# Patient Record
Sex: Male | Born: 1935 | Race: White | Hispanic: No | Marital: Married | Smoking: Former smoker
Health system: Southern US, Community
[De-identification: ages and names within clinical notes are randomized; demographics above are authoritative.]

## PROBLEM LIST (undated history)

## (undated) DIAGNOSIS — C801 Malignant (primary) neoplasm, unspecified: Secondary | ICD-10-CM

## (undated) DIAGNOSIS — R011 Cardiac murmur, unspecified: Secondary | ICD-10-CM

## (undated) DIAGNOSIS — G4730 Sleep apnea, unspecified: Secondary | ICD-10-CM

## (undated) DIAGNOSIS — Z95828 Presence of other vascular implants and grafts: Secondary | ICD-10-CM

## (undated) DIAGNOSIS — I10 Essential (primary) hypertension: Secondary | ICD-10-CM

## (undated) DIAGNOSIS — R1033 Periumbilical pain: Secondary | ICD-10-CM

## (undated) HISTORY — PX: APPENDECTOMY: SHX54

## (undated) HISTORY — DX: Periumbilical pain: R10.33

## (undated) HISTORY — DX: Malignant (primary) neoplasm, unspecified: C80.1

## (undated) HISTORY — DX: Cardiac murmur, unspecified: R01.1

## (undated) HISTORY — PX: SPLENECTOMY, TOTAL: SHX788

## (undated) HISTORY — PX: INGUINAL HERNIA REPAIR: SUR1180

---

## 2001-12-13 ENCOUNTER — Ambulatory Visit (HOSPITAL_COMMUNITY): Admission: RE | Admit: 2001-12-13 | Discharge: 2001-12-13 | Payer: Self-pay | Admitting: Cardiology

## 2002-05-09 ENCOUNTER — Ambulatory Visit (HOSPITAL_COMMUNITY): Admission: RE | Admit: 2002-05-09 | Discharge: 2002-05-09 | Payer: Self-pay | Admitting: Gastroenterology

## 2002-07-25 ENCOUNTER — Encounter: Admission: RE | Admit: 2002-07-25 | Discharge: 2002-07-25 | Payer: Self-pay

## 2004-07-15 ENCOUNTER — Ambulatory Visit (HOSPITAL_COMMUNITY): Admission: RE | Admit: 2004-07-15 | Discharge: 2004-07-15 | Payer: Self-pay | Admitting: Family Medicine

## 2009-01-06 ENCOUNTER — Emergency Department (HOSPITAL_COMMUNITY): Admission: EM | Admit: 2009-01-06 | Discharge: 2009-01-06 | Payer: Self-pay | Admitting: Family Medicine

## 2009-03-06 ENCOUNTER — Emergency Department (HOSPITAL_COMMUNITY): Admission: EM | Admit: 2009-03-06 | Discharge: 2009-03-06 | Payer: Self-pay | Admitting: Family Medicine

## 2010-05-27 ENCOUNTER — Encounter: Admission: RE | Admit: 2010-05-27 | Discharge: 2010-05-27 | Payer: Self-pay | Admitting: Family Medicine

## 2010-05-30 ENCOUNTER — Encounter: Admission: RE | Admit: 2010-05-30 | Discharge: 2010-05-30 | Payer: Self-pay | Admitting: Family Medicine

## 2010-06-25 HISTORY — PX: PANCREATECTOMY: SHX1019

## 2010-11-06 ENCOUNTER — Ambulatory Visit: Payer: Medicare Other | Attending: Radiation Oncology | Admitting: Radiation Oncology

## 2010-11-06 DIAGNOSIS — Z803 Family history of malignant neoplasm of breast: Secondary | ICD-10-CM | POA: Insufficient documentation

## 2010-11-06 DIAGNOSIS — C259 Malignant neoplasm of pancreas, unspecified: Secondary | ICD-10-CM | POA: Insufficient documentation

## 2010-11-06 DIAGNOSIS — I059 Rheumatic mitral valve disease, unspecified: Secondary | ICD-10-CM | POA: Insufficient documentation

## 2010-11-06 DIAGNOSIS — E119 Type 2 diabetes mellitus without complications: Secondary | ICD-10-CM | POA: Insufficient documentation

## 2010-11-06 DIAGNOSIS — Z79899 Other long term (current) drug therapy: Secondary | ICD-10-CM | POA: Insufficient documentation

## 2010-11-06 DIAGNOSIS — Z87891 Personal history of nicotine dependence: Secondary | ICD-10-CM | POA: Insufficient documentation

## 2010-11-06 DIAGNOSIS — Z510 Encounter for antineoplastic radiation therapy: Secondary | ICD-10-CM | POA: Insufficient documentation

## 2010-11-06 DIAGNOSIS — Z808 Family history of malignant neoplasm of other organs or systems: Secondary | ICD-10-CM | POA: Insufficient documentation

## 2010-11-06 DIAGNOSIS — Z85828 Personal history of other malignant neoplasm of skin: Secondary | ICD-10-CM | POA: Insufficient documentation

## 2010-11-06 DIAGNOSIS — R110 Nausea: Secondary | ICD-10-CM | POA: Insufficient documentation

## 2010-11-06 DIAGNOSIS — R630 Anorexia: Secondary | ICD-10-CM | POA: Insufficient documentation

## 2010-12-19 ENCOUNTER — Ambulatory Visit: Payer: Medicare Other | Attending: Radiation Oncology | Admitting: Radiation Oncology

## 2010-12-19 ENCOUNTER — Other Ambulatory Visit: Payer: Self-pay | Admitting: Radiation Oncology

## 2010-12-19 DIAGNOSIS — C252 Malignant neoplasm of tail of pancreas: Secondary | ICD-10-CM | POA: Insufficient documentation

## 2010-12-19 LAB — COMPREHENSIVE METABOLIC PANEL
ALT: 16 U/L (ref 0–53)
AST: 17 U/L (ref 0–37)
Albumin: 3.8 g/dL (ref 3.5–5.2)
Alkaline Phosphatase: 58 U/L (ref 39–117)
BUN: 18 mg/dL (ref 6–23)
CO2: 28 mEq/L (ref 19–32)
Calcium: 9.4 mg/dL (ref 8.4–10.5)
Chloride: 104 mEq/L (ref 96–112)
Creatinine, Ser: 0.94 mg/dL (ref 0.40–1.50)
Glucose, Bld: 200 mg/dL — ABNORMAL HIGH (ref 70–99)
Potassium: 4.3 mEq/L (ref 3.5–5.3)
Sodium: 139 mEq/L (ref 135–145)
Total Bilirubin: 0.7 mg/dL (ref 0.3–1.2)
Total Protein: 6.9 g/dL (ref 6.0–8.3)

## 2010-12-19 LAB — CBC WITH DIFFERENTIAL/PLATELET
BASO%: 0.2 % (ref 0.0–2.0)
Basophils Absolute: 0 10*3/uL (ref 0.0–0.1)
EOS%: 2.1 % (ref 0.0–7.0)
Eosinophils Absolute: 0.1 10*3/uL (ref 0.0–0.5)
HCT: 31.1 % — ABNORMAL LOW (ref 38.4–49.9)
HGB: 10.5 g/dL — ABNORMAL LOW (ref 13.0–17.1)
LYMPH%: 28.3 % (ref 14.0–49.0)
MCH: 31.6 pg (ref 27.2–33.4)
MCHC: 33.8 g/dL (ref 32.0–36.0)
MCV: 93.7 fL (ref 79.3–98.0)
MONO#: 0.8 10*3/uL (ref 0.1–0.9)
MONO%: 14 % (ref 0.0–14.0)
NEUT#: 3.1 10*3/uL (ref 1.5–6.5)
NEUT%: 55.4 % (ref 39.0–75.0)
Platelets: 689 10*3/uL — ABNORMAL HIGH (ref 140–400)
RBC: 3.32 10*6/uL — ABNORMAL LOW (ref 4.20–5.82)
RDW: 14.9 % — ABNORMAL HIGH (ref 11.0–14.6)
WBC: 5.7 10*3/uL (ref 4.0–10.3)
lymph#: 1.6 10*3/uL (ref 0.9–3.3)
nRBC: 0 % (ref 0–0)

## 2010-12-24 ENCOUNTER — Ambulatory Visit: Payer: Medicare Other | Attending: Radiation Oncology | Admitting: Radiation Oncology

## 2010-12-24 ENCOUNTER — Other Ambulatory Visit: Payer: Self-pay | Admitting: Radiation Oncology

## 2010-12-24 LAB — COMPREHENSIVE METABOLIC PANEL
Alkaline Phosphatase: 70 U/L (ref 39–117)
CO2: 28 mEq/L (ref 19–32)
Creatinine, Ser: 0.87 mg/dL (ref 0.40–1.50)
Glucose, Bld: 90 mg/dL (ref 70–99)
Sodium: 137 mEq/L (ref 135–145)
Total Bilirubin: 0.4 mg/dL (ref 0.3–1.2)
Total Protein: 6.8 g/dL (ref 6.0–8.3)

## 2010-12-24 LAB — CBC WITH DIFFERENTIAL/PLATELET
Basophils Absolute: 0 10*3/uL (ref 0.0–0.1)
EOS%: 3.9 % (ref 0.0–7.0)
LYMPH%: 24.6 % (ref 14.0–49.0)
MCH: 32 pg (ref 27.2–33.4)
MCV: 93.7 fL (ref 79.3–98.0)
MONO%: 18.1 % — ABNORMAL HIGH (ref 0.0–14.0)
Platelets: INCREASED 10*3/uL (ref 140–400)
RBC: 3.34 10*6/uL — ABNORMAL LOW (ref 4.20–5.82)
RDW: 15 % — ABNORMAL HIGH (ref 11.0–14.6)
nRBC: 0 % (ref 0–0)

## 2011-01-06 ENCOUNTER — Ambulatory Visit
Admission: RE | Admit: 2011-01-06 | Discharge: 2011-01-06 | Disposition: A | Discharge status: Home / Self Care | Payer: Medicare Other | Source: Ambulatory Visit | Attending: Radiation Oncology | Admitting: Radiation Oncology

## 2011-01-06 ENCOUNTER — Other Ambulatory Visit: Payer: Self-pay | Admitting: Radiation Oncology

## 2011-01-06 DIAGNOSIS — Z510 Encounter for antineoplastic radiation therapy: Secondary | ICD-10-CM | POA: Insufficient documentation

## 2011-01-06 DIAGNOSIS — C252 Malignant neoplasm of tail of pancreas: Secondary | ICD-10-CM | POA: Insufficient documentation

## 2011-01-06 LAB — COMPREHENSIVE METABOLIC PANEL
Alkaline Phosphatase: 68 U/L (ref 39–117)
BUN: 17 mg/dL (ref 6–23)
CO2: 27 mEq/L (ref 19–32)
Creatinine, Ser: 0.66 mg/dL (ref 0.40–1.50)
Glucose, Bld: 168 mg/dL — ABNORMAL HIGH (ref 70–99)
Sodium: 137 mEq/L (ref 135–145)
Total Bilirubin: 0.4 mg/dL (ref 0.3–1.2)
Total Protein: 6.4 g/dL (ref 6.0–8.3)

## 2011-01-06 LAB — CBC WITH DIFFERENTIAL/PLATELET
Basophils Absolute: 0 10*3/uL (ref 0.0–0.1)
Eosinophils Absolute: 0.3 10*3/uL (ref 0.0–0.5)
HCT: 31.8 % — ABNORMAL LOW (ref 38.4–49.9)
HGB: 10.7 g/dL — ABNORMAL LOW (ref 13.0–17.1)
LYMPH%: 11 % — ABNORMAL LOW (ref 14.0–49.0)
MCV: 100 fL — ABNORMAL HIGH (ref 79.3–98.0)
MONO#: 0.8 10*3/uL (ref 0.1–0.9)
MONO%: 13.5 % (ref 0.0–14.0)
NEUT#: 4.3 10*3/uL (ref 1.5–6.5)
NEUT%: 70.1 % (ref 39.0–75.0)
Platelets: 200 10*3/uL (ref 140–400)
RBC: 3.18 10*6/uL — ABNORMAL LOW (ref 4.20–5.82)
WBC: 6.1 10*3/uL (ref 4.0–10.3)

## 2011-01-10 NOTE — Op Note (Signed)
   NAME:  Philip Shaw, Philip Shaw                         ACCOUNT NO.:  [id number]   MEDICAL RECORD NO.:  [id number]                   PATIENT TYPE:  AMB   LOCATION:  ENDO                                 FACILITY:  [facility]   PHYSICIAN:  James L. Malon Kindle., M.D.          DATE OF BIRTH:  17-May-1935   DATE OF PROCEDURE:  05/09/2002  DATE OF DISCHARGE:                                 OPERATIVE REPORT   PROCEDURE:  Colonoscopy.   MEDICATIONS:  Fentanyl 75 mg, Versed 6 mg IV.   SCOPE:  Olympus pediatric video colonoscope.   INDICATION:  Colon cancer screening.   DESCRIPTION OF PROCEDURE:  The procedure had been explained to the patient  and consent obtained.  The patient in the left lateral decubitus position.  The Olympus pediatric video colonoscope was inserted and advanced under  direct visualization.  The prep was quite good.  The patient had mild  diverticulosis.  We were able to pass this area, advanced on to the right  colon.  The ileocecal valve and appendiceal orifice were seen.  The scope  was withdrawn.  The ascending colon, hepatic flexure, transverse colon,  splenic flexure, descending, and sigmoid colon were seen well and other than  mild diverticular disease, were normal.  No polyps or other lesions were  seen throughout the colon.  The scope was withdrawn to the rectum.  The  rectum was free of polyps.  The patient tolerated the procedure well, was  maintained on low-flow oxygen and pulse oximeter throughout the procedure.   ASSESSMENT:  1. Normal screening colonoscopy.  2. Mild diverticulosis.   PLAN:  Will give diverticulosis information sheet.  Will recommend yearly  Hemoccults and routine follow-up.  Will consider colonoscopy in 5-10 years  or for anemia or heme-positive stools.  See me back on an as needed basis.                                               James L. Malon Kindle., M.D.    Waldron Session  D:  05/09/2002  T:  05/09/2002  Job:  [id number]   cc:   Kizzie Furnish, M.D.

## 2011-01-10 NOTE — Cardiovascular Report (Signed)
[facility]  Patient:    Philip Shaw, Philip Shaw Visit Number: [id number] MRN: [id number]          Service Type: CAT Location: [facility] [id number] Attending Physician:  Loreli Dollar Dictated by:   Julieanne Manson, M.D. Proc. Date: 12/13/01 Admit Date:  12/13/2001 Discharge Date: 12/13/2001   CC:         Cardiopulmonary Laboratory  Kizzie Furnish, M.D., [place]   Cardiac Catheterization  INDICATIONS FOR PROCEDURE: The patient is a 76 year old male who has complained of left shoulder discomfort that sounds somewhat musculoskeletal in nature. He has multifocal PVCs on his resting ECG and a nuclear study was suggestive of inferior ischemia.  He is brought in for outpatient cardiac catheterization.  DESCRIPTION OF PROCEDURE: The patient was prepped and draped in the usual sterile fashion exposing the right groin.  Following local anesthetic with 1% Xylocaine, the Seldinger technique was employed and a 5 [place] introducer sheath was placed in the right femoral artery.  Selective right and left coronary arteriography and ventriculography in the RAO projection was performed.  COMPLICATIONS: None.  EQUIPMENT: The 5 French Judkins configuration catheters.  TOTAL CONTRAST: 85 cc.  RESULTS: 1. Hemodynamic monitoring: Central aortic pressure 175/91, left    ventricular pressure 177/15 with no significant aortic valve    gradient noted at the time of pullback. 2. Ventriculography: Ventriculography in the RAO projection using 25 cc    of contrast at 12 cc/sec. was associated with occasional PVCs. There    was normal left ventricular systolic function. Ejection fraction was    greater than 55%. Mitral valve prolapse without mitral regurgitation was    seen. The end-diastolic pressure was 18.  CORONARY ARTERIOGRAPHY: No calcification was noted on fluoroscopy. 1. Left main: Normal. 2. LAD: The LAD extended down across the apex of the heart. There was  minor    irregularities in the proximal LAD. There was a very large first diagonal    branch that bifurcated. This system was free of disease. 3. Circumflex: The circumflex had minimal irregularities proximal. There    was a very large OM vessel that bifurcated and was free of disease.    Ongoing circumflex was relatively small. 4. Right coronary artery: The right coronary artery was clearly a dominant    vessel. It was about 3.75 to 4.2 mm in diameter. It gave rise to a small    PDA and four posterolateral branches. This entire system was free of    disease.  CONCLUSIONS: 1. Normal left ventricular systolic function. 2. Mitral valve prolapse without mitral regurgitation. 3. Minimal irregularities in the proximal left anterior descending and    circumflex.  DISCUSSION: There is clearly nothing cardiac-wise to explain his shoulder pain. I suspect the nuclear portion of the stress test was false-positive and the PVCs were probably related to the mitral valve prolapse. The patient will be discharged to home with followup in my office tomorrow. Dictated by:   Julieanne Manson, M.D. Attending Physician:  Loreli Dollar DD:  12/13/01 TD:  12/13/01 Job: [id number]

## 2011-01-13 ENCOUNTER — Other Ambulatory Visit: Payer: Self-pay | Admitting: Radiation Oncology

## 2011-01-13 LAB — CBC WITH DIFFERENTIAL/PLATELET
Basophils Absolute: 0 10*3/uL (ref 0.0–0.1)
Eosinophils Absolute: 0.6 10*3/uL — ABNORMAL HIGH (ref 0.0–0.5)
HGB: 10.7 g/dL — ABNORMAL LOW (ref 13.0–17.1)
LYMPH%: 8.3 % — ABNORMAL LOW (ref 14.0–49.0)
MCV: 101.1 fL — ABNORMAL HIGH (ref 79.3–98.0)
MONO#: 1 10*3/uL — ABNORMAL HIGH (ref 0.1–0.9)
NEUT#: 4.3 10*3/uL (ref 1.5–6.5)
Platelets: 211 10*3/uL (ref 140–400)
RBC: 3.14 10*6/uL — ABNORMAL LOW (ref 4.20–5.82)
WBC: 6.4 10*3/uL (ref 4.0–10.3)

## 2011-01-13 LAB — COMPREHENSIVE METABOLIC PANEL
ALT: 23 U/L (ref 0–53)
CO2: 31 mEq/L (ref 19–32)
Calcium: 9.8 mg/dL (ref 8.4–10.5)
Chloride: 100 mEq/L (ref 96–112)
Creatinine, Ser: 0.72 mg/dL (ref 0.40–1.50)
Sodium: 138 mEq/L (ref 135–145)
Total Protein: 6.3 g/dL (ref 6.0–8.3)

## 2011-02-27 ENCOUNTER — Ambulatory Visit
Admission: RE | Admit: 2011-02-27 | Discharge: 2011-02-27 | Disposition: A | Discharge status: Home / Self Care | Payer: Medicare Other | Source: Ambulatory Visit | Attending: Radiation Oncology | Admitting: Radiation Oncology

## 2011-06-13 ENCOUNTER — Encounter (INDEPENDENT_AMBULATORY_CARE_PROVIDER_SITE_OTHER): Payer: Self-pay | Admitting: Surgery

## 2011-06-17 ENCOUNTER — Ambulatory Visit (INDEPENDENT_AMBULATORY_CARE_PROVIDER_SITE_OTHER): Payer: Medicare Other | Admitting: Surgery

## 2011-06-17 ENCOUNTER — Encounter (INDEPENDENT_AMBULATORY_CARE_PROVIDER_SITE_OTHER): Payer: Self-pay | Admitting: Surgery

## 2011-06-17 VITALS — BP 118/68 | HR 80 | Temp 96.8°F | Resp 18 | Ht 68.0 in | Wt 168.1 lb

## 2011-06-17 DIAGNOSIS — R1905 Periumbilic swelling, mass or lump: Secondary | ICD-10-CM | POA: Insufficient documentation

## 2011-06-17 NOTE — Patient Instructions (Signed)
Dr Molli Knock will call you with an appointment by tomorrow. If you don't hear from him by Friday call his office.

## 2011-06-17 NOTE — Progress Notes (Signed)
  CC: Umbilical hernia HPI: This patient comes down from his primary care to be evaluated for repair of an umbilical hernia. He says has been present for quite a while. Recently it has gotten a little bit more painful and perhaps a little bit bigger.  Of note, he had a distal pancreatectomy and splenectomy last year for a pancreatic cancer. Apparently an umbilical hernia was noted at that time. He was told to wait to have this repaired until he had finished all of his chemotherapy, which was completed this past August.   ROS: His ROS was positive for some abdominal pain at the umbilical area and some recently developed constipation.  MEDS: Current Outpatient Prescriptions  Medication Sig Dispense Refill  . aspirin 81 MG tablet Take 81 mg by mouth daily.        Marland Kitchen lisinopril (PRINIVIL,ZESTRIL) 20 MG tablet       . metFORMIN (GLUCOPHAGE) 850 MG tablet       . Multiple Vitamins-Minerals (CENTRUM PO) Take by mouth daily.        Marland Kitchen oxyCODONE (OXY IR/ROXICODONE) 5 MG immediate release tablet       . pravastatin (PRAVACHOL) 20 MG tablet       . ranitidine (ZANTAC) 150 MG tablet       . terazosin (HYTRIN) 2 MG capsule           ALLERGIES: No Known Allergies     PE General patient is alert, oriented, and healthy-appearing.  Abdomen: Is soft and basically nontender. However there is a hard tender mass at the umbilical area, basically just above the umbilicus. I am concerned that this is tumor. There is a well-healed left subcostal incision from his prior surgery. The abdomen is otherwise benign.  Data Reviewed I have reviewed the notes from his primary care physician's office.  Assessment I am concerned he has recurrent cancer. He is currently scheduled for an MRI in November. I talked his oncologist, Dr.Yacoub, at [facility], and they will arrange to see him and perhaps move up his MRI for further evaluation.  Plan At this point I will see him back if any elective surgery as needed.  However I think he is going to be best followed at [facility]

## 2011-07-23 ENCOUNTER — Telehealth: Payer: Self-pay | Admitting: Internal Medicine

## 2011-07-23 NOTE — Telephone Encounter (Signed)
Number for dtr Clent Ridges [phone].

## 2011-07-23 NOTE — Telephone Encounter (Signed)
Returned call from pt's dtr Clent Ridges and gv her new pt appt for 12/5 @ 9:30 am w/MM.

## 2011-07-30 ENCOUNTER — Other Ambulatory Visit: Payer: Self-pay | Admitting: Neurology

## 2011-07-30 ENCOUNTER — Ambulatory Visit (HOSPITAL_BASED_OUTPATIENT_CLINIC_OR_DEPARTMENT_OTHER): Payer: Non-veteran care | Admitting: Internal Medicine

## 2011-07-30 ENCOUNTER — Telehealth: Payer: Self-pay | Admitting: Internal Medicine

## 2011-07-30 ENCOUNTER — Encounter: Payer: Self-pay | Admitting: Internal Medicine

## 2011-07-30 ENCOUNTER — Ambulatory Visit: Payer: Non-veteran care

## 2011-07-30 ENCOUNTER — Other Ambulatory Visit (HOSPITAL_BASED_OUTPATIENT_CLINIC_OR_DEPARTMENT_OTHER): Payer: Non-veteran care | Admitting: Lab

## 2011-07-30 ENCOUNTER — Encounter (HOSPITAL_COMMUNITY): Payer: Self-pay | Admitting: Pharmacy Technician

## 2011-07-30 ENCOUNTER — Other Ambulatory Visit: Payer: Self-pay | Admitting: *Deleted

## 2011-07-30 VITALS — BP 105/59 | HR 57 | Temp 97.0°F | Ht 68.0 in | Wt 159.5 lb

## 2011-07-30 DIAGNOSIS — C259 Malignant neoplasm of pancreas, unspecified: Secondary | ICD-10-CM

## 2011-07-30 DIAGNOSIS — C252 Malignant neoplasm of tail of pancreas: Secondary | ICD-10-CM

## 2011-07-30 DIAGNOSIS — C779 Secondary and unspecified malignant neoplasm of lymph node, unspecified: Secondary | ICD-10-CM

## 2011-07-30 DIAGNOSIS — C787 Secondary malignant neoplasm of liver and intrahepatic bile duct: Secondary | ICD-10-CM

## 2011-07-30 DIAGNOSIS — R634 Abnormal weight loss: Secondary | ICD-10-CM

## 2011-07-30 DIAGNOSIS — C50919 Malignant neoplasm of unspecified site of unspecified female breast: Secondary | ICD-10-CM

## 2011-07-30 LAB — COMPREHENSIVE METABOLIC PANEL
ALT: 13 U/L (ref 0–53)
AST: 13 U/L (ref 0–37)
Albumin: 4 g/dL (ref 3.5–5.2)
BUN: 33 mg/dL — ABNORMAL HIGH (ref 6–23)
Calcium: 9.6 mg/dL (ref 8.4–10.5)
Chloride: 96 mEq/L (ref 96–112)
Potassium: 4.6 mEq/L (ref 3.5–5.3)

## 2011-07-30 LAB — CBC WITH DIFFERENTIAL/PLATELET
BASO%: 0.3 % (ref 0.0–2.0)
Basophils Absolute: 0 10*3/uL (ref 0.0–0.1)
EOS%: 2.2 % (ref 0.0–7.0)
HGB: 11.1 g/dL — ABNORMAL LOW (ref 13.0–17.1)
MCH: 32.3 pg (ref 27.2–33.4)
RDW: 13.3 % (ref 11.0–14.6)
WBC: 7.5 10*3/uL (ref 4.0–10.3)
lymph#: 0.7 10*3/uL — ABNORMAL LOW (ref 0.9–3.3)

## 2011-07-30 NOTE — Progress Notes (Signed)
REASON FOR CONSULTATION:  76 years old white male diagnosed with metastatic pancreatic carcinoma.  HPI Philip Shaw is a 76 y.o. male was past medical history significant for hypertension diabetes mellitus hypercholesterolemia as well as history of kidney stone. The patient was diagnosed with stage IIIB(T3, N1, M0) pancreatic tail adenocarcinoma in November of 2011. He is status post, #1 distal pancreatectomy and splenectomy on 06/25/2010 #2 adjuvant chemotherapy with gemcitabine started 08/20/2010 and completed 2 cycles on 12/02/2010. #3 status post concurrent chemoradiation with Xeloda from 12/15/2010 on 10 01/24/2011. #4 status post 2 more cycles of adjuvant gemcitabine with gemcitabine completed on 04/04/2011. The patient mentions that in early 2012 he has been complaining of poor appetite, lack of taste and abdominal discomfort as well as back pain. He was seen initially in May of 2011 by a chiropractor for his back pain but no significant improvement. He was seen then by his primary care physician and treated for GERD again with no significant improvement. On 05/27/2010, the patient had CT of the abdomenand pelvis performaned which showed 2.5 cm hypodense lesion within the pancreatic tail. This is concerning for pancreatic neoplasm. Given calcifications elsewhere the pancreas, I cannot exclude that this is a complex pseudocyst. This was followed by MRI of the abdomen with and without contrast on 05/31/2010 and it showed within the tail of the pancreas there is a mass which is slightly T2 hyperintense compared to adjacent pancreatic parenchyma. This mass measures 3.4 x 3.1 cm. There is mild peripheral enhancement of this structure which is best seen on the 90-minute delayed images. Stranding into the peripancreatic fat is identified adjacent to this mass. There is no significant vascular involvement.  There is no upper abdominal adenopathy identified. The patient was referred to Dr. Flonnie Hailstone at [facility]. Biopsy of the pancreatic mass was consistent with pancreatic adenocarcinoma. The patient then underwent surgical resection as mentioned above by Dr. Flonnie Hailstone which was complicated by prolonged infection and surgical leak. He was unable to start any adjuvant treatment until February of 2012. He did fine with his treatment but was noted recently to have an enlarging mass at the umbilical area. MRI of the abdomen on 06/24/2011 showed overall findings concerning for and prevent progression of disease as compared to 04/08/2011 was enlargement of the liver lesions and increased abnormal enhancement in the paraumbilical soft tissues and a right rectus abdominis muscle which is suspicious for metastatic disease.  Biopsy of the paraumbilical mass showed metastatic pancreatic adenocarcinoma. The patient was seen by his medical oncologist at [facility], Dr. Molli Knock, who discussed with the patient several treatment options, including treatment with FOLFOX versus Xelox. The patient was interested in the treatment but he prefers to take it close to home and [place]. He came today to establish care with me and also to start his chemotherapy at the [facility]. The patient is feeling fine today except for pain in the abdomen close to the umbilical mass. He is currently on oxycodone 5 mg by mouth 3-4 times a day but is not enough to control his pain. He also continues to complain of poor appetite and weight loss. No other significant complaints. He denied having any significant chest pain, shortness of breath, cough, hemoptysis, no nausea or vomiting.  @SFHPI @  Past Medical History  Diagnosis Date  . Cancer   . Heart murmur   . Abdominal pain, periumbilical   . Constipation     Past Surgical History  Procedure Date  .  Appendectomy 76-50 years old  . Inguinal hernia repair as infant, age 23    LIH- infant, RIH age 74  . Pancreatectomy 06/2010    with splenectomy    Family  History  Problem Relation Age of Onset  . Breast cancer Sister   . Throat cancer Father     Social History History  Substance Use Topics  . Smoking status: Former Smoker    Quit date: 06/13/1971  . Smokeless tobacco: Not on file  . Alcohol Use: No    No Known Allergies  Current Outpatient Prescriptions  Medication Sig Dispense Refill  . aspirin 81 MG tablet Take 81 mg by mouth daily.        Tery Sanfilippo Sodium (COLACE PO) Take 2 tablets by mouth 2 (two) times daily.        Marland Kitchen lisinopril (PRINIVIL,ZESTRIL) 20 MG tablet       . metFORMIN (GLUCOPHAGE) 850 MG tablet       . mirtazapine (REMERON) 30 MG tablet Take 30 mg by mouth at bedtime.        . Multiple Vitamins-Minerals (CENTRUM PO) Take by mouth daily.        . ondansetron (ZOFRAN) 8 MG tablet Take by mouth every 8 (eight) hours as needed.        Marland Kitchen oxyCODONE (OXY IR/ROXICODONE) 5 MG immediate release tablet       . pravastatin (PRAVACHOL) 20 MG tablet Take 20 mg by mouth daily.       . ranitidine (ZANTAC) 150 MG tablet       . terazosin (HYTRIN) 2 MG capsule         Review of Systems  A comprehensive review of systems was negative except for: Constitutional: positive for anorexia, fatigue and weight loss Respiratory: positive for dyspnea on exertion Gastrointestinal: positive for abdominal pain and constipation  Physical Exam  ZOX:WRUEA, healthy, no distress and malnourished SKIN: skin color, texture, turgor are normal HEAD: Normocephalic EYES: normal EARS: deferred OROPHARYNX:no exudate, no erythema and lips, buccal mucosa, and tongue normal  NECK: supple, no adenopathy LYMPH:  no palpable lymphadenopathy LUNGS: clear to auscultation , and palpation HEART: regular rate & rhythm, no murmurs and no gallops ABDOMEN:abdomen soft, non-tender and normal bowel sounds EXTREMITIES:no joint deformities, effusion, or inflammation, no edema, no skin discoloration, no clubbing, no cyanosis  NEURO: alert & oriented x 3 with  fluent speech, no focal motor/sensory deficits, gait normal   Assessment: This is a very pleasant 76 years old white male with metastatic pancreatic adenocarcinoma, now with metastatic disease to the liver and the abdominal wall. I have a lengthy discussion with the patient and his family today about his disease, stage, prognosis and treatment options. I gave the patient the option of palliative care and hospice versus systemic chemotherapy with use of Xelox or FOLFOX. I discussed with the patient adverse effect of both chemotherapy regimens, including but not limited to alopecia, myelosuppression, nausea and vomiting, peripheral neuropathy, liver or in dysfunction. The patient is interested in treatment. He prefers to proceed with the FOLFOX regimen.  Plan: #1 I referred the patient to interventional radiology for a Port-A-Cath placement. #2 I will arrange for the patient to have a chemotherapy education class before the first cycle of his treatment. #3 I referred the patient for dietitian evaluation because of his poor appetite and weight loss. #4 The patient expected to start the first cycle of his treatment with FOLFOX next week. #5 for pain management I started the  patient today on OxyContin 20 mg by mouth every 6 hours in addition to oxycodone 5 mg by mouth Q4 hours as needed for pain.  #6 the patient was given prescription for Emla Cream. #7 he would come back for followup visit in 2 weeks for evaluation and management any adverse effect of his chemotherapy.   All questions were answered. The patient knows to call the clinic with any problems, questions or concerns. We can certainly see the patient much sooner if necessary.  Thank you so much for allowing me to participate in the care of Philip Shaw. I will continue to follow up the patient with you and assist in his care.  I spent 35 minutes counseling the patient face to face. The total time spent in the appointment was 60  minutes.   Icis Budreau K. 07/30/2011, 11:35 AM

## 2011-07-30 NOTE — Telephone Encounter (Signed)
S/w the pt and he is aware of the appt on 12/6/20112 for the chemo educ class and to pick up an appt calendar for the rest of his appts.

## 2011-07-31 ENCOUNTER — Other Ambulatory Visit (HOSPITAL_COMMUNITY): Payer: Self-pay | Admitting: *Deleted

## 2011-07-31 ENCOUNTER — Other Ambulatory Visit: Payer: Non-veteran care

## 2011-07-31 ENCOUNTER — Encounter: Payer: Self-pay | Admitting: *Deleted

## 2011-08-01 ENCOUNTER — Ambulatory Visit (HOSPITAL_COMMUNITY)
Admission: RE | Admit: 2011-08-01 | Discharge: 2011-08-01 | Disposition: A | Discharge status: Home / Self Care | Payer: Non-veteran care | Source: Ambulatory Visit | Attending: Internal Medicine | Admitting: Internal Medicine

## 2011-08-01 ENCOUNTER — Inpatient Hospital Stay (HOSPITAL_COMMUNITY): Admission: RE | Admit: 2011-08-01 | Payer: Non-veteran care | Source: Ambulatory Visit

## 2011-08-01 ENCOUNTER — Ambulatory Visit (HOSPITAL_COMMUNITY): Payer: Non-veteran care

## 2011-08-01 ENCOUNTER — Other Ambulatory Visit: Payer: Self-pay | Admitting: Internal Medicine

## 2011-08-01 DIAGNOSIS — Z79899 Other long term (current) drug therapy: Secondary | ICD-10-CM | POA: Insufficient documentation

## 2011-08-01 DIAGNOSIS — C259 Malignant neoplasm of pancreas, unspecified: Secondary | ICD-10-CM

## 2011-08-01 DIAGNOSIS — Z7982 Long term (current) use of aspirin: Secondary | ICD-10-CM | POA: Insufficient documentation

## 2011-08-01 DIAGNOSIS — Z87891 Personal history of nicotine dependence: Secondary | ICD-10-CM | POA: Insufficient documentation

## 2011-08-01 MED ORDER — MIDAZOLAM HCL 5 MG/5ML IJ SOLN
INTRAMUSCULAR | Status: AC | PRN
  Administered 2011-08-01: 2 mg via INTRAVENOUS

## 2011-08-01 MED ORDER — SODIUM CHLORIDE 0.9 % IV SOLN: INTRAVENOUS | Status: DC

## 2011-08-01 MED ORDER — CEFAZOLIN SODIUM 1-5 GM-% IV SOLN
1.0000 g | INTRAVENOUS | Status: AC
  Administered 2011-08-01: 1 g via INTRAVENOUS
  Filled 2011-08-01: qty 50

## 2011-08-01 MED ORDER — FENTANYL CITRATE 0.05 MG/ML IJ SOLN
INTRAMUSCULAR | Status: AC | PRN
  Administered 2011-08-01: 100 ug via INTRAVENOUS

## 2011-08-01 MED ORDER — HEPARIN SOD (PORK) LOCK FLUSH 100 UNIT/ML IV SOLN
500.0000 [IU] | Freq: Once | INTRAVENOUS | Status: AC
  Administered 2011-08-01: 500 [IU] via INTRAVENOUS

## 2011-08-01 NOTE — ED Notes (Signed)
Patient denies pain and is resting comfortably.  

## 2011-08-01 NOTE — H&P (Signed)
Philip Shaw is an 76 y.o. male.   Chief Complaint: Port a cath placement  HPI: Pleasant 76 yo male with history of pancreatic CA. Here for portacath placement today for chemotherapy.   Past Medical History  Diagnosis Date  . Cancer   . Heart murmur   . Abdominal pain, periumbilical   . Constipation     Past Surgical History  Procedure Date  . Appendectomy 47-29 years old  . Inguinal hernia repair as infant, age 75    LIH- infant, RIH age 33  . Pancreatectomy 06/2010    with splenectomy    Family History  Problem Relation Age of Onset  . Breast cancer Sister   . Throat cancer Father    Social History:  reports that he quit smoking about 40 years ago. He does not have any smokeless tobacco history on file. He reports that he does not drink alcohol or use illicit drugs.  Allergies: No Known Allergies  Medications Prior to Admission  Medication Sig Dispense Refill  . aspirin 81 MG tablet Take 81 mg by mouth every morning.       Tery Sanfilippo Sodium (COLACE PO) Take 2 tablets by mouth 2 (two) times daily.        Marland Kitchen lisinopril (PRINIVIL,ZESTRIL) 20 MG tablet Take 20 mg by mouth every morning.       . metFORMIN (GLUCOPHAGE) 850 MG tablet Take 850 mg by mouth 2 (two) times daily with a meal.       . mirtazapine (REMERON) 30 MG tablet Take 30 mg by mouth at bedtime.       . Multiple Vitamins-Minerals (CENTRUM PO) Take 1 tablet by mouth daily.       . ondansetron (ZOFRAN) 8 MG tablet Take 8 mg by mouth every 8 (eight) hours as needed. NAUSEA       . oxyCODONE (OXY IR/ROXICODONE) 5 MG immediate release tablet Take 5-10 mg by mouth every 6 (six) hours as needed. PAIN       . pravastatin (PRAVACHOL) 20 MG tablet Take 20 mg by mouth at bedtime.       . ranitidine (ZANTAC) 150 MG tablet Take 150 mg by mouth 2 (two) times daily.       Marland Kitchen terazosin (HYTRIN) 2 MG capsule Take 2 mg by mouth at bedtime.        No current facility-administered medications on file as of 08/01/2011.    No  results found for this or any previous visit (from the past 48 hour(s)). No results found.  Review of Systems  Constitutional: Negative for fever and chills.  Cardiovascular: Positive for palpitations. Negative for chest pain and orthopnea.       Irregular heart rate for years per pt.   Gastrointestinal: Positive for nausea and abdominal pain. Negative for vomiting.       Occasional mild epigastric pain.  Endo/Heme/Allergies: Does not bruise/bleed easily.    There were no vitals taken for this visit. Physical Exam Vital signs pending. Heent - unremarkable - airway 1 Heart - irregular irregular Lungs - clear Abd - mil;d epigastric tenderness to palpation.  Assessment/Plan Portacath placement today by Dr Lowella Dandy for chemotherapy for pancreatic CA. Informed consent obtained.  Philip Shaw 08/01/2011, 11:52 AM

## 2011-08-01 NOTE — Procedures (Signed)
Placement of right portacath.  Tip in SVC.   Ready to use.  No immediate complication.

## 2011-08-01 NOTE — H&P (Signed)
Agree with PA note. 

## 2011-08-01 NOTE — ED Notes (Signed)
Patient is resting comfortably. 

## 2011-08-04 ENCOUNTER — Ambulatory Visit: Payer: Non-veteran care | Admitting: Nutrition

## 2011-08-04 ENCOUNTER — Ambulatory Visit (HOSPITAL_BASED_OUTPATIENT_CLINIC_OR_DEPARTMENT_OTHER): Payer: Non-veteran care

## 2011-08-04 ENCOUNTER — Other Ambulatory Visit: Payer: Self-pay | Admitting: Internal Medicine

## 2011-08-04 ENCOUNTER — Other Ambulatory Visit (HOSPITAL_BASED_OUTPATIENT_CLINIC_OR_DEPARTMENT_OTHER): Payer: Non-veteran care | Admitting: Lab

## 2011-08-04 VITALS — BP 115/54 | HR 52 | Temp 98.2°F

## 2011-08-04 DIAGNOSIS — C252 Malignant neoplasm of tail of pancreas: Secondary | ICD-10-CM

## 2011-08-04 DIAGNOSIS — C50919 Malignant neoplasm of unspecified site of unspecified female breast: Secondary | ICD-10-CM

## 2011-08-04 DIAGNOSIS — C787 Secondary malignant neoplasm of liver and intrahepatic bile duct: Secondary | ICD-10-CM

## 2011-08-04 DIAGNOSIS — C259 Malignant neoplasm of pancreas, unspecified: Secondary | ICD-10-CM

## 2011-08-04 DIAGNOSIS — Z5111 Encounter for antineoplastic chemotherapy: Secondary | ICD-10-CM

## 2011-08-04 DIAGNOSIS — C779 Secondary and unspecified malignant neoplasm of lymph node, unspecified: Secondary | ICD-10-CM

## 2011-08-04 LAB — CBC WITH DIFFERENTIAL/PLATELET
EOS%: 3.4 % (ref 0.0–7.0)
Eosinophils Absolute: 0.3 10*3/uL (ref 0.0–0.5)
LYMPH%: 10.4 % — ABNORMAL LOW (ref 14.0–49.0)
MCH: 31 pg (ref 27.2–33.4)
MCHC: 33.8 g/dL (ref 32.0–36.0)
MCV: 91.8 fL (ref 79.3–98.0)
MONO%: 12.3 % (ref 0.0–14.0)
Platelets: 327 10*3/uL (ref 140–400)
RBC: 3.29 10*6/uL — ABNORMAL LOW (ref 4.20–5.82)
RDW: 13.2 % (ref 11.0–14.6)
nRBC: 0 % (ref 0–0)

## 2011-08-04 LAB — COMPREHENSIVE METABOLIC PANEL
ALT: 12 U/L (ref 0–53)
Alkaline Phosphatase: 83 U/L (ref 39–117)
CO2: 24 mEq/L (ref 19–32)
Creatinine, Ser: 0.9 mg/dL (ref 0.50–1.35)
Sodium: 131 mEq/L — ABNORMAL LOW (ref 135–145)
Total Bilirubin: 0.3 mg/dL (ref 0.3–1.2)
Total Protein: 5.9 g/dL — ABNORMAL LOW (ref 6.0–8.3)

## 2011-08-04 MED ORDER — ONDANSETRON 8 MG/50ML IVPB (CHCC)
8.0000 mg | Freq: Once | INTRAVENOUS | Status: AC
  Administered 2011-08-04: 8 mg via INTRAVENOUS

## 2011-08-04 MED ORDER — LEUCOVORIN CALCIUM INJECTION 350 MG
400.0000 mg/m2 | Freq: Once | INTRAVENOUS | Status: AC
  Administered 2011-08-04: 744 mg via INTRAVENOUS
  Filled 2011-08-04: qty 37.2

## 2011-08-04 MED ORDER — FLUOROURACIL CHEMO INJECTION 2.5 GM/50ML
400.0000 mg/m2 | Freq: Once | INTRAVENOUS | Status: AC
  Administered 2011-08-04: 750 mg via INTRAVENOUS
  Filled 2011-08-04: qty 15

## 2011-08-04 MED ORDER — DEXTROSE 5 % IV SOLN
Freq: Once | INTRAVENOUS | Status: AC
  Administered 2011-08-04: 13:00:00 via INTRAVENOUS

## 2011-08-04 MED ORDER — SODIUM CHLORIDE 0.9 % IV SOLN
2400.0000 mg/m2 | INTRAVENOUS | Status: DC
  Administered 2011-08-04: 4450 mg via INTRAVENOUS
  Filled 2011-08-04: qty 89

## 2011-08-04 MED ORDER — OXALIPLATIN CHEMO INJECTION 100 MG/20ML
85.0000 mg/m2 | Freq: Once | INTRAVENOUS | Status: AC
  Administered 2011-08-04: 160 mg via INTRAVENOUS
  Filled 2011-08-04: qty 32

## 2011-08-04 MED ORDER — DEXAMETHASONE SODIUM PHOSPHATE 10 MG/ML IJ SOLN
10.0000 mg | Freq: Once | INTRAMUSCULAR | Status: AC
  Administered 2011-08-04: 10 mg via INTRAVENOUS

## 2011-08-04 NOTE — Assessment & Plan Note (Signed)
Philip Shaw is a 76 year old male patient of Dr. Arbutus Ped diagnosed with metastatic pancreatic cancer.  MEDICAL HISTORY INCLUDES:  Hypertension, diabetes, hypercholesterolemia, concurrent chemoradiation therapy, pancreatectomy and a kidney stone.  MEDICATIONS INCLUDE:  Colace, Glucophage, Remeron, multivitamin, Zofran, Zantac, and oxycodone.  LABS:  Sodium 132, glucose 121, BUN 33, creatinine of 1.37.  HEIGHT:  67 inches. WEIGHT:  159 pounds. USUAL BODY WEIGHT:  1 year ago, 208 pounds. BMI:  24.9.  Patient reports poor appetite and taste alterations.  He continues to have issues with pain, although his pain medication has been adjusted and he feels like his pain has improved.  He does complain of  constipation.  He drinks Glucerna 4-5 daily.  He also is trying to eat meals with foods he tolerates.  NUTRITION DIAGNOSIS:  Unintended weight loss related to diagnosis of metastatic pancreatic cancer and associated treatments as evidenced by 5% weight loss in 6 weeks and a 24% weight loss in the last year.  INTERVENTION:  I educated the patient and daughter on strategies for increasing oral intake in small amounts throughout the day.  I have encouraged the patient to switch from Glucerna to Ensure Plus or Boost Plus to provide an additional 150 calories per bottle.  He is to continue to monitor his blood sugars; however, his b;ood sugars have been controlled. He is encouraged to continue to consume calorically-dense foods throughout the day, trying to eat or drink every 2 hours as tolerated.  I have provided strategies for dealing with taste alterations as well as suggestions for increasing calories in the foods he currently consumes.  I provided fact sheets for the patient and daughter and answered their questions.  MONITORING/EVALUATION (GOALS):  The patient will tolerate increased calories and protein to minimize further weight loss and to improve quality of life.  NEXT VISIT:  Wednesday, December  12, for followup on supplement use.    ______________________________ Zenovia Jarred, RD, LDN Clinical Nutrition Specialist BN/MEDQ  D:  08/04/2011  T:  08/04/2011  Job:  [id number]

## 2011-08-05 ENCOUNTER — Telehealth: Payer: Self-pay | Admitting: *Deleted

## 2011-08-05 NOTE — Telephone Encounter (Signed)
INSTRUCTED PT. CONCERNING A BOWEL REGIMEN. NO NAUSEA OR VOMITING. PT. IS FORCING FLUIDS AND EATING. NO MOUTH ISSUES. INFORMED PT. TO CALL THIS OFFICE IF ANY PROBLEMS OR QUESTIONS ARISE. PT. VOICES UNDERSTANDING.

## 2011-08-06 ENCOUNTER — Ambulatory Visit: Payer: Non-veteran care | Admitting: Nutrition

## 2011-08-06 ENCOUNTER — Ambulatory Visit (HOSPITAL_BASED_OUTPATIENT_CLINIC_OR_DEPARTMENT_OTHER): Payer: Non-veteran care

## 2011-08-06 ENCOUNTER — Encounter: Payer: Non-veteran care | Admitting: Nutrition

## 2011-08-06 ENCOUNTER — Telehealth: Payer: Self-pay | Admitting: *Deleted

## 2011-08-06 VITALS — BP 139/78 | HR 97 | Temp 97.8°F

## 2011-08-06 DIAGNOSIS — Z469 Encounter for fitting and adjustment of unspecified device: Secondary | ICD-10-CM

## 2011-08-06 DIAGNOSIS — C259 Malignant neoplasm of pancreas, unspecified: Secondary | ICD-10-CM

## 2011-08-06 MED ORDER — SODIUM CHLORIDE 0.9 % IJ SOLN
10.0000 mL | INTRAMUSCULAR | Status: DC | PRN
  Administered 2011-08-06: 10 mL
  Filled 2011-08-06: qty 10

## 2011-08-06 MED ORDER — HEPARIN SOD (PORK) LOCK FLUSH 100 UNIT/ML IV SOLN
500.0000 [IU] | Freq: Once | INTRAVENOUS | Status: AC | PRN
  Administered 2011-08-06: 500 [IU]
  Filled 2011-08-06: qty 5

## 2011-08-06 NOTE — Telephone Encounter (Signed)
Philip Shaw says he feels weak and tired but better today than yesterday.  He is eating and drinking well.  Yesterday had abdominal cramping.  Took M.O.M. Last evening and has had results.  Encouraged to drink at least 64 oz or more water to help relieve constipation.  Denies questions at this time.

## 2011-08-06 NOTE — Telephone Encounter (Signed)
Message copied by Augusto Garbe on Wed Aug 06, 2011 12:06 PM ------      Message from: Stephania Fragmin      Created: Tue Aug 05, 2011  9:04 AM      Regarding: Chemo follow up call       Folfox/ Dr Arbutus Ped

## 2011-08-06 NOTE — Patient Instructions (Signed)
Call MD for problems 

## 2011-08-06 NOTE — Progress Notes (Signed)
I met with the patient briefly to allow him to sample a nutritional product called Magic Cup.  This product has 290 calories and 9 grams of protein in a 4-ounce serving, so it is a concentrated source of calories.  The patient's daughter had inquired about this product and its availability.  This product is very difficult for the patient to find online or in a store; however, if the patient likes this product, I am in the process of attempting to make arrangements for our food service department to order this product for the patient.  He will taste this product today and let me know what he thinks, and we will proceed from there.  NUTRITION DIAGNOSIS:  Unintended weight loss continues.  INTERVENTION:  The patient will try nutritional supplements, to include Magic Cup, to see if he can increase his overall calorie level.  MONITORING/EVALUATION/GOALS:  The patient will tolerate increased calories and protein to minimize further weight loss and to improve quality of life.  NEXT VISIT:  I have not scheduled a followup with the patient at this time.  I will await his decision on purchasing this oral nutrition supplements.    ______________________________ Zenovia Jarred, RD, LDN Clinical Nutrition Specialist BN/MEDQ  D:  08/06/2011  T:  08/06/2011  Job:  [id number]

## 2011-08-11 ENCOUNTER — Other Ambulatory Visit (HOSPITAL_BASED_OUTPATIENT_CLINIC_OR_DEPARTMENT_OTHER): Payer: Non-veteran care | Admitting: Lab

## 2011-08-11 ENCOUNTER — Other Ambulatory Visit: Payer: Self-pay | Admitting: Internal Medicine

## 2011-08-11 DIAGNOSIS — C259 Malignant neoplasm of pancreas, unspecified: Secondary | ICD-10-CM

## 2011-08-11 LAB — CBC WITH DIFFERENTIAL/PLATELET
Eosinophils Absolute: 0.2 10*3/uL (ref 0.0–0.5)
HCT: 31.1 % — ABNORMAL LOW (ref 38.4–49.9)
LYMPH%: 15.5 % (ref 14.0–49.0)
MCHC: 34.3 g/dL (ref 32.0–36.0)
MCV: 93.8 fL (ref 79.3–98.0)
MONO#: 0.6 10*3/uL (ref 0.1–0.9)
MONO%: 9.3 % (ref 0.0–14.0)
NEUT#: 4.5 10*3/uL (ref 1.5–6.5)
NEUT%: 71.3 % (ref 39.0–75.0)
Platelets: 353 10*3/uL (ref 140–400)
RBC: 3.32 10*6/uL — ABNORMAL LOW (ref 4.20–5.82)
WBC: 6.3 10*3/uL (ref 4.0–10.3)

## 2011-08-11 LAB — COMPREHENSIVE METABOLIC PANEL
Alkaline Phosphatase: 91 U/L (ref 39–117)
CO2: 26 mEq/L (ref 19–32)
Creatinine, Ser: 0.9 mg/dL (ref 0.50–1.35)
Glucose, Bld: 201 mg/dL — ABNORMAL HIGH (ref 70–99)
Total Bilirubin: 0.4 mg/dL (ref 0.3–1.2)

## 2011-08-15 ENCOUNTER — Encounter: Payer: Self-pay | Admitting: Internal Medicine

## 2011-08-18 ENCOUNTER — Other Ambulatory Visit: Payer: Self-pay | Admitting: Internal Medicine

## 2011-08-18 ENCOUNTER — Other Ambulatory Visit (HOSPITAL_BASED_OUTPATIENT_CLINIC_OR_DEPARTMENT_OTHER): Payer: Non-veteran care | Admitting: Lab

## 2011-08-18 ENCOUNTER — Ambulatory Visit (HOSPITAL_BASED_OUTPATIENT_CLINIC_OR_DEPARTMENT_OTHER): Payer: Non-veteran care | Admitting: Internal Medicine

## 2011-08-18 ENCOUNTER — Ambulatory Visit (HOSPITAL_BASED_OUTPATIENT_CLINIC_OR_DEPARTMENT_OTHER): Payer: Non-veteran care

## 2011-08-18 ENCOUNTER — Telehealth: Payer: Self-pay | Admitting: Internal Medicine

## 2011-08-18 VITALS — BP 142/53 | HR 57 | Temp 98.6°F

## 2011-08-18 DIAGNOSIS — C787 Secondary malignant neoplasm of liver and intrahepatic bile duct: Secondary | ICD-10-CM

## 2011-08-18 DIAGNOSIS — C50919 Malignant neoplasm of unspecified site of unspecified female breast: Secondary | ICD-10-CM

## 2011-08-18 DIAGNOSIS — K5900 Constipation, unspecified: Secondary | ICD-10-CM

## 2011-08-18 DIAGNOSIS — Z5111 Encounter for antineoplastic chemotherapy: Secondary | ICD-10-CM

## 2011-08-18 DIAGNOSIS — C252 Malignant neoplasm of tail of pancreas: Secondary | ICD-10-CM

## 2011-08-18 DIAGNOSIS — C259 Malignant neoplasm of pancreas, unspecified: Secondary | ICD-10-CM

## 2011-08-18 LAB — CBC WITH DIFFERENTIAL/PLATELET
Basophils Absolute: 0 10*3/uL (ref 0.0–0.1)
Eosinophils Absolute: 0.2 10*3/uL (ref 0.0–0.5)
HCT: 30.6 % — ABNORMAL LOW (ref 38.4–49.9)
HGB: 10 g/dL — ABNORMAL LOW (ref 13.0–17.1)
LYMPH%: 10.5 % — ABNORMAL LOW (ref 14.0–49.0)
MCV: 93 fL (ref 79.3–98.0)
MONO#: 1 10*3/uL — ABNORMAL HIGH (ref 0.1–0.9)
NEUT#: 5.4 10*3/uL (ref 1.5–6.5)
NEUT%: 72 % (ref 39.0–75.0)
Platelets: 274 10*3/uL (ref 140–400)
RBC: 3.29 10*6/uL — ABNORMAL LOW (ref 4.20–5.82)
WBC: 7.4 10*3/uL (ref 4.0–10.3)
nRBC: 0 % (ref 0–0)

## 2011-08-18 LAB — COMPREHENSIVE METABOLIC PANEL
BUN: 22 mg/dL (ref 6–23)
CO2: 25 mEq/L (ref 19–32)
Calcium: 8.5 mg/dL (ref 8.4–10.5)
Chloride: 98 mEq/L (ref 96–112)
Creatinine, Ser: 0.87 mg/dL (ref 0.50–1.35)
Glucose, Bld: 247 mg/dL — ABNORMAL HIGH (ref 70–99)

## 2011-08-18 MED ORDER — FLUOROURACIL CHEMO INJECTION 2.5 GM/50ML
400.0000 mg/m2 | Freq: Once | INTRAVENOUS | Status: AC
  Administered 2011-08-18: 750 mg via INTRAVENOUS
  Filled 2011-08-18: qty 15

## 2011-08-18 MED ORDER — LEUCOVORIN CALCIUM INJECTION 350 MG
400.0000 mg/m2 | Freq: Once | INTRAVENOUS | Status: AC
  Administered 2011-08-18: 744 mg via INTRAVENOUS
  Filled 2011-08-18: qty 37.2

## 2011-08-18 MED ORDER — DEXAMETHASONE SODIUM PHOSPHATE 10 MG/ML IJ SOLN
10.0000 mg | Freq: Once | INTRAMUSCULAR | Status: AC
  Administered 2011-08-18: 10 mg via INTRAVENOUS

## 2011-08-18 MED ORDER — DEXTROSE 5 % IV SOLN
Freq: Once | INTRAVENOUS | Status: AC
  Administered 2011-08-18: 09:00:00 via INTRAVENOUS

## 2011-08-18 MED ORDER — ONDANSETRON 8 MG/50ML IVPB (CHCC)
8.0000 mg | Freq: Once | INTRAVENOUS | Status: AC
  Administered 2011-08-18: 8 mg via INTRAVENOUS

## 2011-08-18 MED ORDER — OXALIPLATIN CHEMO INJECTION 100 MG/20ML
85.0000 mg/m2 | Freq: Once | INTRAVENOUS | Status: AC
  Administered 2011-08-18: 160 mg via INTRAVENOUS
  Filled 2011-08-18: qty 32

## 2011-08-18 MED ORDER — SODIUM CHLORIDE 0.9 % IV SOLN
2400.0000 mg/m2 | INTRAVENOUS | Status: DC
  Administered 2011-08-18: 4450 mg via INTRAVENOUS
  Filled 2011-08-18: qty 89

## 2011-08-18 NOTE — Telephone Encounter (Signed)
CALLED PT TO MAKE HER AWARE OF 11/2011 FLUSH APPT,PT WILL STOP BY ON 09/2011 APPT FOR PRINT OUT     [facility]

## 2011-08-18 NOTE — Progress Notes (Signed)
[facility] OFFICE PROGRESS NOTE  Philip Ravel, MD Dr. Sharman Crate Hamrick [address] [place] [address]  DIAGNOSIS: Metastatic pancreatic adenocarcinoma, now with metastatic disease to the liver and the abdominal wall, initially diagnosed with stage IIIB(T3, N1, M0) pancreatic tail adenocarcinoma in November of 2011.  PRIOR THERAPY: #1 distal pancreatectomy and splenectomy on 06/25/2010  #2 adjuvant chemotherapy with gemcitabine started 08/20/2010 and completed 2 cycles on 12/02/2010.  #3 status post concurrent chemoradiation with Xeloda from 12/15/2010 on 10 01/24/2011.  #4 status post 2 more cycles of adjuvant gemcitabine with gemcitabine completed on 04/04/2011.  CURRENT THERAPY: FOLFOX giving every 2 weeks status post 1 cycle.   INTERVAL HISTORY: Philip Shaw 76 y.o. male returns to the clinic today for followup visit accompanied by his wife. The patient is doing fine today. He tolerated the first cycle of FOLFOX fairly well except for mild fatigue 2-3 days after the treatment. He denied having any significant nausea or vomiting. He still has constipation. He took milk of magnesia with some improvement. No weight loss or night sweats. No chest pain or shortness of breath. The patient is here today to receive cycle #2 of his chemotherapy.  MEDICAL HISTORY: Past Medical History  Diagnosis Date  . Cancer   . Heart murmur   . Abdominal pain, periumbilical   . Constipation     ALLERGIES:   has no known allergies.  MEDICATIONS:  Current Outpatient Prescriptions  Medication Sig Dispense Refill  . aspirin 81 MG tablet Take 81 mg by mouth every morning.       Philip Shaw (COLACE PO) Take 2 tablets by mouth 2 (two) times daily.        Marland Kitchen lidocaine-prilocaine (EMLA) cream Apply 1 application topically as needed.        Marland Kitchen lisinopril (PRINIVIL,ZESTRIL) 20 MG tablet Take 20 mg by mouth every morning.       . metFORMIN (GLUCOPHAGE) 850 MG tablet Take 850  mg by mouth 2 (two) times daily with a meal.       . mirtazapine (REMERON) 30 MG tablet Take 30 mg by mouth at bedtime.       . Multiple Vitamins-Minerals (CENTRUM PO) Take 1 tablet by mouth daily.       . ondansetron (ZOFRAN) 8 MG tablet Take 8 mg by mouth every 8 (eight) hours as needed. NAUSEA       . oxyCODONE (OXY IR/ROXICODONE) 5 MG immediate release tablet Take 5 mg by mouth every 4 (four) hours as needed. PAIN       . oxyCODONE (OXYCONTIN) 20 MG 12 hr tablet Take 20 mg by mouth every 12 (twelve) hours. rx given to pt       . pravastatin (PRAVACHOL) 20 MG tablet Take 20 mg by mouth at bedtime.       . ranitidine (ZANTAC) 150 MG tablet Take 150 mg by mouth 2 (two) times daily.       Marland Kitchen terazosin (HYTRIN) 2 MG capsule Take 2 mg by mouth at bedtime.        No current facility-administered medications for this visit.   Facility-Administered Medications Ordered in Other Visits  Medication Dose Route Frequency Provider Last Rate Last Dose  . dexamethasone (DECADRON) injection 10 mg  10 mg Intravenous Once Philip Shaw K. Chiyoko Torrico, MD   10 mg at 08/18/11 0858  . dextrose 5 % solution   Intravenous Once Philip Shaw K. Arbutus Ped, MD      . fluorouracil (ADRUCIL)  4,450 mg in Shaw chloride 0.9 % 150 mL chemo infusion  2,400 mg/m2 (Treatment Plan Actual) Intravenous 1 day or 1 dose Philip Shaw K. Arbutus Ped, MD   4,450 mg at 08/18/11 1156  . fluorouracil (ADRUCIL) chemo injection 750 mg  400 mg/m2 (Treatment Plan Actual) Intravenous Once Philip Shaw K. Dariya Gainer, MD   750 mg at 08/18/11 1143  . leucovorin 744 mg in dextrose 5 % 250 mL infusion  400 mg/m2 (Treatment Plan Actual) Intravenous Once Philip Shaw K. Opie Fanton, MD   744 mg at 08/18/11 1610  . ondansetron (ZOFRAN) IVPB 8 mg  8 mg Intravenous Once Philip Shaw K. Marcquis Ridlon, MD   8 mg at 08/18/11 0859  . oxaliplatin (ELOXATIN) 160 mg in dextrose 5 % 500 mL chemo infusion  85 mg/m2 (Treatment Plan Actual) Intravenous Once Philip Shaw K. Arbutus Ped, MD   160 mg at 08/18/11 9604     SURGICAL HISTORY:  Past Surgical History  Procedure Date  . Appendectomy 33-51 years old  . Inguinal hernia repair as infant, age 43    LIH- infant, RIH age 34  . Pancreatectomy 06/2010    with splenectomy    REVIEW OF SYSTEMS:  Positive only for constipation.  PHYSICAL EXAMINATION: General appearance: alert, cooperative and no distress Head: Normocephalic, without obvious abnormality, atraumatic Neck: no adenopathy Lymph nodes: Cervical, supraclavicular, and axillary nodes normal. Resp: clear to auscultation bilaterally Cardio: regular rate and rhythm, S1, S2 normal, no murmur, click, rub or gallop GI: soft, non-tender; bowel sounds normal; no masses,  no organomegaly Extremities: extremities normal, atraumatic, no cyanosis or edema Neurologic: Alert and oriented X 3, normal strength and tone. Normal symmetric reflexes. Normal coordination and gait  ECOG PERFORMANCE STATUS: 1 - Symptomatic but completely ambulatory  There were no vitals taken for this visit.  LABORATORY DATA: Lab Results  Component Value Date   WBC 7.4 08/18/2011   HGB 10.0* 08/18/2011   HCT 30.6* 08/18/2011   MCV 93.0 08/18/2011   PLT 274 08/18/2011      Chemistry      Component Value Date/Time   NA 133* 08/11/2011 1320   K 5.0 08/11/2011 1320   CL 97 08/11/2011 1320   CO2 26 08/11/2011 1320   BUN 21 08/11/2011 1320   CREATININE 0.90 08/11/2011 1320      Component Value Date/Time   CALCIUM 9.2 08/11/2011 1320   ALKPHOS 91 08/11/2011 1320   AST 15 08/11/2011 1320   ALT 13 08/11/2011 1320   BILITOT 0.4 08/11/2011 1320       RADIOGRAPHIC STUDIES: Ir Fluoro Guide Cv Line Right  08/01/2011  *RADIOLOGY REPORT*  Clinical Data: 76 year old with pancreatic cancer and needs chemotherapy.  FLUOROSCOPIC AND ULTRASOUND GUIDED PLACEMENT OF A SUBCUTANEOUS PORT.  Physician: Rachelle Hora. Henn, MD  Medications:Versed 2 mg, Fentanyl 100 mcg. A radiology nurse monitored the patient for moderate sedation.  As  antibiotic prophylaxis, Ancef 1 gm was ordered pre-procedure and administered intravenously within one hour of incision.  Fluoroscopy:  0.4 minutes  Procedure:  The risks of the procedure were explained to the patient.  Informed consent was obtained.  Patient was placed supine on the interventional table.  Ultrasound confirmed a patent right internal jugular vein.  The right chest and neck were cleaned with a skin antiseptic and a sterile drape was placed.  Maximal barrier sterile technique was utilized including caps, mask, sterile gowns, sterile gloves, sterile drape, hand hygiene and skin antiseptic. The right neck was anesthetized with 1% lidocaine.  Small incision  was made in the right neck with a blade.  Micropuncture set was placed in the right IJ with ultrasound guidance.  The micropuncture wire was used for measurement purposes.  The right chest was anesthetized with 1% lidocaine with epinephrine.  #15 blade was used to make an incision and a subcutaneous port pocket was formed. 8 french Power Port was assembled.  Subcutaneous tunnel was formed with a stiff tunneling device.  The port catheter was brought through the subcutaneous tunnel.  The port was placed in the subcutaneous pocket.  The micropuncture set was exchanged for a peel-away sheath.  The catheter was placed through the peel-away sheath and the tip was positioned in the SVC.  Catheter placement was confirmed with fluoroscopy.  The port was accessed and flushed with heparinized saline.  The port pocket was closed using two layers of absorbable sutures and Dermabond.  The vein skin site was closed using a single layer of absorbable suture and Dermabond. Sterile dressings were applied.  Patient tolerated the procedure well without an immediate complication.  Ultrasound and fluoroscopic images were taken and saved for this procedure.  Complications: None  Impression:  Placement of a subcutaneous port device.  The catheter tip is in the SVC the and  ready to be used.  Original Report Authenticated By: Richarda Overlie, M.D.   ASSESSMENT: This is a very pleasant 76 years old white male with metastatic pancreatic adenocarcinoma currently on systemic chemotherapy with FOLFOX status post 1 cycle. The patient is tolerating his treatment fairly well.   PLAN: We will proceed with cycle #2 today as scheduled. He would come back for followup visit in 2 weeks with the start of cycle #3. The patient was advised to call me immediately she has any concerning symptoms in the interval.  All questions were answered. The patient knows to call the clinic with any problems, questions or concerns. We can certainly see the patient much sooner if necessary.

## 2011-08-20 ENCOUNTER — Ambulatory Visit (HOSPITAL_BASED_OUTPATIENT_CLINIC_OR_DEPARTMENT_OTHER): Payer: Non-veteran care

## 2011-08-20 ENCOUNTER — Other Ambulatory Visit: Payer: Self-pay | Admitting: Certified Registered Nurse Anesthetist

## 2011-08-20 VITALS — BP 140/90 | HR 102 | Temp 97.0°F

## 2011-08-20 DIAGNOSIS — C259 Malignant neoplasm of pancreas, unspecified: Secondary | ICD-10-CM

## 2011-08-20 MED ORDER — SODIUM CHLORIDE 0.9 % IJ SOLN
10.0000 mL | INTRAMUSCULAR | Status: DC | PRN
  Filled 2011-08-20: qty 10

## 2011-08-20 MED ORDER — HEPARIN SOD (PORK) LOCK FLUSH 100 UNIT/ML IV SOLN
500.0000 [IU] | Freq: Once | INTRAVENOUS | Status: DC | PRN
  Filled 2011-08-20: qty 5

## 2011-08-21 ENCOUNTER — Other Ambulatory Visit: Payer: Self-pay | Admitting: Internal Medicine

## 2011-08-21 ENCOUNTER — Telehealth: Payer: Self-pay | Admitting: Internal Medicine

## 2011-08-21 DIAGNOSIS — C259 Malignant neoplasm of pancreas, unspecified: Secondary | ICD-10-CM

## 2011-08-21 NOTE — Telephone Encounter (Signed)
I gave his daughter his jan appointments. She confirmed them

## 2011-08-22 ENCOUNTER — Other Ambulatory Visit: Payer: Self-pay | Admitting: Certified Registered Nurse Anesthetist

## 2011-08-27 ENCOUNTER — Telehealth: Payer: Self-pay | Admitting: Internal Medicine

## 2011-08-27 NOTE — Telephone Encounter (Signed)
Per 12/27 order per mkm add a lab to 1/21 infusion,lab added but per mkm note he gave daughter the wrong time,advised him to send pt to sch for a print out at 1/7 appt.    aom

## 2011-08-28 ENCOUNTER — Encounter: Payer: Self-pay | Admitting: *Deleted

## 2011-08-28 NOTE — Progress Notes (Signed)
Labs completed at [facility] given to Dr Donnald Garre to review

## 2011-09-01 ENCOUNTER — Ambulatory Visit: Payer: Medicare Other | Admitting: Nutrition

## 2011-09-01 ENCOUNTER — Other Ambulatory Visit (HOSPITAL_BASED_OUTPATIENT_CLINIC_OR_DEPARTMENT_OTHER): Payer: Medicare Other | Admitting: Lab

## 2011-09-01 ENCOUNTER — Other Ambulatory Visit: Payer: Self-pay | Admitting: Internal Medicine

## 2011-09-01 ENCOUNTER — Ambulatory Visit (HOSPITAL_BASED_OUTPATIENT_CLINIC_OR_DEPARTMENT_OTHER): Payer: Medicare Other

## 2011-09-01 ENCOUNTER — Ambulatory Visit (HOSPITAL_BASED_OUTPATIENT_CLINIC_OR_DEPARTMENT_OTHER): Payer: Medicare Other | Admitting: Internal Medicine

## 2011-09-01 VITALS — BP 108/54 | HR 71 | Temp 99.1°F | Ht 67.0 in | Wt 160.0 lb

## 2011-09-01 DIAGNOSIS — C252 Malignant neoplasm of tail of pancreas: Secondary | ICD-10-CM

## 2011-09-01 DIAGNOSIS — Z5111 Encounter for antineoplastic chemotherapy: Secondary | ICD-10-CM

## 2011-09-01 DIAGNOSIS — C787 Secondary malignant neoplasm of liver and intrahepatic bile duct: Secondary | ICD-10-CM

## 2011-09-01 DIAGNOSIS — C259 Malignant neoplasm of pancreas, unspecified: Secondary | ICD-10-CM

## 2011-09-01 DIAGNOSIS — C779 Secondary and unspecified malignant neoplasm of lymph node, unspecified: Secondary | ICD-10-CM

## 2011-09-01 DIAGNOSIS — R52 Pain, unspecified: Secondary | ICD-10-CM

## 2011-09-01 DIAGNOSIS — C50919 Malignant neoplasm of unspecified site of unspecified female breast: Secondary | ICD-10-CM

## 2011-09-01 LAB — COMPREHENSIVE METABOLIC PANEL
Alkaline Phosphatase: 87 U/L (ref 39–117)
BUN: 17 mg/dL (ref 6–23)
Creatinine, Ser: 0.86 mg/dL (ref 0.50–1.35)
Glucose, Bld: 217 mg/dL — ABNORMAL HIGH (ref 70–99)
Sodium: 134 mEq/L — ABNORMAL LOW (ref 135–145)
Total Bilirubin: 0.3 mg/dL (ref 0.3–1.2)
Total Protein: 6.1 g/dL (ref 6.0–8.3)

## 2011-09-01 LAB — CBC WITH DIFFERENTIAL/PLATELET
Basophils Absolute: 0.1 10*3/uL (ref 0.0–0.1)
EOS%: 1.8 % (ref 0.0–7.0)
HGB: 9.9 g/dL — ABNORMAL LOW (ref 13.0–17.1)
LYMPH%: 17.8 % (ref 14.0–49.0)
MCH: 30.8 pg (ref 27.2–33.4)
MCV: 91.9 fL (ref 79.3–98.0)
MONO%: 19.3 % — ABNORMAL HIGH (ref 0.0–14.0)
Platelets: 202 10*3/uL (ref 140–400)
RDW: 15.3 % — ABNORMAL HIGH (ref 11.0–14.6)

## 2011-09-01 MED ORDER — OXYCODONE HCL 20 MG PO TB12: 20.0000 mg | ORAL_TABLET | Freq: Two times a day (BID) | ORAL | Status: DC

## 2011-09-01 MED ORDER — OXYCODONE HCL 5 MG PO TABS: 5.0000 mg | ORAL_TABLET | ORAL | Status: DC | PRN

## 2011-09-01 MED ORDER — DEXTROSE 5 % IV SOLN
400.0000 mg/m2 | Freq: Once | INTRAVENOUS | Status: AC
  Administered 2011-09-01: 744 mg via INTRAVENOUS
  Filled 2011-09-01: qty 37.2

## 2011-09-01 MED ORDER — DEXAMETHASONE SODIUM PHOSPHATE 10 MG/ML IJ SOLN
10.0000 mg | Freq: Once | INTRAMUSCULAR | Status: AC
  Administered 2011-09-01: 10 mg via INTRAVENOUS

## 2011-09-01 MED ORDER — OXALIPLATIN CHEMO INJECTION 100 MG/20ML
85.0000 mg/m2 | Freq: Once | INTRAVENOUS | Status: AC
  Administered 2011-09-01: 160 mg via INTRAVENOUS
  Filled 2011-09-01: qty 32

## 2011-09-01 MED ORDER — DEXTROSE 5 % IV SOLN: Freq: Once | INTRAVENOUS | Status: DC

## 2011-09-01 MED ORDER — SODIUM CHLORIDE 0.9 % IV SOLN
2400.0000 mg/m2 | INTRAVENOUS | Status: DC
  Administered 2011-09-01: 4450 mg via INTRAVENOUS
  Filled 2011-09-01: qty 89

## 2011-09-01 MED ORDER — ONDANSETRON 8 MG/50ML IVPB (CHCC)
8.0000 mg | Freq: Once | INTRAVENOUS | Status: AC
  Administered 2011-09-01: 8 mg via INTRAVENOUS

## 2011-09-01 MED ORDER — FLUOROURACIL CHEMO INJECTION 2.5 GM/50ML
400.0000 mg/m2 | Freq: Once | INTRAVENOUS | Status: AC
  Administered 2011-09-01: 750 mg via INTRAVENOUS
  Filled 2011-09-01: qty 15

## 2011-09-01 NOTE — Telephone Encounter (Signed)
Faxed oxycodone and oxycontin rx refill  to [facility] at [phone] using the requested form template on line

## 2011-09-01 NOTE — Progress Notes (Signed)
I was asked by nursing to follow up with Philip Shaw.  He is requesting some Ensure Plus.  I have last seen Philip Shaw on December 14th.  He was having issues with weight loss.  We were attempting to find him a supplement that he could tolerate.  So I did follow up with Philip Shaw and his wife today in the chemotherapy area.  The patient did not care for Magic Cup, which was a supplement product that we had sampled.  However, he is interested in receiving some Ensure Plus through the Cancer Center.  He reports that he is drinking 5 to 6 of these a day even though he does not care much for the taste of them.  Weight today was documented as 160 pounds, which is basically stable from his weight documented back in December of 159 pounds.  NUTRITION DIAGNOSIS:  Unintended weight loss has improved.  INTERVENTION:  I provided Philip Shaw with 1 complimentary case of Ensure Plus.  I have encouraged him to continue to consume these as tolerated.  I have offered suggestions on some recipes that he could utilize to increase palatability of this product.  The patient was pleased with information.  MONITORING, EVALUATION, AND GOALS:  The patient has tolerated increased calories and protein to minimize weight loss.  He will continue to consume Ensure Plus 5 to 6 daily.  NEXT VISIT:  There is no followup scheduled with Philip Shaw.  However, he is to call me if he has further questions or concerns.    ______________________________ Zenovia Jarred, RD, LDN Clinical Nutrition Specialist BN/MEDQ  D:  09/01/2011  T:  09/01/2011  Job:  [id number]

## 2011-09-02 ENCOUNTER — Telehealth: Payer: Self-pay | Admitting: Internal Medicine

## 2011-09-02 DIAGNOSIS — R52 Pain, unspecified: Secondary | ICD-10-CM

## 2011-09-02 MED ORDER — OXYCODONE HCL 20 MG PO TB12: 20.0000 mg | ORAL_TABLET | Freq: Two times a day (BID) | ORAL | Status: DC

## 2011-09-02 MED ORDER — OXYCODONE HCL 5 MG PO TABS: 5.0000 mg | ORAL_TABLET | ORAL | Status: DC | PRN

## 2011-09-02 NOTE — Telephone Encounter (Addendum)
Confirmed mailing address for narcotic prescriptions and mailed pt narcotic refills to mail order pharmacy  [facility] [id number] [place], [place] [address] Daughter contacted about above. I called [facility] and was told it takes 2 weeks to get med to pt. I will have mid level write for 2 week supply of narcotics and call wife to pick up rx in am

## 2011-09-02 NOTE — Progress Notes (Signed)
[facility] OFFICE PROGRESS NOTE  [name], MD Dr. Sharman Crate Hamrick [address] [place] [address]  DIAGNOSIS: Metastatic pancreatic adenocarcinoma, now with metastatic disease to the liver and the abdominal wall, initially diagnosed with stage IIIB(T3, N1, M0) pancreatic tail adenocarcinoma in November of 2011.   PRIOR THERAPY:  #1 distal pancreatectomy and splenectomy on 06/25/2010  #2 adjuvant chemotherapy with gemcitabine started 08/20/2010 and completed 2 cycles on 12/02/2010.  #3 status post concurrent chemoradiation with Xeloda from 12/15/2010 on 10 01/24/2011.  #4 status post 2 more cycles of adjuvant gemcitabine with gemcitabine completed on 04/04/2011.   CURRENT THERAPY: FOLFOX giving every 2 weeks status post 2 cycle.    INTERVAL HISTORY: Philip Shaw 77 y.o. male returns to the clinic today for followup visit accompanied his wife and daughter. The patient is feeling fine today and he denied having any significant complaints. He tolerated the second cycle of his chemotherapy fairly well. He has no significant nausea or vomiting, no weight loss or night sweats. 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 He is here to start cycle #3.  MEDICAL HISTORY: Past Medical History  Diagnosis Date  . Cancer   . Heart murmur   . Abdominal pain, periumbilical   . Constipation     ALLERGIES:   has no known allergies.  MEDICATIONS:  Current Outpatient Prescriptions  Medication Sig Dispense Refill  . aspirin 81 MG tablet Take 81 mg by mouth every morning.       Tery Sanfilippo Sodium (COLACE PO) Take 2 tablets by mouth 2 (two) times daily.        Marland Kitchen lidocaine-prilocaine (EMLA) cream Apply 1 application topically as needed.        Marland Kitchen lisinopril (PRINIVIL,ZESTRIL) 20 MG tablet Take 20 mg by mouth every morning.       . magnesium hydroxide (MILK OF MAGNESIA) 400 MG/5ML suspension Take by mouth daily.        . metFORMIN  (GLUCOPHAGE) 850 MG tablet Take 850 mg by mouth 2 (two) times daily with a meal.       . mirtazapine (REMERON) 30 MG tablet Take 30 mg by mouth at bedtime.       . Multiple Vitamins-Minerals (CENTRUM PO) Take 1 tablet by mouth daily.       . ondansetron (ZOFRAN) 8 MG tablet Take 8 mg by mouth every 8 (eight) hours as needed. NAUSEA       . pravastatin (PRAVACHOL) 20 MG tablet Take 20 mg by mouth at bedtime.       . ranitidine (ZANTAC) 150 MG tablet Take 150 mg by mouth 2 (two) times daily.       Marland Kitchen terazosin (HYTRIN) 2 MG capsule Take 2 mg by mouth at bedtime.       Marland Kitchen oxyCODONE (OXY IR/ROXICODONE) 5 MG immediate release tablet Take 1 tablet (5 mg total) by mouth every 4 (four) hours as needed. PAIN   30 tablet  0  . oxyCODONE (OXYCONTIN) 20 MG 12 hr tablet Take 1 tablet (20 mg total) by mouth every 12 (twelve) hours. rx given to pt  60 tablet  0   No current facility-administered medications for this visit.   Facility-Administered Medications Ordered in Other Visits  Medication Dose Route Frequency Provider Last Rate Last Dose  . dexamethasone (DECADRON) injection 10 mg  10 mg Intravenous Once Annaleah Arata K. Laurel Smeltz, MD   10 mg at 09/01/11 1320  .  fluorouracil (ADRUCIL) 4,450 mg in sodium chloride 0.9 % 150 mL chemo infusion  2,400 mg/m2 (Treatment Plan Actual) Intravenous 1 day or 1 dose Amere Iott K. Arbutus Ped, MD   4,450 mg at 08/18/11 1156  . fluorouracil (ADRUCIL) chemo injection 750 mg  400 mg/m2 (Treatment Plan Actual) Intravenous Once Duana Benedict K. Warnie Belair, MD   750 mg at 09/01/11 1600  . leucovorin 744 mg in dextrose 5 % 250 mL infusion  400 mg/m2 (Treatment Plan Actual) Intravenous Once Annajulia Lewing K. Quiara Killian, MD   744 mg at 09/01/11 1356  . ondansetron (ZOFRAN) IVPB 8 mg  8 mg Intravenous Once Chritopher Coster K. Brayen Bunn, MD   8 mg at 09/01/11 1320  . oxaliplatin (ELOXATIN) 160 mg in dextrose 5 % 500 mL chemo infusion  85 mg/m2 (Treatment Plan Actual) Intravenous Once Candler Ginsberg K. Akeem Heppler, MD   160 mg at  09/01/11 1356  . DISCONTD: dextrose 5 % solution   Intravenous Once Maleaha Hughett K. Arbutus Ped, MD      . DISCONTD: fluorouracil (ADRUCIL) 4,450 mg in sodium chloride 0.9 % 150 mL chemo infusion  2,400 mg/m2 (Treatment Plan Actual) Intravenous 1 day or 1 dose Gavan Nordby K. Arbutus Ped, MD   4,450 mg at 09/01/11 1607    SURGICAL HISTORY:  Past Surgical History  Procedure Date  . Appendectomy 6-67 years old  . Inguinal hernia repair as infant, age 71    LIH- infant, RIH age 17  . Pancreatectomy 06/2010    with splenectomy    REVIEW OF SYSTEMS:  A comprehensive review of systems was negative except for: Constitutional: positive for fatigue   PHYSICAL EXAMINATION: General appearance: alert, cooperative and no distress Head: Normocephalic, without obvious abnormality, atraumatic Neck: no adenopathy Lymph nodes: Cervical, supraclavicular, and axillary nodes normal. Resp: clear to auscultation bilaterally Cardio: regular rate and rhythm, S1, S2 normal, no murmur, click, rub or gallop GI: soft, non-tender; bowel sounds normal; no masses,  no organomegaly Extremities: extremities normal, atraumatic, no cyanosis or edema Neurologic: Alert and oriented X 3, normal strength and tone. Normal symmetric reflexes. Normal coordination and gait  ECOG PERFORMANCE STATUS: 1 - Symptomatic but completely ambulatory  Blood pressure 108/54, pulse 71, temperature 99.1 F (37.3 C), temperature source Oral, height 5\' 7"  (1.702 m), weight 160 lb (72.576 kg).  LABORATORY DATA: Lab Results  Component Value Date   WBC 6.6 09/01/2011   HGB 9.9* 09/01/2011   HCT 29.5* 09/01/2011   MCV 91.9 09/01/2011   PLT 202 09/01/2011      Chemistry      Component Value Date/Time   NA 134* 09/01/2011 1144   K 4.8 09/01/2011 1144   CL 97 09/01/2011 1144   CO2 28 09/01/2011 1144   BUN 17 09/01/2011 1144   CREATININE 0.86 09/01/2011 1144      Component Value Date/Time   CALCIUM 9.0 09/01/2011 1144   ALKPHOS 87 09/01/2011 1144   AST 21 09/01/2011 1144    ALT 17 09/01/2011 1144   BILITOT 0.3 09/01/2011 1144       ASSESSMENT: This is a very pleasant 77 years old white male with metastatic pancreatic adenocarcinoma currently on systemic chemotherapy with FOLFOX status post 2 cycles. The patient is tolerating his treatment fairly well.   PLAN: We will proceed with cycle #3 today as scheduled. The patient would come back for followup visit in 2 weeks with the start of cycle #4. He was advised to call me immediately she has any concerning symptoms in the interval.  All questions were  answered. The patient knows to call the clinic with any problems, questions or concerns. We can certainly see the patient much sooner if necessary.

## 2011-09-03 ENCOUNTER — Ambulatory Visit (HOSPITAL_BASED_OUTPATIENT_CLINIC_OR_DEPARTMENT_OTHER): Payer: BLUE CROSS/BLUE SHIELD

## 2011-09-03 ENCOUNTER — Other Ambulatory Visit: Payer: Self-pay | Admitting: *Deleted

## 2011-09-03 VITALS — BP 100/54 | HR 52 | Temp 98.4°F

## 2011-09-03 DIAGNOSIS — Z5111 Encounter for antineoplastic chemotherapy: Secondary | ICD-10-CM

## 2011-09-03 DIAGNOSIS — C259 Malignant neoplasm of pancreas, unspecified: Secondary | ICD-10-CM

## 2011-09-03 DIAGNOSIS — C787 Secondary malignant neoplasm of liver and intrahepatic bile duct: Secondary | ICD-10-CM

## 2011-09-03 DIAGNOSIS — C252 Malignant neoplasm of tail of pancreas: Secondary | ICD-10-CM

## 2011-09-03 DIAGNOSIS — Z452 Encounter for adjustment and management of vascular access device: Secondary | ICD-10-CM

## 2011-09-03 MED ORDER — MIRTAZAPINE 30 MG PO TABS: 30.0000 mg | ORAL_TABLET | Freq: Every day | ORAL | Status: DC

## 2011-09-03 MED ORDER — ONDANSETRON HCL 8 MG PO TABS: 8.0000 mg | ORAL_TABLET | Freq: Three times a day (TID) | ORAL | Status: DC | PRN

## 2011-09-03 MED ORDER — HEPARIN SOD (PORK) LOCK FLUSH 100 UNIT/ML IV SOLN
500.0000 [IU] | Freq: Once | INTRAVENOUS | Status: AC | PRN
  Administered 2011-09-03: 500 [IU]
  Filled 2011-09-03: qty 5

## 2011-09-03 MED ORDER — SODIUM CHLORIDE 0.9 % IJ SOLN
10.0000 mL | INTRAMUSCULAR | Status: DC | PRN
  Administered 2011-09-03: 10 mL
  Filled 2011-09-03: qty 10

## 2011-09-03 MED ORDER — LIDOCAINE-PRILOCAINE 2.5-2.5 % EX CREA: 1.0000 "application " | TOPICAL_CREAM | CUTANEOUS | Status: DC | PRN

## 2011-09-03 NOTE — Patient Instructions (Signed)
Patient aware of next appointment; discharged with no complaints of pain

## 2011-09-09 ENCOUNTER — Telehealth: Payer: Self-pay | Admitting: Internal Medicine

## 2011-09-09 NOTE — Telephone Encounter (Signed)
Received fax requesting top please mail  hard copies of 2 narcotic rx that were dated and faxed on 09/01/11. Hard copies for oxycodone and oxycontin dated 09/01/11  mailed to [facility] pharmacy today

## 2011-09-10 ENCOUNTER — Telehealth: Payer: Self-pay | Admitting: Internal Medicine

## 2011-09-10 ENCOUNTER — Other Ambulatory Visit: Payer: Self-pay | Admitting: Internal Medicine

## 2011-09-10 NOTE — Telephone Encounter (Signed)
Received eligible fax from [facility] re med needing prior authorization-Called pts daughter to see if she has any  information on which medicaiton it is.Left message to call back

## 2011-09-11 ENCOUNTER — Encounter: Payer: Self-pay | Admitting: *Deleted

## 2011-09-11 NOTE — Progress Notes (Signed)
Prior authorization form for pt given to Thelma Barge in medical mgmt to review.  SLJ

## 2011-09-15 ENCOUNTER — Other Ambulatory Visit: Payer: Medicare Other | Admitting: Lab

## 2011-09-15 ENCOUNTER — Telehealth: Payer: Self-pay | Admitting: Internal Medicine

## 2011-09-15 ENCOUNTER — Ambulatory Visit (HOSPITAL_BASED_OUTPATIENT_CLINIC_OR_DEPARTMENT_OTHER): Payer: Medicare Other | Admitting: Physician Assistant

## 2011-09-15 ENCOUNTER — Ambulatory Visit (HOSPITAL_BASED_OUTPATIENT_CLINIC_OR_DEPARTMENT_OTHER): Payer: Medicare Other

## 2011-09-15 VITALS — BP 115/50 | HR 47 | Temp 98.5°F | Ht 67.0 in | Wt 160.4 lb

## 2011-09-15 DIAGNOSIS — C787 Secondary malignant neoplasm of liver and intrahepatic bile duct: Secondary | ICD-10-CM

## 2011-09-15 DIAGNOSIS — C259 Malignant neoplasm of pancreas, unspecified: Secondary | ICD-10-CM

## 2011-09-15 DIAGNOSIS — K5900 Constipation, unspecified: Secondary | ICD-10-CM

## 2011-09-15 DIAGNOSIS — C50919 Malignant neoplasm of unspecified site of unspecified female breast: Secondary | ICD-10-CM

## 2011-09-15 DIAGNOSIS — C252 Malignant neoplasm of tail of pancreas: Secondary | ICD-10-CM

## 2011-09-15 DIAGNOSIS — Z5111 Encounter for antineoplastic chemotherapy: Secondary | ICD-10-CM

## 2011-09-15 LAB — CBC WITH DIFFERENTIAL/PLATELET
BASO%: 0.4 % (ref 0.0–2.0)
HCT: 28.2 % — ABNORMAL LOW (ref 38.4–49.9)
LYMPH%: 17.5 % (ref 14.0–49.0)
MCHC: 33.7 g/dL (ref 32.0–36.0)
MCV: 92.5 fL (ref 79.3–98.0)
MONO#: 1.2 10*3/uL — ABNORMAL HIGH (ref 0.1–0.9)
MONO%: 23 % — ABNORMAL HIGH (ref 0.0–14.0)
NEUT%: 55.8 % (ref 39.0–75.0)
Platelets: 184 10*3/uL (ref 140–400)
WBC: 5.1 10*3/uL (ref 4.0–10.3)

## 2011-09-15 LAB — COMPREHENSIVE METABOLIC PANEL
AST: 28 U/L (ref 0–37)
BUN: 16 mg/dL (ref 6–23)
CO2: 26 mEq/L (ref 19–32)
Calcium: 8.9 mg/dL (ref 8.4–10.5)
Chloride: 98 mEq/L (ref 96–112)
Creatinine, Ser: 0.95 mg/dL (ref 0.50–1.35)
Total Bilirubin: 0.6 mg/dL (ref 0.3–1.2)

## 2011-09-15 MED ORDER — DEXAMETHASONE SODIUM PHOSPHATE 10 MG/ML IJ SOLN
10.0000 mg | Freq: Once | INTRAMUSCULAR | Status: AC
  Administered 2011-09-15: 10 mg via INTRAVENOUS

## 2011-09-15 MED ORDER — OXALIPLATIN CHEMO INJECTION 100 MG/20ML
85.0000 mg/m2 | Freq: Once | INTRAVENOUS | Status: AC
  Administered 2011-09-15: 160 mg via INTRAVENOUS
  Filled 2011-09-15: qty 32

## 2011-09-15 MED ORDER — LEUCOVORIN CALCIUM INJECTION 350 MG
400.0000 mg/m2 | Freq: Once | INTRAVENOUS | Status: AC
  Administered 2011-09-15: 744 mg via INTRAVENOUS
  Filled 2011-09-15: qty 37.2

## 2011-09-15 MED ORDER — SODIUM CHLORIDE 0.9 % IV SOLN
2400.0000 mg/m2 | INTRAVENOUS | Status: DC
  Administered 2011-09-15: 4450 mg via INTRAVENOUS
  Filled 2011-09-15: qty 89

## 2011-09-15 MED ORDER — ONDANSETRON 8 MG/50ML IVPB (CHCC)
8.0000 mg | Freq: Once | INTRAVENOUS | Status: AC
  Administered 2011-09-15: 8 mg via INTRAVENOUS

## 2011-09-15 MED ORDER — FLUOROURACIL CHEMO INJECTION 2.5 GM/50ML
400.0000 mg/m2 | Freq: Once | INTRAVENOUS | Status: AC
  Administered 2011-09-15: 750 mg via INTRAVENOUS
  Filled 2011-09-15: qty 15

## 2011-09-15 MED ORDER — DEXTROSE 5 % IV SOLN: Freq: Once | INTRAVENOUS | Status: DC

## 2011-09-15 NOTE — Telephone Encounter (Signed)
Gv pt appt for [id number] °

## 2011-09-16 NOTE — Progress Notes (Signed)
[facility] OFFICE PROGRESS NOTE  Philip Ravel, MD, MD Dr. Burnell Blanks [address] [place] [address]  DIAGNOSIS: Metastatic pancreatic adenocarcinoma, now with metastatic disease to the liver and the abdominal wall, initially diagnosed with stage IIIB(T3, N1, M0) pancreatic tail adenocarcinoma in November of 2011.   PRIOR THERAPY:  #1 distal pancreatectomy and splenectomy on 06/25/2010  #2 adjuvant chemotherapy with gemcitabine started 08/20/2010 and completed 2 cycles on 12/02/2010.  #3 status post concurrent chemoradiation with Xeloda from 12/15/2010 on 10 01/24/2011.  #4 status post 2 more cycles of adjuvant gemcitabine with gemcitabine completed on 04/04/2011.   CURRENT THERAPY: FOLFOX giving every 2 weeks status post 3 cycles.    INTERVAL HISTORY: Philip Shaw 77 y.o. male returns to the clinic today for followup visit accompanied his wife and daughter. He complains of decreased energy. He also reports constipation and is using 2. Stool softeners twice daily in addition to a dose of milk of magnesia in order to have a daily bowel movement. He did not obtain any relief from his constipation with MiraLAX. He also reports decreased appetite although he is following the nutritionist advise been drinking approximately 6 Boost shakes daily.  Marland Kitchen  MEDICAL HISTORY: Past Medical History  Diagnosis Date  . Cancer   . Heart murmur   . Abdominal pain, periumbilical   . Constipation     ALLERGIES:   has no known allergies.  MEDICATIONS:  Current Outpatient Prescriptions  Medication Sig Dispense Refill  . aspirin 81 MG tablet Take 81 mg by mouth every morning.       Tery Sanfilippo Sodium (COLACE PO) Take 2 tablets by mouth 2 (two) times daily.        Marland Kitchen lidocaine-prilocaine (EMLA) cream Apply 1 application topically as needed.  30 g  2  . lisinopril (PRINIVIL,ZESTRIL) 20 MG tablet Take 20 mg by mouth every morning.       . magnesium hydroxide (MILK OF  MAGNESIA) 400 MG/5ML suspension Take by mouth daily.        . metFORMIN (GLUCOPHAGE) 850 MG tablet Take 850 mg by mouth 2 (two) times daily with a meal.       . mirtazapine (REMERON) 30 MG tablet Take 1 tablet (30 mg total) by mouth at bedtime.  90 tablet  0  . Multiple Vitamins-Minerals (CENTRUM PO) Take 1 tablet by mouth daily.       . ondansetron (ZOFRAN) 8 MG tablet Take 1 tablet (8 mg total) by mouth every 8 (eight) hours as needed for nausea. NAUSEA  20 tablet  3  . oxyCODONE (OXY IR/ROXICODONE) 5 MG immediate release tablet Take 1 tablet (5 mg total) by mouth every 4 (four) hours as needed. PAIN   15 tablet  0  . oxyCODONE (OXYCONTIN) 20 MG 12 hr tablet Take 1 tablet (20 mg total) by mouth every 12 (twelve) hours. rx given to pt  30 tablet  0  . pravastatin (PRAVACHOL) 20 MG tablet Take 20 mg by mouth at bedtime.       . ranitidine (ZANTAC) 150 MG tablet Take 150 mg by mouth 2 (two) times daily.       Marland Kitchen terazosin (HYTRIN) 2 MG capsule Take 2 mg by mouth at bedtime.        No current facility-administered medications for this visit.   Facility-Administered Medications Ordered in Other Visits  Medication Dose Route Frequency Provider Last Rate Last Dose  . fluorouracil (ADRUCIL) 4,450 mg in sodium  chloride 0.9 % 150 mL chemo infusion  2,400 mg/m2 (Treatment Plan Actual) Intravenous 1 day or 1 dose Mohamed K. Arbutus Ped, MD   4,450 mg at 08/18/11 1156  . fluorouracil (ADRUCIL) chemo injection 750 mg  400 mg/m2 (Treatment Plan Actual) Intravenous Once Mohamed K. Mohamed, MD   750 mg at 09/15/11 1422  . leucovorin 744 mg in dextrose 5 % 250 mL infusion  400 mg/m2 (Treatment Plan Actual) Intravenous Once Mohamed K. Mohamed, MD   744 mg at 09/15/11 1213  . oxaliplatin (ELOXATIN) 160 mg in dextrose 5 % 500 mL chemo infusion  85 mg/m2 (Treatment Plan Actual) Intravenous Once Mohamed K. Mohamed, MD   160 mg at 09/15/11 1213  . DISCONTD: dextrose 5 % solution   Intravenous Once Mohamed K. Arbutus Ped, MD       . DISCONTD: fluorouracil (ADRUCIL) 4,450 mg in sodium chloride 0.9 % 150 mL chemo infusion  2,400 mg/m2 (Treatment Plan Actual) Intravenous 1 day or 1 dose Mohamed K. Arbutus Ped, MD   4,450 mg at 09/15/11 1428    SURGICAL HISTORY:  Past Surgical History  Procedure Date  . Appendectomy 57-49 years old  . Inguinal hernia repair as infant, age 11    LIH- infant, RIH age 48  . Pancreatectomy 06/2010    with splenectomy    REVIEW OF SYSTEMS:  A comprehensive review of systems was negative except for: Constitutional: positive for fatigue and Decreased appetite Gastrointestinal: positive for constipation   PHYSICAL EXAMINATION: General appearance: alert, cooperative and no distress Head: Normocephalic, without obvious abnormality, atraumatic Neck: no adenopathy Lymph nodes: Cervical, supraclavicular, and axillary nodes normal. Resp: clear to auscultation bilaterally Cardio: regular rate and rhythm, S1, S2 normal, no murmur, click, rub or gallop GI: soft, non-tender; bowel sounds normal; no masses,  no organomegaly Extremities: extremities normal, atraumatic, no cyanosis or edema Neurologic: Alert and oriented X 3, normal strength and tone. Normal symmetric reflexes. Normal coordination and gait  ECOG PERFORMANCE STATUS: 1 - Symptomatic but completely ambulatory  Blood pressure 115/50, pulse 47, temperature 98.5 F (36.9 C), temperature source Oral, height 5\' 7"  (1.702 m), weight 160 lb 6.4 oz (72.757 kg).  LABORATORY DATA: Lab Results  Component Value Date   WBC 5.1 09/15/2011   HGB 9.5* 09/15/2011   HCT 28.2* 09/15/2011   MCV 92.5 09/15/2011   PLT 184 09/15/2011      Chemistry      Component Value Date/Time   NA 132* 09/15/2011 1021   K 5.0 09/15/2011 1021   CL 98 09/15/2011 1021   CO2 26 09/15/2011 1021   BUN 16 09/15/2011 1021   CREATININE 0.95 09/15/2011 1021      Component Value Date/Time   CALCIUM 8.9 09/15/2011 1021   ALKPHOS 99 09/15/2011 1021   AST 28 09/15/2011 1021   ALT 21  09/15/2011 1021   BILITOT 0.6 09/15/2011 1021       ASSESSMENT: This is a very pleasant 77 years old white male with metastatic pancreatic adenocarcinoma currently on systemic chemotherapy with FOLFOX status post 3 cycles. The patient is tolerating his treatment fairly well. The patient was discussed with Dr. Arbutus Ped. He'll proceed with his next scheduled cycle of FOLFOX and follow up with Dr. Arbutus Ped in 2 weeks with repeat CBC differential and C. met. He was encouraged to increase his fluid intake perhaps try tablet juice or applesauce to help regulate his bowels in addition to his current efforts.   Conni Slipper, PA-C   All questions were answered.  The patient knows to call the clinic with any problems, questions or concerns. We can certainly see the patient much sooner if necessary.

## 2011-09-17 ENCOUNTER — Other Ambulatory Visit: Payer: Self-pay | Admitting: Certified Registered Nurse Anesthetist

## 2011-09-17 ENCOUNTER — Ambulatory Visit (HOSPITAL_BASED_OUTPATIENT_CLINIC_OR_DEPARTMENT_OTHER): Payer: Medicare Other

## 2011-09-17 VITALS — BP 116/65 | HR 47 | Temp 98.3°F

## 2011-09-17 DIAGNOSIS — Z469 Encounter for fitting and adjustment of unspecified device: Secondary | ICD-10-CM

## 2011-09-17 DIAGNOSIS — C259 Malignant neoplasm of pancreas, unspecified: Secondary | ICD-10-CM

## 2011-09-17 MED ORDER — SODIUM CHLORIDE 0.9 % IJ SOLN
10.0000 mL | INTRAMUSCULAR | Status: DC | PRN
  Administered 2011-09-17: 10 mL
  Filled 2011-09-17: qty 10

## 2011-09-17 MED ORDER — HEPARIN SOD (PORK) LOCK FLUSH 100 UNIT/ML IV SOLN
500.0000 [IU] | Freq: Once | INTRAVENOUS | Status: AC | PRN
  Administered 2011-09-17: 500 [IU]
  Filled 2011-09-17: qty 5

## 2011-09-18 ENCOUNTER — Other Ambulatory Visit: Payer: Self-pay | Admitting: Certified Registered Nurse Anesthetist

## 2011-09-19 ENCOUNTER — Telehealth: Payer: Self-pay | Admitting: Internal Medicine

## 2011-09-19 NOTE — Telephone Encounter (Incomplete)
09/10/11  °

## 2011-09-28 ENCOUNTER — Other Ambulatory Visit: Payer: Self-pay | Admitting: Internal Medicine

## 2011-09-29 ENCOUNTER — Other Ambulatory Visit (HOSPITAL_BASED_OUTPATIENT_CLINIC_OR_DEPARTMENT_OTHER): Payer: Medicare Other | Admitting: Lab

## 2011-09-29 ENCOUNTER — Telehealth: Payer: Self-pay | Admitting: Internal Medicine

## 2011-09-29 ENCOUNTER — Ambulatory Visit (HOSPITAL_BASED_OUTPATIENT_CLINIC_OR_DEPARTMENT_OTHER): Payer: Medicare Other

## 2011-09-29 ENCOUNTER — Ambulatory Visit: Payer: Medicare Other | Admitting: Internal Medicine

## 2011-09-29 VITALS — BP 126/72 | HR 108 | Temp 97.0°F | Wt 157.7 lb

## 2011-09-29 DIAGNOSIS — Z5111 Encounter for antineoplastic chemotherapy: Secondary | ICD-10-CM

## 2011-09-29 DIAGNOSIS — C259 Malignant neoplasm of pancreas, unspecified: Secondary | ICD-10-CM

## 2011-09-29 LAB — CBC WITH DIFFERENTIAL/PLATELET
BASO%: 1.1 % (ref 0.0–2.0)
Basophils Absolute: 0.1 10*3/uL (ref 0.0–0.1)
EOS%: 2.7 % (ref 0.0–7.0)
HGB: 10.2 g/dL — ABNORMAL LOW (ref 13.0–17.1)
MCH: 31.8 pg (ref 27.2–33.4)
MCHC: 33.4 g/dL (ref 32.0–36.0)
MCV: 95 fL (ref 79.3–98.0)
MONO%: 26.8 % — ABNORMAL HIGH (ref 0.0–14.0)
RBC: 3.21 10*6/uL — ABNORMAL LOW (ref 4.20–5.82)
RDW: 19.3 % — ABNORMAL HIGH (ref 11.0–14.6)
lymph#: 1.3 10*3/uL (ref 0.9–3.3)
nRBC: 1 % — ABNORMAL HIGH (ref 0–0)

## 2011-09-29 LAB — COMPREHENSIVE METABOLIC PANEL
AST: 27 U/L (ref 0–37)
Albumin: 3.8 g/dL (ref 3.5–5.2)
Alkaline Phosphatase: 116 U/L (ref 39–117)
Glucose, Bld: 201 mg/dL — ABNORMAL HIGH (ref 70–99)
Potassium: 5.1 mEq/L (ref 3.5–5.3)
Sodium: 131 mEq/L — ABNORMAL LOW (ref 135–145)
Total Bilirubin: 0.6 mg/dL (ref 0.3–1.2)
Total Protein: 6.4 g/dL (ref 6.0–8.3)

## 2011-09-29 MED ORDER — DEXTROSE 5 % IV SOLN: Freq: Once | INTRAVENOUS | Status: DC

## 2011-09-29 MED ORDER — OXALIPLATIN CHEMO INJECTION 100 MG/20ML
85.0000 mg/m2 | Freq: Once | INTRAVENOUS | Status: AC
  Administered 2011-09-29: 160 mg via INTRAVENOUS
  Filled 2011-09-29: qty 32

## 2011-09-29 MED ORDER — SODIUM CHLORIDE 0.9 % IV SOLN
2400.0000 mg/m2 | INTRAVENOUS | Status: DC
  Administered 2011-09-29: 4450 mg via INTRAVENOUS
  Filled 2011-09-29: qty 89

## 2011-09-29 MED ORDER — LEUCOVORIN CALCIUM INJECTION 350 MG
400.0000 mg/m2 | Freq: Once | INTRAVENOUS | Status: AC
  Administered 2011-09-29: 744 mg via INTRAVENOUS
  Filled 2011-09-29: qty 37.2

## 2011-09-29 MED ORDER — DEXAMETHASONE SODIUM PHOSPHATE 10 MG/ML IJ SOLN
10.0000 mg | Freq: Once | INTRAMUSCULAR | Status: AC
  Administered 2011-09-29: 10 mg via INTRAVENOUS

## 2011-09-29 MED ORDER — FLUOROURACIL CHEMO INJECTION 2.5 GM/50ML
400.0000 mg/m2 | Freq: Once | INTRAVENOUS | Status: AC
  Administered 2011-09-29: 750 mg via INTRAVENOUS
  Filled 2011-09-29: qty 15

## 2011-09-29 MED ORDER — ONDANSETRON 8 MG/50ML IVPB (CHCC)
8.0000 mg | Freq: Once | INTRAVENOUS | Status: AC
  Administered 2011-09-29: 8 mg via INTRAVENOUS

## 2011-09-29 NOTE — Progress Notes (Signed)
VAD access connected to [facility] pump, pump set and locked to deliver prescribed rate

## 2011-09-29 NOTE — Telephone Encounter (Signed)
Gv pt appt for [id number] °

## 2011-09-29 NOTE — Progress Notes (Signed)
[facility] OFFICE PROGRESS NOTE  [name], MD, MD Dr. Burnell Blanks [address] [place] [address]  DIAGNOSIS: Metastatic pancreatic adenocarcinoma, now with metastatic disease to the liver and the abdominal wall, initially diagnosed with stage IIIB(T3, N1, M0) pancreatic tail adenocarcinoma in November of 2011.   PRIOR THERAPY:  #1 distal pancreatectomy and splenectomy on 06/25/2010  #2 adjuvant chemotherapy with gemcitabine started 08/20/2010 and completed 2 cycles on 12/02/2010.  #3 status post concurrent chemoradiation with Xeloda from 12/15/2010 on 10 01/24/2011.  #4 status post 2 more cycles of adjuvant gemcitabine with gemcitabine completed on 04/04/2011.   CURRENT THERAPY: FOLFOX giving every 2 weeks status post 4 cycles.    INTERVAL HISTORY: Philip Shaw 77 y.o. male returns to the clinic today for followup visit accompanied his wife and daughter. The patient tolerated the last cycle of her chemotherapy fairly well except for one day of nausea and vomiting. He was out of his Zofran and this was refilled at his local pharmacy. He also continues to have constipation with bowel movements every 2-3 days. It responds well to the milk of magnesia. The patient denied having any other significant complaints with his chemotherapy. He is here today to start cycle #5.   MEDICAL HISTORY: Past Medical History  Diagnosis Date  . Cancer   . Heart murmur   . Abdominal pain, periumbilical   . Constipation     ALLERGIES:   has no known allergies.  MEDICATIONS:  Current Outpatient Prescriptions  Medication Sig Dispense Refill  . aspirin 81 MG tablet Take 81 mg by mouth every morning.       Tery Sanfilippo Sodium (COLACE PO) Take 2 tablets by mouth 2 (two) times daily.        Marland Kitchen lidocaine-prilocaine (EMLA) cream Apply 1 application topically as needed.  30 g  2  . lisinopril (PRINIVIL,ZESTRIL) 20 MG tablet Take 20 mg by mouth every morning.         . magnesium hydroxide (MILK OF MAGNESIA) 400 MG/5ML suspension Take by mouth daily.        . metFORMIN (GLUCOPHAGE) 850 MG tablet Take 850 mg by mouth 2 (two) times daily with a meal.       . mirtazapine (REMERON) 30 MG tablet Take 1 tablet (30 mg total) by mouth at bedtime.  90 tablet  0  . Multiple Vitamins-Minerals (CENTRUM PO) Take 1 tablet by mouth daily.       . ondansetron (ZOFRAN) 8 MG tablet Take 1 tablet (8 mg total) by mouth every 8 (eight) hours as needed for nausea. NAUSEA  20 tablet  3  . oxyCODONE (OXY IR/ROXICODONE) 5 MG immediate release tablet Take 1 tablet (5 mg total) by mouth every 4 (four) hours as needed. PAIN   15 tablet  0  . oxyCODONE (OXYCONTIN) 20 MG 12 hr tablet Take 1 tablet (20 mg total) by mouth every 12 (twelve) hours. rx given to pt  30 tablet  0  . pravastatin (PRAVACHOL) 20 MG tablet Take 20 mg by mouth at bedtime.       . ranitidine (ZANTAC) 150 MG tablet Take 150 mg by mouth 2 (two) times daily.       Marland Kitchen terazosin (HYTRIN) 2 MG capsule Take 2 mg by mouth at bedtime.        No current facility-administered medications for this visit.   Facility-Administered Medications Ordered in Other Visits  Medication Dose Route  Frequency Provider Last Rate Last Dose  . dexamethasone (DECADRON) injection 10 mg  10 mg Intravenous Once Amayrany Cafaro K. Jayvion Stefanski, MD   10 mg at 09/29/11 1323  . dextrose 5 % solution   Intravenous Once Daylyn Azbill K. Arbutus Ped, MD      . fluorouracil (ADRUCIL) 4,450 mg in sodium chloride 0.9 % 150 mL chemo infusion  2,400 mg/m2 (Treatment Plan Actual) Intravenous 1 day or 1 dose Shanterica Biehler K. Arbutus Ped, MD   4,450 mg at 08/18/11 1156  . fluorouracil (ADRUCIL) 4,450 mg in sodium chloride 0.9 % 150 mL chemo infusion  2,400 mg/m2 (Treatment Plan Actual) Intravenous 1 day or 1 dose Monaye Blackie K. Arbutus Ped, MD   4,450 mg at 09/29/11 1544  . fluorouracil (ADRUCIL) chemo injection 750 mg  400 mg/m2 (Treatment Plan Actual) Intravenous Once Jenipher Havel K. Joleene Burnham, MD   750 mg at  09/29/11 1543  . leucovorin 744 mg in dextrose 5 % 250 mL infusion  400 mg/m2 (Treatment Plan Actual) Intravenous Once Yasiel Goyne K. Shronda Boeh, MD   744 mg at 09/29/11 1340  . ondansetron (ZOFRAN) IVPB 8 mg  8 mg Intravenous Once Elisa Sorlie K. Shayli Altemose, MD   8 mg at 09/29/11 1321  . oxaliplatin (ELOXATIN) 160 mg in dextrose 5 % 500 mL chemo infusion  85 mg/m2 (Treatment Plan Actual) Intravenous Once Ilan Kahrs K. Arbutus Ped, MD   160 mg at 09/29/11 1340    SURGICAL HISTORY:  Past Surgical History  Procedure Date  . Appendectomy 62-89 years old  . Inguinal hernia repair as infant, age 62    LIH- infant, RIH age 76  . Pancreatectomy 06/2010    with splenectomy    REVIEW OF SYSTEMS:  A comprehensive review of systems was negative except for: Constitutional: positive for fatigue Gastrointestinal: positive for nausea   PHYSICAL EXAMINATION: General appearance: alert, cooperative and no distress Neck: no adenopathy Lymph nodes: Cervical, supraclavicular, and axillary nodes normal. Resp: clear to auscultation bilaterally Cardio: regular rate and rhythm, S1, S2 normal, no murmur, click, rub or gallop GI: soft, non-tender; bowel sounds normal; no masses,  no organomegaly Extremities: extremities normal, atraumatic, no cyanosis or edema  ECOG PERFORMANCE STATUS: 1 - Symptomatic but completely ambulatory  Blood pressure 126/72, pulse 108, temperature 97 F (36.1 C), temperature source Oral, weight 157 lb 11.2 oz (71.532 kg).  LABORATORY DATA: Lab Results  Component Value Date   WBC 5.2 09/29/2011   HGB 10.2* 09/29/2011   HCT 30.5* 09/29/2011   MCV 95.0 09/29/2011   PLT 182 09/29/2011      Chemistry      Component Value Date/Time   NA 132* 09/15/2011 1021   K 5.0 09/15/2011 1021   CL 98 09/15/2011 1021   CO2 26 09/15/2011 1021   BUN 16 09/15/2011 1021   CREATININE 0.95 09/15/2011 1021      Component Value Date/Time   CALCIUM 8.9 09/15/2011 1021   ALKPHOS 99 09/15/2011 1021   AST 28 09/15/2011 1021   ALT 21  09/15/2011 1021   BILITOT 0.6 09/15/2011 1021       RADIOGRAPHIC STUDIES: No results found.  ASSESSMENT: This is a very pleasant 77 years old white male with metastatic pancreatic adenocarcinoma currently on systemic chemotherapy with FOLFOX every 2 weeks is status post 4 cycles. The patient is rating his treatment fairly well.  PLAN: We'll proceed with cycle #5 today as scheduled. For constipation he was advised to continue on MiraLAX as well as the milk of magnesia. He was advised to call  immediately if he has any concerning symptoms after his chemotherapy. I would see him back for followup visit in 2 weeks with the start of cycle #6.   All questions were answered. The patient knows to call the clinic with any problems, questions or concerns. We can certainly see the patient much sooner if necessary.

## 2011-09-30 ENCOUNTER — Other Ambulatory Visit: Payer: Self-pay | Admitting: Certified Registered Nurse Anesthetist

## 2011-10-01 ENCOUNTER — Ambulatory Visit (HOSPITAL_BASED_OUTPATIENT_CLINIC_OR_DEPARTMENT_OTHER): Payer: Medicare Other

## 2011-10-01 VITALS — BP 139/78 | HR 82 | Temp 98.0°F

## 2011-10-01 DIAGNOSIS — C259 Malignant neoplasm of pancreas, unspecified: Secondary | ICD-10-CM

## 2011-10-01 MED ORDER — HEPARIN SOD (PORK) LOCK FLUSH 100 UNIT/ML IV SOLN
500.0000 [IU] | Freq: Once | INTRAVENOUS | Status: AC | PRN
  Administered 2011-10-01: 500 [IU]
  Filled 2011-10-01: qty 5

## 2011-10-01 MED ORDER — SODIUM CHLORIDE 0.9 % IJ SOLN
10.0000 mL | INTRAMUSCULAR | Status: DC | PRN
Start: 2011-10-01 — End: 2011-10-01
  Administered 2011-10-01: 10 mL
  Filled 2011-10-01: qty 10

## 2011-10-02 ENCOUNTER — Telehealth: Payer: Self-pay | Admitting: *Deleted

## 2011-10-02 NOTE — Telephone Encounter (Signed)
Please forward these information to Thelma Barge

## 2011-10-02 NOTE — Telephone Encounter (Signed)
Message retrieved from [facility] from [place] [place] to notify Dr. 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  All Treatment and care orders need to be faxed to them at [phone] five days ahead of the need of service for an authorization number.   **For example, he is to receive weekly chemotherapy and these request need to be sent a week in advance of the scheduled treatment.  "Just fax MD request, notes and the orders for whatever the doctor needs."  It takes a minimun of four days to get processed.   The [facility] can't care for him so this is a means for him to receive care.  They pay at the Medicare Rate and patient does not have a co-pay.  Would like the Business Office to know this.  If there is an emergency or we forget this procedure all we have to do is call ASAP [phone] ext [phone] and send a note .  The patient is also trying to work still per [employer].

## 2011-10-03 NOTE — Telephone Encounter (Signed)
Philip Shaw ,  Can you check into this

## 2011-10-07 ENCOUNTER — Other Ambulatory Visit: Payer: Self-pay | Admitting: *Deleted

## 2011-10-07 DIAGNOSIS — C3490 Malignant neoplasm of unspecified part of unspecified bronchus or lung: Secondary | ICD-10-CM

## 2011-10-07 MED ORDER — ONDANSETRON HCL 8 MG PO TABS: 8.0000 mg | ORAL_TABLET | Freq: Three times a day (TID) | ORAL | Status: DC | PRN

## 2011-10-07 NOTE — Telephone Encounter (Signed)
Pt's daughter called stating that they are having trouble with [employer] mail order and would prefer that they fill zofran and narcotics through his local pharmacy.  Called in new rx for zofran to the [facility] in [place].  SLJ

## 2011-10-13 ENCOUNTER — Ambulatory Visit (HOSPITAL_BASED_OUTPATIENT_CLINIC_OR_DEPARTMENT_OTHER): Payer: Medicare Other

## 2011-10-13 ENCOUNTER — Ambulatory Visit: Payer: Medicare Other

## 2011-10-13 ENCOUNTER — Other Ambulatory Visit: Payer: Medicare Other | Admitting: Lab

## 2011-10-13 ENCOUNTER — Ambulatory Visit: Payer: Medicare Other | Admitting: Internal Medicine

## 2011-10-13 VITALS — BP 105/70 | HR 113 | Temp 96.9°F | Ht 67.0 in | Wt 154.6 lb

## 2011-10-13 DIAGNOSIS — C259 Malignant neoplasm of pancreas, unspecified: Secondary | ICD-10-CM

## 2011-10-13 DIAGNOSIS — Z5111 Encounter for antineoplastic chemotherapy: Secondary | ICD-10-CM

## 2011-10-13 DIAGNOSIS — R52 Pain, unspecified: Secondary | ICD-10-CM

## 2011-10-13 LAB — CBC WITH DIFFERENTIAL/PLATELET
Basophils Absolute: 0 10*3/uL (ref 0.0–0.1)
Eosinophils Absolute: 0.1 10*3/uL (ref 0.0–0.5)
HCT: 30.7 % — ABNORMAL LOW (ref 38.4–49.9)
HGB: 10.6 g/dL — ABNORMAL LOW (ref 13.0–17.1)
LYMPH%: 15.6 % (ref 14.0–49.0)
MCV: 95.9 fL (ref 79.3–98.0)
MONO#: 1.8 10*3/uL — ABNORMAL HIGH (ref 0.1–0.9)
MONO%: 34.6 % — ABNORMAL HIGH (ref 0.0–14.0)
NEUT#: 2.5 10*3/uL (ref 1.5–6.5)
NEUT%: 47.8 % (ref 39.0–75.0)
Platelets: 197 10*3/uL (ref 140–400)
WBC: 5.1 10*3/uL (ref 4.0–10.3)

## 2011-10-13 LAB — COMPREHENSIVE METABOLIC PANEL
ALT: 24 U/L (ref 0–53)
CO2: 25 mEq/L (ref 19–32)
Calcium: 9.1 mg/dL (ref 8.4–10.5)
Chloride: 93 mEq/L — ABNORMAL LOW (ref 96–112)
Creatinine, Ser: 0.97 mg/dL (ref 0.50–1.35)
Glucose, Bld: 333 mg/dL — ABNORMAL HIGH (ref 70–99)
Total Bilirubin: 0.4 mg/dL (ref 0.3–1.2)
Total Protein: 6.5 g/dL (ref 6.0–8.3)

## 2011-10-13 LAB — MAGNESIUM: Magnesium: 1.7 mg/dL (ref 1.5–2.5)

## 2011-10-13 MED ORDER — HEPARIN SOD (PORK) LOCK FLUSH 100 UNIT/ML IV SOLN
500.0000 [IU] | Freq: Once | INTRAVENOUS | Status: DC | PRN
  Filled 2011-10-13: qty 5

## 2011-10-13 MED ORDER — LEUCOVORIN CALCIUM INJECTION 350 MG
400.0000 mg/m2 | Freq: Once | INTRAVENOUS | Status: AC
  Administered 2011-10-13: 744 mg via INTRAVENOUS
  Filled 2011-10-13: qty 37.2

## 2011-10-13 MED ORDER — FLUOROURACIL CHEMO INJECTION 2.5 GM/50ML
400.0000 mg/m2 | Freq: Once | INTRAVENOUS | Status: AC
  Administered 2011-10-13: 750 mg via INTRAVENOUS
  Filled 2011-10-13: qty 15

## 2011-10-13 MED ORDER — SODIUM CHLORIDE 0.9 % IV SOLN
2400.0000 mg/m2 | INTRAVENOUS | Status: DC
  Administered 2011-10-13: 4450 mg via INTRAVENOUS
  Filled 2011-10-13: qty 89

## 2011-10-13 MED ORDER — OXYCODONE HCL 20 MG PO TB12: 20.0000 mg | ORAL_TABLET | Freq: Two times a day (BID) | ORAL | Status: DC

## 2011-10-13 MED ORDER — SODIUM CHLORIDE 0.9 % IJ SOLN
10.0000 mL | INTRAMUSCULAR | Status: DC | PRN
  Filled 2011-10-13: qty 10

## 2011-10-13 MED ORDER — OXYCODONE HCL 5 MG PO TABS: 5.0000 mg | ORAL_TABLET | ORAL | Status: AC | PRN

## 2011-10-13 MED ORDER — DEXTROSE 5 % IV SOLN
Freq: Once | INTRAVENOUS | Status: AC
  Administered 2011-10-13: 13:00:00 via INTRAVENOUS

## 2011-10-13 MED ORDER — OXALIPLATIN CHEMO INJECTION 100 MG/20ML
85.0000 mg/m2 | Freq: Once | INTRAVENOUS | Status: AC
  Administered 2011-10-13: 160 mg via INTRAVENOUS
  Filled 2011-10-13: qty 32

## 2011-10-13 MED ORDER — ONDANSETRON 8 MG/50ML IVPB (CHCC)
8.0000 mg | Freq: Once | INTRAVENOUS | Status: AC
  Administered 2011-10-13: 8 mg via INTRAVENOUS

## 2011-10-13 MED ORDER — DEXAMETHASONE SODIUM PHOSPHATE 10 MG/ML IJ SOLN
10.0000 mg | Freq: Once | INTRAMUSCULAR | Status: AC
  Administered 2011-10-13: 10 mg via INTRAVENOUS

## 2011-10-13 NOTE — Progress Notes (Signed)
[facility] [phone]   [phone]  OFFICE PROGRESS NOTE  [name], MD, MD Dr. Burnell Blanks [address] [place] [address]  DIAGNOSIS: Metastatic pancreatic adenocarcinoma, now with metastatic disease to the liver and the abdominal wall, initially diagnosed with stage IIIB(T3, N1, M0) pancreatic tail adenocarcinoma in November of 2011.   PRIOR THERAPY:  #1 distal pancreatectomy and splenectomy on 06/25/2010  #2 adjuvant chemotherapy with gemcitabine started 08/20/2010 and completed 2 cycles on 12/02/2010.  #3 status post concurrent chemoradiation with Xeloda from 12/15/2010 on 10 01/24/2011.  #4 status post 2 more cycles of adjuvant gemcitabine with gemcitabine completed on 04/04/2011.   CURRENT THERAPY: FOLFOX giving every 2 weeks status post 5 cycles.    INTERVAL HISTORY: Philip Shaw 77 y.o. male returns to the clinic today for followup visit accompanied by his wife and son. The patient tolerated the last cycle of his chemotherapy fairly well with no significant complaints. He continues to use milk of magnesia for constipation. He has a bowel movement every day. He denied having any significant nausea or vomiting but continues to have occasional abdominal pain. His is here today to start cycle #6 of his chemotherapy.  MEDICAL HISTORY: Past Medical History  Diagnosis Date  . Cancer   . Heart murmur   . Abdominal pain, periumbilical   . Constipation     ALLERGIES:   has no known allergies.  MEDICATIONS:  Current Outpatient Prescriptions  Medication Sig Dispense Refill  . aspirin 81 MG tablet Take 81 mg by mouth every morning.       Tery Sanfilippo Sodium (COLACE PO) Take 2 tablets by mouth 2 (two) times daily.        Marland Kitchen lidocaine-prilocaine (EMLA) cream Apply 1 application topically as needed.  30 g  2  . lisinopril (PRINIVIL,ZESTRIL) 20 MG tablet Take 20 mg by mouth every morning.       . magnesium hydroxide  (MILK OF MAGNESIA) 400 MG/5ML suspension Take by mouth daily.        . metFORMIN (GLUCOPHAGE) 850 MG tablet Take 850 mg by mouth 2 (two) times daily with a meal.       . mirtazapine (REMERON) 30 MG tablet Take 1 tablet (30 mg total) by mouth at bedtime.  90 tablet  0  . Multiple Vitamins-Minerals (CENTRUM PO) Take 1 tablet by mouth daily.       . ondansetron (ZOFRAN) 8 MG tablet Take 1 tablet (8 mg total) by mouth every 8 (eight) hours as needed for nausea.  60 tablet  2  . oxyCODONE (OXY IR/ROXICODONE) 5 MG immediate release tablet Take 1 tablet (5 mg total) by mouth every 4 (four) hours as needed.  30 tablet  0  . oxyCODONE (OXYCONTIN) 20 MG 12 hr tablet Take 1 tablet (20 mg total) by mouth every 12 (twelve) hours.  60 tablet  0  . pravastatin (PRAVACHOL) 20 MG tablet Take 20 mg by mouth at bedtime.       . ranitidine (ZANTAC) 150 MG tablet Take 150 mg by mouth 2 (two) times daily.       Marland Kitchen terazosin (HYTRIN) 2 MG capsule Take 2 mg by mouth at bedtime.        No current facility-administered medications for this visit.   Facility-Administered Medications Ordered in Other Visits  Medication Dose Route Frequency Provider Last Rate Last Dose  . dexamethasone (DECADRON) injection 10 mg  10 mg Intravenous Once  Raceland Grosser K. Merlen Gurry, MD   10 mg at 10/13/11 1253  . dextrose 5 % solution   Intravenous Once Corion Sherrod K. Arbutus Ped, MD      . fluorouracil (ADRUCIL) 4,450 mg in sodium chloride 0.9 % 150 mL chemo infusion  2,400 mg/m2 (Treatment Plan Actual) Intravenous 1 day or 1 dose Rapheal Masso K. Arbutus Ped, MD   4,450 mg at 08/18/11 1156  . fluorouracil (ADRUCIL) chemo injection 750 mg  400 mg/m2 (Treatment Plan Actual) Intravenous Once Anala Whisenant K. Chay Mazzoni, MD   750 mg at 10/13/11 1551  . leucovorin 744 mg in dextrose 5 % 250 mL infusion  400 mg/m2 (Treatment Plan Actual) Intravenous Once Jkayla Spiewak K. Dorwin Fitzhenry, MD   744 mg at 10/13/11 1335  . ondansetron (ZOFRAN) IVPB 8 mg  8 mg Intravenous Once Whit Bruni K. Dejay Kronk, MD    8 mg at 10/13/11 1253  . oxaliplatin (ELOXATIN) 160 mg in dextrose 5 % 500 mL chemo infusion  85 mg/m2 (Treatment Plan Actual) Intravenous Once Bethlehem Langstaff K. Norah Devin, MD   160 mg at 10/13/11 1335  . DISCONTD: fluorouracil (ADRUCIL) 4,450 mg in sodium chloride 0.9 % 150 mL chemo infusion  2,400 mg/m2 (Treatment Plan Actual) Intravenous 1 day or 1 dose Lexia Vandevender K. Arbutus Ped, MD   4,450 mg at 10/13/11 1558  . DISCONTD: heparin lock flush 100 unit/mL  500 Units Intracatheter Once PRN Kenidee Cregan K. Stacy Deshler, MD      . DISCONTD: sodium chloride 0.9 % injection 10 mL  10 mL Intracatheter PRN Kiyon Fidalgo K. Arbutus Ped, MD        SURGICAL HISTORY:  Past Surgical History  Procedure Date  . Appendectomy 51-16 years old  . Inguinal hernia repair as infant, age 39    LIH- infant, RIH age 24  . Pancreatectomy 06/2010    with splenectomy    REVIEW OF SYSTEMS:  A comprehensive review of systems was negative.   PHYSICAL EXAMINATION: General appearance: alert, cooperative and no distress Neck: no adenopathy Lymph nodes: Cervical, supraclavicular, and axillary nodes normal. Resp: clear to auscultation bilaterally Cardio: regular rate and rhythm, S1, S2 normal, no murmur, click, rub or gallop GI: soft, non-tender; bowel sounds normal; no masses,  no organomegaly Extremities: extremities normal, atraumatic, no cyanosis or edema Neurologic: Alert and oriented X 3, normal strength and tone. Normal symmetric reflexes. Normal coordination and gait  ECOG PERFORMANCE STATUS: 1 - Symptomatic but completely ambulatory  Blood pressure 105/70, pulse 113, temperature 96.9 F (36.1 C), temperature source Oral, height 5\' 7"  (1.702 m), weight 154 lb 9.6 oz (70.126 kg).  LABORATORY DATA: Lab Results  Component Value Date   WBC 5.1 10/13/2011   HGB 10.6* 10/13/2011   HCT 30.7* 10/13/2011   MCV 95.9 10/13/2011   PLT 197 10/13/2011      Chemistry      Component Value Date/Time   NA 129* 10/13/2011 1106   K 5.1 10/13/2011 1106   CL  93* 10/13/2011 1106   CO2 25 10/13/2011 1106   BUN 18 10/13/2011 1106   CREATININE 0.97 10/13/2011 1106      Component Value Date/Time   CALCIUM 9.1 10/13/2011 1106   ALKPHOS 151* 10/13/2011 1106   AST 27 10/13/2011 1106   ALT 24 10/13/2011 1106   BILITOT 0.4 10/13/2011 1106       RADIOGRAPHIC STUDIES: No results found.  ASSESSMENT: This is a very pleasant 77 years old white male with metastatic pancreatic adenocarcinoma currently on systemic chemotherapy was FOLFOX status post 5 cycles. The patient is  doing fine and tolerating his chemotherapy fairly well.  PLAN: We will proceed with cycle #6 today as scheduled. He would come back for followup visit in 2 weeks with repeat CT scan of the chest, abdomen and pelvis for restaging of his disease. I gave the patient a refill of his OxyContin and oxycodone. The patient was advised to call me immediately if he has any concerning symptoms in the interval.  All questions were answered. The patient knows to call the clinic with any problems, questions or concerns. We can certainly see the patient much sooner if necessary.

## 2011-10-13 NOTE — Patient Instructions (Signed)
Call md for problems.  D Suzana Sohail rn 

## 2011-10-15 ENCOUNTER — Ambulatory Visit (HOSPITAL_BASED_OUTPATIENT_CLINIC_OR_DEPARTMENT_OTHER): Payer: Medicare Other

## 2011-10-15 VITALS — BP 105/68 | HR 112 | Temp 98.4°F

## 2011-10-15 DIAGNOSIS — Z452 Encounter for adjustment and management of vascular access device: Secondary | ICD-10-CM

## 2011-10-15 DIAGNOSIS — C259 Malignant neoplasm of pancreas, unspecified: Secondary | ICD-10-CM

## 2011-10-15 MED ORDER — HEPARIN SOD (PORK) LOCK FLUSH 100 UNIT/ML IV SOLN
500.0000 [IU] | Freq: Once | INTRAVENOUS | Status: AC | PRN
  Administered 2011-10-15: 500 [IU]
  Filled 2011-10-15: qty 5

## 2011-10-15 MED ORDER — SODIUM CHLORIDE 0.9 % IJ SOLN
10.0000 mL | INTRAMUSCULAR | Status: DC | PRN
  Administered 2011-10-15: 10 mL
  Filled 2011-10-15: qty 10

## 2011-10-24 ENCOUNTER — Ambulatory Visit (HOSPITAL_COMMUNITY)
Admission: RE | Admit: 2011-10-24 | Discharge: 2011-10-24 | Disposition: A | Discharge status: Home / Self Care | Payer: Medicare Other | Source: Ambulatory Visit | Attending: Internal Medicine | Admitting: Internal Medicine

## 2011-10-24 DIAGNOSIS — I2510 Atherosclerotic heart disease of native coronary artery without angina pectoris: Secondary | ICD-10-CM | POA: Insufficient documentation

## 2011-10-24 DIAGNOSIS — R918 Other nonspecific abnormal finding of lung field: Secondary | ICD-10-CM | POA: Insufficient documentation

## 2011-10-24 DIAGNOSIS — C259 Malignant neoplasm of pancreas, unspecified: Secondary | ICD-10-CM

## 2011-10-24 DIAGNOSIS — N200 Calculus of kidney: Secondary | ICD-10-CM | POA: Insufficient documentation

## 2011-10-24 DIAGNOSIS — C252 Malignant neoplasm of tail of pancreas: Secondary | ICD-10-CM | POA: Insufficient documentation

## 2011-10-24 DIAGNOSIS — K838 Other specified diseases of biliary tract: Secondary | ICD-10-CM | POA: Insufficient documentation

## 2011-10-24 DIAGNOSIS — C50919 Malignant neoplasm of unspecified site of unspecified female breast: Secondary | ICD-10-CM | POA: Insufficient documentation

## 2011-10-24 DIAGNOSIS — C779 Secondary and unspecified malignant neoplasm of lymph node, unspecified: Secondary | ICD-10-CM | POA: Insufficient documentation

## 2011-10-24 DIAGNOSIS — R52 Pain, unspecified: Secondary | ICD-10-CM

## 2011-10-24 DIAGNOSIS — C787 Secondary malignant neoplasm of liver and intrahepatic bile duct: Secondary | ICD-10-CM | POA: Insufficient documentation

## 2011-10-24 DIAGNOSIS — C786 Secondary malignant neoplasm of retroperitoneum and peritoneum: Secondary | ICD-10-CM | POA: Insufficient documentation

## 2011-10-26 ENCOUNTER — Other Ambulatory Visit: Payer: Self-pay | Admitting: Internal Medicine

## 2011-10-27 ENCOUNTER — Other Ambulatory Visit: Payer: BLUE CROSS/BLUE SHIELD | Admitting: Lab

## 2011-10-27 ENCOUNTER — Ambulatory Visit: Payer: BLUE CROSS/BLUE SHIELD | Admitting: Nutrition

## 2011-10-27 ENCOUNTER — Other Ambulatory Visit (HOSPITAL_COMMUNITY): Payer: Medicare Other

## 2011-10-27 ENCOUNTER — Ambulatory Visit (HOSPITAL_BASED_OUTPATIENT_CLINIC_OR_DEPARTMENT_OTHER): Payer: BLUE CROSS/BLUE SHIELD

## 2011-10-27 ENCOUNTER — Ambulatory Visit: Payer: Medicare Other | Admitting: Internal Medicine

## 2011-10-27 ENCOUNTER — Telehealth: Payer: Self-pay | Admitting: Internal Medicine

## 2011-10-27 VITALS — BP 109/71 | HR 101 | Temp 97.0°F | Ht 67.0 in | Wt 146.7 lb

## 2011-10-27 DIAGNOSIS — C259 Malignant neoplasm of pancreas, unspecified: Secondary | ICD-10-CM

## 2011-10-27 DIAGNOSIS — R5381 Other malaise: Secondary | ICD-10-CM

## 2011-10-27 DIAGNOSIS — R52 Pain, unspecified: Secondary | ICD-10-CM

## 2011-10-27 DIAGNOSIS — Z5111 Encounter for antineoplastic chemotherapy: Secondary | ICD-10-CM

## 2011-10-27 LAB — COMPREHENSIVE METABOLIC PANEL
ALT: 17 U/L (ref 0–53)
Alkaline Phosphatase: 161 U/L — ABNORMAL HIGH (ref 39–117)
BUN: 14 mg/dL (ref 6–23)
CO2: 22 mEq/L (ref 19–32)
Chloride: 90 mEq/L — ABNORMAL LOW (ref 96–112)
Creatinine, Ser: 0.85 mg/dL (ref 0.50–1.35)
Glucose, Bld: 517 mg/dL — ABNORMAL HIGH (ref 70–99)
Potassium: 5.2 mEq/L (ref 3.5–5.3)
Total Protein: 6.6 g/dL (ref 6.0–8.3)

## 2011-10-27 LAB — CBC WITH DIFFERENTIAL/PLATELET
BASO%: 0.6 % (ref 0.0–2.0)
Basophils Absolute: 0 10*3/uL (ref 0.0–0.1)
Eosinophils Absolute: 0 10*3/uL (ref 0.0–0.5)
HCT: 31.1 % — ABNORMAL LOW (ref 38.4–49.9)
HGB: 10.6 g/dL — ABNORMAL LOW (ref 13.0–17.1)
LYMPH%: 13.9 % — ABNORMAL LOW (ref 14.0–49.0)
MCHC: 34.1 g/dL (ref 32.0–36.0)
MONO#: 1.1 10*3/uL — ABNORMAL HIGH (ref 0.1–0.9)
NEUT#: 2.9 10*3/uL (ref 1.5–6.5)
NEUT%: 61.3 % (ref 39.0–75.0)
Platelets: 247 10*3/uL (ref 140–400)
WBC: 4.7 10*3/uL (ref 4.0–10.3)
lymph#: 0.7 10*3/uL — ABNORMAL LOW (ref 0.9–3.3)

## 2011-10-27 LAB — MAGNESIUM: Magnesium: 1.7 mg/dL (ref 1.5–2.5)

## 2011-10-27 MED ORDER — HEPARIN SOD (PORK) LOCK FLUSH 100 UNIT/ML IV SOLN
500.0000 [IU] | Freq: Once | INTRAVENOUS | Status: DC | PRN
  Filled 2011-10-27: qty 5

## 2011-10-27 MED ORDER — ONDANSETRON 8 MG/50ML IVPB (CHCC)
8.0000 mg | Freq: Once | INTRAVENOUS | Status: AC
  Administered 2011-10-27: 8 mg via INTRAVENOUS

## 2011-10-27 MED ORDER — SODIUM CHLORIDE 0.9 % IJ SOLN
10.0000 mL | INTRAMUSCULAR | Status: DC | PRN
  Filled 2011-10-27: qty 10

## 2011-10-27 MED ORDER — DEXAMETHASONE SODIUM PHOSPHATE 10 MG/ML IJ SOLN
10.0000 mg | Freq: Once | INTRAMUSCULAR | Status: AC
  Administered 2011-10-27: 10 mg via INTRAVENOUS

## 2011-10-27 MED ORDER — OXALIPLATIN CHEMO INJECTION 100 MG/20ML
85.0000 mg/m2 | Freq: Once | INTRAVENOUS | Status: AC
  Administered 2011-10-27: 160 mg via INTRAVENOUS
  Filled 2011-10-27: qty 32

## 2011-10-27 MED ORDER — LEUCOVORIN CALCIUM INJECTION 350 MG
400.0000 mg/m2 | Freq: Once | INTRAVENOUS | Status: AC
  Administered 2011-10-27: 744 mg via INTRAVENOUS
  Filled 2011-10-27: qty 37.2

## 2011-10-27 MED ORDER — SODIUM CHLORIDE 0.9 % IV SOLN
2400.0000 mg/m2 | INTRAVENOUS | Status: DC
  Administered 2011-10-27: 4450 mg via INTRAVENOUS
  Filled 2011-10-27: qty 89

## 2011-10-27 MED ORDER — FLUOROURACIL CHEMO INJECTION 2.5 GM/50ML
400.0000 mg/m2 | Freq: Once | INTRAVENOUS | Status: AC
  Administered 2011-10-27: 750 mg via INTRAVENOUS
  Filled 2011-10-27: qty 15

## 2011-10-27 MED ORDER — DEXTROSE 5 % IV SOLN
Freq: Once | INTRAVENOUS | Status: AC
  Administered 2011-10-27: 14:00:00 via INTRAVENOUS

## 2011-10-27 NOTE — Progress Notes (Signed)
Philip Shaw requested a nutrition followup today while he was in the chemotherapy area.  He is with his daughter today.  He reports that he has lost a significant amount of weight in the last month, and it appears it is about 16 pounds over the last 4 weeks.  He reports he has been very consistent in consuming 6 Ensure Plus or Boost Plus a day to provide approximately 2160 calories and 84 g of protein. That is about 29 calories per kg based on his 160-pound weight the 1st part January.  He is not all that interested in any other foods, although he does consume small amounts.  NUTRITION DIAGNOSIS:  Unintended weight loss continues.  INTERVENTION:  I have encouraged Mr. Chatmon to continue either Ensure Plus or Boost Plus but to increase to a total of 7 cans daily. In addition, I have recommended that he substitutes 2 cans of Ensure Complete for 2 of either Ensure Plus or Boost Plus that he is currently drinking, for 7 cans of supplement total to provide approximately 2450 calories and 91 g of protein. This is about 37 calories per kilogram.  It may slow down his weight loss as a result of his metastatic pancreatic cancer.  In addition, I provided 1 complimentary case of Ensure Plus for the patient. This is his 2nd case. He is eligible for 1 more, along with some samples of Ensure Complete for him to try.  I have educated him on where these products can be purchased. The patient and daughter are appreciative of information.  MONITORING/EVALUATION/GOALS:  The patient has been unable to increase oral intake to minimize further weight loss.  NEXT VISIT:  Monday, March 18th, during chemotherapy.    ______________________________ Zenovia Jarred, RD, LDN Clinical Nutrition Specialist BN/MEDQ  D:  10/27/2011  T:  10/27/2011  Job:  [id number]

## 2011-10-27 NOTE — Telephone Encounter (Signed)
Requested most recent scan, mri,

## 2011-10-27 NOTE — Patient Instructions (Signed)
Return Wed. At 3pm for pump dc

## 2011-10-28 ENCOUNTER — Telehealth: Payer: Self-pay | Admitting: Internal Medicine

## 2011-10-28 NOTE — Telephone Encounter (Signed)
Note for pt to p/u sch on 3/6   aom

## 2011-10-28 NOTE — Progress Notes (Signed)
[facility] [phone]   [phone]  OFFICE PROGRESS NOTE  [name], MD, MD Dr. Burnell Blanks [address] [place] [address]  DIAGNOSIS: Metastatic pancreatic adenocarcinoma, now with metastatic disease to the liver and the abdominal wall, initially diagnosed with stage IIIB(T3, N1, M0) pancreatic tail adenocarcinoma in November of 2011.   PRIOR THERAPY:  #1 distal pancreatectomy and splenectomy on 06/25/2010  #2 adjuvant chemotherapy with gemcitabine started 08/20/2010 and completed 2 cycles on 12/02/2010.  #3 status post concurrent chemoradiation with Xeloda from 12/15/2010 on 10 01/24/2011.  #4 status post 2 more cycles of adjuvant gemcitabine with gemcitabine completed on 04/04/2011.   CURRENT THERAPY: FOLFOX giving every 2 weeks status post 6 cycles.    INTERVAL HISTORY: Philip Shaw 77 y.o. male returns to the clinic today for followup visit accompanied his wife and daughter. The patient is feeling fine today with no specific complaints. He has a little bit more fatigue and weakness after the last cycle of his chemotherapy. He also lost few pounds since his last visit.  He had repeat CT scan of the chest, abdomen and pelvis performed recently and he is here today for evaluation and discussion of his scan results.  MEDICAL HISTORY: Past Medical History  Diagnosis Date  . Cancer   . Heart murmur   . Abdominal pain, periumbilical   . Constipation     ALLERGIES:   has no known allergies.  MEDICATIONS:  Current Outpatient Prescriptions  Medication Sig Dispense Refill  . aspirin 81 MG tablet Take 81 mg by mouth every morning.       Tery Sanfilippo Sodium (COLACE PO) Take 2 tablets by mouth 2 (two) times daily.        Marland Kitchen lidocaine-prilocaine (EMLA) cream Apply 1 application topically as needed.  30 g  2  . lisinopril (PRINIVIL,ZESTRIL) 20 MG tablet Take 20 mg by mouth every morning.       . magnesium hydroxide (MILK  OF MAGNESIA) 400 MG/5ML suspension Take by mouth daily.        . metFORMIN (GLUCOPHAGE) 850 MG tablet Take 850 mg by mouth 2 (two) times daily with a meal.       . mirtazapine (REMERON) 30 MG tablet Take 1 tablet (30 mg total) by mouth at bedtime.  90 tablet  0  . Multiple Vitamins-Minerals (CENTRUM PO) Take 1 tablet by mouth daily.       Marland Kitchen oxyCODONE (OXY IR/ROXICODONE) 5 MG immediate release tablet Take 1 tablet (5 mg total) by mouth every 4 (four) hours as needed.  30 tablet  0  . oxyCODONE (OXYCONTIN) 20 MG 12 hr tablet Take 1 tablet (20 mg total) by mouth every 12 (twelve) hours.  60 tablet  0  . pravastatin (PRAVACHOL) 20 MG tablet Take 20 mg by mouth at bedtime.       . ranitidine (ZANTAC) 150 MG tablet Take 150 mg by mouth 2 (two) times daily.       Marland Kitchen terazosin (HYTRIN) 2 MG capsule Take 2 mg by mouth at bedtime.        No current facility-administered medications for this visit.   Facility-Administered Medications Ordered in Other Visits  Medication Dose Route Frequency Provider Last Rate Last Dose  . dextrose 5 % solution   Intravenous Once Analucia Hush K. Alilah Mcmeans, MD      . fluorouracil (ADRUCIL) 4,450 mg in sodium chloride 0.9 % 150 mL chemo infusion  2,400 mg/m2 (  Treatment Plan Actual) Intravenous 1 day or 1 dose Haeli Gerlich K. Arbutus Ped, MD   4,450 mg at 08/18/11 1156  . fluorouracil (ADRUCIL) chemo injection 750 mg  400 mg/m2 (Treatment Plan Actual) Intravenous Once Kiyomi Pallo K. Cynthia Stainback, MD   750 mg at 10/27/11 1638  . leucovorin 744 mg in dextrose 5 % 250 mL infusion  400 mg/m2 (Treatment Plan Actual) Intravenous Once Loran Fleet K. Trudy Kory, MD   744 mg at 10/27/11 1427  . oxaliplatin (ELOXATIN) 160 mg in dextrose 5 % 500 mL chemo infusion  85 mg/m2 (Treatment Plan Actual) Intravenous Once Yechezkel Fertig K. Wellington Winegarden, MD   160 mg at 10/27/11 1427  . DISCONTD: fluorouracil (ADRUCIL) 4,450 mg in sodium chloride 0.9 % 150 mL chemo infusion  2,400 mg/m2 (Treatment Plan Actual) Intravenous 1 day or 1 dose Delesha Pohlman  K. Arbutus Ped, MD   4,450 mg at 10/27/11 1647  . DISCONTD: heparin lock flush 100 unit/mL  500 Units Intracatheter Once PRN Manar Smalling K. Fleurette Woolbright, MD      . DISCONTD: sodium chloride 0.9 % injection 10 mL  10 mL Intracatheter PRN Zylon Creamer K. Arbutus Ped, MD        SURGICAL HISTORY:  Past Surgical History  Procedure Date  . Appendectomy [age] years old  . Inguinal hernia repair as infant, age 61    LIH- infant, RIH age 22  . Pancreatectomy 06/2010    with splenectomy    REVIEW OF SYSTEMS:  A comprehensive review of systems was negative except for: Constitutional: positive for fatigue and weight loss Gastrointestinal: positive for abdominal pain, constipation and nausea   PHYSICAL EXAMINATION: General appearance: alert, cooperative and no distress Neck: no adenopathy Lymph nodes: Cervical, supraclavicular, and axillary nodes normal. Resp: clear to auscultation bilaterally Cardio: regular rate and rhythm, S1, S2 normal, no murmur, click, rub or gallop GI: abnormal findings:  tenderness at epigastric and RUQ. Genitalia: defer exam Extremities: extremities normal, atraumatic, no cyanosis or edema Neurologic: Alert and oriented X 3, normal strength and tone. Normal symmetric reflexes. Normal coordination and gait  ECOG PERFORMANCE STATUS: 1 - Symptomatic but completely ambulatory  Blood pressure 109/71, pulse 101, temperature 97 F (36.1 C), temperature source Oral, height 5\' 7"  (1.702 m), weight 146 lb 11.2 oz (66.543 kg).  LABORATORY DATA: Lab Results  Component Value Date   WBC 4.7 10/27/2011   HGB 10.6* 10/27/2011   HCT 31.1* 10/27/2011   MCV 96.9 10/27/2011   PLT 247 10/27/2011      Chemistry      Component Value Date/Time   NA 123* 10/27/2011 1200   K 5.2 10/27/2011 1200   CL 90* 10/27/2011 1200   CO2 22 10/27/2011 1200   BUN 14 10/27/2011 1200   CREATININE 0.85 10/27/2011 1200      Component Value Date/Time   CALCIUM 8.7 10/27/2011 1200   ALKPHOS 161* 10/27/2011 1200   AST 23 10/27/2011 1200   ALT  17 10/27/2011 1200   BILITOT 0.5 10/27/2011 1200       RADIOGRAPHIC STUDIES: Ct Chest W Contrast  10/28/2011  **ADDENDUM** CREATED: 10/28/2011 10:34:11  A second addendum by Dr. Reche Dixon on 10/28/2011.  The outside report of the MRI dated 06/24/2011 is now available.  The outside report describes areas of "fat necrosis / infarcts" within the left upper abdomen.  These areas are suspicious for omental/peritoneal disease on the current exam.  The left hepatic lesion described on image 174 of series 12 of the prior MRI is less conspicuous today in the left lobe  of the liver (vague hypoattenuation identified on images 50 and 51 of series 2). No well-defined lesion identified.  The lesion described on image 186 series 2 on the prior exam within the hepatic dome.  This is measured at 3 mm on the prior and corresponds to the 1.7 cm lesion on the current exam.  The area of abnormal right rectus/abdominal wall enhancement described on image 3 of series 13 of the prior MRI was measured at 3.2 x 1.8 cm.  On today's exam, subtle right medial rectus hyperenhancement is identified at 1.5 x 1.0 cm (image 83 series 2 of the current CT).  This suggests interval decrease in size, given cross modality comparison.  Combined with the outside MRI of 06/24/2011 and the outside report, findings suggest overall mixed response to therapy.  One liver lesion has enlarged, while another has decreased in size/conspicuity.  Suggestion of omental/peritoneal disease with slight interval decrease in size of a subtle right rectus muscle area of hyperenhancement.  Comparison is degraded by cross modality and outside film limitations.  **END ADDENDUM** SIGNED BY: Karn Cassis. Reche Dixon, M.D.    10/28/2011  **ADDENDUM** CREATED: 10/28/2011 10:23:26  Initial report by Dr. Reche Dixon.  Addendum by Dr. Reche Dixon.  The outside study from [facility], an abdominal MRI dated 06/24/2011, is now available.  The 1.7 cm lesion described in hepatic dome is less apparent  on the MRI, given cross modality comparison.  The questioned lesion in the posterior right hepatic lobe on image 52 of series two today is likely similar on the prior MRI.  Omental/peritoneal disease was present on the prior MRI.  Given cross modality comparison, favored to be slightly increased on the current CT.  Overall, suspicion of disease progression within the abdomen since 06/24/2011, given difficulties with cross modality comparison and comparison with outside study (outside report not available).  **END ADDENDUM** SIGNED BY: Karn Cassis. Reche Dixon, M.D.    10/24/2011  *RADIOLOGY REPORT*  Clinical Data:  Metastatic pancreatic adenocarcinoma.  Metastatic disease to the liver and abdominal wall.  Initially diagnosed within the tail November 2011.  CT CHEST, ABDOMEN AND PELVIS WITH CONTRAST  Technique: Contiguous axial images of the chest abdomen and pelvis were obtained after IV contrast administration.  Contrast: 100  ml Omnipaque-300  Comparison: MR abdomen of 06/30/2010.  CT abdomen pelvis 05/27/2010. Clinic note from [facility], which describes a 10/10/2010 chest, abdomen, and pelvic CT is also reviewed.  CT CHEST  Findings: Lung windows demonstrate mild nodularity at the right lung base on image 48 of series 5 which is new since 2011 and likely post infectious or inflammatory. Calcified granuloma measures 2 mm in the right lower lobe on image 38.  Minimal subpleural nodularity in the left lower lobe which was not imaged on the prior abdominal exam and is favored to be related to old granulomatous disease.  Soft tissue windows demonstrate a right-sided Port-A-Cath which terminates at the mid to lower right atrium.  Right chest wall subcutaneous lesion measures 1.3 cm and 30 HU on image 26. Tortuous mildly atherosclerotic descending thoracic aorta.  Normal heart size without pericardial or pleural effusion. Multivessel coronary artery atherosclerosis.  No central pulmonary embolism, on this  non-dedicated study.  No mediastinal or hilar adenopathy.  IMPRESSION:  1. No acute process or evidence of metastatic disease in the chest. 2.  Clustered right lower lobe lung nodules, favored to be post infectious or inflammatory.  These are new since 05/27/2010. 3. Multivessel coronary artery atherosclerosis. 4.  Probable sebaceous cyst about the right chest wall. 5.  Indeterminate left lung base nodule. Recommend attention on follow-up.  CT ABDOMEN AND PELVIS  Findings:  Hepatic dome peripherally enhancing 1.7 cm lesion on image 46 of series 2.  This is either new or increased from a 4 mm lesion on the prior.  There is also more anterior hepatic dome 4 mm lesion on image 48 which was not readily apparent on the prior. Vague 6 mm right hepatic lobe lesion on image 52 suspected.  Splenectomy.  Normal stomach.  Status post distal pancreatectomy.  No locally recurrent disease. The remaining pancreatic head, uncinate process are atrophic.  Mild duct dilatation involves the remaining pancreas.  This continues to the level of a calcification within the pancreatic duct.  This measures 5 mm on image 70 and is similar to on the prior (duct dilatation is new).  No acute pancreatitis identified.  A larger calcification, at 9 mm is identified just inferior to the pancreatic duct stone on image 71.  Normal gallbladder. Intrahepatic ducts upper normal. Common duct mildly dilated, 11 mm on image 40.  This is new since the preoperative study.  Normal adrenal glands.  Bilateral nonobstructive renal calculi. Bilateral renal cysts. No retroperitoneal or retrocrural adenopathy.  Sigmoid diverticulosis with muscular hypertrophy.  Normal terminal ileum.  Appendix not visualized.  No small bowel obstruction. No ascites.  There is omental nodularity, consistent with peritoneal disease. Ill-defined heterogeneous soft tissue density within the omentum measures 3.9 x 4.2 cm on image 63. An ill-defined central omental nodule measures 1.4 cm  on image 66.  No pelvic adenopathy.  Normal urinary bladder.  Mild prostatomegaly.  Fat containing left inguinal hernia. No significant free fluid.    Degenerative fusion of the bilateral sacroiliac joints. No acute osseous abnormality.  IMPRESSION:  1.  New or enlarged hepatic lesions, most consistent with metastatic disease. 2.  Omental/peritoneal metastasis. 3.  Interval resection of the pancreatic body/tail.  New pancreatic ductal dilatation throughout the remaining head, likely secondary to pancreatic duct stones.  No evidence of acute pancreatitis. 4.  Development of mild common duct and borderline intrahepatic ductal dilatation.  No definite obstructive stone.  Given normal bilirubin on 10/13/11, imaging surveillance would likely be appropriate. If more complete characterization is desired, MRCP may be informative. 5.  Nonobstructive renal calculi.  Original Report Authenticated By: Consuello Bossier, M.D.   Ct Abdomen Pelvis W Contrast  10/28/2011  **ADDENDUM** CREATED: 10/28/2011 10:34:11  A second addendum by Dr. Reche Dixon on 10/28/2011.  The outside report of the MRI dated 06/24/2011 is now available.  The outside report describes areas of "fat necrosis / infarcts" within the left upper abdomen.  These areas are suspicious for omental/peritoneal disease on the current exam.  The left hepatic lesion described on image 174 of series 12 of the prior MRI is less conspicuous today in the left lobe of the liver (vague hypoattenuation identified on images 50 and 51 of series 2). No well-defined lesion identified.  The lesion described on image 186 series 2 on the prior exam within the hepatic dome.  This is measured at 3 mm on the prior and corresponds to the 1.7 cm lesion on the current exam.  The area of abnormal right rectus/abdominal wall enhancement described on image 3 of series 13 of the prior MRI was measured at 3.2 x 1.8 cm.  On today's exam, subtle right medial rectus hyperenhancement is identified at 1.5 x  1.0 cm (image 83 series  2 of the current CT).  This suggests interval decrease in size, given cross modality comparison.  Combined with the outside MRI of 06/24/2011 and the outside report, findings suggest overall mixed response to therapy.  One liver lesion has enlarged, while another has decreased in size/conspicuity.  Suggestion of omental/peritoneal disease with slight interval decrease in size of a subtle right rectus muscle area of hyperenhancement.  Comparison is degraded by cross modality and outside film limitations.  **END ADDENDUM** SIGNED BY: Karn Cassis. Reche Dixon, M.D.    10/28/2011  **ADDENDUM** CREATED: 10/28/2011 10:23:26  Initial report by Dr. Reche Dixon.  Addendum by Dr. Reche Dixon.  The outside study from [facility], an abdominal MRI dated 06/24/2011, is now available.  The 1.7 cm lesion described in hepatic dome is less apparent on the MRI, given cross modality comparison.  The questioned lesion in the posterior right hepatic lobe on image 52 of series two today is likely similar on the prior MRI.  Omental/peritoneal disease was present on the prior MRI.  Given cross modality comparison, favored to be slightly increased on the current CT.  Overall, suspicion of disease progression within the abdomen since 06/24/2011, given difficulties with cross modality comparison and comparison with outside study (outside report not available).  **END ADDENDUM** SIGNED BY: Karn Cassis. Reche Dixon, M.D.    10/24/2011  *RADIOLOGY REPORT*  Clinical Data:  Metastatic pancreatic adenocarcinoma.  Metastatic disease to the liver and abdominal wall.  Initially diagnosed within the tail November 2011.  CT CHEST, ABDOMEN AND PELVIS WITH CONTRAST  Technique: Contiguous axial images of the chest abdomen and pelvis were obtained after IV contrast administration.  Contrast: 100  ml Omnipaque-300  Comparison: MR abdomen of 06/30/2010.  CT abdomen pelvis 05/27/2010. Clinic note from [facility], which describes a 10/10/2010  chest, abdomen, and pelvic CT is also reviewed.  CT CHEST  Findings: Lung windows demonstrate mild nodularity at the right lung base on image 48 of series 5 which is new since 2011 and likely post infectious or inflammatory. Calcified granuloma measures 2 mm in the right lower lobe on image 38.  Minimal subpleural nodularity in the left lower lobe which was not imaged on the prior abdominal exam and is favored to be related to old granulomatous disease.  Soft tissue windows demonstrate a right-sided Port-A-Cath which terminates at the mid to lower right atrium.  Right chest wall subcutaneous lesion measures 1.3 cm and 30 HU on image 26. Tortuous mildly atherosclerotic descending thoracic aorta.  Normal heart size without pericardial or pleural effusion. Multivessel coronary artery atherosclerosis.  No central pulmonary embolism, on this non-dedicated study.  No mediastinal or hilar adenopathy.  IMPRESSION:  1. No acute process or evidence of metastatic disease in the chest. 2.  Clustered right lower lobe lung nodules, favored to be post infectious or inflammatory.  These are new since 05/27/2010. 3. Multivessel coronary artery atherosclerosis. 4.  Probable sebaceous cyst about the right chest wall. 5.  Indeterminate left lung base nodule. Recommend attention on follow-up.  CT ABDOMEN AND PELVIS  Findings:  Hepatic dome peripherally enhancing 1.7 cm lesion on image 46 of series 2.  This is either new or increased from a 4 mm lesion on the prior.  There is also more anterior hepatic dome 4 mm lesion on image 48 which was not readily apparent on the prior. Vague 6 mm right hepatic lobe lesion on image 52 suspected.  Splenectomy.  Normal stomach.  Status post distal pancreatectomy.  No locally recurrent  disease. The remaining pancreatic head, uncinate process are atrophic.  Mild duct dilatation involves the remaining pancreas.  This continues to the level of a calcification within the pancreatic duct.  This measures 5  mm on image 70 and is similar to on the prior (duct dilatation is new).  No acute pancreatitis identified.  A larger calcification, at 9 mm is identified just inferior to the pancreatic duct stone on image 71.  Normal gallbladder. Intrahepatic ducts upper normal. Common duct mildly dilated, 11 mm on image 40.  This is new since the preoperative study.  Normal adrenal glands.  Bilateral nonobstructive renal calculi. Bilateral renal cysts. No retroperitoneal or retrocrural adenopathy.  Sigmoid diverticulosis with muscular hypertrophy.  Normal terminal ileum.  Appendix not visualized.  No small bowel obstruction. No ascites.  There is omental nodularity, consistent with peritoneal disease. Ill-defined heterogeneous soft tissue density within the omentum measures 3.9 x 4.2 cm on image 63. An ill-defined central omental nodule measures 1.4 cm on image 66.  No pelvic adenopathy.  Normal urinary bladder.  Mild prostatomegaly.  Fat containing left inguinal hernia. No significant free fluid.    Degenerative fusion of the bilateral sacroiliac joints. No acute osseous abnormality.  IMPRESSION:  1.  New or enlarged hepatic lesions, most consistent with metastatic disease. 2.  Omental/peritoneal metastasis. 3.  Interval resection of the pancreatic body/tail.  New pancreatic ductal dilatation throughout the remaining head, likely secondary to pancreatic duct stones.  No evidence of acute pancreatitis. 4.  Development of mild common duct and borderline intrahepatic ductal dilatation.  No definite obstructive stone.  Given normal bilirubin on 10/13/11, imaging surveillance would likely be appropriate. If more complete characterization is desired, MRCP may be informative. 5.  Nonobstructive renal calculi.  Original Report Authenticated By: Consuello Bossier, M.D.    ASSESSMENT: This is a very pleasant 77 years old white male with metastatic pancreatic adenocarcinoma currently on treatment with FOLFOX x6 cycles. The patient has mixed  response to this treatment. I discussed the scan results with the patient and his family before the final comparison was made.    PLAN: I recommended for him to continue on treatment with FOLFOX for now. He will receive cycle #7 today as scheduled. I will call the patient with the final results and decision regarding his treatment.  For pain management the patient will continue on OxyContin 20 mg every 12 hours in addition to oxycodone on as needed basis. He would come back for followup visit in 2 weeks with the next cycle of his chemotherapy. The patient and his family agreed to the current plan. I called Mr. Minney on 10/28/11 and updated him on the final scan results after the final comparison. We will continue treatment with Folfox for 5 more cycles after his cycle yesterday. He agreed.  All questions were answered. The patient knows to call the clinic with any problems, questions or concerns. We can certainly see the patient much sooner if necessary.  I spent 20 minutes counseling the patient face to face. The total time spent in the appointment was 40 minutes.

## 2011-10-29 ENCOUNTER — Ambulatory Visit (HOSPITAL_BASED_OUTPATIENT_CLINIC_OR_DEPARTMENT_OTHER): Payer: BLUE CROSS/BLUE SHIELD

## 2011-10-29 VITALS — BP 105/72 | HR 112 | Temp 97.5°F

## 2011-10-29 DIAGNOSIS — C259 Malignant neoplasm of pancreas, unspecified: Secondary | ICD-10-CM

## 2011-10-29 DIAGNOSIS — Z469 Encounter for fitting and adjustment of unspecified device: Secondary | ICD-10-CM

## 2011-10-29 MED ORDER — HEPARIN SOD (PORK) LOCK FLUSH 100 UNIT/ML IV SOLN
500.0000 [IU] | Freq: Once | INTRAVENOUS | Status: AC | PRN
  Administered 2011-10-29: 500 [IU]
  Filled 2011-10-29: qty 5

## 2011-10-29 MED ORDER — SODIUM CHLORIDE 0.9 % IJ SOLN
10.0000 mL | INTRAMUSCULAR | Status: DC | PRN
  Administered 2011-10-29: 10 mL
  Filled 2011-10-29: qty 10

## 2011-10-29 NOTE — Patient Instructions (Signed)
Call MD for problems 

## 2011-11-03 ENCOUNTER — Ambulatory Visit: Payer: Medicare Other | Admitting: Internal Medicine

## 2011-11-04 ENCOUNTER — Telehealth: Payer: Self-pay | Admitting: Medical Oncology

## 2011-11-04 NOTE — Telephone Encounter (Signed)
Called and left message that pt has high glucose . 1540 I called pt back and she spoke to Primary care who adjusted his meds. His primary care discontinued his metformin and started him on glimiperide

## 2011-11-10 ENCOUNTER — Ambulatory Visit: Payer: Medicare Other | Admitting: Nutrition

## 2011-11-10 ENCOUNTER — Other Ambulatory Visit (HOSPITAL_BASED_OUTPATIENT_CLINIC_OR_DEPARTMENT_OTHER): Payer: Medicare Other | Admitting: Lab

## 2011-11-10 ENCOUNTER — Ambulatory Visit (HOSPITAL_BASED_OUTPATIENT_CLINIC_OR_DEPARTMENT_OTHER): Payer: Medicare Other | Admitting: Internal Medicine

## 2011-11-10 ENCOUNTER — Telehealth: Payer: Self-pay | Admitting: Internal Medicine

## 2011-11-10 ENCOUNTER — Ambulatory Visit (HOSPITAL_BASED_OUTPATIENT_CLINIC_OR_DEPARTMENT_OTHER): Payer: Medicare Other

## 2011-11-10 VITALS — BP 93/66 | HR 108 | Temp 97.1°F | Ht 67.0 in | Wt 144.2 lb

## 2011-11-10 DIAGNOSIS — C779 Secondary and unspecified malignant neoplasm of lymph node, unspecified: Secondary | ICD-10-CM

## 2011-11-10 DIAGNOSIS — C259 Malignant neoplasm of pancreas, unspecified: Secondary | ICD-10-CM

## 2011-11-10 DIAGNOSIS — C252 Malignant neoplasm of tail of pancreas: Secondary | ICD-10-CM

## 2011-11-10 DIAGNOSIS — Z5111 Encounter for antineoplastic chemotherapy: Secondary | ICD-10-CM

## 2011-11-10 DIAGNOSIS — C787 Secondary malignant neoplasm of liver and intrahepatic bile duct: Secondary | ICD-10-CM

## 2011-11-10 DIAGNOSIS — C50919 Malignant neoplasm of unspecified site of unspecified female breast: Secondary | ICD-10-CM

## 2011-11-10 LAB — COMPREHENSIVE METABOLIC PANEL
AST: 33 U/L (ref 0–37)
BUN: 28 mg/dL — ABNORMAL HIGH (ref 6–23)
Calcium: 8.6 mg/dL (ref 8.4–10.5)
Chloride: 86 mEq/L — ABNORMAL LOW (ref 96–112)
Creatinine, Ser: 0.84 mg/dL (ref 0.50–1.35)
Glucose, Bld: 355 mg/dL — ABNORMAL HIGH (ref 70–99)

## 2011-11-10 LAB — CBC WITH DIFFERENTIAL/PLATELET
BASO%: 0.2 % (ref 0.0–2.0)
Basophils Absolute: 0 10*3/uL (ref 0.0–0.1)
HCT: 30.2 % — ABNORMAL LOW (ref 38.4–49.9)
LYMPH%: 12.1 % — ABNORMAL LOW (ref 14.0–49.0)
MCH: 33.9 pg — ABNORMAL HIGH (ref 27.2–33.4)
MCHC: 35.4 g/dL (ref 32.0–36.0)
MONO#: 1 10*3/uL — ABNORMAL HIGH (ref 0.1–0.9)
NEUT%: 69.9 % (ref 39.0–75.0)
Platelets: 153 10*3/uL (ref 140–400)
WBC: 5.6 10*3/uL (ref 4.0–10.3)

## 2011-11-10 LAB — MAGNESIUM: Magnesium: 2 mg/dL (ref 1.5–2.5)

## 2011-11-10 MED ORDER — DEXTROSE 5 % IV SOLN
Freq: Once | INTRAVENOUS | Status: AC
  Administered 2011-11-10: 12:00:00 via INTRAVENOUS

## 2011-11-10 MED ORDER — ONDANSETRON 8 MG/50ML IVPB (CHCC)
8.0000 mg | Freq: Once | INTRAVENOUS | Status: AC
  Administered 2011-11-10: 8 mg via INTRAVENOUS

## 2011-11-10 MED ORDER — OXALIPLATIN CHEMO INJECTION 100 MG/20ML
85.0000 mg/m2 | Freq: Once | INTRAVENOUS | Status: AC
  Administered 2011-11-10: 160 mg via INTRAVENOUS
  Filled 2011-11-10: qty 32

## 2011-11-10 MED ORDER — SODIUM CHLORIDE 0.9 % IV SOLN
2400.0000 mg/m2 | INTRAVENOUS | Status: DC
  Administered 2011-11-10: 4450 mg via INTRAVENOUS
  Filled 2011-11-10: qty 89

## 2011-11-10 MED ORDER — FLUOROURACIL CHEMO INJECTION 2.5 GM/50ML
400.0000 mg/m2 | Freq: Once | INTRAVENOUS | Status: AC
  Administered 2011-11-10: 750 mg via INTRAVENOUS
  Filled 2011-11-10: qty 15

## 2011-11-10 MED ORDER — LEUCOVORIN CALCIUM INJECTION 350 MG
400.0000 mg/m2 | Freq: Once | INTRAVENOUS | Status: AC
  Administered 2011-11-10: 744 mg via INTRAVENOUS
  Filled 2011-11-10: qty 37.2

## 2011-11-10 MED ORDER — DEXAMETHASONE SODIUM PHOSPHATE 10 MG/ML IJ SOLN
10.0000 mg | Freq: Once | INTRAMUSCULAR | Status: AC
  Administered 2011-11-10: 10 mg via INTRAVENOUS

## 2011-11-10 NOTE — Progress Notes (Signed)
I spoke to [name] and his daughter in the chemotherapy area.  She reports that [name] stopped taking his metformin, started drinking a lot of juice and ended up with hyperglycemia and severe weight loss. She reports that the patient is now on Lantus insulin and now working at getting his blood sugars better controlled.  He is back to drinking protein shakes using milk and a protein powder and some fruit.  He has started to gain some weight back.  His blood sugars are better controlled.  He continues to be uninterested in food.  NUTRITION DIAGNOSIS:  Unintended weight loss continues.  INTERVENTION:  I have provided support and encouragement to [name] and his daughter regarding the plan for nutrition.  For now I support their choice of using a protein powder with milk and fruit.  Eventually their plan is to consume Ensure Plus or Boost Plus again in a shake.  MONITORING, EVALUATION AND GOALS:  The patient has struggled to maintain oral intake and has experienced severe weight loss secondary to hyperglycemia and poor oral intake.  He is now increasing his oral intake and achieving improved glycemic control.  NEXT VISIT:  I have not scheduled a followup with the patient at this time.  I will continue to work with the patient and daughter as needed.    ______________________________ Philip Shaw, Philip Shaw, Philip Shaw Clinical Nutrition Specialist BN/MEDQ  D:  11/10/2011  T:  11/10/2011  Job:  [id number]

## 2011-11-10 NOTE — Patient Instructions (Signed)
[facility] Discharge Instructions for Patients Receiving Chemotherapy  Today you received the following chemotherapy agents leucovorin, oxaliplatin, 32fu  To help prevent nausea and vomiting after your treatment, we encourage you to take your nausea medication}  take it as often as prescribed .   If you develop nausea and vomiting that is not controlled by your nausea medication, call the clinic. If it is after clinic hours your family physician or the after hours number for the clinic or go to the Emergency Department.   BELOW ARE SYMPTOMS THAT SHOULD BE REPORTED IMMEDIATELY:  *FEVER GREATER THAN 100.5 F  *CHILLS WITH OR WITHOUT FEVER  NAUSEA AND VOMITING THAT IS NOT CONTROLLED WITH YOUR NAUSEA MEDICATION  *UNUSUAL SHORTNESS OF BREATH  *UNUSUAL BRUISING OR BLEEDING  TENDERNESS IN MOUTH AND THROAT WITH OR WITHOUT PRESENCE OF ULCERS  *URINARY PROBLEMS  *BOWEL PROBLEMS  UNUSUAL RASH Items with * indicate a potential emergency and should be followed up as soon as possible.  One of the nurses will contact you 24 hours after your treatment. Please let the nurse know about any problems that you may have experienced. Feel free to call the clinic you have any questions or concerns. The clinic phone number is [phone].   I have been informed and understand all the instructions given to me. I know to contact the clinic, my physician, or go to the Emergency Department if any problems should occur. I do not have any questions at this time, but understand that I may call the clinic during office hours   should I have any questions or need assistance in obtaining follow up care.    __________________________________________  _____________  __________ Signature of Patient or Authorized Representative            Date                   Time    __________________________________________ Nurse's Signature

## 2011-11-10 NOTE — Telephone Encounter (Signed)
gv pts daughter appt for april2013

## 2011-11-10 NOTE — Progress Notes (Signed)
[facility] Telephone:([phone]) [phone]   Fax:([phone]) [phone]  OFFICE PROGRESS NOTE  [name], MD, MD Dr. Burnell Blanks [address] [place] [address]  DIAGNOSIS: Metastatic pancreatic adenocarcinoma, now with metastatic disease to the liver and the abdominal wall, initially diagnosed with stage IIIB(T3, N1, M0) pancreatic tail adenocarcinoma in November of 2011.   PRIOR THERAPY:  #1 distal pancreatectomy and splenectomy on 06/25/2010  #2 adjuvant chemotherapy with gemcitabine started 08/20/2010 and completed 2 cycles on 12/02/2010.  #3 status post concurrent chemoradiation with Xeloda from 12/15/2010 on 10 01/24/2011.  #4 status post 2 more cycles of adjuvant gemcitabine with gemcitabine completed on 04/04/2011.   CURRENT THERAPY: FOLFOX giving every 2 weeks status post 7 cycles.    INTERVAL HISTORY: Philip Shaw 77 y.o. male returns to the clinic today for followup visit accompanied by his daughter. The patient tolerated the last cycle of the chemotherapy fairly well except for increasing fatigue. He denied having any significant nausea or vomiting. No significant abdominal pain.  He still taking OxyContin 20 mg by mouth every 12 hours. He does not require any oxycodone for breakthrough pain. He spent a week with his daughter in [place] and he gained around 7 pounds during this time. The patient also was started on Lantus by his primary care because of uncontrolled blood glucose which at times was over 500. Otherwise no specific complaints today.  MEDICAL HISTORY: Past Medical History  Diagnosis Date  . Cancer   . Heart murmur   . Abdominal pain, periumbilical   . Constipation     ALLERGIES:   has no known allergies.  MEDICATIONS:  Current Outpatient Prescriptions  Medication Sig Dispense Refill  . aspirin 81 MG tablet Take 81 mg by mouth every morning.       . insulin glargine (LANTUS) 100 UNIT/ML injection Inject into the skin at  bedtime. 27 units, sliding scale      . lidocaine-prilocaine (EMLA) cream Apply 1 application topically as needed.  30 g  2  . lisinopril (PRINIVIL,ZESTRIL) 20 MG tablet Take 20 mg by mouth every morning.       . magnesium hydroxide (MILK OF MAGNESIA) 400 MG/5ML suspension Take by mouth daily.        . mirtazapine (REMERON) 30 MG tablet Take 1 tablet (30 mg total) by mouth at bedtime.  90 tablet  0  . oxyCODONE (OXY IR/ROXICODONE) 5 MG immediate release tablet Take 1 tablet (5 mg total) by mouth every 4 (four) hours as needed.  30 tablet  0  . oxyCODONE (OXYCONTIN) 20 MG 12 hr tablet Take 1 tablet (20 mg total) by mouth every 12 (twelve) hours.  60 tablet  0  . pravastatin (PRAVACHOL) 20 MG tablet Take 20 mg by mouth at bedtime.       . ranitidine (ZANTAC) 150 MG tablet Take 150 mg by mouth 2 (two) times daily.       Marland Kitchen terazosin (HYTRIN) 2 MG capsule Take 2 mg by mouth at bedtime.       Tery Sanfilippo Sodium (COLACE PO) Take 2 tablets by mouth 2 (two) times daily.        . Multiple Vitamins-Minerals (CENTRUM PO) Take 1 tablet by mouth daily.        No current facility-administered medications for this visit.   Facility-Administered Medications Ordered in Other Visits  Medication Dose Route Frequency Provider Last Rate Last Dose  . fluorouracil (ADRUCIL) 4,450 mg in sodium  chloride 0.9 % 150 mL chemo infusion  2,400 mg/m2 (Treatment Plan Actual) Intravenous 1 day or 1 dose Si Gaul, MD   4,450 mg at 08/18/11 1156    SURGICAL HISTORY:  Past Surgical History  Procedure Date  . Appendectomy 67-49 years old  . Inguinal hernia repair as infant, age 48    LIH- infant, RIH age 10  . Pancreatectomy 06/2010    with splenectomy    REVIEW OF SYSTEMS:  A comprehensive review of systems was negative except for: Constitutional: positive for anorexia and fatigue   PHYSICAL EXAMINATION: General appearance: alert, cooperative and no distress Head: Normocephalic, without obvious abnormality,  atraumatic Neck: no adenopathy Lymph nodes: Cervical, supraclavicular, and axillary nodes normal. Resp: clear to auscultation bilaterally Cardio: regular rate and rhythm, S1, S2 normal, no murmur, click, rub or gallop GI: soft, non-tender; bowel sounds normal; no masses,  no organomegaly Extremities: extremities normal, atraumatic, no cyanosis or edema Neurologic: Alert and oriented X 3, normal strength and tone. Normal symmetric reflexes. Normal coordination and gait  ECOG PERFORMANCE STATUS: 1 - Symptomatic but completely ambulatory  Blood pressure 93/66, pulse 108, temperature 97.1 F (36.2 C), temperature source Oral, height 5\' 7"  (1.702 m), weight 144 lb 3.2 oz (65.409 kg).  LABORATORY DATA: Lab Results  Component Value Date   WBC 5.6 11/10/2011   HGB 10.7* 11/10/2011   HCT 30.2* 11/10/2011   MCV 95.6 11/10/2011   PLT 153 11/10/2011      Chemistry      Component Value Date/Time   NA 123* 10/27/2011 1200   K 5.2 10/27/2011 1200   CL 90* 10/27/2011 1200   CO2 22 10/27/2011 1200   BUN 14 10/27/2011 1200   CREATININE 0.85 10/27/2011 1200      Component Value Date/Time   CALCIUM 8.7 10/27/2011 1200   ALKPHOS 161* 10/27/2011 1200   AST 23 10/27/2011 1200   ALT 17 10/27/2011 1200   BILITOT 0.5 10/27/2011 1200       RADIOGRAPHIC STUDIES: Ct Chest W Contrast  10/28/2011  **ADDENDUM** CREATED: 10/28/2011 10:34:11  A second addendum by Dr. Reche Dixon on 10/28/2011.  The outside report of the MRI dated 06/24/2011 is now available.  The outside report describes areas of "fat necrosis / infarcts" within the left upper abdomen.  These areas are suspicious for omental/peritoneal disease on the current exam.  The left hepatic lesion described on image 174 of series 12 of the prior MRI is less conspicuous today in the left lobe of the liver (vague hypoattenuation identified on images 50 and 51 of series 2). No well-defined lesion identified.  The lesion described on image 186 series 2 on the prior exam within the  hepatic dome.  This is measured at 3 mm on the prior and corresponds to the 1.7 cm lesion on the current exam.  The area of abnormal right rectus/abdominal wall enhancement described on image 3 of series 13 of the prior MRI was measured at 3.2 x 1.8 cm.  On today's exam, subtle right medial rectus hyperenhancement is identified at 1.5 x 1.0 cm (image 83 series 2 of the current CT).  This suggests interval decrease in size, given cross modality comparison.  Combined with the outside MRI of 06/24/2011 and the outside report, findings suggest overall mixed response to therapy.  One liver lesion has enlarged, while another has decreased in size/conspicuity.  Suggestion of omental/peritoneal disease with slight interval decrease in size of a subtle right rectus muscle area of hyperenhancement.  Comparison is degraded by cross modality and outside film limitations.  **END ADDENDUM** SIGNED BY: Karn Cassis. Reche Dixon, M.D.    10/28/2011  **ADDENDUM** CREATED: 10/28/2011 10:23:26  Initial report by Dr. Reche Dixon.  Addendum by Dr. Reche Dixon.  The outside study from [facility], an abdominal MRI dated 06/24/2011, is now available.  The 1.7 cm lesion described in hepatic dome is less apparent on the MRI, given cross modality comparison.  The questioned lesion in the posterior right hepatic lobe on image 52 of series two today is likely similar on the prior MRI.  Omental/peritoneal disease was present on the prior MRI.  Given cross modality comparison, favored to be slightly increased on the current CT.  Overall, suspicion of disease progression within the abdomen since 06/24/2011, given difficulties with cross modality comparison and comparison with outside study (outside report not available).  **END ADDENDUM** SIGNED BY: Karn Cassis. Reche Dixon, M.D.    10/24/2011  *RADIOLOGY REPORT*  Clinical Data:  Metastatic pancreatic adenocarcinoma.  Metastatic disease to the liver and abdominal wall.  Initially diagnosed within the tail November  2011.  CT CHEST, ABDOMEN AND PELVIS WITH CONTRAST  Technique: Contiguous axial images of the chest abdomen and pelvis were obtained after IV contrast administration.  Contrast: 100  ml Omnipaque-300  Comparison: MR abdomen of 06/30/2010.  CT abdomen pelvis 05/27/2010. Clinic note from [facility], which describes a 10/10/2010 chest, abdomen, and pelvic CT is also reviewed.  CT CHEST  Findings: Lung windows demonstrate mild nodularity at the right lung base on image 48 of series 5 which is new since 2011 and likely post infectious or inflammatory. Calcified granuloma measures 2 mm in the right lower lobe on image 38.  Minimal subpleural nodularity in the left lower lobe which was not imaged on the prior abdominal exam and is favored to be related to old granulomatous disease.  Soft tissue windows demonstrate a right-sided Port-A-Cath which terminates at the mid to lower right atrium.  Right chest wall subcutaneous lesion measures 1.3 cm and 30 HU on image 26. Tortuous mildly atherosclerotic descending thoracic aorta.  Normal heart size without pericardial or pleural effusion. Multivessel coronary artery atherosclerosis.  No central pulmonary embolism, on this non-dedicated study.  No mediastinal or hilar adenopathy.  IMPRESSION:  1. No acute process or evidence of metastatic disease in the chest. 2.  Clustered right lower lobe lung nodules, favored to be post infectious or inflammatory.  These are new since 05/27/2010. 3. Multivessel coronary artery atherosclerosis. 4.  Probable sebaceous cyst about the right chest wall. 5.  Indeterminate left lung base nodule. Recommend attention on follow-up.  CT ABDOMEN AND PELVIS  Findings:  Hepatic dome peripherally enhancing 1.7 cm lesion on image 46 of series 2.  This is either new or increased from a 4 mm lesion on the prior.  There is also more anterior hepatic dome 4 mm lesion on image 48 which was not readily apparent on the prior. Vague 6 mm right hepatic lobe  lesion on image 52 suspected.  Splenectomy.  Normal stomach.  Status post distal pancreatectomy.  No locally recurrent disease. The remaining pancreatic head, uncinate process are atrophic.  Mild duct dilatation involves the remaining pancreas.  This continues to the level of a calcification within the pancreatic duct.  This measures 5 mm on image 70 and is similar to on the prior (duct dilatation is new).  No acute pancreatitis identified.  A larger calcification, at 9 mm is identified just inferior to the  pancreatic duct stone on image 71.  Normal gallbladder. Intrahepatic ducts upper normal. Common duct mildly dilated, 11 mm on image 40.  This is new since the preoperative study.  Normal adrenal glands.  Bilateral nonobstructive renal calculi. Bilateral renal cysts. No retroperitoneal or retrocrural adenopathy.  Sigmoid diverticulosis with muscular hypertrophy.  Normal terminal ileum.  Appendix not visualized.  No small bowel obstruction. No ascites.  There is omental nodularity, consistent with peritoneal disease. Ill-defined heterogeneous soft tissue density within the omentum measures 3.9 x 4.2 cm on image 63. An ill-defined central omental nodule measures 1.4 cm on image 66.  No pelvic adenopathy.  Normal urinary bladder.  Mild prostatomegaly.  Fat containing left inguinal hernia. No significant free fluid.    Degenerative fusion of the bilateral sacroiliac joints. No acute osseous abnormality.  IMPRESSION:  1.  New or enlarged hepatic lesions, most consistent with metastatic disease. 2.  Omental/peritoneal metastasis. 3.  Interval resection of the pancreatic body/tail.  New pancreatic ductal dilatation throughout the remaining head, likely secondary to pancreatic duct stones.  No evidence of acute pancreatitis. 4.  Development of mild common duct and borderline intrahepatic ductal dilatation.  No definite obstructive stone.  Given normal bilirubin on 10/13/11, imaging surveillance would likely be appropriate.  If more complete characterization is desired, MRCP may be informative. 5.  Nonobstructive renal calculi.  Original Report Authenticated By: Consuello Bossier, M.D.    ASSESSMENT: This is a very pleasant 77 years old white male with metastatic pancreatic adenocarcinoma currently on systemic chemotherapy with FOLFOX with mixed response on the last CT scan. I have a lengthy discussion with the patient and his daughter today about his previous scan results after comparison with the previous imaging  PLAN: #1 the patient will continue his treatment with FOLFOX for now. He will start cycle #8 today. #2 for pain management, the patient is doing fine with the current regimen with no need for breakthrough pain. He would try to take his OxyContin once a day as test. If he has no significant pain, I may consider decreasing his OxyContin dose to 10 mg by mouth every 12 hours. The patient will come back for followup visit in 2 weeks for evaluation and management any adverse effects.  He was advised to call me immediately if he has any concerning symptoms in the interval.  All questions were answered. The patient knows to call the clinic with any problems, questions or concerns. We can certainly see the patient much sooner if necessary.  I spent 20 minutes counseling the patient face to face. The total time spent in the appointment was 30 minutes.

## 2011-11-12 ENCOUNTER — Ambulatory Visit (HOSPITAL_BASED_OUTPATIENT_CLINIC_OR_DEPARTMENT_OTHER): Payer: Medicare Other

## 2011-11-12 ENCOUNTER — Other Ambulatory Visit: Payer: Self-pay | Admitting: *Deleted

## 2011-11-12 VITALS — BP 93/60 | HR 57 | Temp 97.7°F

## 2011-11-12 DIAGNOSIS — C259 Malignant neoplasm of pancreas, unspecified: Secondary | ICD-10-CM

## 2011-11-12 DIAGNOSIS — C252 Malignant neoplasm of tail of pancreas: Secondary | ICD-10-CM

## 2011-11-12 DIAGNOSIS — Z452 Encounter for adjustment and management of vascular access device: Secondary | ICD-10-CM

## 2011-11-12 DIAGNOSIS — C787 Secondary malignant neoplasm of liver and intrahepatic bile duct: Secondary | ICD-10-CM

## 2011-11-12 DIAGNOSIS — R52 Pain, unspecified: Secondary | ICD-10-CM

## 2011-11-12 MED ORDER — SODIUM CHLORIDE 0.9 % IJ SOLN
10.0000 mL | INTRAMUSCULAR | Status: DC | PRN
  Administered 2011-11-12: 10 mL
  Filled 2011-11-12: qty 10

## 2011-11-12 MED ORDER — OXYCODONE HCL 10 MG PO TB12: 10.0000 mg | ORAL_TABLET | Freq: Two times a day (BID) | ORAL | Status: AC

## 2011-11-12 MED ORDER — HEPARIN SOD (PORK) LOCK FLUSH 100 UNIT/ML IV SOLN
500.0000 [IU] | Freq: Once | INTRAVENOUS | Status: AC | PRN
  Administered 2011-11-12: 500 [IU]
  Filled 2011-11-12: qty 5

## 2011-11-17 ENCOUNTER — Telehealth: Payer: Self-pay | Admitting: Medical Oncology

## 2011-11-17 DIAGNOSIS — K121 Other forms of stomatitis: Secondary | ICD-10-CM

## 2011-11-17 MED ORDER — MAGIC MOUTHWASH: 5.0000 mL | Freq: Four times a day (QID) | ORAL | Status: DC

## 2011-11-17 NOTE — Telephone Encounter (Signed)
Daughter reports pt has mouth ulcers and bumpy  rash on Left anterior thigh from knee to thigh-no itching, pain , no fever. Per Dr Donnald Garre call in magic mouthwash . I called in rx to [facility] and to daughter. [facility] mouthwash called First  mouthwash with same ingredients including lidocaine.

## 2011-11-18 NOTE — Telephone Encounter (Signed)
11/18/11- i called Philip Shaw back to see if Philip Shaw started any new medicines and she said Glimiperide ( sulfa based) from 3/15-3/19. I told her Dr Donnald Garre thought the rash on his thigh looked like a drug rash. She said he had bactrim in the past and could not tolerated it due to stomach upset. I recommended to use steroid cream if it starts to bother him and to keep [place] posted of worsening rash. She voices understanding.

## 2011-11-24 ENCOUNTER — Ambulatory Visit: Payer: Medicare Other | Admitting: Internal Medicine

## 2011-11-24 ENCOUNTER — Ambulatory Visit (HOSPITAL_BASED_OUTPATIENT_CLINIC_OR_DEPARTMENT_OTHER): Payer: Medicare Other

## 2011-11-24 ENCOUNTER — Other Ambulatory Visit: Payer: Medicare Other | Admitting: Lab

## 2011-11-24 VITALS — BP 97/67 | HR 69 | Temp 97.1°F | Ht 67.0 in | Wt 144.2 lb

## 2011-11-24 DIAGNOSIS — C259 Malignant neoplasm of pancreas, unspecified: Secondary | ICD-10-CM

## 2011-11-24 DIAGNOSIS — Z5111 Encounter for antineoplastic chemotherapy: Secondary | ICD-10-CM

## 2011-11-24 LAB — CBC WITH DIFFERENTIAL/PLATELET
Basophils Absolute: 0 10*3/uL (ref 0.0–0.1)
Eosinophils Absolute: 0 10*3/uL (ref 0.0–0.5)
HGB: 10.8 g/dL — ABNORMAL LOW (ref 13.0–17.1)
MONO#: 1.1 10*3/uL — ABNORMAL HIGH (ref 0.1–0.9)
NEUT#: 1.2 10*3/uL — ABNORMAL LOW (ref 1.5–6.5)
RBC: 3.2 10*6/uL — ABNORMAL LOW (ref 4.20–5.82)
RDW: 17.8 % — ABNORMAL HIGH (ref 11.0–14.6)
WBC: 3.4 10*3/uL — ABNORMAL LOW (ref 4.0–10.3)
lymph#: 1.1 10*3/uL (ref 0.9–3.3)
nRBC: 1 % — ABNORMAL HIGH (ref 0–0)

## 2011-11-24 LAB — COMPREHENSIVE METABOLIC PANEL
ALT: 33 U/L (ref 0–53)
Albumin: 3 g/dL — ABNORMAL LOW (ref 3.5–5.2)
CO2: 26 mEq/L (ref 19–32)
Glucose, Bld: 113 mg/dL — ABNORMAL HIGH (ref 70–99)
Potassium: 4.7 mEq/L (ref 3.5–5.3)
Sodium: 131 mEq/L — ABNORMAL LOW (ref 135–145)
Total Protein: 6.6 g/dL (ref 6.0–8.3)

## 2011-11-24 MED ORDER — OXALIPLATIN CHEMO INJECTION 100 MG/20ML
85.0000 mg/m2 | Freq: Once | INTRAVENOUS | Status: AC
  Administered 2011-11-24: 160 mg via INTRAVENOUS
  Filled 2011-11-24: qty 32

## 2011-11-24 MED ORDER — SODIUM CHLORIDE 0.9 % IV SOLN
2400.0000 mg/m2 | INTRAVENOUS | Status: DC
  Administered 2011-11-24: 4450 mg via INTRAVENOUS
  Filled 2011-11-24: qty 89

## 2011-11-24 MED ORDER — LEUCOVORIN CALCIUM INJECTION 350 MG
400.0000 mg/m2 | Freq: Once | INTRAVENOUS | Status: AC
  Administered 2011-11-24: 744 mg via INTRAVENOUS
  Filled 2011-11-24: qty 37.2

## 2011-11-24 MED ORDER — FLUOROURACIL CHEMO INJECTION 2.5 GM/50ML
400.0000 mg/m2 | Freq: Once | INTRAVENOUS | Status: AC
  Administered 2011-11-24: 750 mg via INTRAVENOUS
  Filled 2011-11-24: qty 15

## 2011-11-24 MED ORDER — DEXTROSE 5 % IV SOLN
Freq: Once | INTRAVENOUS | Status: AC
  Administered 2011-11-24: 12:00:00 via INTRAVENOUS

## 2011-11-24 MED ORDER — ONDANSETRON 8 MG/50ML IVPB (CHCC)
8.0000 mg | Freq: Once | INTRAVENOUS | Status: AC
  Administered 2011-11-24: 8 mg via INTRAVENOUS

## 2011-11-24 MED ORDER — DEXAMETHASONE SODIUM PHOSPHATE 10 MG/ML IJ SOLN
10.0000 mg | Freq: Once | INTRAMUSCULAR | Status: AC
  Administered 2011-11-24: 10 mg via INTRAVENOUS

## 2011-11-24 NOTE — Patient Instructions (Signed)
[facility] Discharge Instructions for Patients Receiving Chemotherapy  Today you received the following chemotherapy agents: Oxaliplatin, Leucovorin, and 5FU.  To help prevent nausea and vomiting after your treatment, we encourage you to take your nausea medication as prescribed by your physician.   If you develop nausea and vomiting that is not controlled by your nausea medication, call the clinic.   BELOW ARE SYMPTOMS THAT SHOULD BE REPORTED IMMEDIATELY:  *FEVER GREATER THAN 100.5 F  *CHILLS WITH OR WITHOUT FEVER  NAUSEA AND VOMITING THAT IS NOT CONTROLLED WITH YOUR NAUSEA MEDICATION  *UNUSUAL SHORTNESS OF BREATH  *UNUSUAL BRUISING OR BLEEDING  TENDERNESS IN MOUTH AND THROAT WITH OR WITHOUT PRESENCE OF ULCERS  *URINARY PROBLEMS  *BOWEL PROBLEMS  UNUSUAL RASH Items with * indicate a potential emergency and should be followed up as soon as possible.  Feel free to call the clinic you have any questions or concerns. The clinic phone number is [phone].    

## 2011-11-24 NOTE — Progress Notes (Signed)
[facility] Telephone:(336) [phone]   Fax:([phone]  OFFICE PROGRESS NOTE  [name], MD, MD Dr. Burnell Blanks [address] [place] [address]  DIAGNOSIS: Metastatic pancreatic adenocarcinoma, now with metastatic disease to the liver and the abdominal wall, initially diagnosed with stage IIIB(T3, N1, M0) pancreatic tail adenocarcinoma in November of 2011.   PRIOR THERAPY:  #1 distal pancreatectomy and splenectomy on 06/25/2010  #2 adjuvant chemotherapy with gemcitabine started 08/20/2010 and completed 2 cycles on 12/02/2010.  #3 status post concurrent chemoradiation with Xeloda from 12/15/2010 on 10 01/24/2011.  #4 status post 2 more cycles of adjuvant gemcitabine with gemcitabine completed on 04/04/2011.   CURRENT THERAPY: FOLFOX giving every 2 weeks status post 8 cycles.    INTERVAL HISTORY: PINK MAYE 77 y.o. male returns to the clinic today for followup visit accompanied by his daughter. The patient is feeling fine with no specific complaints. He denied having any significant chest pain or shortness of breath. No significant weight loss or night sweats. He is abdomen pain is well controlled with current medication of OxyContin 10 mg only once a day in the morning. The patient denied having any significant nausea or vomiting. No change in his bowel movement.  MEDICAL HISTORY: Past Medical History  Diagnosis Date  . Cancer   . Heart murmur   . Abdominal pain, periumbilical   . Constipation     ALLERGIES:   has no known allergies.  MEDICATIONS:  Current Outpatient Prescriptions  Medication Sig Dispense Refill  . Alum & Mag Hydroxide-Simeth (MAGIC MOUTHWASH) SOLN Take 5 mLs by mouth QID.  240 mL  0  . aspirin 81 MG tablet Take 81 mg by mouth every morning.       Tery Sanfilippo Sodium (COLACE PO) Take 2 tablets by mouth 2 (two) times daily.        . insulin glargine (LANTUS) 100 UNIT/ML injection Inject into the skin at bedtime. 27  units, sliding scale      . lidocaine-prilocaine (EMLA) cream Apply 1 application topically as needed.  30 g  2  . lisinopril (PRINIVIL,ZESTRIL) 20 MG tablet Take 20 mg by mouth every morning.       . magnesium hydroxide (MILK OF MAGNESIA) 400 MG/5ML suspension Take by mouth daily.        . mirtazapine (REMERON) 30 MG tablet Take 1 tablet (30 mg total) by mouth at bedtime.  90 tablet  0  . Multiple Vitamins-Minerals (CENTRUM PO) Take 1 tablet by mouth daily.       Marland Kitchen oxyCODONE (OXY IR/ROXICODONE) 5 MG immediate release tablet Take 1 tablet (5 mg total) by mouth every 4 (four) hours as needed.  30 tablet  0  . oxyCODONE (OXYCONTIN) 10 MG 12 hr tablet Take 1 tablet (10 mg total) by mouth every 12 (twelve) hours.  60 tablet  0  . oxyCODONE (OXYCONTIN) 20 MG 12 hr tablet Take 1 tablet (20 mg total) by mouth every 12 (twelve) hours.  60 tablet  0  . pravastatin (PRAVACHOL) 20 MG tablet Take 20 mg by mouth at bedtime.       . ranitidine (ZANTAC) 150 MG tablet Take 150 mg by mouth 2 (two) times daily.       Marland Kitchen terazosin (HYTRIN) 2 MG capsule Take 2 mg by mouth at bedtime.       Marland Kitchen DISCONTD: metFORMIN (GLUCOPHAGE) 850 MG tablet Take 850 mg by mouth 2 (two) times daily with a  meal.        No current facility-administered medications for this visit.   Facility-Administered Medications Ordered in Other Visits  Medication Dose Route Frequency Provider Last Rate Last Dose  . fluorouracil (ADRUCIL) 4,450 mg in sodium chloride 0.9 % 150 mL chemo infusion  2,400 mg/m2 (Treatment Plan Actual) Intravenous 1 day or 1 dose Si Gaul, MD   4,450 mg at 08/18/11 1156    SURGICAL HISTORY:  Past Surgical History  Procedure Date  . Appendectomy 59-49 years old  . Inguinal hernia repair as infant, age 53    LIH- infant, RIH age 38  . Pancreatectomy 06/2010    with splenectomy    REVIEW OF SYSTEMS:  A comprehensive review of systems was negative except for: Constitutional: positive for fatigue Gastrointestinal:  positive for abdominal pain   PHYSICAL EXAMINATION: General appearance: alert, cooperative and no distress Neck: no adenopathy Lymph nodes: Cervical, supraclavicular, and axillary nodes normal. Resp: clear to auscultation bilaterally Cardio: regular rate and rhythm, S1, S2 normal, no murmur, click, rub or gallop GI: soft, non-tender; bowel sounds normal; no masses,  no organomegaly Extremities: extremities normal, atraumatic, no cyanosis or edema Neurologic: Alert and oriented X 3, normal strength and tone. Normal symmetric reflexes. Normal coordination and gait  ECOG PERFORMANCE STATUS: 1 - Symptomatic but completely ambulatory  Blood pressure 97/67, pulse 69, temperature 97.1 F (36.2 C), temperature source Oral, height 5\' 7"  (1.702 m), weight 144 lb 3.2 oz (65.409 kg).  LABORATORY DATA: Lab Results  Component Value Date   WBC 3.4* 11/24/2011   HGB 10.8* 11/24/2011   HCT 31.5* 11/24/2011   MCV 98.4* 11/24/2011   PLT 133* 11/24/2011      Chemistry      Component Value Date/Time   NA 122* 11/10/2011 1040   K 5.1 11/10/2011 1040   CL 86* 11/10/2011 1040   CO2 26 11/10/2011 1040   BUN 28* 11/10/2011 1040   CREATININE 0.84 11/10/2011 1040      Component Value Date/Time   CALCIUM 8.6 11/10/2011 1040   ALKPHOS 156* 11/10/2011 1040   AST 33 11/10/2011 1040   ALT 19 11/10/2011 1040   BILITOT 0.4 11/10/2011 1040       RADIOGRAPHIC STUDIES: No results found.  ASSESSMENT: This is a very pleasant 77 years old white male with metastatic pancreatic adenocarcinoma currently on systemic chemotherapy with FOLFOX status post 8 cycles. The patient is tolerating his treatment fairly well.   PLAN: He has few questions today regarding his prognosis and I answered them completely to his satisfaction. I recommended for him to continue on systemic chemotherapy with FOLFOX as scheduled. He'll receive cycle #9 today. The patient come back for followup visit in 2 weeks for reevaluation.  I will arrange for the  patient to have Neulasta injection on day 3 of this cycle because of neutropenia. For pain management he will continue on OxyContin 10 mg by mouth every morning. He was advised to call me immediately she has any concerning symptoms in the interval.  All questions were answered. The patient knows to call the clinic with any problems, questions or concerns. We can certainly see the patient much sooner if necessary.  I spent 20 minutes counseling the patient face to face. The total time spent in the appointment was 30 minutes.

## 2011-11-26 ENCOUNTER — Ambulatory Visit (HOSPITAL_BASED_OUTPATIENT_CLINIC_OR_DEPARTMENT_OTHER): Payer: Medicare Other

## 2011-11-26 ENCOUNTER — Ambulatory Visit: Payer: Medicare Other

## 2011-11-26 VITALS — BP 107/61 | HR 102 | Temp 96.5°F

## 2011-11-26 DIAGNOSIS — C259 Malignant neoplasm of pancreas, unspecified: Secondary | ICD-10-CM

## 2011-11-26 MED ORDER — PEGFILGRASTIM INJECTION 6 MG/0.6ML
6.0000 mg | Freq: Once | SUBCUTANEOUS | Status: AC
  Administered 2011-11-26: 6 mg via SUBCUTANEOUS
  Filled 2011-11-26: qty 0.6

## 2011-11-26 MED ORDER — SODIUM CHLORIDE 0.9 % IJ SOLN
10.0000 mL | INTRAMUSCULAR | Status: DC | PRN
  Administered 2011-11-26: 10 mL
  Filled 2011-11-26: qty 10

## 2011-11-26 MED ORDER — HEPARIN SOD (PORK) LOCK FLUSH 100 UNIT/ML IV SOLN
500.0000 [IU] | Freq: Once | INTRAVENOUS | Status: AC | PRN
  Administered 2011-11-26: 500 [IU]
  Filled 2011-11-26: qty 5

## 2011-12-01 ENCOUNTER — Telehealth: Payer: Self-pay | Admitting: *Deleted

## 2011-12-01 NOTE — Telephone Encounter (Signed)
Pt's daughter called stating that he needs weekly lab orders to be done a [facility] faxed to [phone] for CBC and CMET.  SLJ

## 2011-12-08 ENCOUNTER — Ambulatory Visit: Payer: Medicare Other | Admitting: Internal Medicine

## 2011-12-08 ENCOUNTER — Other Ambulatory Visit: Payer: Medicare Other | Admitting: Lab

## 2011-12-08 ENCOUNTER — Telehealth: Payer: Self-pay | Admitting: Medical Oncology

## 2011-12-08 NOTE — Telephone Encounter (Signed)
Called to clarify appointment. He thinks his appointment is today . I clarified with his wife that his appointment is tomorrow.

## 2011-12-09 ENCOUNTER — Ambulatory Visit: Payer: BLUE CROSS/BLUE SHIELD | Admitting: Nutrition

## 2011-12-09 ENCOUNTER — Ambulatory Visit: Payer: BLUE CROSS/BLUE SHIELD | Admitting: Internal Medicine

## 2011-12-09 ENCOUNTER — Ambulatory Visit (HOSPITAL_BASED_OUTPATIENT_CLINIC_OR_DEPARTMENT_OTHER): Payer: BLUE CROSS/BLUE SHIELD

## 2011-12-09 ENCOUNTER — Telehealth: Payer: Self-pay | Admitting: Internal Medicine

## 2011-12-09 ENCOUNTER — Other Ambulatory Visit (HOSPITAL_BASED_OUTPATIENT_CLINIC_OR_DEPARTMENT_OTHER): Payer: BLUE CROSS/BLUE SHIELD | Admitting: Lab

## 2011-12-09 ENCOUNTER — Inpatient Hospital Stay: Payer: Medicare Other

## 2011-12-09 VITALS — BP 133/75 | HR 87 | Temp 96.9°F | Ht 67.0 in | Wt 147.1 lb

## 2011-12-09 DIAGNOSIS — C259 Malignant neoplasm of pancreas, unspecified: Secondary | ICD-10-CM

## 2011-12-09 DIAGNOSIS — Z5111 Encounter for antineoplastic chemotherapy: Secondary | ICD-10-CM

## 2011-12-09 LAB — COMPREHENSIVE METABOLIC PANEL
BUN: 27 mg/dL — ABNORMAL HIGH (ref 6–23)
CO2: 25 mEq/L (ref 19–32)
Creatinine, Ser: 0.62 mg/dL (ref 0.50–1.35)
Glucose, Bld: 129 mg/dL — ABNORMAL HIGH (ref 70–99)
Total Bilirubin: 0.6 mg/dL (ref 0.3–1.2)
Total Protein: 6.3 g/dL (ref 6.0–8.3)

## 2011-12-09 LAB — CBC WITH DIFFERENTIAL/PLATELET
Eosinophils Absolute: 0.1 10*3/uL (ref 0.0–0.5)
HCT: 29.4 % — ABNORMAL LOW (ref 38.4–49.9)
LYMPH%: 22.2 % (ref 14.0–49.0)
MONO#: 3.8 10*3/uL — ABNORMAL HIGH (ref 0.1–0.9)
NEUT#: 7.5 10*3/uL — ABNORMAL HIGH (ref 1.5–6.5)
Platelets: 122 10*3/uL — ABNORMAL LOW (ref 140–400)
RBC: 2.93 10*6/uL — ABNORMAL LOW (ref 4.20–5.82)
WBC: 14.7 10*3/uL — ABNORMAL HIGH (ref 4.0–10.3)
nRBC: 1 % — ABNORMAL HIGH (ref 0–0)

## 2011-12-09 LAB — MAGNESIUM: Magnesium: 2.1 mg/dL (ref 1.5–2.5)

## 2011-12-09 MED ORDER — FLUOROURACIL CHEMO INJECTION 5 GM/100ML
2400.0000 mg/m2 | INTRAVENOUS | Status: DC
  Administered 2011-12-09: 4450 mg via INTRAVENOUS
  Filled 2011-12-09: qty 89

## 2011-12-09 MED ORDER — DEXAMETHASONE SODIUM PHOSPHATE 10 MG/ML IJ SOLN
10.0000 mg | Freq: Once | INTRAMUSCULAR | Status: AC
  Administered 2011-12-09: 10 mg via INTRAVENOUS

## 2011-12-09 MED ORDER — DEXTROSE 5 % IV SOLN
Freq: Once | INTRAVENOUS | Status: AC
  Administered 2011-12-09: 13:00:00 via INTRAVENOUS

## 2011-12-09 MED ORDER — LEUCOVORIN CALCIUM INJECTION 350 MG
400.0000 mg/m2 | Freq: Once | INTRAVENOUS | Status: AC
  Administered 2011-12-09: 744 mg via INTRAVENOUS
  Filled 2011-12-09: qty 37.2

## 2011-12-09 MED ORDER — ONDANSETRON 8 MG/50ML IVPB (CHCC)
8.0000 mg | Freq: Once | INTRAVENOUS | Status: AC
  Administered 2011-12-09: 8 mg via INTRAVENOUS

## 2011-12-09 MED ORDER — MIRTAZAPINE 30 MG PO TABS: 30.0000 mg | ORAL_TABLET | Freq: Every day | ORAL | Status: DC

## 2011-12-09 MED ORDER — DEXTROSE 5 % IV SOLN
85.0000 mg/m2 | Freq: Once | INTRAVENOUS | Status: AC
  Administered 2011-12-09: 160 mg via INTRAVENOUS
  Filled 2011-12-09: qty 32

## 2011-12-09 MED ORDER — FLUOROURACIL CHEMO INJECTION 2.5 GM/50ML
400.0000 mg/m2 | Freq: Once | INTRAVENOUS | Status: AC
  Administered 2011-12-09: 750 mg via INTRAVENOUS
  Filled 2011-12-09: qty 15

## 2011-12-09 NOTE — Progress Notes (Signed)
[facility] Telephone:(336) [phone]   Fax:([phone]  OFFICE PROGRESS NOTE  [name], MD, MD Dr. Burnell Blanks [address] [place] [address]  DIAGNOSIS: Metastatic pancreatic adenocarcinoma, now with metastatic disease to the liver and the abdominal wall, initially diagnosed with stage IIIB(T3, N1, M0) pancreatic tail adenocarcinoma in November of 2011.   PRIOR THERAPY:  #1 distal pancreatectomy and splenectomy on 06/25/2010  #2 adjuvant chemotherapy with gemcitabine started 08/20/2010 and completed 2 cycles on 12/02/2010.  #3 status post concurrent chemoradiation with Xeloda from 12/15/2010 on 10 01/24/2011.  #4 status post 2 more cycles of adjuvant gemcitabine with gemcitabine completed on 04/04/2011.   CURRENT THERAPY: FOLFOX giving every 2 weeks status post 9 cycles.   INTERVAL HISTORY: Philip Shaw 77 y.o. male returns to the clinic today for followup visit accompanied by his wife. The patient tolerated the last cycle of his chemotherapy fairly well with no significant complaints. He gained 3 more pounds since his last visit. The patient is very active these days and he was able to mow his yard. He denied having any significant nausea or vomiting, no abdominal pain. He has no chest pain or shortness of breath. He is here today to start cycle #10 of his chemotherapy.  MEDICAL HISTORY: Past Medical History  Diagnosis Date  . Cancer   . Heart murmur   . Abdominal pain, periumbilical   . Constipation     ALLERGIES:   has no known allergies.  MEDICATIONS:  Current Outpatient Prescriptions  Medication Sig Dispense Refill  . Alum & Mag Hydroxide-Simeth (MAGIC MOUTHWASH) SOLN Take 5 mLs by mouth QID.  240 mL  0  . aspirin 81 MG tablet Take 81 mg by mouth every morning.       Tery Sanfilippo Sodium (COLACE PO) Take 2 tablets by mouth 2 (two) times daily.        . insulin glargine (LANTUS) 100 UNIT/ML injection Inject into the skin at  bedtime. 37 units, sliding scale      . lidocaine-prilocaine (EMLA) cream Apply 1 application topically as needed.  30 g  2  . magnesium hydroxide (MILK OF MAGNESIA) 400 MG/5ML suspension Take by mouth daily.        . mirtazapine (REMERON) 30 MG tablet Take 1 tablet (30 mg total) by mouth at bedtime.  90 tablet  0  . Multiple Vitamins-Minerals (CENTRUM PO) Take 1 tablet by mouth daily.       Marland Kitchen oxyCODONE (OXY IR/ROXICODONE) 5 MG immediate release tablet Take 1 tablet (5 mg total) by mouth every 4 (four) hours as needed.  30 tablet  0  . oxyCODONE (OXYCONTIN) 20 MG 12 hr tablet Take 1 tablet (20 mg total) by mouth every 12 (twelve) hours.  60 tablet  0  . pravastatin (PRAVACHOL) 20 MG tablet Take 20 mg by mouth at bedtime.       . ranitidine (ZANTAC) 150 MG tablet Take 150 mg by mouth 2 (two) times daily.       Marland Kitchen terazosin (HYTRIN) 2 MG capsule Take 2 mg by mouth 2 (two) times daily.       Marland Kitchen DISCONTD: mirtazapine (REMERON) 30 MG tablet Take 1 tablet (30 mg total) by mouth at bedtime.  90 tablet  0  . DISCONTD: metFORMIN (GLUCOPHAGE) 850 MG tablet Take 850 mg by mouth 2 (two) times daily with a meal.        No current facility-administered medications for this  visit.   Facility-Administered Medications Ordered in Other Visits  Medication Dose Route Frequency Provider Last Rate Last Dose  . dexamethasone (DECADRON) injection 10 mg  10 mg Intravenous Once Si Gaul, MD   10 mg at 12/09/11 1305  . dextrose 5 % solution   Intravenous Once Si Gaul, MD      . fluorouracil (ADRUCIL) 4,450 mg in sodium chloride 0.9 % 150 mL chemo infusion  2,400 mg/m2 (Treatment Plan Actual) Intravenous 1 day or 1 dose Si Gaul, MD   4,450 mg at 08/18/11 1156  . fluorouracil (ADRUCIL) 4,450 mg in sodium chloride 0.9 % 150 mL chemo infusion  2,400 mg/m2 (Treatment Plan Actual) Intravenous 1 day or 1 dose Si Gaul, MD   4,450 mg at 12/09/11 1558  . fluorouracil (ADRUCIL) chemo injection 750 mg   400 mg/m2 (Treatment Plan Actual) Intravenous Once Si Gaul, MD   750 mg at 12/09/11 1552  . leucovorin 744 mg in dextrose 5 % 250 mL infusion  400 mg/m2 (Treatment Plan Actual) Intravenous Once Si Gaul, MD   744 mg at 12/09/11 1340  . ondansetron (ZOFRAN) IVPB 8 mg  8 mg Intravenous Once Si Gaul, MD   8 mg at 12/09/11 1305  . oxaliplatin (ELOXATIN) 160 mg in dextrose 5 % 500 mL chemo infusion  85 mg/m2 (Treatment Plan Actual) Intravenous Once Si Gaul, MD   160 mg at 12/09/11 1340    SURGICAL HISTORY:  Past Surgical History  Procedure Date  . Appendectomy 76-53 years old  . Inguinal hernia repair as infant, age 45    LIH- infant, RIH age 73  . Pancreatectomy 06/2010    with splenectomy    REVIEW OF SYSTEMS:  A comprehensive review of systems was negative.   PHYSICAL EXAMINATION: General appearance: alert, cooperative and no distress Head: Normocephalic, without obvious abnormality, atraumatic Neck: no adenopathy Lymph nodes: Cervical, supraclavicular, and axillary nodes normal. Resp: clear to auscultation bilaterally Cardio: regular rate and rhythm, S1, S2 normal, no murmur, click, rub or gallop GI: soft, non-tender; bowel sounds normal; no masses,  no organomegaly Extremities: extremities normal, atraumatic, no cyanosis or edema Neurologic: Alert and oriented X 3, normal strength and tone. Normal symmetric reflexes. Normal coordination and gait  ECOG PERFORMANCE STATUS: 1 - Symptomatic but completely ambulatory  Blood pressure 133/75, pulse 87, temperature 96.9 F (36.1 C), temperature source Oral, height 5\' 7"  (1.702 m), weight 147 lb 1.6 oz (66.724 kg).  LABORATORY DATA: Lab Results  Component Value Date   WBC 14.7* 12/09/2011   HGB 9.9* 12/09/2011   HCT 29.4* 12/09/2011   MCV 100.3* 12/09/2011   PLT 122* 12/09/2011      Chemistry      Component Value Date/Time   NA 131* 11/24/2011 1044   K 4.7 11/24/2011 1044   CL 97 11/24/2011 1044   CO2 26  11/24/2011 1044   BUN 28* 11/24/2011 1044   CREATININE 0.71 11/24/2011 1044      Component Value Date/Time   CALCIUM 9.0 11/24/2011 1044   ALKPHOS 171* 11/24/2011 1044   AST 49* 11/24/2011 1044   ALT 33 11/24/2011 1044   BILITOT 0.4 11/24/2011 1044       RADIOGRAPHIC STUDIES: No results found.  ASSESSMENT: This is a very pleasant 77 years old white male with metastatic pancreatic adenocarcinoma currently on systemic chemotherapy with FOLFOX status post 9 cycles. The patient is doing fine and tolerating his treatment fairly well.  PLAN: We will continue with his treatment today  as scheduled. The patient would come back for followup visit in 2 weeks for reevaluation before starting cycle #11. He was given a refill today for Compazine 10 mg by mouth every 6 hours as needed for nausea. The patient was advised to call me immediately if he has any concerning symptoms in the interval.  All questions were answered. The patient knows to call the clinic with any problems, questions or concerns. We can certainly see the patient much sooner if necessary.  I spent 20 minutes counseling the patient face to face. The total time spent in the appointment was 30 minutes.

## 2011-12-09 NOTE — Telephone Encounter (Signed)
Appts made and printed for pt  aom 

## 2011-12-09 NOTE — Progress Notes (Signed)
I saw Mr. Philip Shaw today in chemotherapy area.  He reports that he is doing much better.  He is continuing to tolerate Ensure Plus.  His weight has increased to 147.1 pounds documented today from 144.2 pounds documented on March 18th, which was our last visit.  He is requesting an additional case of Ensure Plus.  NUTRITION DIAGNOSIS:  Unintended weight loss has improved.  INTERVENTION:  I have reinforced the importance of increased Ensure Plus as tolerated.  I will provide patient with his final case of Ensure Plus today to take with him.  MONITORING/EVALUATION/GOALS:  The patient will continue to increase oral intake to control and minimize weight loss.  NEXT VISIT:  Monday, May 13th during chemotherapy.    ______________________________ Zenovia Jarred, RD, LDN Clinical Nutrition Specialist BN/MEDQ  D:  12/09/2011  T:  12/09/2011  Job:  [id number]

## 2011-12-11 ENCOUNTER — Ambulatory Visit (HOSPITAL_BASED_OUTPATIENT_CLINIC_OR_DEPARTMENT_OTHER): Payer: BLUE CROSS/BLUE SHIELD

## 2011-12-11 VITALS — BP 125/66 | HR 57 | Temp 97.6°F

## 2011-12-11 DIAGNOSIS — Z452 Encounter for adjustment and management of vascular access device: Secondary | ICD-10-CM

## 2011-12-11 DIAGNOSIS — C259 Malignant neoplasm of pancreas, unspecified: Secondary | ICD-10-CM

## 2011-12-11 MED ORDER — SODIUM CHLORIDE 0.9 % IJ SOLN
10.0000 mL | INTRAMUSCULAR | Status: DC | PRN
  Administered 2011-12-11: 10 mL
  Filled 2011-12-11: qty 10

## 2011-12-11 MED ORDER — HEPARIN SOD (PORK) LOCK FLUSH 100 UNIT/ML IV SOLN
500.0000 [IU] | Freq: Once | INTRAVENOUS | Status: AC | PRN
  Administered 2011-12-11: 500 [IU]
  Filled 2011-12-11: qty 5

## 2011-12-11 NOTE — Patient Instructions (Signed)
Call MD for problems 

## 2011-12-18 ENCOUNTER — Encounter: Payer: Self-pay | Admitting: *Deleted

## 2011-12-18 NOTE — Progress Notes (Signed)
labwork from [facility] given to Dr Donnald Garre to review.  SLJ

## 2011-12-22 ENCOUNTER — Telehealth: Payer: Self-pay | Admitting: Internal Medicine

## 2011-12-22 ENCOUNTER — Ambulatory Visit (HOSPITAL_BASED_OUTPATIENT_CLINIC_OR_DEPARTMENT_OTHER): Payer: Non-veteran care | Admitting: Physician Assistant

## 2011-12-22 ENCOUNTER — Encounter: Payer: Self-pay | Admitting: Physician Assistant

## 2011-12-22 ENCOUNTER — Other Ambulatory Visit: Payer: BLUE CROSS/BLUE SHIELD | Admitting: Lab

## 2011-12-22 ENCOUNTER — Inpatient Hospital Stay: Payer: BLUE CROSS/BLUE SHIELD

## 2011-12-22 VITALS — BP 111/71 | HR 85 | Temp 97.2°F | Ht 67.0 in | Wt 147.0 lb

## 2011-12-22 DIAGNOSIS — C259 Malignant neoplasm of pancreas, unspecified: Secondary | ICD-10-CM

## 2011-12-22 LAB — CBC WITH DIFFERENTIAL/PLATELET
BASO%: 0.7 % (ref 0.0–2.0)
EOS%: 0.7 % (ref 0.0–7.0)
Eosinophils Absolute: 0 10*3/uL (ref 0.0–0.5)
LYMPH%: 38.8 % (ref 14.0–49.0)
MCH: 34.9 pg — ABNORMAL HIGH (ref 27.2–33.4)
MCHC: 33.8 g/dL (ref 32.0–36.0)
MCV: 103.1 fL — ABNORMAL HIGH (ref 79.3–98.0)
MONO%: 28.9 % — ABNORMAL HIGH (ref 0.0–14.0)
NEUT#: 1.3 10*3/uL — ABNORMAL LOW (ref 1.5–6.5)
Platelets: 65 10*3/uL — ABNORMAL LOW (ref 140–400)
RBC: 2.61 10*6/uL — ABNORMAL LOW (ref 4.20–5.82)
RDW: 20.1 % — ABNORMAL HIGH (ref 11.0–14.6)
nRBC: 1 % — ABNORMAL HIGH (ref 0–0)

## 2011-12-22 NOTE — Telephone Encounter (Signed)
Gv pt appt for [id number] delayed tx for 04/26 to 05/07.  no slots  for 05/06 approved by [place]

## 2011-12-23 NOTE — Progress Notes (Signed)
[facility] Telephone:(336) [phone]   Fax:(336) [phone]  OFFICE PROGRESS NOTE  [name], MD, MD Dr. Burnell Blanks [address] [place] [address]  DIAGNOSIS: Metastatic pancreatic adenocarcinoma, now with metastatic disease to the liver and the abdominal wall, initially diagnosed with stage IIIB(T3, N1, M0) pancreatic tail adenocarcinoma in November of 2011.   PRIOR THERAPY:  #1 distal pancreatectomy and splenectomy on 06/25/2010  #2 adjuvant chemotherapy with gemcitabine started 08/20/2010 and completed 2 cycles on 12/02/2010.  #3 status post concurrent chemoradiation with Xeloda from 12/15/2010 on 10 01/24/2011.  #4 status post 2 more cycles of adjuvant gemcitabine with gemcitabine completed on 04/04/2011.   CURRENT THERAPY: FOLFOX giving every 2 weeks status post 10 cycles.   INTERVAL HISTORY: LIGE LAKEMAN 77 y.o. male returns to the clinic today for followup visit accompanied by by a friend. He reports that he fell weeks ago Saturday. Stating that he fell backwards in his head and right shoulder but did not lose consciousness. In fact he states that he just got upper milligrams and had no adverse sequelae. Continues to have right knee pain due to a long-standing piece of loose cartilage per the patient. He also reports some nausea she tries to eat but is able to drink 8 or 9 protein shakes a day. The patient tolerated the last cycle of his chemotherapy fairly well with no significant complaints. He denied having any significant nausea or vomiting, no abdominal pain. He has no chest pain or shortness of breath. He is here today to start cycle #11 of his chemotherapy.  MEDICAL HISTORY: Past Medical History  Diagnosis Date  . Cancer   . Heart murmur   . Abdominal pain, periumbilical   . Constipation     ALLERGIES:   has no known allergies.  MEDICATIONS:  Current Outpatient Prescriptions  Medication Sig Dispense Refill  . Alum & Mag  Hydroxide-Simeth (MAGIC MOUTHWASH) SOLN Take 5 mLs by mouth QID.  240 mL  0  . aspirin 81 MG tablet Take 81 mg by mouth every morning.       Tery Sanfilippo Sodium (COLACE PO) Take 2 tablets by mouth 2 (two) times daily.        . insulin glargine (LANTUS) 100 UNIT/ML injection Inject into the skin at bedtime. 37 units, sliding scale      . lidocaine-prilocaine (EMLA) cream Apply 1 application topically as needed.  30 g  2  . magnesium hydroxide (MILK OF MAGNESIA) 400 MG/5ML suspension Take by mouth daily.        . mirtazapine (REMERON) 30 MG tablet Take 1 tablet (30 mg total) by mouth at bedtime.  90 tablet  0  . Multiple Vitamins-Minerals (CENTRUM PO) Take 1 tablet by mouth daily.       . ondansetron (ZOFRAN-ODT) 8 MG disintegrating tablet       . oxyCODONE (OXY IR/ROXICODONE) 5 MG immediate release tablet Take 1 tablet (5 mg total) by mouth every 4 (four) hours as needed.  30 tablet  0  . oxyCODONE (OXYCONTIN) 20 MG 12 hr tablet Take 1 tablet (20 mg total) by mouth every 12 (twelve) hours.  60 tablet  0  . pravastatin (PRAVACHOL) 20 MG tablet Take 20 mg by mouth at bedtime.       . ranitidine (ZANTAC) 150 MG tablet Take 150 mg by mouth 2 (two) times daily.       Marland Kitchen terazosin (HYTRIN) 2 MG capsule Take 2 mg  by mouth 2 (two) times daily.       Marland Kitchen glimepiride (AMARYL) 4 MG tablet       . DISCONTD: metFORMIN (GLUCOPHAGE) 850 MG tablet Take 850 mg by mouth 2 (two) times daily with a meal.        No current facility-administered medications for this visit.   Facility-Administered Medications Ordered in Other Visits  Medication Dose Route Frequency Provider Last Rate Last Dose  . fluorouracil (ADRUCIL) 4,450 mg in sodium chloride 0.9 % 150 mL chemo infusion  2,400 mg/m2 (Treatment Plan Actual) Intravenous 1 day or 1 dose Si Gaul, MD   4,450 mg at 08/18/11 1156    SURGICAL HISTORY:  Past Surgical History  Procedure Date  . Appendectomy 24-53 years old  . Inguinal hernia repair as infant, age 59      LIH- infant, RIH age 62  . Pancreatectomy 06/2010    with splenectomy    REVIEW OF SYSTEMS:  A comprehensive review of systems was negative.   PHYSICAL EXAMINATION: General appearance: alert, cooperative and no distress Head: Normocephalic, without obvious abnormality, atraumatic Neck: no adenopathy Lymph nodes: Cervical, supraclavicular, and axillary nodes normal. Resp: clear to auscultation bilaterally Cardio: regular rate and rhythm, S1, S2 normal, no murmur, click, rub or gallop GI: soft, non-tender; bowel sounds normal; no masses,  no organomegaly Extremities: extremities normal, atraumatic, no cyanosis or edema Neurologic: Alert and oriented X 3, normal strength and tone. Normal symmetric reflexes. Normal coordination and gait  ECOG PERFORMANCE STATUS: 1 - Symptomatic but completely ambulatory  Blood pressure 111/71, pulse 85, temperature 97.2 F (36.2 C), temperature source Oral, height 5\' 7"  (1.702 m), weight 147 lb (66.679 kg).  LABORATORY DATA: Lab Results  Component Value Date   WBC 4.1 12/22/2011   HGB 9.1* 12/22/2011   HCT 26.9* 12/22/2011   MCV 103.1* 12/22/2011   PLT 65* 12/22/2011      Chemistry      Component Value Date/Time   NA 134* 12/09/2011 1103   K 4.3 12/09/2011 1103   CL 100 12/09/2011 1103   CO2 25 12/09/2011 1103   BUN 27* 12/09/2011 1103   CREATININE 0.62 12/09/2011 1103      Component Value Date/Time   CALCIUM 8.5 12/09/2011 1103   ALKPHOS 218* 12/09/2011 1103   AST 58* 12/09/2011 1103   ALT 36 12/09/2011 1103   BILITOT 0.6 12/09/2011 1103       RADIOGRAPHIC STUDIES: No results found.  ASSESSMENT/PLAN: This is a very pleasant 77 years old white male with metastatic pancreatic adenocarcinoma currently on systemic chemotherapy with FOLFOX status post 10 cycles. The patient is doing fine and tolerating his treatment fairly well. The patient was discussed with Dr. Gwenyth Bouillon. He is from the cytopenic stable platelet count of 65. Is not having any  active bleeding or bruising. We will postpone his chemotherapy 1 week and recheck another CBC differential and C. met when he returns. Should his counts be acceptable he will proceed with cycle #11 of his systemic chemotherapy with FOLFOX that is given every 2 weeks. He'll followup with Dr. Gwenyth Bouillon in 3 weeks with repeat CBC differential and C. met prior to cycle #12.  Laural Benes, Falon Flinchum E, PA-C  All questions were answered. The patient knows to call the clinic with any problems, questions or concerns. We can certainly see the patient much sooner if necessary.

## 2011-12-30 ENCOUNTER — Ambulatory Visit (HOSPITAL_BASED_OUTPATIENT_CLINIC_OR_DEPARTMENT_OTHER): Payer: BLUE CROSS/BLUE SHIELD

## 2011-12-30 ENCOUNTER — Other Ambulatory Visit (HOSPITAL_BASED_OUTPATIENT_CLINIC_OR_DEPARTMENT_OTHER): Payer: BLUE CROSS/BLUE SHIELD | Admitting: Lab

## 2011-12-30 ENCOUNTER — Encounter: Payer: Self-pay | Admitting: Internal Medicine

## 2011-12-30 ENCOUNTER — Telehealth: Payer: Self-pay | Admitting: Medical Oncology

## 2011-12-30 ENCOUNTER — Other Ambulatory Visit: Payer: Self-pay | Admitting: Internal Medicine

## 2011-12-30 VITALS — BP 120/73 | HR 96 | Temp 98.0°F

## 2011-12-30 DIAGNOSIS — Z5111 Encounter for antineoplastic chemotherapy: Secondary | ICD-10-CM

## 2011-12-30 DIAGNOSIS — C259 Malignant neoplasm of pancreas, unspecified: Secondary | ICD-10-CM

## 2011-12-30 DIAGNOSIS — C787 Secondary malignant neoplasm of liver and intrahepatic bile duct: Secondary | ICD-10-CM

## 2011-12-30 DIAGNOSIS — C252 Malignant neoplasm of tail of pancreas: Secondary | ICD-10-CM

## 2011-12-30 LAB — COMPREHENSIVE METABOLIC PANEL
AST: 66 U/L — ABNORMAL HIGH (ref 0–37)
Albumin: 3 g/dL — ABNORMAL LOW (ref 3.5–5.2)
Alkaline Phosphatase: 291 U/L — ABNORMAL HIGH (ref 39–117)
Glucose, Bld: 130 mg/dL — ABNORMAL HIGH (ref 70–99)
Potassium: 4.5 mEq/L (ref 3.5–5.3)
Sodium: 133 mEq/L — ABNORMAL LOW (ref 135–145)
Total Protein: 5.9 g/dL — ABNORMAL LOW (ref 6.0–8.3)

## 2011-12-30 LAB — CBC WITH DIFFERENTIAL/PLATELET
BASO%: 0.2 % (ref 0.0–2.0)
Basophils Absolute: 0 10*3/uL (ref 0.0–0.1)
EOS%: 2.6 % (ref 0.0–7.0)
HGB: 10.2 g/dL — ABNORMAL LOW (ref 13.0–17.1)
MCH: 35.1 pg — ABNORMAL HIGH (ref 27.2–33.4)
MONO#: 2.1 10*3/uL — ABNORMAL HIGH (ref 0.1–0.9)
RDW: 19.6 % — ABNORMAL HIGH (ref 11.0–14.6)
WBC: 6.1 10*3/uL (ref 4.0–10.3)
lymph#: 1.5 10*3/uL (ref 0.9–3.3)

## 2011-12-30 MED ORDER — ONDANSETRON 8 MG/50ML IVPB (CHCC)
8.0000 mg | Freq: Once | INTRAVENOUS | Status: AC
  Administered 2011-12-30: 8 mg via INTRAVENOUS

## 2011-12-30 MED ORDER — OXALIPLATIN CHEMO INJECTION 100 MG/20ML
65.0000 mg/m2 | Freq: Once | INTRAVENOUS | Status: AC
  Administered 2011-12-30: 120 mg via INTRAVENOUS
  Filled 2011-12-30: qty 24

## 2011-12-30 MED ORDER — SODIUM CHLORIDE 0.9 % IV SOLN
2400.0000 mg/m2 | INTRAVENOUS | Status: DC
  Administered 2011-12-30: 4450 mg via INTRAVENOUS
  Filled 2011-12-30: qty 89

## 2011-12-30 MED ORDER — FLUOROURACIL CHEMO INJECTION 2.5 GM/50ML
400.0000 mg/m2 | Freq: Once | INTRAVENOUS | Status: AC
  Administered 2011-12-30: 750 mg via INTRAVENOUS
  Filled 2011-12-30: qty 15

## 2011-12-30 MED ORDER — DEXTROSE 5 % IV SOLN
Freq: Once | INTRAVENOUS | Status: AC
  Administered 2011-12-30: 12:00:00 via INTRAVENOUS

## 2011-12-30 MED ORDER — DEXAMETHASONE SODIUM PHOSPHATE 10 MG/ML IJ SOLN
10.0000 mg | Freq: Once | INTRAMUSCULAR | Status: AC
  Administered 2011-12-30: 10 mg via INTRAVENOUS

## 2011-12-30 MED ORDER — LEUCOVORIN CALCIUM INJECTION 350 MG
400.0000 mg/m2 | Freq: Once | INTRAVENOUS | Status: AC
  Administered 2011-12-30: 744 mg via INTRAVENOUS
  Filled 2011-12-30: qty 37.2

## 2011-12-30 NOTE — Progress Notes (Signed)
Patient came in today wanting itemized statements I called the billing office for him they will be mailing out to patient today

## 2011-12-30 NOTE — Patient Instructions (Signed)
[facility] Discharge Instructions for Patients Receiving Chemotherapy  Today you received the following chemotherapy agents Oxaliplatin, Leucovorin and 5-FU.  To help prevent nausea and vomiting after your treatment, we encourage you to take your nausea medication as ordered per MD.    If you develop nausea and vomiting that is not controlled by your nausea medication, call the clinic. If it is after clinic hours your family physician or the after hours number for the clinic or go to the Emergency Department.   BELOW ARE SYMPTOMS THAT SHOULD BE REPORTED IMMEDIATELY:  *FEVER GREATER THAN 100.5 F  *CHILLS WITH OR WITHOUT FEVER  NAUSEA AND VOMITING THAT IS NOT CONTROLLED WITH YOUR NAUSEA MEDICATION  *UNUSUAL SHORTNESS OF BREATH  *UNUSUAL BRUISING OR BLEEDING  TENDERNESS IN MOUTH AND THROAT WITH OR WITHOUT PRESENCE OF ULCERS  *URINARY PROBLEMS  *BOWEL PROBLEMS  UNUSUAL RASH Items with * indicate a potential emergency and should be followed up as soon as possible.   Please let the nurse know about any problems that you may have experienced. Feel free to call the clinic you have any questions or concerns. The clinic phone number is [phone].   I have been informed and understand all the instructions given to me. I know to contact the clinic, my physician, or go to the Emergency Department if any problems should occur. I do not have any questions at this time, but understand that I may call the clinic during office hours   should I have any questions or need assistance in obtaining follow up care.    __________________________________________  _____________  __________ Signature of Patient or Authorized Representative            Date                   Time    __________________________________________ Nurse's Signature    

## 2011-12-30 NOTE — Telephone Encounter (Signed)
Chemo this week so next weeks appts cancelled.

## 2011-12-31 ENCOUNTER — Telehealth: Payer: Self-pay | Admitting: Internal Medicine

## 2011-12-31 NOTE — Telephone Encounter (Signed)
Per pof from 5/7 diane,cx 5/13 and 5/15,done

## 2012-01-01 ENCOUNTER — Ambulatory Visit (HOSPITAL_BASED_OUTPATIENT_CLINIC_OR_DEPARTMENT_OTHER): Payer: BLUE CROSS/BLUE SHIELD

## 2012-01-01 VITALS — BP 129/74 | HR 103 | Temp 98.4°F

## 2012-01-01 DIAGNOSIS — Z452 Encounter for adjustment and management of vascular access device: Secondary | ICD-10-CM

## 2012-01-01 DIAGNOSIS — C259 Malignant neoplasm of pancreas, unspecified: Secondary | ICD-10-CM

## 2012-01-01 MED ORDER — SODIUM CHLORIDE 0.9 % IJ SOLN
10.0000 mL | INTRAMUSCULAR | Status: DC | PRN
  Administered 2012-01-01: 10 mL
  Filled 2012-01-01: qty 10

## 2012-01-01 MED ORDER — HEPARIN SOD (PORK) LOCK FLUSH 100 UNIT/ML IV SOLN
500.0000 [IU] | Freq: Once | INTRAVENOUS | Status: AC | PRN
  Administered 2012-01-01: 500 [IU]
  Filled 2012-01-01: qty 5

## 2012-01-01 NOTE — Patient Instructions (Signed)
Call MD for problems 

## 2012-01-05 ENCOUNTER — Other Ambulatory Visit: Payer: Medicare Other | Admitting: Lab

## 2012-01-05 ENCOUNTER — Inpatient Hospital Stay: Payer: Medicare Other

## 2012-01-05 ENCOUNTER — Other Ambulatory Visit: Payer: Self-pay | Admitting: *Deleted

## 2012-01-05 ENCOUNTER — Encounter: Payer: BLUE CROSS/BLUE SHIELD | Admitting: Nutrition

## 2012-01-05 DIAGNOSIS — C3490 Malignant neoplasm of unspecified part of unspecified bronchus or lung: Secondary | ICD-10-CM

## 2012-01-05 MED ORDER — ONDANSETRON HCL 8 MG PO TABS: 8.0000 mg | ORAL_TABLET | Freq: Three times a day (TID) | ORAL | Status: AC | PRN

## 2012-01-07 ENCOUNTER — Encounter: Payer: Self-pay | Admitting: *Deleted

## 2012-01-07 NOTE — Progress Notes (Signed)
Labwork from [facility] scanned into EPIC.  SLJ

## 2012-01-12 ENCOUNTER — Other Ambulatory Visit (HOSPITAL_BASED_OUTPATIENT_CLINIC_OR_DEPARTMENT_OTHER): Payer: BLUE CROSS/BLUE SHIELD

## 2012-01-12 ENCOUNTER — Ambulatory Visit (HOSPITAL_BASED_OUTPATIENT_CLINIC_OR_DEPARTMENT_OTHER): Payer: Non-veteran care

## 2012-01-12 ENCOUNTER — Other Ambulatory Visit: Payer: Self-pay | Admitting: Medical Oncology

## 2012-01-12 ENCOUNTER — Ambulatory Visit (HOSPITAL_BASED_OUTPATIENT_CLINIC_OR_DEPARTMENT_OTHER): Payer: BLUE CROSS/BLUE SHIELD | Admitting: Internal Medicine

## 2012-01-12 VITALS — BP 115/68 | HR 81 | Temp 98.2°F | Ht 67.0 in | Wt 149.9 lb

## 2012-01-12 DIAGNOSIS — C252 Malignant neoplasm of tail of pancreas: Secondary | ICD-10-CM

## 2012-01-12 DIAGNOSIS — G609 Hereditary and idiopathic neuropathy, unspecified: Secondary | ICD-10-CM

## 2012-01-12 DIAGNOSIS — C259 Malignant neoplasm of pancreas, unspecified: Secondary | ICD-10-CM

## 2012-01-12 DIAGNOSIS — Z5111 Encounter for antineoplastic chemotherapy: Secondary | ICD-10-CM

## 2012-01-12 DIAGNOSIS — C779 Secondary and unspecified malignant neoplasm of lymph node, unspecified: Secondary | ICD-10-CM

## 2012-01-12 DIAGNOSIS — C787 Secondary malignant neoplasm of liver and intrahepatic bile duct: Secondary | ICD-10-CM

## 2012-01-12 DIAGNOSIS — K121 Other forms of stomatitis: Secondary | ICD-10-CM

## 2012-01-12 DIAGNOSIS — C50919 Malignant neoplasm of unspecified site of unspecified female breast: Secondary | ICD-10-CM

## 2012-01-12 DIAGNOSIS — R52 Pain, unspecified: Secondary | ICD-10-CM

## 2012-01-12 LAB — CBC WITH DIFFERENTIAL/PLATELET
Basophils Absolute: 0 10*3/uL (ref 0.0–0.1)
Eosinophils Absolute: 0.2 10*3/uL (ref 0.0–0.5)
LYMPH%: 25.6 % (ref 14.0–49.0)
MCV: 102.3 fL — ABNORMAL HIGH (ref 79.3–98.0)
MONO%: 26.4 % — ABNORMAL HIGH (ref 0.0–14.0)
NEUT#: 2.2 10*3/uL (ref 1.5–6.5)
NEUT%: 43.4 % (ref 39.0–75.0)
Platelets: 161 10*3/uL (ref 140–400)
RBC: 2.65 10*6/uL — ABNORMAL LOW (ref 4.20–5.82)
nRBC: 1 % — ABNORMAL HIGH (ref 0–0)

## 2012-01-12 MED ORDER — OXYCODONE HCL 10 MG PO TB12: 10.0000 mg | ORAL_TABLET | Freq: Two times a day (BID) | ORAL | Status: DC

## 2012-01-12 MED ORDER — ONDANSETRON 8 MG/50ML IVPB (CHCC)
8.0000 mg | Freq: Once | INTRAVENOUS | Status: AC
  Administered 2012-01-12: 8 mg via INTRAVENOUS

## 2012-01-12 MED ORDER — DEXAMETHASONE SODIUM PHOSPHATE 10 MG/ML IJ SOLN
10.0000 mg | Freq: Once | INTRAMUSCULAR | Status: AC
  Administered 2012-01-12: 10 mg via INTRAVENOUS

## 2012-01-12 MED ORDER — MAGIC MOUTHWASH: 5.0000 mL | Freq: Four times a day (QID) | ORAL | Status: DC

## 2012-01-12 MED ORDER — OXALIPLATIN CHEMO INJECTION 100 MG/20ML
65.0000 mg/m2 | Freq: Once | INTRAVENOUS | Status: AC
  Administered 2012-01-12: 120 mg via INTRAVENOUS
  Filled 2012-01-12: qty 24

## 2012-01-12 MED ORDER — LEUCOVORIN CALCIUM INJECTION 350 MG
400.0000 mg/m2 | Freq: Once | INTRAVENOUS | Status: AC
  Administered 2012-01-12: 744 mg via INTRAVENOUS
  Filled 2012-01-12: qty 37.2

## 2012-01-12 MED ORDER — SODIUM CHLORIDE 0.9 % IV SOLN
2400.0000 mg/m2 | INTRAVENOUS | Status: DC
  Administered 2012-01-12: 4450 mg via INTRAVENOUS
  Filled 2012-01-12: qty 89

## 2012-01-12 MED ORDER — FLUOROURACIL CHEMO INJECTION 2.5 GM/50ML
400.0000 mg/m2 | Freq: Once | INTRAVENOUS | Status: AC
  Administered 2012-01-12: 750 mg via INTRAVENOUS
  Filled 2012-01-12: qty 15

## 2012-01-12 NOTE — Telephone Encounter (Signed)
Lab appt for 5/28 cancelled per pt's wife, she informed me that lab was being drawn @ primary md and fax here. Gave pt appt calendar for May and June 2013 with lab, Ct and MD.

## 2012-01-12 NOTE — Progress Notes (Signed)
[facility] Telephone:([phone]   Fax:(336) [phone]  OFFICE PROGRESS NOTE  Philip Ravel, MD, MD Dr. Burnell Blanks [address] [place] [address]  DIAGNOSIS: Metastatic pancreatic adenocarcinoma, now with metastatic disease to the liver and the abdominal wall, initially diagnosed with stage IIIB(T3, N1, M0) pancreatic tail adenocarcinoma in November of 2011.   PRIOR THERAPY:  #1 distal pancreatectomy and splenectomy on 06/25/2010  #2 adjuvant chemotherapy with gemcitabine started 08/20/2010 and completed 2 cycles on 12/02/2010.  #3 status post concurrent chemoradiation with Xeloda from 12/15/2010 on 10 01/24/2011.  #4 status post 2 more cycles of adjuvant gemcitabine with gemcitabine completed on 04/04/2011.   CURRENT THERAPY: FOLFOX giving every 2 weeks status post 11 cycles.    INTERVAL HISTORY: Philip Shaw 77 y.o. male returns to the clinic today for followup visit accompanied by his wife. The patient is feeling fine today with no specific complaints except for mild peripheral neuropathy affecting his fingers and toes. He denied having any significant weight loss or night sweats. He has no nausea or vomiting and no abdominal pain. No significant chest pain or shortness of breath. He tolerated the last cycle of his systemic chemotherapy fairly well.  MEDICAL HISTORY: Past Medical History  Diagnosis Date  . Cancer   . Heart murmur   . Abdominal pain, periumbilical   . Constipation     ALLERGIES:   has no known allergies.  MEDICATIONS:  Current Outpatient Prescriptions  Medication Sig Dispense Refill  . Alum & Mag Hydroxide-Simeth (MAGIC MOUTHWASH) SOLN Take 5 mLs by mouth QID.  240 mL  0  . aspirin 81 MG tablet Take 81 mg by mouth every morning.       Tery Sanfilippo Sodium (COLACE PO) Take 2 tablets by mouth 2 (two) times daily.        Marland Kitchen glimepiride (AMARYL) 4 MG tablet       . insulin glargine (LANTUS) 100 UNIT/ML injection Inject  into the skin at bedtime. 37 units, sliding scale      . lidocaine-prilocaine (EMLA) cream Apply 1 application topically as needed.  30 g  2  . magnesium hydroxide (MILK OF MAGNESIA) 400 MG/5ML suspension Take by mouth daily.        . mirtazapine (REMERON) 30 MG tablet Take 1 tablet (30 mg total) by mouth at bedtime.  90 tablet  0  . Multiple Vitamins-Minerals (CENTRUM PO) Take 1 tablet by mouth daily.       . ondansetron (ZOFRAN) 8 MG tablet Take 1 tablet (8 mg total) by mouth every 8 (eight) hours as needed for nausea.  60 tablet  1  . ondansetron (ZOFRAN-ODT) 8 MG disintegrating tablet       . oxyCODONE (OXY IR/ROXICODONE) 5 MG immediate release tablet Take 1 tablet (5 mg total) by mouth every 4 (four) hours as needed.  30 tablet  0  . pravastatin (PRAVACHOL) 20 MG tablet Take 20 mg by mouth at bedtime.       . ranitidine (ZANTAC) 150 MG tablet Take 150 mg by mouth 2 (two) times daily.       Marland Kitchen terazosin (HYTRIN) 2 MG capsule Take 2 mg by mouth 2 (two) times daily.       Marland Kitchen DISCONTD: metFORMIN (GLUCOPHAGE) 850 MG tablet Take 850 mg by mouth 2 (two) times daily with a meal.        No current facility-administered medications for this visit.   Facility-Administered Medications Ordered  in Other Visits  Medication Dose Route Frequency Provider Last Rate Last Dose  . fluorouracil (ADRUCIL) 4,450 mg in sodium chloride 0.9 % 150 mL chemo infusion  2,400 mg/m2 (Treatment Plan Actual) Intravenous 1 day or 1 dose Si Gaul, MD   4,450 mg at 08/18/11 1156    SURGICAL HISTORY:  Past Surgical History  Procedure Date  . Appendectomy 38-25 years old  . Inguinal hernia repair as infant, age 68    LIH- infant, RIH age 47  . Pancreatectomy 06/2010    with splenectomy    REVIEW OF SYSTEMS:  A comprehensive review of systems was negative except for: Neurological: positive for paresthesia   PHYSICAL EXAMINATION: General appearance: alert, cooperative and no distress Head: Normocephalic, without  obvious abnormality, atraumatic Neck: no adenopathy Lymph nodes: Cervical, supraclavicular, and axillary nodes normal. Resp: clear to auscultation bilaterally Cardio: regular rate and rhythm, S1, S2 normal, no murmur, click, rub or gallop GI: soft, non-tender; bowel sounds normal; no masses,  no organomegaly Extremities: extremities normal, atraumatic, no cyanosis or edema Neurologic: Alert and oriented X 3, normal strength and tone. Normal symmetric reflexes. Normal coordination and gait  ECOG PERFORMANCE STATUS: 1 - Symptomatic but completely ambulatory  Blood pressure 115/68, pulse 81, temperature 98.2 F (36.8 C), temperature source Oral, height 5\' 7"  (1.702 m), weight 149 lb 14.4 oz (67.994 kg).  LABORATORY DATA: Lab Results  Component Value Date   WBC 5.0 01/12/2012   HGB 9.4* 01/12/2012   HCT 27.1* 01/12/2012   MCV 102.3* 01/12/2012   PLT 161 01/12/2012      Chemistry      Component Value Date/Time   NA 133* 12/30/2011 1032   K 4.5 12/30/2011 1032   CL 102 12/30/2011 1032   CO2 23 12/30/2011 1032   BUN 23 12/30/2011 1032   CREATININE 0.65 12/30/2011 1032      Component Value Date/Time   CALCIUM 8.6 12/30/2011 1032   ALKPHOS 291* 12/30/2011 1032   AST 66* 12/30/2011 1032   ALT 44 12/30/2011 1032   BILITOT 0.7 12/30/2011 1032       RADIOGRAPHIC STUDIES: No results found.  ASSESSMENT: This is a very pleasant 77 years old white male with history of metastatic pancreatic adenocarcinoma currently on systemic chemotherapy with FOLFOX status post 11 cycles. The patient is doing fine and tolerating his treatment fairly well.  PLAN: We will proceed with cycle #12 today as scheduled. The patient would come back for followup visit in 2 weeks with repeat CT scan of the chest abdomen and pelvis for restaging of his disease. He was given a refill of his pain medication in the form of OxyContin 10 mg by mouth every 12 hours. The patient was advised to call me immediately if he has any concerning  symptoms in the interval.  All questions were answered. The patient knows to call the clinic with any problems, questions or concerns. We can certainly see the patient much sooner if necessary.  I spent 15 minutes counseling the patient face to face. The total time spent in the appointment was 25 minutes.

## 2012-01-12 NOTE — Progress Notes (Signed)
Prescription for oxycontin given to pt

## 2012-01-12 NOTE — Telephone Encounter (Signed)
Magic mouthwash rx faxed to [facility] [place]

## 2012-01-14 ENCOUNTER — Ambulatory Visit (HOSPITAL_BASED_OUTPATIENT_CLINIC_OR_DEPARTMENT_OTHER): Payer: Non-veteran care

## 2012-01-14 VITALS — BP 113/63 | HR 89 | Temp 98.3°F

## 2012-01-14 DIAGNOSIS — C50919 Malignant neoplasm of unspecified site of unspecified female breast: Secondary | ICD-10-CM

## 2012-01-14 DIAGNOSIS — C259 Malignant neoplasm of pancreas, unspecified: Secondary | ICD-10-CM

## 2012-01-14 DIAGNOSIS — C252 Malignant neoplasm of tail of pancreas: Secondary | ICD-10-CM

## 2012-01-14 DIAGNOSIS — C787 Secondary malignant neoplasm of liver and intrahepatic bile duct: Secondary | ICD-10-CM

## 2012-01-14 MED ORDER — HEPARIN SOD (PORK) LOCK FLUSH 100 UNIT/ML IV SOLN
500.0000 [IU] | Freq: Once | INTRAVENOUS | Status: AC | PRN
  Administered 2012-01-14: 500 [IU]
  Filled 2012-01-14: qty 5

## 2012-01-14 MED ORDER — SODIUM CHLORIDE 0.9 % IJ SOLN
10.0000 mL | INTRAMUSCULAR | Status: DC | PRN
  Administered 2012-01-14: 10 mL
  Filled 2012-01-14: qty 10

## 2012-01-14 NOTE — Patient Instructions (Signed)
Call MD for problems 

## 2012-01-20 ENCOUNTER — Other Ambulatory Visit: Payer: Medicare Other

## 2012-01-20 ENCOUNTER — Inpatient Hospital Stay: Payer: Medicare Other

## 2012-01-22 ENCOUNTER — Ambulatory Visit (HOSPITAL_COMMUNITY)
Admission: RE | Admit: 2012-01-22 | Discharge: 2012-01-22 | Disposition: A | Discharge status: Home / Self Care | Payer: Medicare Other | Source: Ambulatory Visit | Attending: Internal Medicine | Admitting: Internal Medicine

## 2012-01-22 ENCOUNTER — Encounter (HOSPITAL_COMMUNITY): Payer: Self-pay

## 2012-01-22 ENCOUNTER — Encounter: Payer: Self-pay | Admitting: *Deleted

## 2012-01-22 DIAGNOSIS — K8689 Other specified diseases of pancreas: Secondary | ICD-10-CM | POA: Insufficient documentation

## 2012-01-22 DIAGNOSIS — Z79899 Other long term (current) drug therapy: Secondary | ICD-10-CM | POA: Insufficient documentation

## 2012-01-22 DIAGNOSIS — N281 Cyst of kidney, acquired: Secondary | ICD-10-CM | POA: Insufficient documentation

## 2012-01-22 DIAGNOSIS — M799 Soft tissue disorder, unspecified: Secondary | ICD-10-CM | POA: Insufficient documentation

## 2012-01-22 DIAGNOSIS — K5730 Diverticulosis of large intestine without perforation or abscess without bleeding: Secondary | ICD-10-CM | POA: Insufficient documentation

## 2012-01-22 DIAGNOSIS — I700 Atherosclerosis of aorta: Secondary | ICD-10-CM | POA: Insufficient documentation

## 2012-01-22 DIAGNOSIS — Z923 Personal history of irradiation: Secondary | ICD-10-CM | POA: Insufficient documentation

## 2012-01-22 DIAGNOSIS — K7689 Other specified diseases of liver: Secondary | ICD-10-CM | POA: Insufficient documentation

## 2012-01-22 DIAGNOSIS — C787 Secondary malignant neoplasm of liver and intrahepatic bile duct: Secondary | ICD-10-CM | POA: Insufficient documentation

## 2012-01-22 DIAGNOSIS — K838 Other specified diseases of biliary tract: Secondary | ICD-10-CM | POA: Insufficient documentation

## 2012-01-22 DIAGNOSIS — C259 Malignant neoplasm of pancreas, unspecified: Secondary | ICD-10-CM

## 2012-01-22 DIAGNOSIS — N289 Disorder of kidney and ureter, unspecified: Secondary | ICD-10-CM | POA: Insufficient documentation

## 2012-01-22 DIAGNOSIS — M8448XA Pathological fracture, other site, initial encounter for fracture: Secondary | ICD-10-CM | POA: Insufficient documentation

## 2012-01-22 DIAGNOSIS — I2510 Atherosclerotic heart disease of native coronary artery without angina pectoris: Secondary | ICD-10-CM | POA: Insufficient documentation

## 2012-01-22 DIAGNOSIS — Z9089 Acquired absence of other organs: Secondary | ICD-10-CM | POA: Insufficient documentation

## 2012-01-22 HISTORY — DX: Essential (primary) hypertension: I10

## 2012-01-22 MED ORDER — IOHEXOL 300 MG/ML  SOLN
100.0000 mL | Freq: Once | INTRAMUSCULAR | Status: AC | PRN
  Administered 2012-01-22: 100 mL via INTRAVENOUS

## 2012-01-22 NOTE — Progress Notes (Signed)
labwork from Dr. Miguel Rota office given to Dr Donnald Garre to review.

## 2012-01-26 ENCOUNTER — Telehealth: Payer: Self-pay | Admitting: Internal Medicine

## 2012-01-26 ENCOUNTER — Ambulatory Visit (HOSPITAL_BASED_OUTPATIENT_CLINIC_OR_DEPARTMENT_OTHER): Payer: Medicare Other

## 2012-01-26 ENCOUNTER — Ambulatory Visit (HOSPITAL_BASED_OUTPATIENT_CLINIC_OR_DEPARTMENT_OTHER): Payer: Medicare Other | Admitting: Internal Medicine

## 2012-01-26 ENCOUNTER — Other Ambulatory Visit (HOSPITAL_BASED_OUTPATIENT_CLINIC_OR_DEPARTMENT_OTHER): Payer: Medicare Other | Admitting: Lab

## 2012-01-26 VITALS — BP 123/65 | HR 90 | Temp 97.1°F | Ht 67.0 in | Wt 150.6 lb

## 2012-01-26 DIAGNOSIS — C787 Secondary malignant neoplasm of liver and intrahepatic bile duct: Secondary | ICD-10-CM

## 2012-01-26 DIAGNOSIS — C50919 Malignant neoplasm of unspecified site of unspecified female breast: Secondary | ICD-10-CM

## 2012-01-26 DIAGNOSIS — C252 Malignant neoplasm of tail of pancreas: Secondary | ICD-10-CM

## 2012-01-26 DIAGNOSIS — C259 Malignant neoplasm of pancreas, unspecified: Secondary | ICD-10-CM

## 2012-01-26 DIAGNOSIS — Z5111 Encounter for antineoplastic chemotherapy: Secondary | ICD-10-CM

## 2012-01-26 DIAGNOSIS — G609 Hereditary and idiopathic neuropathy, unspecified: Secondary | ICD-10-CM

## 2012-01-26 LAB — COMPREHENSIVE METABOLIC PANEL
AST: 136 U/L — ABNORMAL HIGH (ref 0–37)
Albumin: 3.1 g/dL — ABNORMAL LOW (ref 3.5–5.2)
Alkaline Phosphatase: 455 U/L — ABNORMAL HIGH (ref 39–117)
Chloride: 101 mEq/L (ref 96–112)
Glucose, Bld: 126 mg/dL — ABNORMAL HIGH (ref 70–99)
Potassium: 4.8 mEq/L (ref 3.5–5.3)
Sodium: 133 mEq/L — ABNORMAL LOW (ref 135–145)
Total Protein: 6.2 g/dL (ref 6.0–8.3)

## 2012-01-26 LAB — CBC WITH DIFFERENTIAL/PLATELET
EOS%: 2.7 % (ref 0.0–7.0)
Eosinophils Absolute: 0.2 10*3/uL (ref 0.0–0.5)
MCV: 101.4 fL — ABNORMAL HIGH (ref 79.3–98.0)
MONO%: 36.1 % — ABNORMAL HIGH (ref 0.0–14.0)
NEUT#: 1.9 10*3/uL (ref 1.5–6.5)
RBC: 2.78 10*6/uL — ABNORMAL LOW (ref 4.20–5.82)
RDW: 17.3 % — ABNORMAL HIGH (ref 11.0–14.6)
WBC: 5.5 10*3/uL (ref 4.0–10.3)
lymph#: 1.4 10*3/uL (ref 0.9–3.3)

## 2012-01-26 MED ORDER — ONDANSETRON 8 MG/50ML IVPB (CHCC)
8.0000 mg | Freq: Once | INTRAVENOUS | Status: AC
  Administered 2012-01-26: 8 mg via INTRAVENOUS

## 2012-01-26 MED ORDER — SODIUM CHLORIDE 0.9 % IV SOLN
2400.0000 mg/m2 | INTRAVENOUS | Status: DC
  Administered 2012-01-26: 4450 mg via INTRAVENOUS
  Filled 2012-01-26: qty 89

## 2012-01-26 MED ORDER — DEXTROSE 5 % IV SOLN
Freq: Once | INTRAVENOUS | Status: AC
  Administered 2012-01-26: 12:00:00 via INTRAVENOUS

## 2012-01-26 MED ORDER — LEUCOVORIN CALCIUM INJECTION 350 MG
400.0000 mg/m2 | Freq: Once | INTRAVENOUS | Status: AC
  Administered 2012-01-26: 744 mg via INTRAVENOUS
  Filled 2012-01-26: qty 37.2

## 2012-01-26 MED ORDER — DEXAMETHASONE SODIUM PHOSPHATE 10 MG/ML IJ SOLN
10.0000 mg | Freq: Once | INTRAMUSCULAR | Status: AC
  Administered 2012-01-26: 10 mg via INTRAVENOUS

## 2012-01-26 MED ORDER — FLUOROURACIL CHEMO INJECTION 2.5 GM/50ML
400.0000 mg/m2 | Freq: Once | INTRAVENOUS | Status: AC
  Administered 2012-01-26: 750 mg via INTRAVENOUS
  Filled 2012-01-26: qty 15

## 2012-01-26 NOTE — Telephone Encounter (Signed)
appts made and printed for pt aom °

## 2012-01-26 NOTE — Progress Notes (Signed)
[facility] Telephone:(336) [phone]   Fax:(336) [phone]  OFFICE PROGRESS NOTE  [name], MD, MD Dr. Burnell Blanks [address] [place] [address]  DIAGNOSIS: Metastatic pancreatic adenocarcinoma, now with metastatic disease to the liver and the abdominal wall, initially diagnosed with stage IIIB(T3, N1, M0) pancreatic tail adenocarcinoma in November of 2011.   PRIOR THERAPY:  #1 distal pancreatectomy and splenectomy on 06/25/2010  #2 adjuvant chemotherapy with gemcitabine started 08/20/2010 and completed 2 cycles on 12/02/2010.  #3 status post concurrent chemoradiation with Xeloda from 12/15/2010 on 10 01/24/2011.  #4 status post 2 more cycles of adjuvant gemcitabine with gemcitabine completed on 04/04/2011.  #5 FOLFOX giving every 2 weeks status post 12 cycles.   CURRENT THERAPY: Continuous infusion 5-FU and leucovorin every 2 weeks. The patient will start the first cycle today.   INTERVAL HISTORY: Philip Shaw 77 y.o. male returns to the clinic today for followup visit accompanied by his wife and daughter. The patient is tolerating his treatment fairly well except for increased numbness and tingling is in the fingers and toes secondary to oxaliplatin. He denied having any significant nausea or vomiting, no constipation. No significant weight loss or night sweats. He has no chest pain or shortness of breath and no abdominal pain. He has repeat CT scan of the chest, abdomen and pelvis performed recently and he is here today for evaluation and discussion of his scan results.  MEDICAL HISTORY: Past Medical History  Diagnosis Date  . Heart murmur   . Abdominal pain, periumbilical   . Constipation   . pancreatic ca dx'd 04/2010  . Diabetes mellitus   . Hypertension     ALLERGIES:   has no known allergies.  MEDICATIONS:  Current Outpatient Prescriptions  Medication Sig Dispense Refill  . aspirin 81 MG tablet Take 81 mg by mouth every  morning.       . insulin glargine (LANTUS) 100 UNIT/ML injection Inject into the skin at bedtime. 37 units, sliding scale      . lidocaine-prilocaine (EMLA) cream Apply 1 application topically as needed.  30 g  2  . mirtazapine (REMERON) 30 MG tablet Take 1 tablet (30 mg total) by mouth at bedtime.  90 tablet  0  . ondansetron (ZOFRAN-ODT) 8 MG disintegrating tablet       . oxyCODONE (OXYCONTIN) 10 MG 12 hr tablet Take 1 tablet (10 mg total) by mouth every 12 (twelve) hours.  60 tablet  0  . pravastatin (PRAVACHOL) 20 MG tablet Take 20 mg by mouth at bedtime.       . ranitidine (ZANTAC) 150 MG tablet Take 150 mg by mouth 2 (two) times daily.       Marland Kitchen terazosin (HYTRIN) 2 MG capsule Take 2 mg by mouth 2 (two) times daily.       . Alum & Mag Hydroxide-Simeth (MAGIC MOUTHWASH) SOLN Take 5 mLs by mouth QID.  240 mL  0  . Docusate Sodium (COLACE PO) Take 2 tablets by mouth 2 (two) times daily.        Marland Kitchen glimepiride (AMARYL) 4 MG tablet       . magnesium hydroxide (MILK OF MAGNESIA) 400 MG/5ML suspension Take by mouth daily.        . Multiple Vitamins-Minerals (CENTRUM PO) Take 1 tablet by mouth daily.       Marland Kitchen oxyCODONE (OXY IR/ROXICODONE) 5 MG immediate release tablet Take 1 tablet (5 mg total) by mouth every 4 (  four) hours as needed.  30 tablet  0  . DISCONTD: metFORMIN (GLUCOPHAGE) 850 MG tablet Take 850 mg by mouth 2 (two) times daily with a meal.        No current facility-administered medications for this visit.   Facility-Administered Medications Ordered in Other Visits  Medication Dose Route Frequency Provider Last Rate Last Dose  . fluorouracil (ADRUCIL) 4,450 mg in sodium chloride 0.9 % 150 mL chemo infusion  2,400 mg/m2 (Treatment Plan Actual) Intravenous 1 day or 1 dose Si Gaul, MD   4,450 mg at 08/18/11 1156    SURGICAL HISTORY:  Past Surgical History  Procedure Date  . Appendectomy 42-36 years old  . Inguinal hernia repair as infant, age 45    LIH- infant, RIH age 61  .  Pancreatectomy 06/2010    with splenectomy    REVIEW OF SYSTEMS:  A comprehensive review of systems was negative.   PHYSICAL EXAMINATION: General appearance: alert, cooperative and no distress Neck: no adenopathy Lymph nodes: Cervical, supraclavicular, and axillary nodes normal. Resp: clear to auscultation bilaterally Cardio: regular rate and rhythm, S1, S2 normal, no murmur, click, rub or gallop GI: soft, non-tender; bowel sounds normal; no masses,  no organomegaly Extremities: extremities normal, atraumatic, no cyanosis or edema Neurologic: Alert and oriented X 3, normal strength and tone. Normal symmetric reflexes. Normal coordination and gait  ECOG PERFORMANCE STATUS: 1 - Symptomatic but completely ambulatory  Blood pressure 123/65, pulse 90, temperature 97.1 F (36.2 C), temperature source Oral, height 5\' 7"  (1.702 m), weight 150 lb 9.6 oz (68.312 kg).  LABORATORY DATA: Lab Results  Component Value Date   WBC 5.5 01/26/2012   HGB 9.7* 01/26/2012   HCT 28.2* 01/26/2012   MCV 101.4* 01/26/2012   PLT 177 01/26/2012      Chemistry      Component Value Date/Time   NA 133* 12/30/2011 1032   K 4.5 12/30/2011 1032   CL 102 12/30/2011 1032   CO2 23 12/30/2011 1032   BUN 23 12/30/2011 1032   CREATININE 0.65 12/30/2011 1032      Component Value Date/Time   CALCIUM 8.6 12/30/2011 1032   ALKPHOS 291* 12/30/2011 1032   AST 66* 12/30/2011 1032   ALT 44 12/30/2011 1032   BILITOT 0.7 12/30/2011 1032       RADIOGRAPHIC STUDIES: Ct Chest W Contrast  01/22/2012  *RADIOLOGY REPORT*  Clinical Data:  History of pancreatic cancer.  Chemotherapy ongoing.  Radiation therapy completed 01/20/2011.  CT CHEST, ABDOMEN AND PELVIS WITH CONTRAST  Technique:  Multidetector CT imaging of the chest, abdomen and pelvis was performed following the standard protocol during bolus administration of intravenous contrast.  Contrast: OMNIPAQUE IOHEXOL 300 MG/ML  SOLN  Comparison:  CT of chest, abdomen and pelvis 10/24/2011.     CT CHEST  Findings:  Mediastinum: Heart size is normal. There is no significant pericardial fluid, thickening or pericardial calcification. There is atherosclerosis of the thoracic aorta, the great vessels of the mediastinum and the coronary arteries, including calcified atherosclerotic plaque in the left anterior descending and right coronary arteries. Right internal jugular single lumen Port-A-Cath with tip terminating at the superior cavoatrial junction. No pathologically enlarged mediastinal or hilar lymph nodes. Esophagus is unremarkable in appearance.  Lungs/Pleura: The subtle area of peribronchovascular micronodularity in the right lower lobe noted on the prior examination is no longer evident on today's study, suggesting that it was of infectious or inflammatory etiology on the prior examination.  Multiple small calcified granulomas  are noted ( on image 35 of series 7 there is a calcified granuloma in the right lower lobe, and a subpleural calcified granuloma in the posterior left lower lobe).  No larger more suspicious appearing pulmonary nodules or masses are otherwise identified.  No consolidative airspace disease.  No pleural effusions.  Musculoskeletal: There is a new slightly expansile lucent lesion in the inferior aspect of the right scapula with an internal lucency suspicious for a pathologic fracture.  No other definite aggressive appearing lytic or blastic lesions are appreciated in the visualized portions of the skeleton. A 1.6 x 1.1 cm intermediate attenuation (31 HU) subcutaneous lesion in the right anterior chest wall is slightly larger than the prior examination.  IMPRESSION:  1.  New expansile lucent lesion with associated pathologic fracture in the inferior aspect of the right scapula likely represents a bony metastasis. 2.  No findings to suggest metastatic disease to the lungs on today's examination.  3. Atherosclerosis, including LAD and RCA coronary artery disease. Please note that  although the presence of coronary artery calcium documents the presence of coronary artery disease, the severity of this disease and any potential stenosis cannot be assessed on this non-gated CT examination.  Assessment for potential risk factor modification, dietary therapy or pharmacologic therapy may be warranted, if clinically indicated. 4.  Slight interval enlargement of what is now a 1.6 x 1.1 cm intermediate attenuation lesion in the anterior right chest subcutaneous soft tissues.  This is favored to represent a sebaceous cyst, but should be amendable to direct inspection.   CT ABDOMEN AND PELVIS  Findings:  Abdomen/Pelvis: Previously noted enhancing lesion in segment 8 of the liver adjacent to the dome of the liver is no longer clearly identified (this would be expected to be seen on image 8 of series 2).  Sub centimeter low attenuation lesion in segment 8 of the liver more anteriorly associated with the dome (image 11 of series 2) is unchanged compared to the prior study, and too small to characterize.  Previously suspected lesion in segment 7 of the liver is not clearly identified on today's examination (this would be expected to be visualized on image 84 of series 6).  No definite new or enlarging hepatic lesions are noted on today's examination. However, there is slight worsening of mild intrahepatic biliary ductal dilatation.  The gallbladder appears mildly distended, but there is no pericholecystic fluid or wall thickening at this time to suggest acute cholecystitis.  Common bile duct is normal in caliber.  Postoperative changes of distal pancreatectomy are again noted.  In the resection bed in the expected location of the body and distal tail of the pancreas there is some amorphous soft tissue and stranding which is new compared to the prior study. This extends along the left anterior pararenal space to the left paracolic gutter.  These findings are best demonstrated on images 35, 37 and 41 of  series 2.  The head and uncinate process of the pancreas are again mildly atrophic, and there continues to be dilatation of the distal pancreatic duct which abruptly terminates at a large calcification (12 mm), similar to the prior examination.  Multi focal small parenchymal calcifications are seen throughout the head and uncinate process of the pancreas, likely sequelae of chronic pancreatitis.  The spleen is surgically absent.  The adrenal glands are unremarkable in appearance bilaterally.  Multiple nonobstructive calculi are again seen in the collecting systems of the kidneys bilaterally.  Multiple low attenuation renal lesions are again  noted bilaterally, which appears similar in size number and distribution to prior examinations.  The largest lesions are compatible with simple cysts, while the smaller lesions remain too small to definitively characterize.  There is also a multi septated cyst in the lower pole of the right kidney which appears unchanged.  There is extensive colonic diverticulosis without definitive surrounding inflammatory changes to suggest acute diverticulitis at this time.  Again noted are large areas of abnormal soft tissue associated with the omentum in the left upper quadrant, concerning for omental implants.  These appear similar to the prior examination, and the most conspicuous lesion in the left upper quadrant of the abdomen is associated with increasing desmoplastic appearing soft tissue along its medial margin (demonstrated on image 102 of series 6), suggesting some forming adhesions with adjacent structures.  Previously noted soft tissue endplate anteriorly has also increased in size, currently measuring 1.8 cm in diameter (previously 1.4 cm in diameter).  Musculoskeletal: There are no aggressive appearing lytic or blastic lesions noted in the visualized portions of the skeleton.  IMPRESSION:  1.  Today's study demonstrates an apparent mixed response to therapy.  Specifically, the  previously noted hepatic metastasis is no longer visualized at all.  However, previously noted omental implants appears slightly larger. 2.  Additionally, there is ill-defined amorphous soft tissue and/or stranding forming in the resection bed (in the prior location of the body and tail of the pancreas) that could represent some neoplastic tissue.  Alternatively, these findings may be indicative of acute pancreatitis.  Clinical correlation is recommended. 3.  Persistent pancreatic ductal dilatation with a large stone in the distal pancreatic duct again noted. 4.  Today's study demonstrates slight increase in mild intrahepatic biliary ductal dilatation without definite cause. 4.  Multiple cystic lesions again noted throughout the kidneys bilaterally.  These appear similar to priors.  The vast majority of these are simple cysts, while there are several that are too small to characterize, and there is a complex multi septated cyst in the lower pole of the right kidney which is similar to the prior examination as well. 5.  Nonobstructive calculi within the collecting systems of the kidneys bilaterally again noted. 6.  Extensive colonic diverticulosis without definite findings to suggest acute diverticulitis on today's examination. 7.  Extensive atherosclerosis.  Original Report Authenticated By: Florencia Reasons, M.D.     ASSESSMENT: This is a very pleasant 77 years old white male with metastatic pancreatic adenocarcinoma recently treated with 12 cycles of FOLFOX with mixed response. He has significant improvement in his liver disease but the slightly larger omental implants. The questionable new lucent lesion in the right scapula was most likely secondary to recent trauma that the patient had. The patient is currently asymptomatic and has significant improvement in his general condition in the last few weeks.  PLAN: I discussed the scan results with the patient and his family. I also has a lengthy discussion  with him about his treatment options. I recommended for him to discontinue treatment with oxaliplatin at this point secondary to increased peripheral neuropathy. I would consider the patient for treatment with continuous infusion 5-FU and leucovorin with the same doses of the FOLFOX regimen. He agreed to the current plan and will start the first cycle of this treatment today. He would come back for followup visit in 2 weeks for evaluation and management any adverse effect of his treatment.  All questions were answered. The patient knows to call the clinic with any problems, questions  or concerns. We can certainly see the patient much sooner if necessary.  I spent 20 minutes counseling the patient face to face. The total time spent in the appointment was 30 minutes.

## 2012-01-26 NOTE — Patient Instructions (Signed)
[facility] Discharge Instructions for Patients Receiving Chemotherapy  Today you received the following chemotherapy agents LEUCOVORIN/5FU To help prevent nausea and vomiting after your treatment, we encourage you to take your nausea medication. Begin taking it at [facility] and take it as often as prescribed for the next 24-48 hours.   If you develop nausea and vomiting that is not controlled by your nausea medication, call the clinic. If it is after clinic hours your family physician or the after hours number for the clinic or go to the Emergency Department.   BELOW ARE SYMPTOMS THAT SHOULD BE REPORTED IMMEDIATELY:  *FEVER GREATER THAN 100.5 F  *CHILLS WITH OR WITHOUT FEVER  NAUSEA AND VOMITING THAT IS NOT CONTROLLED WITH YOUR NAUSEA MEDICATION  *UNUSUAL SHORTNESS OF BREATH  *UNUSUAL BRUISING OR BLEEDING  TENDERNESS IN MOUTH AND THROAT WITH OR WITHOUT PRESENCE OF ULCERS  *URINARY PROBLEMS  *BOWEL PROBLEMS  UNUSUAL RASH Items with * indicate a potential emergency and should be followed up as soon as possible.  One of the nurses will contact you 24 hours after your treatment. Please let the nurse know about any problems that you may have experienced. Feel free to call the clinic you have any questions or concerns. The clinic phone number is [phone].   I have been informed and understand all the instructions given to me. I know to contact the clinic, my physician, or go to the Emergency Department if any problems should occur. I do not have any questions at this time, but understand that I may call the clinic during office hours   should I have any questions or need assistance in obtaining follow up care.    __________________________________________  _____________  __________ Signature of Patient or Authorized Representative            Date                   Time    __________________________________________ Nurse's Signature

## 2012-01-28 ENCOUNTER — Ambulatory Visit (HOSPITAL_BASED_OUTPATIENT_CLINIC_OR_DEPARTMENT_OTHER): Payer: Non-veteran care

## 2012-01-28 VITALS — BP 133/72 | HR 85 | Temp 97.5°F

## 2012-01-28 DIAGNOSIS — C259 Malignant neoplasm of pancreas, unspecified: Secondary | ICD-10-CM

## 2012-01-28 DIAGNOSIS — C252 Malignant neoplasm of tail of pancreas: Secondary | ICD-10-CM

## 2012-01-28 DIAGNOSIS — C787 Secondary malignant neoplasm of liver and intrahepatic bile duct: Secondary | ICD-10-CM

## 2012-01-28 MED ORDER — SODIUM CHLORIDE 0.9 % IJ SOLN
10.0000 mL | INTRAMUSCULAR | Status: DC | PRN
  Administered 2012-01-28: 10 mL
  Filled 2012-01-28: qty 10

## 2012-01-28 MED ORDER — HEPARIN SOD (PORK) LOCK FLUSH 100 UNIT/ML IV SOLN
500.0000 [IU] | Freq: Once | INTRAVENOUS | Status: AC | PRN
  Administered 2012-01-28: 500 [IU]
  Filled 2012-01-28: qty 5

## 2012-02-02 ENCOUNTER — Other Ambulatory Visit: Payer: Self-pay | Admitting: Certified Registered Nurse Anesthetist

## 2012-02-09 ENCOUNTER — Ambulatory Visit (HOSPITAL_BASED_OUTPATIENT_CLINIC_OR_DEPARTMENT_OTHER): Payer: BC Managed Care – PPO

## 2012-02-09 ENCOUNTER — Other Ambulatory Visit: Payer: Non-veteran care

## 2012-02-09 ENCOUNTER — Other Ambulatory Visit (HOSPITAL_BASED_OUTPATIENT_CLINIC_OR_DEPARTMENT_OTHER): Payer: BC Managed Care – PPO | Admitting: Lab

## 2012-02-09 ENCOUNTER — Ambulatory Visit (HOSPITAL_BASED_OUTPATIENT_CLINIC_OR_DEPARTMENT_OTHER): Payer: Non-veteran care | Admitting: Physician Assistant

## 2012-02-09 ENCOUNTER — Encounter: Payer: Self-pay | Admitting: Physician Assistant

## 2012-02-09 ENCOUNTER — Telehealth: Payer: Self-pay | Admitting: Internal Medicine

## 2012-02-09 VITALS — BP 112/68 | HR 88 | Temp 97.3°F | Ht 67.0 in | Wt 150.1 lb

## 2012-02-09 DIAGNOSIS — C787 Secondary malignant neoplasm of liver and intrahepatic bile duct: Secondary | ICD-10-CM

## 2012-02-09 DIAGNOSIS — C259 Malignant neoplasm of pancreas, unspecified: Secondary | ICD-10-CM

## 2012-02-09 DIAGNOSIS — C50919 Malignant neoplasm of unspecified site of unspecified female breast: Secondary | ICD-10-CM

## 2012-02-09 DIAGNOSIS — C779 Secondary and unspecified malignant neoplasm of lymph node, unspecified: Secondary | ICD-10-CM

## 2012-02-09 DIAGNOSIS — Z5111 Encounter for antineoplastic chemotherapy: Secondary | ICD-10-CM

## 2012-02-09 DIAGNOSIS — C252 Malignant neoplasm of tail of pancreas: Secondary | ICD-10-CM

## 2012-02-09 LAB — COMPREHENSIVE METABOLIC PANEL
ALT: 81 U/L — ABNORMAL HIGH (ref 0–53)
AST: 98 U/L — ABNORMAL HIGH (ref 0–37)
Alkaline Phosphatase: 334 U/L — ABNORMAL HIGH (ref 39–117)
BUN: 23 mg/dL (ref 6–23)
Calcium: 8.8 mg/dL (ref 8.4–10.5)
Chloride: 103 mEq/L (ref 96–112)
Creatinine, Ser: 0.67 mg/dL (ref 0.50–1.35)
Total Bilirubin: 0.8 mg/dL (ref 0.3–1.2)

## 2012-02-09 LAB — CBC WITH DIFFERENTIAL/PLATELET
BASO%: 0.9 % (ref 0.0–2.0)
Basophils Absolute: 0 10*3/uL (ref 0.0–0.1)
EOS%: 3 % (ref 0.0–7.0)
Eosinophils Absolute: 0.1 10*3/uL (ref 0.0–0.5)
HCT: 30.4 % — ABNORMAL LOW (ref 38.4–49.9)
HGB: 10.2 g/dL — ABNORMAL LOW (ref 13.0–17.1)
LYMPH%: 25.4 % (ref 14.0–49.0)
MCH: 36.5 pg — ABNORMAL HIGH (ref 27.2–33.4)
MCHC: 33.5 g/dL (ref 32.0–36.0)
MCV: 109.1 fL — ABNORMAL HIGH (ref 79.3–98.0)
MONO#: 1.4 10*3/uL — ABNORMAL HIGH (ref 0.1–0.9)
MONO%: 31.4 % — ABNORMAL HIGH (ref 0.0–14.0)
NEUT#: 1.7 10*3/uL (ref 1.5–6.5)
NEUT%: 39.3 % (ref 39.0–75.0)
Platelets: 195 10*3/uL (ref 140–400)
RBC: 2.78 10*6/uL — ABNORMAL LOW (ref 4.20–5.82)
RDW: 18.3 % — ABNORMAL HIGH (ref 11.0–14.6)
WBC: 4.4 10*3/uL (ref 4.0–10.3)
lymph#: 1.1 10*3/uL (ref 0.9–3.3)

## 2012-02-09 MED ORDER — SODIUM CHLORIDE 0.9 % IJ SOLN
10.0000 mL | INTRAMUSCULAR | Status: DC | PRN
  Filled 2012-02-09: qty 10

## 2012-02-09 MED ORDER — HEPARIN SOD (PORK) LOCK FLUSH 100 UNIT/ML IV SOLN
500.0000 [IU] | Freq: Once | INTRAVENOUS | Status: DC | PRN
  Filled 2012-02-09: qty 5

## 2012-02-09 MED ORDER — DEXTROSE 5 % IV SOLN
Freq: Once | INTRAVENOUS | Status: AC
  Administered 2012-02-09: 13:00:00 via INTRAVENOUS

## 2012-02-09 MED ORDER — DEXAMETHASONE SODIUM PHOSPHATE 10 MG/ML IJ SOLN
10.0000 mg | Freq: Once | INTRAMUSCULAR | Status: AC
  Administered 2012-02-09: 10 mg via INTRAVENOUS

## 2012-02-09 MED ORDER — ONDANSETRON 8 MG/50ML IVPB (CHCC)
8.0000 mg | Freq: Once | INTRAVENOUS | Status: AC
  Administered 2012-02-09: 8 mg via INTRAVENOUS

## 2012-02-09 MED ORDER — DEXTROSE 5 % IV SOLN
400.0000 mg/m2 | Freq: Once | INTRAVENOUS | Status: AC
  Administered 2012-02-09: 744 mg via INTRAVENOUS
  Filled 2012-02-09: qty 37.2

## 2012-02-09 MED ORDER — SODIUM CHLORIDE 0.9 % IV SOLN
2400.0000 mg/m2 | INTRAVENOUS | Status: DC
  Administered 2012-02-09: 4450 mg via INTRAVENOUS
  Filled 2012-02-09: qty 89

## 2012-02-09 MED ORDER — ONDANSETRON HCL 8 MG PO TABS: 8.0000 mg | ORAL_TABLET | Freq: Three times a day (TID) | ORAL | Status: AC | PRN

## 2012-02-09 MED ORDER — FLUOROURACIL CHEMO INJECTION 2.5 GM/50ML
400.0000 mg/m2 | Freq: Once | INTRAVENOUS | Status: AC
  Administered 2012-02-09: 750 mg via INTRAVENOUS
  Filled 2012-02-09: qty 15

## 2012-02-09 NOTE — Progress Notes (Signed)
[facility] Telephone:(336) [phone]   Fax:(336) [phone]  OFFICE PROGRESS NOTE  [name], MD Dr. Sharman Crate Hamrick [address] [place] [address]  DIAGNOSIS: Metastatic pancreatic adenocarcinoma, now with metastatic disease to the liver and the abdominal wall, initially diagnosed with stage IIIB(T3, N1, M0) pancreatic tail adenocarcinoma in November of 2011.   PRIOR THERAPY:  #1 distal pancreatectomy and splenectomy on 06/25/2010  #2 adjuvant chemotherapy with gemcitabine started 08/20/2010 and completed 2 cycles on 12/02/2010.  #3 status post concurrent chemoradiation with Xeloda from 12/15/2010 on 10 01/24/2011.  #4 status post 2 more cycles of adjuvant gemcitabine with gemcitabine completed on 04/04/2011.  #5 FOLFOX giving every 2 weeks status post 12 cycles.   CURRENT THERAPY: Continuous infusion 5-FU and leucovorin every 2 weeks. Status post 1 cycle   INTERVAL HISTORY: Philip Shaw 77 y.o. male returns to the clinic today for followup visit accompanied by his wife. He tolerated the first cycle of continuous infusion 5-FU and leucovorin without difficulty. His numbness and tingling in fingers and toes remains at baseline. He continues to have difficulty buttoning his shirts and isn't dropping things. He does report that the night of chemotherapy he has significant night sweats not associated with any fever. The next day he is completely fine without any residual sweats. He denied any diarrhea or constipation.  He denied having any significant nausea or vomiting, no constipation. No significant weight loss or night sweats. He has no chest pain or shortness of breath and no abdominal pain.   MEDICAL HISTORY: Past Medical History  Diagnosis Date  . Heart murmur   . Abdominal pain, periumbilical   . Constipation   . pancreatic ca dx'd 04/2010  . Diabetes mellitus   . Hypertension     ALLERGIES:   has no known allergies.  MEDICATIONS:    Current Outpatient Prescriptions  Medication Sig Dispense Refill  . Alum & Mag Hydroxide-Simeth (MAGIC MOUTHWASH) SOLN Take 5 mLs by mouth QID.  240 mL  0  . aspirin 81 MG tablet Take 81 mg by mouth every morning.       Tery Sanfilippo Sodium (COLACE PO) Take 2 tablets by mouth 2 (two) times daily.        Marland Kitchen glimepiride (AMARYL) 4 MG tablet       . insulin glargine (LANTUS) 100 UNIT/ML injection Inject into the skin at bedtime. 37 units, sliding scale      . lidocaine-prilocaine (EMLA) cream Apply 1 application topically as needed.  30 g  2  . magnesium hydroxide (MILK OF MAGNESIA) 400 MG/5ML suspension Take by mouth daily.        . mirtazapine (REMERON) 30 MG tablet Take 1 tablet (30 mg total) by mouth at bedtime.  90 tablet  0  . Multiple Vitamins-Minerals (CENTRUM PO) Take 1 tablet by mouth daily.       . ondansetron (ZOFRAN) 8 MG tablet       . ondansetron (ZOFRAN) 8 MG tablet Take 1 tablet (8 mg total) by mouth every 8 (eight) hours as needed for nausea.  60 tablet  0  . ondansetron (ZOFRAN-ODT) 8 MG disintegrating tablet       . oxyCODONE (OXY IR/ROXICODONE) 5 MG immediate release tablet Take 1 tablet (5 mg total) by mouth every 4 (four) hours as needed.  30 tablet  0  . oxyCODONE (OXYCONTIN) 10 MG 12 hr tablet Take 1 tablet (10 mg total) by mouth every 12 (twelve)  hours.  60 tablet  0  . pravastatin (PRAVACHOL) 20 MG tablet Take 20 mg by mouth at bedtime.       . ranitidine (ZANTAC) 150 MG tablet Take 150 mg by mouth 2 (two) times daily.       . Tamsulosin HCl (FLOMAX) 0.4 MG CAPS       . terazosin (HYTRIN) 2 MG capsule Take 2 mg by mouth 2 (two) times daily.       Marland Kitchen DISCONTD: metFORMIN (GLUCOPHAGE) 850 MG tablet Take 850 mg by mouth 2 (two) times daily with a meal.        No current facility-administered medications for this visit.   Facility-Administered Medications Ordered in Other Visits  Medication Dose Route Frequency Provider Last Rate Last Dose  . dexamethasone (DECADRON)  injection 10 mg  10 mg Intravenous Once Si Gaul, MD   10 mg at 02/09/12 1230  . dextrose 5 % solution   Intravenous Once Si Gaul, MD      . fluorouracil (ADRUCIL) 4,450 mg in sodium chloride 0.9 % 150 mL chemo infusion  2,400 mg/m2 (Treatment Plan Actual) Intravenous 1 day or 1 dose Si Gaul, MD   4,450 mg at 02/09/12 1524  . fluorouracil (ADRUCIL) chemo injection 750 mg  400 mg/m2 (Treatment Plan Actual) Intravenous Once Si Gaul, MD   750 mg at 02/09/12 1519  . heparin lock flush 100 unit/mL  500 Units Intracatheter Once PRN Si Gaul, MD      . leucovorin 744 mg in dextrose 5 % 250 mL infusion  400 mg/m2 (Treatment Plan Actual) Intravenous Once Si Gaul, MD   744 mg at 02/09/12 1309  . ondansetron (ZOFRAN) IVPB 8 mg  8 mg Intravenous Once Si Gaul, MD   8 mg at 02/09/12 1230  . sodium chloride 0.9 % injection 10 mL  10 mL Intracatheter PRN Si Gaul, MD        SURGICAL HISTORY:  Past Surgical History  Procedure Date  . Appendectomy 4-56 years old  . Inguinal hernia repair as infant, age 23    LIH- infant, RIH age 30  . Pancreatectomy 06/2010    with splenectomy    REVIEW OF SYSTEMS:  Pertinent items are noted in HPI.   PHYSICAL EXAMINATION: General appearance: alert, cooperative and no distress Neck: no adenopathy Lymph nodes: Cervical, supraclavicular, and axillary nodes normal. Resp: clear to auscultation bilaterally Cardio: regular rate and rhythm, S1, S2 normal, no murmur, click, rub or gallop GI: soft, non-tender; bowel sounds normal; no masses,  no organomegaly Extremities: extremities normal, atraumatic, no cyanosis or edema Neurologic: Alert and oriented X 3, normal strength and tone. Normal symmetric reflexes. Normal coordination and gait  ECOG PERFORMANCE STATUS: 1 - Symptomatic but completely ambulatory  Blood pressure 112/68, pulse 88, temperature 97.3 F (36.3 C), temperature source Oral, height 5\' 7"  (1.702 m),  weight 150 lb 1.6 oz (68.085 kg).  LABORATORY DATA: Lab Results  Component Value Date   WBC 4.4 02/09/2012   HGB 10.2* 02/09/2012   HCT 30.4* 02/09/2012   MCV 109.1* 02/09/2012   PLT 195 02/09/2012      Chemistry      Component Value Date/Time   NA 133* 01/26/2012 1028   K 4.8 01/26/2012 1028   CL 101 01/26/2012 1028   CO2 26 01/26/2012 1028   BUN 25* 01/26/2012 1028   CREATININE 0.65 01/26/2012 1028      Component Value Date/Time   CALCIUM 8.6 01/26/2012 1028   ALKPHOS  455* 01/26/2012 1028   AST 136* 01/26/2012 1028   ALT 118* 01/26/2012 1028   BILITOT 1.0 01/26/2012 1028       RADIOGRAPHIC STUDIES: Ct Chest W Contrast  01/22/2012  *RADIOLOGY REPORT*  Clinical Data:  History of pancreatic cancer.  Chemotherapy ongoing.  Radiation therapy completed 01/20/2011.  CT CHEST, ABDOMEN AND PELVIS WITH CONTRAST  Technique:  Multidetector CT imaging of the chest, abdomen and pelvis was performed following the standard protocol during bolus administration of intravenous contrast.  Contrast: OMNIPAQUE IOHEXOL 300 MG/ML  SOLN  Comparison:  CT of chest, abdomen and pelvis 10/24/2011.   CT CHEST  Findings:  Mediastinum: Heart size is normal. There is no significant pericardial fluid, thickening or pericardial calcification. There is atherosclerosis of the thoracic aorta, the great vessels of the mediastinum and the coronary arteries, including calcified atherosclerotic plaque in the left anterior descending and right coronary arteries. Right internal jugular single lumen Port-A-Cath with tip terminating at the superior cavoatrial junction. No pathologically enlarged mediastinal or hilar lymph nodes. Esophagus is unremarkable in appearance.  Lungs/Pleura: The subtle area of peribronchovascular micronodularity in the right lower lobe noted on the prior examination is no longer evident on today's study, suggesting that it was of infectious or inflammatory etiology on the prior examination.  Multiple small calcified  granulomas are noted ( on image 35 of series 7 there is a calcified granuloma in the right lower lobe, and a subpleural calcified granuloma in the posterior left lower lobe).  No larger more suspicious appearing pulmonary nodules or masses are otherwise identified.  No consolidative airspace disease.  No pleural effusions.  Musculoskeletal: There is a new slightly expansile lucent lesion in the inferior aspect of the right scapula with an internal lucency suspicious for a pathologic fracture.  No other definite aggressive appearing lytic or blastic lesions are appreciated in the visualized portions of the skeleton. A 1.6 x 1.1 cm intermediate attenuation (31 HU) subcutaneous lesion in the right anterior chest wall is slightly larger than the prior examination.  IMPRESSION:  1.  New expansile lucent lesion with associated pathologic fracture in the inferior aspect of the right scapula likely represents a bony metastasis. 2.  No findings to suggest metastatic disease to the lungs on today's examination.  3. Atherosclerosis, including LAD and RCA coronary artery disease. Please note that although the presence of coronary artery calcium documents the presence of coronary artery disease, the severity of this disease and any potential stenosis cannot be assessed on this non-gated CT examination.  Assessment for potential risk factor modification, dietary therapy or pharmacologic therapy may be warranted, if clinically indicated. 4.  Slight interval enlargement of what is now a 1.6 x 1.1 cm intermediate attenuation lesion in the anterior right chest subcutaneous soft tissues.  This is favored to represent a sebaceous cyst, but should be amendable to direct inspection.   CT ABDOMEN AND PELVIS  Findings:  Abdomen/Pelvis: Previously noted enhancing lesion in segment 8 of the liver adjacent to the dome of the liver is no longer clearly identified (this would be expected to be seen on image 8 of series 2).  Sub centimeter low  attenuation lesion in segment 8 of the liver more anteriorly associated with the dome (image 11 of series 2) is unchanged compared to the prior study, and too small to characterize.  Previously suspected lesion in segment 7 of the liver is not clearly identified on today's examination (this would be expected to be visualized on image  84 of series 6).  No definite new or enlarging hepatic lesions are noted on today's examination. However, there is slight worsening of mild intrahepatic biliary ductal dilatation.  The gallbladder appears mildly distended, but there is no pericholecystic fluid or wall thickening at this time to suggest acute cholecystitis.  Common bile duct is normal in caliber.  Postoperative changes of distal pancreatectomy are again noted.  In the resection bed in the expected location of the body and distal tail of the pancreas there is some amorphous soft tissue and stranding which is new compared to the prior study. This extends along the left anterior pararenal space to the left paracolic gutter.  These findings are best demonstrated on images 35, 37 and 41 of series 2.  The head and uncinate process of the pancreas are again mildly atrophic, and there continues to be dilatation of the distal pancreatic duct which abruptly terminates at a large calcification (12 mm), similar to the prior examination.  Multi focal small parenchymal calcifications are seen throughout the head and uncinate process of the pancreas, likely sequelae of chronic pancreatitis.  The spleen is surgically absent.  The adrenal glands are unremarkable in appearance bilaterally.  Multiple nonobstructive calculi are again seen in the collecting systems of the kidneys bilaterally.  Multiple low attenuation renal lesions are again noted bilaterally, which appears similar in size number and distribution to prior examinations.  The largest lesions are compatible with simple cysts, while the smaller lesions remain too small to  definitively characterize.  There is also a multi septated cyst in the lower pole of the right kidney which appears unchanged.  There is extensive colonic diverticulosis without definitive surrounding inflammatory changes to suggest acute diverticulitis at this time.  Again noted are large areas of abnormal soft tissue associated with the omentum in the left upper quadrant, concerning for omental implants.  These appear similar to the prior examination, and the most conspicuous lesion in the left upper quadrant of the abdomen is associated with increasing desmoplastic appearing soft tissue along its medial margin (demonstrated on image 102 of series 6), suggesting some forming adhesions with adjacent structures.  Previously noted soft tissue endplate anteriorly has also increased in size, currently measuring 1.8 cm in diameter (previously 1.4 cm in diameter).  Musculoskeletal: There are no aggressive appearing lytic or blastic lesions noted in the visualized portions of the skeleton.  IMPRESSION:  1.  Today's study demonstrates an apparent mixed response to therapy.  Specifically, the previously noted hepatic metastasis is no longer visualized at all.  However, previously noted omental implants appears slightly larger. 2.  Additionally, there is ill-defined amorphous soft tissue and/or stranding forming in the resection bed (in the prior location of the body and tail of the pancreas) that could represent some neoplastic tissue.  Alternatively, these findings may be indicative of acute pancreatitis.  Clinical correlation is recommended. 3.  Persistent pancreatic ductal dilatation with a large stone in the distal pancreatic duct again noted. 4.  Today's study demonstrates slight increase in mild intrahepatic biliary ductal dilatation without definite cause. 4.  Multiple cystic lesions again noted throughout the kidneys bilaterally.  These appear similar to priors.  The vast majority of these are simple cysts, while  there are several that are too small to characterize, and there is a complex multi septated cyst in the lower pole of the right kidney which is similar to the prior examination as well. 5.  Nonobstructive calculi within the collecting systems of the kidneys  bilaterally again noted. 6.  Extensive colonic diverticulosis without definite findings to suggest acute diverticulitis on today's examination. 7.  Extensive atherosclerosis.  Original Report Authenticated By: Florencia Reasons, M.D.     ASSESSMENT/PLAN: This is a very pleasant 77 years old white male with metastatic pancreatic adenocarcinoma recently treated with 12 cycles of FOLFOX with mixed response. He has significant improvement in his liver disease but the slightly larger omental implants. The questionable new lucent lesion in the right scapula was most likely secondary to recent trauma that the patient had. The patient is currently asymptomatic and has significant improvement in his general condition in the last few weeks. The patient was discussed with Dr. Arbutus Ped. He will continue on his treatment with continuous infusion 5-FU and leucovorin given every 2 weeks. He will proceed with his scheduled cycle today. He'll followup with Dr. Arbutus Ped in 2 weeks with repeat CBC differential and C. met prior to his next scheduled cycle of chemotherapy with continuous infusion 5-FU and leucovorin. He requests a refill for his Zofran 8 mg tablets one tablet by mouth every 8 hours as needed for nausea a total of 60 tablets with no refill, generic substitution was authorized. This prescription was sent to his pharmacy of record via [facility]. We will continue to monitor his peripheral neuropathy symptoms numbness and he may benefit from either Neurontin or Lyrica in the future if his symptoms persist.  Philip Shaw, Amorah Sebring E, PA-C   All questions were answered. The patient knows to call the clinic with any problems, questions or concerns. We can certainly see the  patient much sooner if necessary.  I spent 20 minutes counseling the patient face to face. The total time spent in the appointment was 30 minutes.

## 2012-02-09 NOTE — Telephone Encounter (Signed)
Gave pt appt for lab, MD and chemo, called Marcelino Duster regarding chemo on 02/23/12

## 2012-02-09 NOTE — Patient Instructions (Addendum)
[facility] Discharge Instructions for Patients Receiving Chemotherapy  Today you received the following chemotherapy agents Fluorouracil, Fluorouracil Pump and Leucovorin  To help prevent nausea and vomiting after your treatment, we encourage you to take your nausea medication Begin taking it at 7 pm and take it as often as prescribed for the next  24 to 72 hours.   If you develop nausea and vomiting that is not controlled by your nausea medication, call the clinic. If it is after clinic hours your family physician or the after hours number for the clinic or go to the Emergency Department.   BELOW ARE SYMPTOMS THAT SHOULD BE REPORTED IMMEDIATELY:  *FEVER GREATER THAN 100.5 F  *CHILLS WITH OR WITHOUT FEVER  NAUSEA AND VOMITING THAT IS NOT CONTROLLED WITH YOUR NAUSEA MEDICATION  *UNUSUAL SHORTNESS OF BREATH  *UNUSUAL BRUISING OR BLEEDING  TENDERNESS IN MOUTH AND THROAT WITH OR WITHOUT PRESENCE OF ULCERS  *URINARY PROBLEMS  *BOWEL PROBLEMS  UNUSUAL RASH Items with * indicate a potential emergency and should be followed up as soon as possible.  One of the nurses will contact you 24 hours after your treatment. Please let the nurse know about any problems that you may have experienced. Feel free to call the clinic you have any questions or concerns. The clinic phone number is [phone].   I have been informed and understand all the instructions given to me. I know to contact the clinic, my physician, or go to the Emergency Department if any problems should occur. I do not have any questions at this time, but understand that I may call the clinic during office hours   should I have any questions or need assistance in obtaining follow up care.    __________________________________________  _____________  __________ Signature of Patient or Authorized Representative            Date                   Time    __________________________________________ Nurse's  Signature

## 2012-02-11 ENCOUNTER — Ambulatory Visit (HOSPITAL_BASED_OUTPATIENT_CLINIC_OR_DEPARTMENT_OTHER): Payer: Non-veteran care

## 2012-02-11 VITALS — BP 103/64 | HR 90 | Temp 97.5°F

## 2012-02-11 DIAGNOSIS — Z452 Encounter for adjustment and management of vascular access device: Secondary | ICD-10-CM

## 2012-02-11 DIAGNOSIS — C259 Malignant neoplasm of pancreas, unspecified: Secondary | ICD-10-CM

## 2012-02-11 DIAGNOSIS — C50919 Malignant neoplasm of unspecified site of unspecified female breast: Secondary | ICD-10-CM

## 2012-02-11 DIAGNOSIS — C252 Malignant neoplasm of tail of pancreas: Secondary | ICD-10-CM

## 2012-02-11 DIAGNOSIS — C787 Secondary malignant neoplasm of liver and intrahepatic bile duct: Secondary | ICD-10-CM

## 2012-02-11 MED ORDER — HEPARIN SOD (PORK) LOCK FLUSH 100 UNIT/ML IV SOLN
500.0000 [IU] | Freq: Once | INTRAVENOUS | Status: AC | PRN
  Administered 2012-02-11: 500 [IU]
  Filled 2012-02-11: qty 5

## 2012-02-11 MED ORDER — SODIUM CHLORIDE 0.9 % IJ SOLN
10.0000 mL | INTRAMUSCULAR | Status: DC | PRN
  Administered 2012-02-11: 10 mL
  Filled 2012-02-11: qty 10

## 2012-02-17 ENCOUNTER — Telehealth: Payer: Self-pay | Admitting: Internal Medicine

## 2012-02-17 NOTE — Telephone Encounter (Signed)
Returned patients call, patient was at work wife stated she would let him know that I returned his phone call.

## 2012-02-23 ENCOUNTER — Ambulatory Visit (HOSPITAL_BASED_OUTPATIENT_CLINIC_OR_DEPARTMENT_OTHER): Payer: Medicare Other

## 2012-02-23 ENCOUNTER — Telehealth: Payer: Self-pay | Admitting: Internal Medicine

## 2012-02-23 ENCOUNTER — Ambulatory Visit (HOSPITAL_BASED_OUTPATIENT_CLINIC_OR_DEPARTMENT_OTHER): Payer: Medicare Other | Admitting: Internal Medicine

## 2012-02-23 ENCOUNTER — Other Ambulatory Visit: Payer: Medicare Other

## 2012-02-23 VITALS — BP 123/75 | HR 76 | Temp 97.3°F | Ht 67.0 in | Wt 152.8 lb

## 2012-02-23 DIAGNOSIS — C259 Malignant neoplasm of pancreas, unspecified: Secondary | ICD-10-CM

## 2012-02-23 DIAGNOSIS — Z5111 Encounter for antineoplastic chemotherapy: Secondary | ICD-10-CM

## 2012-02-23 DIAGNOSIS — C787 Secondary malignant neoplasm of liver and intrahepatic bile duct: Secondary | ICD-10-CM

## 2012-02-23 DIAGNOSIS — C779 Secondary and unspecified malignant neoplasm of lymph node, unspecified: Secondary | ICD-10-CM

## 2012-02-23 DIAGNOSIS — C252 Malignant neoplasm of tail of pancreas: Secondary | ICD-10-CM

## 2012-02-23 DIAGNOSIS — C50919 Malignant neoplasm of unspecified site of unspecified female breast: Secondary | ICD-10-CM

## 2012-02-23 LAB — COMPREHENSIVE METABOLIC PANEL
AST: 44 U/L — ABNORMAL HIGH (ref 0–37)
Albumin: 3.4 g/dL — ABNORMAL LOW (ref 3.5–5.2)
Alkaline Phosphatase: 228 U/L — ABNORMAL HIGH (ref 39–117)
BUN: 22 mg/dL (ref 6–23)
Glucose, Bld: 106 mg/dL — ABNORMAL HIGH (ref 70–99)
Potassium: 4.3 mEq/L (ref 3.5–5.3)
Sodium: 138 mEq/L (ref 135–145)
Total Bilirubin: 0.9 mg/dL (ref 0.3–1.2)

## 2012-02-23 LAB — CBC WITH DIFFERENTIAL/PLATELET
Basophils Absolute: 0 10*3/uL (ref 0.0–0.1)
EOS%: 4.1 % (ref 0.0–7.0)
LYMPH%: 31.7 % (ref 14.0–49.0)
MCH: 34.6 pg — ABNORMAL HIGH (ref 27.2–33.4)
MCV: 101.7 fL — ABNORMAL HIGH (ref 79.3–98.0)
MONO%: 25.1 % — ABNORMAL HIGH (ref 0.0–14.0)
Platelets: 200 10*3/uL (ref 140–400)
RBC: 2.92 10*6/uL — ABNORMAL LOW (ref 4.20–5.82)
RDW: 17.7 % — ABNORMAL HIGH (ref 11.0–14.6)

## 2012-02-23 MED ORDER — DEXAMETHASONE SODIUM PHOSPHATE 10 MG/ML IJ SOLN
10.0000 mg | Freq: Once | INTRAMUSCULAR | Status: AC
  Administered 2012-02-23: 10 mg via INTRAVENOUS

## 2012-02-23 MED ORDER — FLUOROURACIL CHEMO INJECTION 2.5 GM/50ML
400.0000 mg/m2 | Freq: Once | INTRAVENOUS | Status: AC
  Administered 2012-02-23: 750 mg via INTRAVENOUS
  Filled 2012-02-23: qty 15

## 2012-02-23 MED ORDER — DEXTROSE 5 % IV SOLN
Freq: Once | INTRAVENOUS | Status: AC
  Administered 2012-02-23: 13:00:00 via INTRAVENOUS

## 2012-02-23 MED ORDER — ONDANSETRON 8 MG/50ML IVPB (CHCC)
8.0000 mg | Freq: Once | INTRAVENOUS | Status: AC
  Administered 2012-02-23: 8 mg via INTRAVENOUS

## 2012-02-23 MED ORDER — SODIUM CHLORIDE 0.9 % IJ SOLN
10.0000 mL | INTRAMUSCULAR | Status: DC | PRN
  Filled 2012-02-23: qty 10

## 2012-02-23 MED ORDER — LEUCOVORIN CALCIUM INJECTION 350 MG
400.0000 mg/m2 | Freq: Once | INTRAVENOUS | Status: AC
  Administered 2012-02-23: 744 mg via INTRAVENOUS
  Filled 2012-02-23: qty 37.2

## 2012-02-23 MED ORDER — HEPARIN SOD (PORK) LOCK FLUSH 100 UNIT/ML IV SOLN
500.0000 [IU] | Freq: Once | INTRAVENOUS | Status: DC | PRN
  Filled 2012-02-23: qty 5

## 2012-02-23 MED ORDER — SODIUM CHLORIDE 0.9 % IV SOLN
2400.0000 mg/m2 | INTRAVENOUS | Status: DC
  Administered 2012-02-23: 4450 mg via INTRAVENOUS
  Filled 2012-02-23: qty 89

## 2012-02-23 NOTE — Telephone Encounter (Signed)
appts made and ptrinted for pt aom °

## 2012-02-23 NOTE — Progress Notes (Signed)
[facility] Telephone:(336) [phone]   Fax:(336) [phone]  OFFICE PROGRESS NOTE  [name], MD Dr. Sharman Crate Hamrick [address] [place] [address]  DIAGNOSIS: Metastatic pancreatic adenocarcinoma, now with metastatic disease to the liver and the abdominal wall, initially diagnosed with stage IIIB(T3, N1, M0) pancreatic tail adenocarcinoma in November of 2011.   PRIOR THERAPY:  #1 distal pancreatectomy and splenectomy on 06/25/2010  #2 adjuvant chemotherapy with gemcitabine started 08/20/2010 and completed 2 cycles on 12/02/2010.  #3 status post concurrent chemoradiation with Xeloda from 12/15/2010 on 10 01/24/2011.  #4 status post 2 more cycles of adjuvant gemcitabine with gemcitabine completed on 04/04/2011.  #5 FOLFOX giving every 2 weeks status post 12 cycles.   CURRENT THERAPY: Continuous infusion 5-FU and leucovorin every 2 weeks. Status post 2 cycles.   INTERVAL HISTORY: Philip Shaw 77 y.o. male returns to the clinic today for followup visit accompanied by his wife. The patient is doing fine and tolerated the first cycle of his treatment with 5-FU and leucovorin fairly well. He still complaining of numbness and tingling is in his fingers and toes. He denied having any significant nausea or vomiting and no significant abdominal pain. He still on OxyContin 10 mg by mouth every morning. He denied having any change in his bowel movement. No significant chest pain or shortness of breath. No weight loss.  MEDICAL HISTORY: Past Medical History  Diagnosis Date  . Heart murmur   . Abdominal pain, periumbilical   . Constipation   . pancreatic ca dx'd 04/2010  . Diabetes mellitus   . Hypertension     ALLERGIES:   has no known allergies.  MEDICATIONS:  Current Outpatient Prescriptions  Medication Sig Dispense Refill  . Alum & Mag Hydroxide-Simeth (MAGIC MOUTHWASH) SOLN Take 5 mLs by mouth QID.  240 mL  0  . aspirin 81 MG tablet Take 81 mg by  mouth every morning.       Tery Sanfilippo Sodium (COLACE PO) Take 2 tablets by mouth 2 (two) times daily.        Marland Kitchen glimepiride (AMARYL) 4 MG tablet       . insulin glargine (LANTUS) 100 UNIT/ML injection Inject into the skin at bedtime. 37 units, sliding scale      . lidocaine-prilocaine (EMLA) cream Apply 1 application topically as needed.  30 g  2  . magnesium hydroxide (MILK OF MAGNESIA) 400 MG/5ML suspension Take by mouth daily.        . mirtazapine (REMERON) 30 MG tablet Take 1 tablet (30 mg total) by mouth at bedtime.  90 tablet  0  . Multiple Vitamins-Minerals (CENTRUM PO) Take 1 tablet by mouth daily.       . ondansetron (ZOFRAN) 8 MG tablet       . ondansetron (ZOFRAN-ODT) 8 MG disintegrating tablet       . oxyCODONE (OXY IR/ROXICODONE) 5 MG immediate release tablet Take 1 tablet (5 mg total) by mouth every 4 (four) hours as needed.  30 tablet  0  . oxyCODONE (OXYCONTIN) 10 MG 12 hr tablet Take 1 tablet (10 mg total) by mouth every 12 (twelve) hours.  60 tablet  0  . pravastatin (PRAVACHOL) 20 MG tablet Take 20 mg by mouth at bedtime.       . ranitidine (ZANTAC) 150 MG tablet Take 150 mg by mouth 2 (two) times daily.       . Tamsulosin HCl (FLOMAX) 0.4 MG CAPS       .  terazosin (HYTRIN) 2 MG capsule Take 2 mg by mouth 2 (two) times daily.       Marland Kitchen DISCONTD: metFORMIN (GLUCOPHAGE) 850 MG tablet Take 850 mg by mouth 2 (two) times daily with a meal.         SURGICAL HISTORY:  Past Surgical History  Procedure Date  . Appendectomy 38-61 years old  . Inguinal hernia repair as infant, age 64    LIH- infant, RIH age 34  . Pancreatectomy 06/2010    with splenectomy    REVIEW OF SYSTEMS:  A comprehensive review of systems was negative except for: Neurological: positive for paresthesia   PHYSICAL EXAMINATION: General appearance: alert, cooperative and no distress Head: Normocephalic, without obvious abnormality, atraumatic Neck: no adenopathy Lymph nodes: Cervical, supraclavicular, and  axillary nodes normal. Resp: clear to auscultation bilaterally Cardio: regular rate and rhythm, S1, S2 normal, no murmur, click, rub or gallop GI: soft, non-tender; bowel sounds normal; no masses,  no organomegaly Extremities: extremities normal, atraumatic, no cyanosis or edema Neurologic: Alert and oriented X 3, normal strength and tone. Normal symmetric reflexes. Normal coordination and gait  ECOG PERFORMANCE STATUS: 1 - Symptomatic but completely ambulatory  Blood pressure 123/75, pulse 76, temperature 97.3 F (36.3 C), temperature source Oral, height 5\' 7"  (1.702 m), weight 152 lb 12.8 oz (69.31 kg).  LABORATORY DATA: Lab Results  Component Value Date   WBC 3.9* 02/23/2012   HGB 10.1* 02/23/2012   HCT 29.7* 02/23/2012   MCV 101.7* 02/23/2012   PLT 200 02/23/2012      Chemistry      Component Value Date/Time   NA 137 02/09/2012 1031   K 4.6 02/09/2012 1031   CL 103 02/09/2012 1031   CO2 27 02/09/2012 1031   BUN 23 02/09/2012 1031   CREATININE 0.67 02/09/2012 1031      Component Value Date/Time   CALCIUM 8.8 02/09/2012 1031   ALKPHOS 334* 02/09/2012 1031   AST 98* 02/09/2012 1031   ALT 81* 02/09/2012 1031   BILITOT 0.8 02/09/2012 1031       RADIOGRAPHIC STUDIES: No results found.  ASSESSMENT: This is a very pleasant 77 years old white male with metastatic pancreatic adenocarcinoma currently on treatment with 5-FU and leucovorin. The patient is tolerating his treatment fairly well.  PLAN: We'll proceed with cycle #2 today as scheduled. The patient would come back for followup visit in 2 weeks for evaluation before starting cycle #3. He would continue his treatment with OxyContin for pain management.  He was advised to call me immediately if he has any concerning symptoms in the interval.  All questions were answered. The patient knows to call the clinic with any problems, questions or concerns. We can certainly see the patient much sooner if necessary.  I spent 15 minutes counseling  the patient face to face. The total time spent in the appointment was 25 minutes.

## 2012-02-25 ENCOUNTER — Ambulatory Visit (HOSPITAL_BASED_OUTPATIENT_CLINIC_OR_DEPARTMENT_OTHER): Payer: Medicare Other

## 2012-02-25 DIAGNOSIS — C252 Malignant neoplasm of tail of pancreas: Secondary | ICD-10-CM

## 2012-02-25 DIAGNOSIS — C259 Malignant neoplasm of pancreas, unspecified: Secondary | ICD-10-CM

## 2012-02-25 DIAGNOSIS — C787 Secondary malignant neoplasm of liver and intrahepatic bile duct: Secondary | ICD-10-CM

## 2012-02-25 DIAGNOSIS — C50919 Malignant neoplasm of unspecified site of unspecified female breast: Secondary | ICD-10-CM

## 2012-02-25 MED ORDER — HEPARIN SOD (PORK) LOCK FLUSH 100 UNIT/ML IV SOLN
500.0000 [IU] | Freq: Once | INTRAVENOUS | Status: AC | PRN
  Administered 2012-02-25: 500 [IU]
  Filled 2012-02-25: qty 5

## 2012-02-25 MED ORDER — SODIUM CHLORIDE 0.9 % IJ SOLN
10.0000 mL | INTRAMUSCULAR | Status: DC | PRN
  Administered 2012-02-25: 10 mL
  Filled 2012-02-25: qty 10

## 2012-03-08 ENCOUNTER — Ambulatory Visit (HOSPITAL_BASED_OUTPATIENT_CLINIC_OR_DEPARTMENT_OTHER): Payer: Medicare Other

## 2012-03-08 ENCOUNTER — Ambulatory Visit (HOSPITAL_BASED_OUTPATIENT_CLINIC_OR_DEPARTMENT_OTHER): Payer: Medicare Other | Admitting: Physician Assistant

## 2012-03-08 ENCOUNTER — Telehealth: Payer: Self-pay | Admitting: Internal Medicine

## 2012-03-08 ENCOUNTER — Other Ambulatory Visit (HOSPITAL_BASED_OUTPATIENT_CLINIC_OR_DEPARTMENT_OTHER): Payer: Medicare Other | Admitting: Lab

## 2012-03-08 ENCOUNTER — Encounter: Payer: Self-pay | Admitting: Physician Assistant

## 2012-03-08 VITALS — BP 122/77 | HR 90 | Temp 97.5°F | Ht 67.0 in | Wt 154.1 lb

## 2012-03-08 DIAGNOSIS — C252 Malignant neoplasm of tail of pancreas: Secondary | ICD-10-CM

## 2012-03-08 DIAGNOSIS — C50919 Malignant neoplasm of unspecified site of unspecified female breast: Secondary | ICD-10-CM

## 2012-03-08 DIAGNOSIS — C259 Malignant neoplasm of pancreas, unspecified: Secondary | ICD-10-CM

## 2012-03-08 DIAGNOSIS — C787 Secondary malignant neoplasm of liver and intrahepatic bile duct: Secondary | ICD-10-CM

## 2012-03-08 DIAGNOSIS — R52 Pain, unspecified: Secondary | ICD-10-CM

## 2012-03-08 DIAGNOSIS — C779 Secondary and unspecified malignant neoplasm of lymph node, unspecified: Secondary | ICD-10-CM

## 2012-03-08 DIAGNOSIS — Z5111 Encounter for antineoplastic chemotherapy: Secondary | ICD-10-CM

## 2012-03-08 LAB — COMPREHENSIVE METABOLIC PANEL
CO2: 31 mEq/L (ref 19–32)
Creatinine, Ser: 0.79 mg/dL (ref 0.50–1.35)
Glucose, Bld: 147 mg/dL — ABNORMAL HIGH (ref 70–99)
Total Bilirubin: 0.8 mg/dL (ref 0.3–1.2)

## 2012-03-08 LAB — CBC WITH DIFFERENTIAL/PLATELET
Basophils Absolute: 0 10*3/uL (ref 0.0–0.1)
EOS%: 4.2 % (ref 0.0–7.0)
Eosinophils Absolute: 0.2 10*3/uL (ref 0.0–0.5)
LYMPH%: 30 % (ref 14.0–49.0)
MCH: 34.3 pg — ABNORMAL HIGH (ref 27.2–33.4)
MCV: 101.7 fL — ABNORMAL HIGH (ref 79.3–98.0)
MONO%: 28.3 % — ABNORMAL HIGH (ref 0.0–14.0)
NEUT#: 1.6 10*3/uL (ref 1.5–6.5)
Platelets: 227 10*3/uL (ref 140–400)
RBC: 2.97 10*6/uL — ABNORMAL LOW (ref 4.20–5.82)
nRBC: 0 % (ref 0–0)

## 2012-03-08 LAB — MAGNESIUM: Magnesium: 1.8 mg/dL (ref 1.5–2.5)

## 2012-03-08 MED ORDER — ONDANSETRON HCL 8 MG PO TABS: 8.0000 mg | ORAL_TABLET | Freq: Two times a day (BID) | ORAL | Status: DC | PRN

## 2012-03-08 MED ORDER — DEXAMETHASONE SODIUM PHOSPHATE 10 MG/ML IJ SOLN
10.0000 mg | Freq: Once | INTRAMUSCULAR | Status: AC
  Administered 2012-03-08: 10 mg via INTRAVENOUS

## 2012-03-08 MED ORDER — LEUCOVORIN CALCIUM INJECTION 350 MG
400.0000 mg/m2 | Freq: Once | INTRAVENOUS | Status: AC
  Administered 2012-03-08: 744 mg via INTRAVENOUS
  Filled 2012-03-08: qty 37.2

## 2012-03-08 MED ORDER — ONDANSETRON 8 MG/50ML IVPB (CHCC)
8.0000 mg | Freq: Once | INTRAVENOUS | Status: AC
  Administered 2012-03-08: 8 mg via INTRAVENOUS

## 2012-03-08 MED ORDER — SODIUM CHLORIDE 0.9 % IV SOLN
2400.0000 mg/m2 | INTRAVENOUS | Status: DC
  Administered 2012-03-08: 4450 mg via INTRAVENOUS
  Filled 2012-03-08: qty 89

## 2012-03-08 MED ORDER — FLUOROURACIL CHEMO INJECTION 2.5 GM/50ML
400.0000 mg/m2 | Freq: Once | INTRAVENOUS | Status: AC
  Administered 2012-03-08: 750 mg via INTRAVENOUS
  Filled 2012-03-08: qty 15

## 2012-03-08 MED ORDER — OXYCODONE HCL 10 MG PO TB12: 10.0000 mg | ORAL_TABLET | Freq: Two times a day (BID) | ORAL | Status: DC

## 2012-03-08 NOTE — Telephone Encounter (Signed)
appts complete s/w pt re appt for 7/29 and made pt aware that July and August appts are ready. Pt to get schedule when he comes in for pump d/c 7/17.

## 2012-03-08 NOTE — Progress Notes (Signed)
[facility] Telephone:(336) [phone]   Fax:(336) [phone]  OFFICE PROGRESS NOTE  [name], MD Dr. Sharman Crate [place]rick [address] [place] [address]  DIAGNOSIS: Metastatic pancreatic adenocarcinoma, now with metastatic disease to the liver and the abdominal wall, initially diagnosed with stage IIIB(T3, N1, M0) pancreatic tail adenocarcinoma in November of 2011.   PRIOR THERAPY:  #1 distal pancreatectomy and splenectomy on 06/25/2010  #2 adjuvant chemotherapy with gemcitabine started 08/20/2010 and completed 2 cycles on 12/02/2010.  #3 status post concurrent chemoradiation with Xeloda from 12/15/2010 on 10 01/24/2011.  #4 status post 2 more cycles of adjuvant gemcitabine with gemcitabine completed on 04/04/2011.  #5 FOLFOX giving every 2 weeks status post 12 cycles.   CURRENT THERAPY: Continuous infusion 5-FU and leucovorin every 2 weeks. Status post 2 cycles.   INTERVAL HISTORY: Philip Shaw 77 y.o. male returns to the clinic today for followup visit accompanied by his wife. The patient is doing fine and tolerated the first cycle of his treatment with 5-FU and leucovorin fairly well. He still complaining of numbness and tingling is in his fingers and toes. He denied having any significant nausea or vomiting and no significant abdominal pain. He managed play 18 holes of golf on Saturday. He continues to not eat solids fairly well but managed is to drink 8-10 8 ounce protein shakes made with milk daily. He is drinking some water but admits that it's probably not enough. He reports that he has night sweats the night but he has chemotherapy but they spontaneously resolved and he has none further until the next cycle of chemotherapy. He requests refills for his OxyContin and Zofran tablets.  He denied having any change in his bowel movement. No significant chest pain or shortness of breath. He has actually gained some weight.  MEDICAL HISTORY: Past Medical  History  Diagnosis Date  . Heart murmur   . Abdominal pain, periumbilical   . Constipation   . pancreatic ca dx'd 04/2010  . Diabetes mellitus   . Hypertension     ALLERGIES:   has no known allergies.  MEDICATIONS:  Current Outpatient Prescriptions  Medication Sig Dispense Refill  . Alum & Mag Hydroxide-Simeth (MAGIC MOUTHWASH) SOLN Take 5 mLs by mouth QID.  240 mL  0  . aspirin 81 MG tablet Take 81 mg by mouth every morning.       Tery Sanfilippo Sodium (COLACE PO) Take 2 tablets by mouth 2 (two) times daily.        Marland Kitchen glimepiride (AMARYL) 4 MG tablet       . insulin glargine (LANTUS) 100 UNIT/ML injection Inject into the skin at bedtime. 37 units, sliding scale      . lidocaine-prilocaine (EMLA) cream Apply 1 application topically as needed.  30 g  2  . magnesium hydroxide (MILK OF MAGNESIA) 400 MG/5ML suspension Take by mouth daily.        . mirtazapine (REMERON) 30 MG tablet Take 1 tablet (30 mg total) by mouth at bedtime.  90 tablet  0  . Multiple Vitamins-Minerals (CENTRUM PO) Take 1 tablet by mouth daily.       . ondansetron (ZOFRAN) 8 MG tablet Take 1 tablet (8 mg total) by mouth every 12 (twelve) hours as needed for nausea.  20 tablet  2  . ondansetron (ZOFRAN-ODT) 8 MG disintegrating tablet       . oxyCODONE (OXY IR/ROXICODONE) 5 MG immediate release tablet Take 1 tablet (5 mg total)  by mouth every 4 (four) hours as needed.  30 tablet  0  . oxyCODONE (OXYCONTIN) 10 MG 12 hr tablet Take 1 tablet (10 mg total) by mouth every 12 (twelve) hours.  60 tablet  0  . pravastatin (PRAVACHOL) 20 MG tablet Take 20 mg by mouth at bedtime.       . ranitidine (ZANTAC) 150 MG tablet Take 150 mg by mouth 2 (two) times daily.       . Tamsulosin HCl (FLOMAX) 0.4 MG CAPS       . terazosin (HYTRIN) 2 MG capsule Take 2 mg by mouth 2 (two) times daily.       Marland Kitchen DISCONTD: metFORMIN (GLUCOPHAGE) 850 MG tablet Take 850 mg by mouth 2 (two) times daily with a meal.        No current facility-administered  medications for this visit.   Facility-Administered Medications Ordered in Other Visits  Medication Dose Route Frequency Provider Last Rate Last Dose  . dexamethasone (DECADRON) injection 10 mg  10 mg Intravenous Once Si Gaul, MD   10 mg at 03/08/12 1234  . fluorouracil (ADRUCIL) 4,450 mg in sodium chloride 0.9 % 150 mL chemo infusion  2,400 mg/m2 (Treatment Plan Actual) Intravenous 1 day or 1 dose Si Gaul, MD   4,450 mg at 03/08/12 1554  . fluorouracil (ADRUCIL) chemo injection 750 mg  400 mg/m2 (Treatment Plan Actual) Intravenous Once Si Gaul, MD   750 mg at 03/08/12 1547  . leucovorin 744 mg in dextrose 5 % 250 mL infusion  400 mg/m2 (Treatment Plan Actual) Intravenous Once Si Gaul, MD   744 mg at 03/08/12 1328  . ondansetron (ZOFRAN) IVPB 8 mg  8 mg Intravenous Once Si Gaul, MD   8 mg at 03/08/12 1234    SURGICAL HISTORY:  Past Surgical History  Procedure Date  . Appendectomy 31-20 years old  . Inguinal hernia repair as infant, age 87    LIH- infant, RIH age 82  . Pancreatectomy 06/2010    with splenectomy    REVIEW OF SYSTEMS:  A comprehensive review of systems was negative except for: Neurological: positive for paresthesia   PHYSICAL EXAMINATION: General appearance: alert, cooperative and no distress Head: Normocephalic, without obvious abnormality, atraumatic Neck: no adenopathy Lymph nodes: Cervical, supraclavicular, and axillary nodes normal. Resp: clear to auscultation bilaterally Cardio: regular rate and rhythm, S1, S2 normal, no murmur, click, rub or gallop GI: soft, non-tender; bowel sounds normal; no masses,  no organomegaly Extremities: extremities normal, atraumatic, no cyanosis or edema Neurologic: Alert and oriented X 3, normal strength and tone. Normal symmetric reflexes. Normal coordination and gait  ECOG PERFORMANCE STATUS: 1 - Symptomatic but completely ambulatory  Blood pressure 122/77, pulse 90, temperature 97.5 F (36.4  C), temperature source Oral, height 5\' 7"  (1.702 m), weight 154 lb 1.6 oz (69.899 kg).  LABORATORY DATA: Lab Results  Component Value Date   WBC 4.2 03/08/2012   HGB 10.2* 03/08/2012   HCT 30.2* 03/08/2012   MCV 101.7* 03/08/2012   PLT 227 03/08/2012      Chemistry      Component Value Date/Time   NA 137 03/08/2012 1041   K 4.5 03/08/2012 1041   CL 103 03/08/2012 1041   CO2 31 03/08/2012 1041   BUN 20 03/08/2012 1041   CREATININE 0.79 03/08/2012 1041      Component Value Date/Time   CALCIUM 8.6 03/08/2012 1041   ALKPHOS 192* 03/08/2012 1041   AST 49* 03/08/2012 1041   ALT  42 03/08/2012 1041   BILITOT 0.8 03/08/2012 1041       RADIOGRAPHIC STUDIES: No results found.  ASSESSMENT/PLAN: This is a very pleasant 77 years old white male with metastatic pancreatic adenocarcinoma currently on treatment with 5-FU and leucovorin, status post 2 cycles. The patient is tolerating his treatment fairly well. Patient was discussed with Dr. Arbutus Ped. He will proceed with cycle #3 of his systemic chemotherapy with 5-FU and leucovorin. He'll return in 2 weeks with repeat CBC differential, C. met and magnesium. A prescription for his Zofran 8 mg tablets, will one by mouth every 12 hours as needed for nausea a total of 20 with 2 refills was sent to his pharmacy of record via E. scribed. He was also given a prescription for OxyContin 10 mg tablets one by mouth every 12 hours for pain a total of 60 with no refill   Laural Benes, Abbie Jablon E, PA-C    All questions were answered. The patient knows to call the clinic with any problems, questions or concerns. We can certainly see the patient much sooner if necessary.  I spent 15 minutes counseling the patient face to face. The total time spent in the appointment was 25 minutes.

## 2012-03-08 NOTE — Patient Instructions (Signed)
[facility] Discharge Instructions for Patients Receiving Chemotherapy  Today you received the following chemotherapy agents Leucovorin and Adrucil.  To help prevent nausea and vomiting after your treatment, we encourage you to take your nausea medication. Begin taking it as often as prescribed for by your physician.    If you develop nausea and vomiting that is not controlled by your nausea medication, call the clinic. If it is after clinic hours your family physician or the after hours number for the clinic or go to the Emergency Department.   BELOW ARE SYMPTOMS THAT SHOULD BE REPORTED IMMEDIATELY:  *FEVER GREATER THAN 100.5 F  *CHILLS WITH OR WITHOUT FEVER  NAUSEA AND VOMITING THAT IS NOT CONTROLLED WITH YOUR NAUSEA MEDICATION  *UNUSUAL SHORTNESS OF BREATH  *UNUSUAL BRUISING OR BLEEDING  TENDERNESS IN MOUTH AND THROAT WITH OR WITHOUT PRESENCE OF ULCERS  *URINARY PROBLEMS  *BOWEL PROBLEMS  UNUSUAL RASH Items with * indicate a potential emergency and should be followed up as soon as possible.  One of the nurses will contact you 24 hours after your treatment. Please let the nurse know about any problems that you may have experienced. Feel free to call the clinic you have any questions or concerns. The clinic phone number is [phone].   I have been informed and understand all the instructions given to me. I know to contact the clinic, my physician, or go to the Emergency Department if any problems should occur. I do not have any questions at this time, but understand that I may call the clinic during office hours   should I have any questions or need assistance in obtaining follow up care.    __________________________________________  _____________  __________ Signature of Patient or Authorized Representative            Date                   Time    __________________________________________ Nurse's Signature

## 2012-03-10 ENCOUNTER — Ambulatory Visit (HOSPITAL_BASED_OUTPATIENT_CLINIC_OR_DEPARTMENT_OTHER): Payer: Medicare Other

## 2012-03-10 VITALS — BP 111/65 | HR 78 | Temp 96.7°F

## 2012-03-10 DIAGNOSIS — C787 Secondary malignant neoplasm of liver and intrahepatic bile duct: Secondary | ICD-10-CM

## 2012-03-10 DIAGNOSIS — C259 Malignant neoplasm of pancreas, unspecified: Secondary | ICD-10-CM

## 2012-03-10 DIAGNOSIS — C252 Malignant neoplasm of tail of pancreas: Secondary | ICD-10-CM

## 2012-03-10 MED ORDER — SODIUM CHLORIDE 0.9 % IJ SOLN
10.0000 mL | INTRAMUSCULAR | Status: DC | PRN
  Administered 2012-03-10: 10 mL
  Filled 2012-03-10: qty 10

## 2012-03-10 MED ORDER — HEPARIN SOD (PORK) LOCK FLUSH 100 UNIT/ML IV SOLN
500.0000 [IU] | Freq: Once | INTRAVENOUS | Status: AC | PRN
  Administered 2012-03-10: 500 [IU]
  Filled 2012-03-10: qty 5

## 2012-03-10 NOTE — Patient Instructions (Signed)
Call MD for problems 

## 2012-03-22 ENCOUNTER — Encounter: Payer: Self-pay | Admitting: Physician Assistant

## 2012-03-22 ENCOUNTER — Telehealth: Payer: Self-pay | Admitting: Internal Medicine

## 2012-03-22 ENCOUNTER — Other Ambulatory Visit (HOSPITAL_BASED_OUTPATIENT_CLINIC_OR_DEPARTMENT_OTHER): Payer: Medicare Other | Admitting: Lab

## 2012-03-22 ENCOUNTER — Ambulatory Visit (HOSPITAL_BASED_OUTPATIENT_CLINIC_OR_DEPARTMENT_OTHER): Payer: Medicare Other

## 2012-03-22 ENCOUNTER — Ambulatory Visit (HOSPITAL_BASED_OUTPATIENT_CLINIC_OR_DEPARTMENT_OTHER): Payer: Medicare Other | Admitting: Physician Assistant

## 2012-03-22 VITALS — BP 131/80 | HR 75 | Temp 97.5°F | Ht 67.0 in | Wt 152.6 lb

## 2012-03-22 DIAGNOSIS — Z5111 Encounter for antineoplastic chemotherapy: Secondary | ICD-10-CM

## 2012-03-22 DIAGNOSIS — C259 Malignant neoplasm of pancreas, unspecified: Secondary | ICD-10-CM

## 2012-03-22 DIAGNOSIS — C50919 Malignant neoplasm of unspecified site of unspecified female breast: Secondary | ICD-10-CM

## 2012-03-22 DIAGNOSIS — C787 Secondary malignant neoplasm of liver and intrahepatic bile duct: Secondary | ICD-10-CM

## 2012-03-22 DIAGNOSIS — C252 Malignant neoplasm of tail of pancreas: Secondary | ICD-10-CM

## 2012-03-22 LAB — COMPREHENSIVE METABOLIC PANEL
BUN: 21 mg/dL (ref 6–23)
CO2: 28 mEq/L (ref 19–32)
Calcium: 8.8 mg/dL (ref 8.4–10.5)
Chloride: 101 mEq/L (ref 96–112)
Creatinine, Ser: 0.79 mg/dL (ref 0.50–1.35)

## 2012-03-22 LAB — CBC WITH DIFFERENTIAL/PLATELET
Eosinophils Absolute: 0.2 10*3/uL (ref 0.0–0.5)
HCT: 28.8 % — ABNORMAL LOW (ref 38.4–49.9)
LYMPH%: 14.6 % (ref 14.0–49.0)
MCHC: 34 g/dL (ref 32.0–36.0)
MONO#: 1.4 10*3/uL — ABNORMAL HIGH (ref 0.1–0.9)
NEUT#: 3.4 10*3/uL (ref 1.5–6.5)
NEUT%: 57.5 % (ref 39.0–75.0)
Platelets: 233 10*3/uL (ref 140–400)
WBC: 5.9 10*3/uL (ref 4.0–10.3)

## 2012-03-22 MED ORDER — ONDANSETRON 8 MG/50ML IVPB (CHCC)
8.0000 mg | Freq: Once | INTRAVENOUS | Status: AC
  Administered 2012-03-22: 8 mg via INTRAVENOUS

## 2012-03-22 MED ORDER — DEXTROSE 5 % IV SOLN
Freq: Once | INTRAVENOUS | Status: AC
  Administered 2012-03-22: 50 mL via INTRAVENOUS

## 2012-03-22 MED ORDER — DEXAMETHASONE SODIUM PHOSPHATE 10 MG/ML IJ SOLN
10.0000 mg | Freq: Once | INTRAMUSCULAR | Status: AC
  Administered 2012-03-22: 10 mg via INTRAVENOUS

## 2012-03-22 MED ORDER — ONDANSETRON HCL 8 MG PO TABS: 8.0000 mg | ORAL_TABLET | Freq: Two times a day (BID) | ORAL | Status: DC | PRN

## 2012-03-22 MED ORDER — LEUCOVORIN CALCIUM INJECTION 350 MG
400.0000 mg/m2 | Freq: Once | INTRAVENOUS | Status: AC
  Administered 2012-03-22: 744 mg via INTRAVENOUS
  Filled 2012-03-22: qty 37.2

## 2012-03-22 MED ORDER — SODIUM CHLORIDE 0.9 % IV SOLN
2400.0000 mg/m2 | INTRAVENOUS | Status: DC
  Administered 2012-03-22: 4450 mg via INTRAVENOUS
  Filled 2012-03-22: qty 89

## 2012-03-22 MED ORDER — FLUOROURACIL CHEMO INJECTION 2.5 GM/50ML
400.0000 mg/m2 | Freq: Once | INTRAVENOUS | Status: AC
  Administered 2012-03-22: 750 mg via INTRAVENOUS
  Filled 2012-03-22: qty 15

## 2012-03-22 NOTE — Patient Instructions (Addendum)
 Cancer Center Discharge Instructions for Patients Receiving Chemotherapy  Today you received the following chemotherapy agents 52fu/ Leucovorin To help prevent nausea and vomiting after your treatment, we encourage you to take your nausea medication as per Dr. Arbutus Ped.  If you develop nausea and vomiting that is not controlled by your nausea medication, call the clinic. If it is after clinic hours your family physician or the after hours number for the clinic or go to the Emergency Department.   BELOW ARE SYMPTOMS THAT SHOULD BE REPORTED IMMEDIATELY:  *FEVER GREATER THAN 100.5 F  *CHILLS WITH OR WITHOUT FEVER  NAUSEA AND VOMITING THAT IS NOT CONTROLLED WITH YOUR NAUSEA MEDICATION  *UNUSUAL SHORTNESS OF BREATH  *UNUSUAL BRUISING OR BLEEDING  TENDERNESS IN MOUTH AND THROAT WITH OR WITHOUT PRESENCE OF ULCERS  *URINARY PROBLEMS  *BOWEL PROBLEMS  UNUSUAL RASH Items with * indicate a potential emergency and should be followed up as soon as possible.   Feel free to call the clinic you have any questions or concerns. The clinic phone number is [phone].   I have been informed and understand all the instructions given to me. I know to contact the clinic, my physician, or go to the Emergency Department if any problems should occur. I do not have any questions at this time, but understand that I may call the clinic during office hours   should I have any questions or need assistance in obtaining follow up care.    __________________________________________  _____________  __________ Signature of Patient or Authorized Representative            Date                   Time    __________________________________________ Nurse's Signature

## 2012-03-22 NOTE — Telephone Encounter (Signed)
appts made and per dr mkm can do week prior to chemo  8/12

## 2012-03-22 NOTE — Progress Notes (Signed)
[facility] Telephone:(336) [phone]   Fax:(336) [phone]  OFFICE PROGRESS NOTE  [name], MD Dr. Sharman Crate [place] [address] [place] [address]  DIAGNOSIS: Metastatic pancreatic adenocarcinoma, now with metastatic disease to the liver and the abdominal wall, initially diagnosed with stage IIIB(T3, N1, M0) pancreatic tail adenocarcinoma in November of 2011.   PRIOR THERAPY:  #1 distal pancreatectomy and splenectomy on 06/25/2010  #2 adjuvant chemotherapy with gemcitabine started 08/20/2010 and completed 2 cycles on 12/02/2010.  #3 status post concurrent chemoradiation with Xeloda from 12/15/2010 on 10 01/24/2011.  #4 status post 2 more cycles of adjuvant gemcitabine with gemcitabine completed on 04/04/2011.  #5 FOLFOX giving every 2 weeks status post 12 cycles.   CURRENT THERAPY: Continuous infusion 5-FU and leucovorin every 2 weeks. Status post 3 cycles.   INTERVAL HISTORY: ELISEO WITHERS 77 y.o. male returns to the clinic today for followup visit accompanied by his wife. The patient overall is tolerating his treatment with 5-FU and leucovorin fairly well. He still complaining of numbness and tingling is in his fingers and toes. He does find it harder to button his close but states that he can still write his name. 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 He is not having any stumbling or falling. He does have problems with his right knee in that it "gives away". He is planning to have some surgery to correct this in the future. He denied having any significant nausea or vomiting and no significant abdominal pain. He requests a referral for his Zofran and a larger amount as he averages 2 tablets daily to control his nausea. He denied having any change in his bowel movement. No significant chest pain or shortness of breath.   MEDICAL HISTORY: Past Medical History  Diagnosis Date  . Heart murmur   . Abdominal pain,  periumbilical   . Constipation   . pancreatic ca dx'd 04/2010  . Diabetes mellitus   . Hypertension     ALLERGIES:   has no known allergies.  MEDICATIONS:  Current Outpatient Prescriptions  Medication Sig Dispense Refill  . Alum & Mag Hydroxide-Simeth (MAGIC MOUTHWASH) SOLN Take 5 mLs by mouth QID.  240 mL  0  . aspirin 81 MG tablet Take 81 mg by mouth every morning.       Tery Sanfilippo Sodium (COLACE PO) Take 2 tablets by mouth 2 (two) times daily.        Marland Kitchen glimepiride (AMARYL) 4 MG tablet       . insulin glargine (LANTUS) 100 UNIT/ML injection Inject into the skin at bedtime. 37 units, sliding scale      . lidocaine-prilocaine (EMLA) cream Apply 1 application topically as needed.  30 g  2  . magnesium hydroxide (MILK OF MAGNESIA) 400 MG/5ML suspension Take by mouth daily.        . mirtazapine (REMERON) 30 MG tablet Take 1 tablet (30 mg total) by mouth at bedtime.  90 tablet  0  . Multiple Vitamins-Minerals (CENTRUM PO) Take 1 tablet by mouth daily.       . ondansetron (ZOFRAN) 8 MG tablet Take 1 tablet (8 mg total) by mouth every 12 (twelve) hours as needed for nausea.  60 tablet  2  . ondansetron (ZOFRAN-ODT) 8 MG disintegrating tablet       . oxyCODONE (OXY IR/ROXICODONE) 5 MG immediate release tablet Take 1 tablet (5 mg total) by mouth every  4 (four) hours as needed.  30 tablet  0  . oxyCODONE (OXYCONTIN) 10 MG 12 hr tablet Take 1 tablet (10 mg total) by mouth every 12 (twelve) hours.  60 tablet  0  . pravastatin (PRAVACHOL) 20 MG tablet Take 20 mg by mouth at bedtime.       . ranitidine (ZANTAC) 150 MG tablet Take 150 mg by mouth 2 (two) times daily.       . Tamsulosin HCl (FLOMAX) 0.4 MG CAPS       . terazosin (HYTRIN) 2 MG capsule Take 2 mg by mouth 2 (two) times daily.       Marland Kitchen DISCONTD: metFORMIN (GLUCOPHAGE) 850 MG tablet Take 850 mg by mouth 2 (two) times daily with a meal.        No current facility-administered medications for this visit.   Facility-Administered Medications  Ordered in Other Visits  Medication Dose Route Frequency Provider Last Rate Last Dose  . dexamethasone (DECADRON) injection 10 mg  10 mg Intravenous Once Si Gaul, MD   10 mg at 03/22/12 1058  . dextrose 5 % solution   Intravenous Once Si Gaul, MD   50 mL at 03/22/12 1045  . fluorouracil (ADRUCIL) 4,450 mg in sodium chloride 0.9 % 150 mL chemo infusion  2,400 mg/m2 (Treatment Plan Actual) Intravenous 1 day or 1 dose Si Gaul, MD   4,450 mg at 03/22/12 1334  . fluorouracil (ADRUCIL) chemo injection 750 mg  400 mg/m2 (Treatment Plan Actual) Intravenous Once Si Gaul, MD   750 mg at 03/22/12 0133  . leucovorin 744 mg in dextrose 5 % 250 mL infusion  400 mg/m2 (Treatment Plan Actual) Intravenous Once Si Gaul, MD   744 mg at 03/22/12 1118  . ondansetron (ZOFRAN) IVPB 8 mg  8 mg Intravenous Once Si Gaul, MD   8 mg at 03/22/12 1058    SURGICAL HISTORY:  Past Surgical History  Procedure Date  . Appendectomy 54-37 years old  . Inguinal hernia repair as infant, age 50    LIH- infant, RIH age 48  . Pancreatectomy 06/2010    with splenectomy    REVIEW OF SYSTEMS:  A comprehensive review of systems was negative except for: Gastrointestinal: positive for nausea Neurological: positive for paresthesia   PHYSICAL EXAMINATION: General appearance: alert, cooperative and no distress Head: Normocephalic, without obvious abnormality, atraumatic Neck: no adenopathy Lymph nodes: Cervical, supraclavicular, and axillary nodes normal. Resp: clear to auscultation bilaterally Cardio: regular rate and rhythm, S1, S2 normal, no murmur, click, rub or gallop GI: soft, non-tender; bowel sounds normal; no masses,  no organomegaly Extremities: extremities normal, atraumatic, no cyanosis or edema Neurologic: Alert and oriented X 3, normal strength and tone. Normal symmetric reflexes. Normal coordination and gait  ECOG PERFORMANCE STATUS: 1 - Symptomatic but completely  ambulatory  Blood pressure 131/80, pulse 75, temperature 97.5 F (36.4 C), temperature source Oral, height 5\' 7"  (1.702 m), weight 152 lb 9.6 oz (69.219 kg).  LABORATORY DATA: Lab Results  Component Value Date   WBC 5.9 03/22/2012   HGB 9.8* 03/22/2012   HCT 28.8* 03/22/2012   MCV 101.1* 03/22/2012   PLT 233 03/22/2012      Chemistry      Component Value Date/Time   NA 135 03/22/2012 0939   K 4.5 03/22/2012 0939   CL 101 03/22/2012 0939   CO2 28 03/22/2012 0939   BUN 21 03/22/2012 0939   CREATININE 0.79 03/22/2012 0939      Component Value Date/Time  CALCIUM 8.8 03/22/2012 0939   ALKPHOS 240* 03/22/2012 0939   AST 63* 03/22/2012 0939   ALT 52 03/22/2012 0939   BILITOT 0.9 03/22/2012 0939       RADIOGRAPHIC STUDIES: No results found.  ASSESSMENT/PLAN: This is a very pleasant 77 years old white male with metastatic pancreatic adenocarcinoma currently on treatment with 5-FU and leucovorin, status post 2 cycles. The patient is tolerating his treatment fairly well. Patient was discussed with Dr. Arbutus Ped. He will proceed with cycle #3 of his systemic chemotherapy with 5-FU and leucovorin. He'll return in 2 weeks with repeat CBC differential, C. met and magnesium. A prescription for his Zofran 8 mg tablets, will one by mouth every 12 hours as needed for nausea a total of 60 with 2 refills was sent to his pharmacy of record via E. Scribe. We will plan to do a restaging CT scan of the chest abdomen and pelvis after he has completed 6 cycles of chemotherapy with 5-FU and leucovorin.    Laural Benes, Taetum Flewellen E, PA-C    All questions were answered. The patient knows to call the clinic with any problems, questions or concerns. We can certainly see the patient much sooner if necessary.  I spent 20 minutes counseling the patient face to face. The total time spent in the appointment was 30 minutes.

## 2012-03-24 ENCOUNTER — Ambulatory Visit (HOSPITAL_BASED_OUTPATIENT_CLINIC_OR_DEPARTMENT_OTHER): Payer: Medicare Other

## 2012-03-24 ENCOUNTER — Encounter: Payer: Self-pay | Admitting: Internal Medicine

## 2012-03-24 VITALS — BP 123/67 | HR 84 | Temp 97.5°F

## 2012-03-24 DIAGNOSIS — C259 Malignant neoplasm of pancreas, unspecified: Secondary | ICD-10-CM

## 2012-03-24 DIAGNOSIS — C252 Malignant neoplasm of tail of pancreas: Secondary | ICD-10-CM

## 2012-03-24 DIAGNOSIS — C787 Secondary malignant neoplasm of liver and intrahepatic bile duct: Secondary | ICD-10-CM

## 2012-03-24 DIAGNOSIS — Z452 Encounter for adjustment and management of vascular access device: Secondary | ICD-10-CM

## 2012-03-24 MED ORDER — SODIUM CHLORIDE 0.9 % IJ SOLN
10.0000 mL | INTRAMUSCULAR | Status: DC | PRN
  Administered 2012-03-24: 10 mL
  Filled 2012-03-24: qty 10

## 2012-03-24 MED ORDER — HEPARIN SOD (PORK) LOCK FLUSH 100 UNIT/ML IV SOLN
500.0000 [IU] | Freq: Once | INTRAVENOUS | Status: AC | PRN
  Administered 2012-03-24: 500 [IU]
  Filled 2012-03-24: qty 5

## 2012-03-24 NOTE — Progress Notes (Signed)
Patient came by office today concerning bills I called the billing office for patient, patient will need to call insurances company that he had at the time of visits and have them make corrections on the fact that he did have the insurance at the time of the visit. Patient will contact our billing office once he has talked to the the insurance company and they will re file the charges.

## 2012-03-24 NOTE — Patient Instructions (Signed)
Call MD for problems 

## 2012-03-29 ENCOUNTER — Other Ambulatory Visit: Payer: Self-pay | Admitting: Orthopedic Surgery

## 2012-03-29 MED ORDER — DEXAMETHASONE SODIUM PHOSPHATE 10 MG/ML IJ SOLN: 10.0000 mg | Freq: Once | INTRAMUSCULAR | Status: DC

## 2012-03-29 NOTE — Progress Notes (Signed)
Preoperative surgical orders have been place into the Epic hospital system for [name] on 03/29/2012, 10:12 PM  by Patrica Duel for surgery on 04/23/2012.  Preop Knee Scope orders including IV Tylenol and IV Decadron as long as there are no contraindications to the above medications. Avel Peace, PA-C

## 2012-03-30 ENCOUNTER — Other Ambulatory Visit: Payer: Self-pay | Admitting: Medical Oncology

## 2012-03-30 DIAGNOSIS — C259 Malignant neoplasm of pancreas, unspecified: Secondary | ICD-10-CM

## 2012-03-30 MED ORDER — MIRTAZAPINE 30 MG PO TABS: 30.0000 mg | ORAL_TABLET | Freq: Every day | ORAL | Status: DC

## 2012-03-30 NOTE — Telephone Encounter (Signed)
Pt requests refill and called to [facility] and pt

## 2012-03-31 ENCOUNTER — Telehealth: Payer: Self-pay | Admitting: Internal Medicine

## 2012-03-31 NOTE — Telephone Encounter (Signed)
pt cannot come in on 8/8 and per lt she can see the pt for aj on 8/9,pt is aware   aom

## 2012-04-01 ENCOUNTER — Ambulatory Visit: Payer: Medicare Other | Admitting: Physician Assistant

## 2012-04-01 ENCOUNTER — Encounter: Payer: Self-pay | Admitting: *Deleted

## 2012-04-02 ENCOUNTER — Telehealth: Payer: Self-pay | Admitting: Medical Oncology

## 2012-04-02 ENCOUNTER — Ambulatory Visit (HOSPITAL_BASED_OUTPATIENT_CLINIC_OR_DEPARTMENT_OTHER): Payer: Medicare Other | Admitting: Nurse Practitioner

## 2012-04-02 VITALS — BP 125/68 | HR 83 | Temp 97.0°F | Resp 20 | Ht 67.0 in | Wt 150.8 lb

## 2012-04-02 DIAGNOSIS — C252 Malignant neoplasm of tail of pancreas: Secondary | ICD-10-CM

## 2012-04-02 DIAGNOSIS — C787 Secondary malignant neoplasm of liver and intrahepatic bile duct: Secondary | ICD-10-CM

## 2012-04-02 DIAGNOSIS — G589 Mononeuropathy, unspecified: Secondary | ICD-10-CM

## 2012-04-02 DIAGNOSIS — C259 Malignant neoplasm of pancreas, unspecified: Secondary | ICD-10-CM

## 2012-04-02 NOTE — Progress Notes (Signed)
OFFICE PROGRESS NOTE  Interval history:  Mr. Leonetti is a 77 year old man with metastatic pancreatic cancer. He is on active treatment with 5-FU/leucovorin as per the FOLFOX regimen. He is seen today for scheduled followup.  Overall he is feeling well. He denies nausea/vomiting. He had a few minor mouth sores following the most recent cycle of chemotherapy. He uses Magic mouthwash as needed. He denies diarrhea. No hand or foot pain or redness. He has stable numbness in the hands and feet. He denies abdominal pain.   Objective: Blood pressure 125/68, pulse 83, temperature 97 F (36.1 C), temperature source Oral, resp. rate 20, height 5\' 7"  (1.702 m), weight 150 lb 12.8 oz (68.402 kg).  Oropharynx is without thrush or ulceration. No palpable cervical, supraclavicular or axillary lymph nodes. Lungs are clear. No wheezes or rales. Regular cardiac rhythm. Port-A-Cath site is without erythema. Abdomen is soft and nontender. No hepatomegaly. Extremities without edema.  Lab Results: Lab Results  Component Value Date   WBC 5.9 03/22/2012   HGB 9.8* 03/22/2012   HCT 28.8* 03/22/2012   MCV 101.1* 03/22/2012   PLT 233 03/22/2012    Chemistry:    Chemistry      Component Value Date/Time   NA 135 03/22/2012 0939   K 4.5 03/22/2012 0939   CL 101 03/22/2012 0939   CO2 28 03/22/2012 0939   BUN 21 03/22/2012 0939   CREATININE 0.79 03/22/2012 0939      Component Value Date/Time   CALCIUM 8.8 03/22/2012 0939   ALKPHOS 240* 03/22/2012 0939   AST 63* 03/22/2012 0939   ALT 52 03/22/2012 0939   BILITOT 0.9 03/22/2012 0939       Studies/Results: No results found.  Medications: I have reviewed the patient's current medications.  Assessment/Plan:  1. Metastatic pancreatic cancer currently on active treatment with 5-FU/leucovorin as per the FOLFOX regimen. 2. 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  Disposition-Mr. Nier appears stable. He is tolerating the chemotherapy well. Plan to proceed with treatment  as scheduled 04/05/2012. He will have restaging CT scans 04/12/2012 and will return for a followup visit on 04/19/2012. Mr. Lieb will contact the office prior to his next visit with any problems.  Lonna Cobb ANP/GNP-BC

## 2012-04-02 NOTE — Telephone Encounter (Signed)
Nothing new the schedule as of 04-02-2012

## 2012-04-05 ENCOUNTER — Telehealth: Payer: Self-pay | Admitting: Internal Medicine

## 2012-04-05 ENCOUNTER — Other Ambulatory Visit (HOSPITAL_BASED_OUTPATIENT_CLINIC_OR_DEPARTMENT_OTHER): Payer: Medicare Other | Admitting: Lab

## 2012-04-05 ENCOUNTER — Other Ambulatory Visit: Payer: Self-pay | Admitting: Nurse Practitioner

## 2012-04-05 ENCOUNTER — Ambulatory Visit (HOSPITAL_BASED_OUTPATIENT_CLINIC_OR_DEPARTMENT_OTHER): Payer: Medicare Other

## 2012-04-05 DIAGNOSIS — C50919 Malignant neoplasm of unspecified site of unspecified female breast: Secondary | ICD-10-CM

## 2012-04-05 DIAGNOSIS — C787 Secondary malignant neoplasm of liver and intrahepatic bile duct: Secondary | ICD-10-CM

## 2012-04-05 DIAGNOSIS — Z5111 Encounter for antineoplastic chemotherapy: Secondary | ICD-10-CM

## 2012-04-05 DIAGNOSIS — C259 Malignant neoplasm of pancreas, unspecified: Secondary | ICD-10-CM

## 2012-04-05 DIAGNOSIS — C779 Secondary and unspecified malignant neoplasm of lymph node, unspecified: Secondary | ICD-10-CM

## 2012-04-05 DIAGNOSIS — C252 Malignant neoplasm of tail of pancreas: Secondary | ICD-10-CM

## 2012-04-05 LAB — COMPREHENSIVE METABOLIC PANEL
AST: 43 U/L — ABNORMAL HIGH (ref 0–37)
Alkaline Phosphatase: 187 U/L — ABNORMAL HIGH (ref 39–117)
BUN: 24 mg/dL — ABNORMAL HIGH (ref 6–23)
Creatinine, Ser: 0.79 mg/dL (ref 0.50–1.35)
Potassium: 4.6 mEq/L (ref 3.5–5.3)
Total Bilirubin: 0.8 mg/dL (ref 0.3–1.2)

## 2012-04-05 LAB — CBC WITH DIFFERENTIAL/PLATELET
BASO%: 1 % (ref 0.0–2.0)
EOS%: 11.1 % — ABNORMAL HIGH (ref 0.0–7.0)
HCT: 28.7 % — ABNORMAL LOW (ref 38.4–49.9)
MCH: 34 pg — ABNORMAL HIGH (ref 27.2–33.4)
MCHC: 33.8 g/dL (ref 32.0–36.0)
MONO%: 25.5 % — ABNORMAL HIGH (ref 0.0–14.0)
NEUT%: 41.3 % (ref 39.0–75.0)
lymph#: 1.3 10*3/uL (ref 0.9–3.3)

## 2012-04-05 MED ORDER — DEXTROSE 5 % IV SOLN
Freq: Once | INTRAVENOUS | Status: AC
  Administered 2012-04-05: 13:00:00 via INTRAVENOUS

## 2012-04-05 MED ORDER — HEPARIN SOD (PORK) LOCK FLUSH 100 UNIT/ML IV SOLN
500.0000 [IU] | Freq: Once | INTRAVENOUS | Status: DC | PRN
  Filled 2012-04-05: qty 5

## 2012-04-05 MED ORDER — SODIUM CHLORIDE 0.9 % IV SOLN
2400.0000 mg/m2 | INTRAVENOUS | Status: DC
  Administered 2012-04-05: 4450 mg via INTRAVENOUS
  Filled 2012-04-05: qty 89

## 2012-04-05 MED ORDER — ONDANSETRON 8 MG/50ML IVPB (CHCC)
8.0000 mg | Freq: Once | INTRAVENOUS | Status: AC
  Administered 2012-04-05: 8 mg via INTRAVENOUS

## 2012-04-05 MED ORDER — LEUCOVORIN CALCIUM INJECTION 350 MG
400.0000 mg/m2 | Freq: Once | INTRAVENOUS | Status: AC
  Administered 2012-04-05: 744 mg via INTRAVENOUS
  Filled 2012-04-05: qty 37.2

## 2012-04-05 MED ORDER — DEXAMETHASONE SODIUM PHOSPHATE 10 MG/ML IJ SOLN
10.0000 mg | Freq: Once | INTRAMUSCULAR | Status: AC
  Administered 2012-04-05: 10 mg via INTRAVENOUS

## 2012-04-05 MED ORDER — FLUOROURACIL CHEMO INJECTION 2.5 GM/50ML
400.0000 mg/m2 | Freq: Once | INTRAVENOUS | Status: AC
  Administered 2012-04-05: 750 mg via INTRAVENOUS
  Filled 2012-04-05: qty 15

## 2012-04-05 MED ORDER — SODIUM CHLORIDE 0.9 % IJ SOLN
10.0000 mL | INTRAMUSCULAR | Status: DC | PRN
  Filled 2012-04-05: qty 10

## 2012-04-05 NOTE — Telephone Encounter (Signed)
Faxed records to [place].

## 2012-04-05 NOTE — Telephone Encounter (Signed)
Gave pt appt date for CT on 8/19, gave pt oral contrast , NPO 4 hours prior to CT. Pt will be called for MD visit appt per Sedalia Muta ,RN

## 2012-04-05 NOTE — Patient Instructions (Addendum)
[facility] Discharge Instructions for Patients Receiving Chemotherapy  Today you received the following chemotherapy agents folfox  To help prevent nausea and vomiting after your treatment, we encourage you to take your nausea medicatio and take it as often as prescribed   If you develop nausea and vomiting that is not controlled by your nausea medication, call the clinic. If it is after clinic hours your family physician or the after hours number for the clinic or go to the Emergency Department.   BELOW ARE SYMPTOMS THAT SHOULD BE REPORTED IMMEDIATELY:  *FEVER GREATER THAN 100.5 F  *CHILLS WITH OR WITHOUT FEVER  NAUSEA AND VOMITING THAT IS NOT CONTROLLED WITH YOUR NAUSEA MEDICATION  *UNUSUAL SHORTNESS OF BREATH  *UNUSUAL BRUISING OR BLEEDING  TENDERNESS IN MOUTH AND THROAT WITH OR WITHOUT PRESENCE OF ULCERS  *URINARY PROBLEMS  *BOWEL PROBLEMS  UNUSUAL RASH Items with * indicate a potential emergency and should be followed up as soon as possible.  One of the nurses will contact you 24 hours after your treatment. Please let the nurse know about any problems that you may have experienced. Feel free to call the clinic you have any questions or concerns. The clinic phone number is [phone].   I have been informed and understand all the instructions given to me. I know to contact the clinic, my physician, or go to the Emergency Department if any problems should occur. I do not have any questions at this time, but understand that I may call the clinic during office hours   should I have any questions or need assistance in obtaining follow up care.    __________________________________________  _____________  __________ Signature of Patient or Authorized Representative            Date                   Time    __________________________________________ Nurse's Signature

## 2012-04-07 ENCOUNTER — Ambulatory Visit (HOSPITAL_BASED_OUTPATIENT_CLINIC_OR_DEPARTMENT_OTHER): Payer: Medicare Other

## 2012-04-07 DIAGNOSIS — C259 Malignant neoplasm of pancreas, unspecified: Secondary | ICD-10-CM

## 2012-04-07 DIAGNOSIS — C252 Malignant neoplasm of tail of pancreas: Secondary | ICD-10-CM

## 2012-04-07 DIAGNOSIS — C787 Secondary malignant neoplasm of liver and intrahepatic bile duct: Secondary | ICD-10-CM

## 2012-04-07 MED ORDER — HEPARIN SOD (PORK) LOCK FLUSH 100 UNIT/ML IV SOLN
500.0000 [IU] | Freq: Once | INTRAVENOUS | Status: AC | PRN
  Administered 2012-04-07: 500 [IU]
  Filled 2012-04-07: qty 5

## 2012-04-07 MED ORDER — SODIUM CHLORIDE 0.9 % IJ SOLN
10.0000 mL | INTRAMUSCULAR | Status: DC | PRN
  Administered 2012-04-07: 10 mL
  Filled 2012-04-07: qty 10

## 2012-04-12 ENCOUNTER — Telehealth: Payer: Self-pay | Admitting: Medical Oncology

## 2012-04-12 ENCOUNTER — Encounter: Payer: Self-pay | Admitting: Internal Medicine

## 2012-04-12 ENCOUNTER — Ambulatory Visit (HOSPITAL_COMMUNITY)
Admission: RE | Admit: 2012-04-12 | Discharge: 2012-04-12 | Disposition: A | Discharge status: Home / Self Care | Payer: Medicare Other | Source: Ambulatory Visit | Attending: Nurse Practitioner | Admitting: Nurse Practitioner

## 2012-04-12 ENCOUNTER — Telehealth: Payer: Self-pay | Admitting: *Deleted

## 2012-04-12 DIAGNOSIS — K669 Disorder of peritoneum, unspecified: Secondary | ICD-10-CM | POA: Insufficient documentation

## 2012-04-12 DIAGNOSIS — N281 Cyst of kidney, acquired: Secondary | ICD-10-CM | POA: Insufficient documentation

## 2012-04-12 DIAGNOSIS — I2510 Atherosclerotic heart disease of native coronary artery without angina pectoris: Secondary | ICD-10-CM | POA: Insufficient documentation

## 2012-04-12 DIAGNOSIS — Z9089 Acquired absence of other organs: Secondary | ICD-10-CM | POA: Insufficient documentation

## 2012-04-12 DIAGNOSIS — N400 Enlarged prostate without lower urinary tract symptoms: Secondary | ICD-10-CM | POA: Insufficient documentation

## 2012-04-12 DIAGNOSIS — N200 Calculus of kidney: Secondary | ICD-10-CM | POA: Insufficient documentation

## 2012-04-12 DIAGNOSIS — M8448XA Pathological fracture, other site, initial encounter for fracture: Secondary | ICD-10-CM | POA: Insufficient documentation

## 2012-04-12 DIAGNOSIS — C787 Secondary malignant neoplasm of liver and intrahepatic bile duct: Secondary | ICD-10-CM | POA: Insufficient documentation

## 2012-04-12 DIAGNOSIS — C259 Malignant neoplasm of pancreas, unspecified: Secondary | ICD-10-CM | POA: Insufficient documentation

## 2012-04-12 MED ORDER — IOHEXOL 300 MG/ML  SOLN
100.0000 mL | Freq: Once | INTRAMUSCULAR | Status: AC | PRN
  Administered 2012-04-12: 100 mL via INTRAVENOUS

## 2012-04-12 NOTE — Telephone Encounter (Signed)
Philip Shaw RECEIVED THE INFORMATION FROM THIS OFFICE. SHE NEEDS TO TALK WITH DR.MOHAMED'S NURSE. THIS NOTE TO DR.MOHAMED'S NURSE, DIANE BELL,RN. NOTIFIED Philip Shaw TO EXPECT A CALL FROM DR.MOHAMED'S NURSE TODAY. SHE VOICES UNDERSTANDING.

## 2012-04-12 NOTE — Telephone Encounter (Signed)
Called teresa and gave her information re treatment plan

## 2012-04-12 NOTE — Progress Notes (Signed)
Patient and his daughter stop by my office this afternoon to talk about a bill he had received in the mail.I did call the the number on the bill and spoke with will and he said that he would take care of re-filing it to the correct insurance carrier which is [employer].

## 2012-04-13 ENCOUNTER — Telehealth: Payer: Self-pay | Admitting: Internal Medicine

## 2012-04-13 ENCOUNTER — Telehealth: Payer: Self-pay | Admitting: Medical Oncology

## 2012-04-13 NOTE — Telephone Encounter (Signed)
lmonvm adviisng the pt of his appts on 04/20/2012@9 :15am

## 2012-04-13 NOTE — Telephone Encounter (Signed)
Va requested last office visit note and request sent to Thelma Barge. Pt received VA approval for continued community care at [facility]  for labs, chemo, transfusions through 2013

## 2012-04-14 ENCOUNTER — Telehealth: Payer: Self-pay | Admitting: Medical Oncology

## 2012-04-14 ENCOUNTER — Encounter (HOSPITAL_COMMUNITY): Payer: Self-pay | Admitting: Pharmacy Technician

## 2012-04-14 NOTE — Telephone Encounter (Signed)
He wants to have  chemo on Monday bBecause he has business appointments on Thursday and he does not want  his pump on for these appointments. He requests to schedule lab and chemo on Monday and Dr Donnald Garre on Tuesday as scheduled  Per Dr Donnald Garre -he needs to discuss scan with pt first . I called pt and told him this and that he may not get chemo -he offered that he is having a surgical procedure Friday anyway. HE said he will keep appointment on tuesday

## 2012-04-15 ENCOUNTER — Telehealth: Payer: Self-pay | Admitting: *Deleted

## 2012-04-15 NOTE — Telephone Encounter (Signed)
left voice message informing the patient of the new date and time on 04-19-2012 at 12:00pm

## 2012-04-19 ENCOUNTER — Encounter (HOSPITAL_COMMUNITY)
Admission: RE | Admit: 2012-04-19 | Discharge: 2012-04-19 | Disposition: A | Discharge status: Home / Self Care | Payer: Medicare Other | Source: Ambulatory Visit | Attending: Orthopedic Surgery | Admitting: Orthopedic Surgery

## 2012-04-19 ENCOUNTER — Encounter (HOSPITAL_COMMUNITY): Payer: Self-pay

## 2012-04-19 ENCOUNTER — Other Ambulatory Visit: Payer: Medicare Other | Admitting: Lab

## 2012-04-19 ENCOUNTER — Ambulatory Visit: Payer: Medicare Other

## 2012-04-19 ENCOUNTER — Ambulatory Visit: Payer: Medicare Other | Admitting: Internal Medicine

## 2012-04-19 HISTORY — DX: Presence of other vascular implants and grafts: Z95.828

## 2012-04-19 HISTORY — DX: Sleep apnea, unspecified: G47.30

## 2012-04-19 LAB — COMPREHENSIVE METABOLIC PANEL
ALT: 42 U/L (ref 0–53)
Alkaline Phosphatase: 249 U/L — ABNORMAL HIGH (ref 39–117)
CO2: 28 mEq/L (ref 19–32)
Calcium: 9.2 mg/dL (ref 8.4–10.5)
GFR calc Af Amer: >90 mL/min (ref >90)
GFR calc non Af Amer: 87 mL/min — ABNORMAL LOW (ref >90)
Glucose, Bld: 139 mg/dL — ABNORMAL HIGH (ref 70–99)
Sodium: 136 mEq/L (ref 135–145)

## 2012-04-19 LAB — CBC
HCT: 29.9 % — ABNORMAL LOW (ref 39.0–52.0)
Hemoglobin: 10.3 g/dL — ABNORMAL LOW (ref 13.0–17.0)
MCH: 34.7 pg — ABNORMAL HIGH (ref 26.0–34.0)
RBC: 2.97 MIL/uL — ABNORMAL LOW (ref 4.22–5.81)

## 2012-04-19 LAB — SURGICAL PCR SCREEN: MRSA, PCR: NEGATIVE

## 2012-04-19 NOTE — Pre-Procedure Instructions (Addendum)
CBC, CMET, EKG WERE DONE PREOP AT [facility] TODAY - AS PER ANESTHESIOLOGIST'S GUIDELINES.  PT HAS CT CHEST REPORT IN EPIC FROM 04/12/12. PREOP INSTRUCTIONS DISCUSSED WITH PT USING TEACH BACK METHOD.

## 2012-04-19 NOTE — Patient Instructions (Addendum)
YOUR SURGERY IS SCHEDULED ON: Friday  8/30  AT 12:00 PM  REPORT TO [place] [facility] AT:  9:30 AM      PHONE # FOR SHORT STAY IS [phone]  DO NOT EAT ANYTHING AFTER MIDNIGHT THE NIGHT BEFORE YOUR SURGERY.  YOU MAY BRUSH YOUR TEETH, NO FOOD, NO CHEWING GUM, NO MINTS, NO CANDIES, NO CHEWING TOBACCO. MAY HAVE CLEAR LIQUIDS TO DRINK FROM MIDNIGHT UNTIL 6:00 AM - LIKE WATER, APPLE JUICE.   NOTHING TO DRINK AFTER 6:00 AM THE DAY OF YOUR SURGERY.  PLEASE TAKE THE FOLLOWING MEDICATIONS THE AM OF YOUR SURGERY WITH A FEW SIPS OF WATER:  OXYCONTIN, ZANTAC    IF YOU USE INHALERS--USE YOUR INHALERS THE AM OF YOUR SURGERY AND BRING INHALERS TO THE HOSPITAL -TAKE TO SURGERY.    IF YOU ARE DIABETIC:  DO NOT TAKE ANY DIABETIC MEDICATIONS THE AM OF YOUR SURGERY.  IF YOU TAKE INSULIN IN THE EVENINGS--PLEASE ONLY TAKE 1/2 NORMAL EVENING DOSE THE NIGHT BEFORE YOUR SURGERY.  NO INSULIN THE AM OF YOUR SURGERY.  IF YOU HAVE SLEEP APNEA AND USE CPAP OR BIPAP--PLEASE BRING THE MASK --NOT THE MACHINE-NOT THE TUBING   -JUST THE MASK. DO NOT BRING VALUABLES, MONEY, CREDIT CARDS.  CONTACT LENS, DENTURES / PARTIALS, GLASSES SHOULD NOT BE WORN TO SURGERY AND IN MOST CASES-HEARING AIDS WILL NEED TO BE REMOVED.  BRING YOUR GLASSES CASE, ANY EQUIPMENT NEEDED FOR YOUR CONTACT LENS. FOR PATIENTS ADMITTED TO THE HOSPITAL--CHECK OUT TIME THE DAY OF DISCHARGE IS 11:00 AM.  ALL INPATIENT ROOMS ARE PRIVATE - WITH BATHROOM, TELEPHONE, TELEVISION AND WIFI INTERNET. IF YOU ARE BEING DISCHARGED THE SAME DAY OF YOUR SURGERY--YOU CAN NOT DRIVE YOURSELF HOME--AND SHOULD NOT GO HOME ALONE BY TAXI OR BUS.  NO DRIVING OR OPERATING MACHINERY FOR 24 HOURS FOLLOWING ANESTHESIA / PAIN MEDICATIONS.                            SPECIAL INSTRUCTIONS:  CHLORHEXIDINE SOAP SHOWER (other brand names are Betasept and Hibiclens ) PLEASE SHOWER WITH CHLORHEXIDINE THE NIGHT BEFORE YOUR SURGERY AND THE AM OF YOUR SURGERY. DO NOT USE  CHLORHEXIDINE ON YOUR FACE OR PRIVATE AREAS--YOU MAY USE YOUR NORMAL SOAP THOSE AREAS AND YOUR NORMAL SHAMPOO.  WOMEN SHOULD AVOID SHAVING UNDER ARMS AND SHAVING LEGS 48 HOURS BEFORE USING CHLORHEXIDINE TO AVOID SKIN IRRITATION.  DO NOT USE IF ALLERGIC TO CHLORHEXIDINE.  PLEASE READ OVER ANY  FACT SHEETS THAT YOU WERE GIVEN: MRSA INFORMATION, INCENTIVE SPIROMETER INFORMATION.  PT'S WIFE CHARLENE Enamorado WILL BE WITH HIM THE DAY OF SURGERY AND CAN DRIVE HIM HOME AND BE WITH HIM FIRST 44 HRS AFTER HIS SURGERY.  PHONE [phone]

## 2012-04-19 NOTE — Pre-Procedure Instructions (Signed)
PT'S PREOP CMET REPORT FAXED TO DR. Deri Fuelling OFFICE -ALK PHOS [phone] -PT HAS METASTATIC PANCREATIC CANCER.

## 2012-04-20 ENCOUNTER — Ambulatory Visit (HOSPITAL_BASED_OUTPATIENT_CLINIC_OR_DEPARTMENT_OTHER): Payer: Medicare Other | Admitting: Internal Medicine

## 2012-04-20 ENCOUNTER — Other Ambulatory Visit (HOSPITAL_BASED_OUTPATIENT_CLINIC_OR_DEPARTMENT_OTHER): Payer: Medicare Other | Admitting: Lab

## 2012-04-20 ENCOUNTER — Ambulatory Visit: Payer: Medicare Other

## 2012-04-20 ENCOUNTER — Telehealth: Payer: Self-pay | Admitting: Internal Medicine

## 2012-04-20 VITALS — BP 123/71 | HR 73 | Temp 97.6°F | Resp 20 | Ht 67.0 in | Wt 153.8 lb

## 2012-04-20 DIAGNOSIS — C252 Malignant neoplasm of tail of pancreas: Secondary | ICD-10-CM

## 2012-04-20 DIAGNOSIS — C787 Secondary malignant neoplasm of liver and intrahepatic bile duct: Secondary | ICD-10-CM

## 2012-04-20 DIAGNOSIS — C259 Malignant neoplasm of pancreas, unspecified: Secondary | ICD-10-CM

## 2012-04-20 DIAGNOSIS — C779 Secondary and unspecified malignant neoplasm of lymph node, unspecified: Secondary | ICD-10-CM

## 2012-04-20 LAB — CBC WITH DIFFERENTIAL/PLATELET
Basophils Absolute: 0 10*3/uL (ref 0.0–0.1)
Eosinophils Absolute: 0.6 10*3/uL — ABNORMAL HIGH (ref 0.0–0.5)
HGB: 10.2 g/dL — ABNORMAL LOW (ref 13.0–17.1)
MCV: 106.5 fL — ABNORMAL HIGH (ref 79.3–98.0)
MONO#: 1.4 10*3/uL — ABNORMAL HIGH (ref 0.1–0.9)
MONO%: 25.6 % — ABNORMAL HIGH (ref 0.0–14.0)
NEUT#: 2.3 10*3/uL (ref 1.5–6.5)
RBC: 2.85 10*6/uL — ABNORMAL LOW (ref 4.20–5.82)
RDW: 16.6 % — ABNORMAL HIGH (ref 11.0–14.6)
WBC: 5.4 10*3/uL (ref 4.0–10.3)
lymph#: 1 10*3/uL (ref 0.9–3.3)

## 2012-04-20 LAB — COMPREHENSIVE METABOLIC PANEL (CC13)
ALT: 43 U/L (ref 0–55)
Albumin: 3 g/dL — ABNORMAL LOW (ref 3.5–5.0)
Alkaline Phosphatase: 250 U/L — ABNORMAL HIGH (ref 40–150)
Chloride: 100 mEq/L (ref 98–107)
Glucose: 123 mg/dl — ABNORMAL HIGH (ref 70–99)
Potassium: 4.3 mEq/L (ref 3.5–5.1)
Sodium: 135 mEq/L — ABNORMAL LOW (ref 136–145)

## 2012-04-20 LAB — CORRECTED CALCIUM (CC13): Calcium, Corrected: 9.9 mg/dL (ref 8.4–10.4)

## 2012-04-20 NOTE — Progress Notes (Signed)
[facility] [phone]   [phone]  OFFICE PROGRESS NOTE  [name], MD Dr. Sharman Crate Hamrick [address] [place] [address]  DIAGNOSIS: Metastatic pancreatic adenocarcinoma, now with metastatic disease to the liver and the abdominal wall, initially diagnosed with stage IIIB(T3, N1, M0) pancreatic tail adenocarcinoma in November of 2011.   PRIOR THERAPY:  #1 distal pancreatectomy and splenectomy on 06/25/2010  #2 adjuvant chemotherapy with gemcitabine started 08/20/2010 and completed 2 cycles on 12/02/2010.  #3 status post concurrent chemoradiation with Xeloda from 12/15/2010 on 10 01/24/2011.  #4 status post 2 more cycles of adjuvant gemcitabine with gemcitabine completed on 04/04/2011.  #5 FOLFOX giving every 2 weeks status post 12 cycles.  #6 Continuous infusion 5-FU and leucovorin every 2 weeks. Status post 4 cycles discontinued today secondary to disease progression.  CURRENT THERAPY: The patient was started on 05/03/2012 the first cycle of systemic chemotherapy with gemcitabine 800 mg/M2 in addition to Abraxane 80 mg/M2 on weekly basis for 3 out of every 4 weeks.  INTERVAL HISTORY: Philip Shaw 77 y.o. male returns to the clinic today for followup visit accompanied by his wife. The patient is feeling fine with no specific complaints except for mild fatigue. The patient denied having any significant chest pain, shortness breath, cough or hemoptysis. He denied having any significant weight loss or night sweats. He tolerated the last fourth cycle of his continuous infusion 5-FU and leucovorin fairly well. He has repeat CT scan of the chest, abdomen and pelvis and he is here today for evaluation and discussion of his scan results.  MEDICAL HISTORY: Past Medical History  Diagnosis Date  . Heart murmur   . Abdominal pain, periumbilical   . Constipation   . Diabetes mellitus   . Hypertension   . Sleep apnea     pt states he  no longer uses his cpap - lost weight  . pancreatic ca dx'd 04/2010    METASTATIC PANCREATIC CA WITH METS TO LIVER AND ABDOMINAL WALL  -chemo every 2 weeks - ONCOLOGIST DR. Davone Shinault WITH [facility].  Marland Kitchen Port-a-cath in place     RIGHT UPPER CHEST - FOR CHEMO EVERY 2 WEEKS    ALLERGIES:   has no known allergies.  MEDICATIONS:  Current Outpatient Prescriptions  Medication Sig Dispense Refill  . Alum & Mag Hydroxide-Simeth (MAGIC MOUTHWASH) SOLN Take 5 mLs by mouth QID.  240 mL  0  . aspirin 81 MG tablet Take 81 mg by mouth every morning.       . insulin glargine (LANTUS) 100 UNIT/ML injection Inject into the skin at bedtime. 32 units every night except week of chemo pt would increase to 37      . lidocaine-prilocaine (EMLA) cream Apply 1 application topically as needed.  30 g  2  . magnesium hydroxide (MILK OF MAGNESIA) 400 MG/5ML suspension Take by mouth daily.       . mirtazapine (REMERON) 30 MG tablet Take 1 tablet (30 mg total) by mouth at bedtime.  90 tablet  0  . Multiple Vitamins-Minerals (CENTRUM PO) Take 1 tablet by mouth daily.       . ondansetron (ZOFRAN) 8 MG tablet Take 1 tablet (8 mg total) by mouth every 12 (twelve) hours as needed for nausea.  60 tablet  2  . oxyCODONE (OXY IR/ROXICODONE) 5 MG immediate release tablet Take 1 tablet (5 mg total) by mouth every 4 (four) hours as needed.  30 tablet  0  . oxyCODONE (OXYCONTIN) 10 MG 12 hr tablet Take 1 tablet (10 mg total) by mouth every 12 (twelve) hours.  60 tablet  0  . pravastatin (PRAVACHOL) 20 MG tablet Take 20 mg by mouth at bedtime.       . ranitidine (ZANTAC) 150 MG tablet Take 150 mg by mouth 2 (two) times daily.       . Tamsulosin HCl (FLOMAX) 0.4 MG CAPS Once or twice daily      . terazosin (HYTRIN) 2 MG capsule Take 2 mg by mouth 2 (two) times daily.       Marland Kitchen DISCONTD: metFORMIN (GLUCOPHAGE) 850 MG tablet Take 850 mg by mouth 2 (two) times daily with a meal.         SURGICAL HISTORY:  Past  Surgical History  Procedure Date  . Appendectomy 36-5 years old  . Pancreatectomy 06/2010    with splenectomy  . Inguinal hernia repair as infant, age 89    LIH- infant, RIH age 39    REVIEW OF SYSTEMS:  A comprehensive review of systems was negative except for: Constitutional: positive for fatigue Gastrointestinal: positive for Intermittent abdominal pain   PHYSICAL EXAMINATION: General appearance: alert, cooperative and no distress Head: Normocephalic, without obvious abnormality, atraumatic Neck: no adenopathy Lymph nodes: Cervical, supraclavicular, and axillary nodes normal. Resp: clear to auscultation bilaterally Cardio: regular rate and rhythm, S1, S2 normal, no murmur, click, rub or gallop GI: soft, non-tender; bowel sounds normal; no masses,  no organomegaly Extremities: extremities normal, atraumatic, no cyanosis or edema Neurologic: Alert and oriented X 3, normal strength and tone. Normal symmetric reflexes. Normal coordination and gait  ECOG PERFORMANCE STATUS: 1 - Symptomatic but completely ambulatory  There were no vitals taken for this visit.  LABORATORY DATA: Lab Results  Component Value Date   WBC 5.4 04/20/2012   HGB 10.2* 04/20/2012   HCT 30.4* 04/20/2012   MCV 106.5* 04/20/2012   PLT 252 04/20/2012      Chemistry      Component Value Date/Time   NA 136 04/19/2012 0930   K 4.3 04/19/2012 0930   CL 100 04/19/2012 0930   CO2 28 04/19/2012 0930   BUN 18 04/19/2012 0930   CREATININE 0.76 04/19/2012 0930      Component Value Date/Time   CALCIUM 9.2 04/19/2012 0930   ALKPHOS 249* 04/19/2012 0930   AST 45* 04/19/2012 0930   ALT 42 04/19/2012 0930   BILITOT 0.6 04/19/2012 0930       RADIOGRAPHIC STUDIES: Ct Chest W Contrast  04/12/2012  *RADIOLOGY REPORT*  Clinical Data:  Metastatic pancreas cancer  CT CHEST, ABDOMEN AND PELVIS WITH CONTRAST  Technique:  Multidetector CT imaging of the chest, abdomen and pelvis was performed following the standard protocol during  bolus administration of intravenous contrast.  Contrast: OMNIPAQUE IOHEXOL 300 MG/ML  SOLN  Comparison:  01/22/2012   CT CHEST  Findings:  There is no axillary or supraclavicular adenopathy.  There are no enlarged mediastinal or hilar lymph nodes.  Calcified atherosclerotic disease is noted involving the LAD coronary artery. No pericardial or pleural effusion noted.  The lungs are clear.  No suspicious pulmonary nodule or mass noted.  Similar appearance of fracture involving the inferior aspect of the right scapula.  Underlying lucent lesion is suspected.  No other aggressive bone lesions are identified.  IMPRESSION:  1.  No acute findings and no evidence for pulmonary metastatic disease. 2.  Similar appearance of pathologic fracture  involving the inferior aspect of the right scapula.  Concerning for bone metastasis.   CT ABDOMEN AND PELVIS  Findings:  Metastatic lesion within the left hepatic lobe with peripheral enhancement measures 2.3 cm, image 17.  This is increased from 1.4 cm previously.  Along the dome of the liver there is a small area of low density which measures 8.4 mm, image 10.  This is new from previous exam and is suspicious for metastatic disease.  Pneumobilia is identified consistent with biliary patency.  New from previous exam.  This may reflect interval instrumentation. High density material is noted layering along the dependent portion of the gallbladder, also new from previous exam.  This may reflect refluxed contrast material.  Less likely interval development of gallstones.   Calcifications involving the head and neck of the pancreas are noted compatible with chronic pancreatitis.   Ectasia of the pancreatic duct within the head and neck of the pancreas is noted.  There are postoperative changes of distal pancreatectomy. This is similar to previous exam.  Prior splenectomy.  The adrenal glands are both normal.  Nonobstructing calculi are noted within the inferior poles of both  kidneys.  Similar appearance of the bilateral renal cysts. Complex septated cyst arising from the inferior pole of the right kidney is unchanged from previous examination measuring 2.1 x 1.5 cm, image 79. Urinary bladder is unremarkable.  Enlargement of the prostate gland is again noted.  The seminal vesicles appear normal.  There is mild soft tissue stranding which encases the superior mesenteric artery.  This is unchanged from previous exam, image 81. No metastatic adenopathy noted within the upper abdomen.  There is no pelvic or inguinal adenopathy.  The stomach appears normal.  The proximal and mid small bowel loops are increased in caliber measuring up to 3.2 cm, image 88.  The distal small bowel loops and terminal ileum have a normal caliber. No obstructing colonic mass noted.  The colon appears normal.  Evidence of transperitoneal spread of tumor is again identified. Tumor nodule along the undersurface of the ventral abdominal wall measures 1.8 cm, image 65.  Stable from previous exam.  Right lower quadrant tumor deposit is identified measuring 2.0 cm, image 95. Previously 2.1 cm.  Left upper quadrant tumor is noted measuring 2.8 by 4.3 cm, image 60.  Stable from previous exam.  Review of the visualized osseous structures shows lumbar degenerative disc disease.  IMPRESSION:  1.  Increasing size of the liver metastasis. 2.  Peritoneal disease is stable when compared with previous exam. 3.  Increased caliber of the proximal and mid small bowel loops with normal caliber distal small bowel.  Suspicious for partial obstruction. 4.  Bilateral nonobstructing renal calculi. 5.  Stable complex septated cyst involving the lower pole of the right kidney.   Original Report Authenticated By: Rosealee Albee, M.D.     ASSESSMENT: This is a very pleasant 77 years old white male with metastatic pancreatic adenocarcinoma status post several chemotherapy regimens most recently with her continuous infusion 5-FU and  leucovorin but unfortunately the patient has evidence for disease progression especially in the liver.   PLAN: I discussed the scan results with the patient and his wife. He has mild disease progression in the liver. I gave the patient the option of continuing the treatment with continuous infusion 5-FU and leucovorin versus discontinuing this treatment and start the new regimen with reduced dose the gemcitabine and Abraxane. The patient prefers to try the new regimen. He would  be treated with gemcitabine 800 mg/M2 in addition to Abraxane 80 mg/M2 on a weekly basis for 3 weeks every 4 weeks. I discussed with the patient adverse effect of this treatment including but not limited to alopecia, myelosuppression, nausea and vomiting, peripheral neuropathy, liver or renal dysfunction. The patient agreed to proceed with the treatment as planned. His first cycle he is expected to be on 05/03/2012 because the patient needs some time for arthroscopic surgery of his knee next week. He would come back for followup visit in 3 weeks for evaluation and management any adverse effect of his treatment. The patient was advised to call me immediately if he has any concerning symptoms and interval.  All questions were answered. The patient knows to call the clinic with any problems, questions or concerns. We can certainly see the patient much sooner if necessary.  I spent 15 minutes counseling the patient face to face. The total time spent in the appointment was 25 minutes.

## 2012-04-20 NOTE — Pre-Procedure Instructions (Signed)
FAXED CMET REPORT RECEIVED BACK FROM DR. ALUISIO-ELEVATED ALK PHOS NOTED-NO ACTION NEEDED.

## 2012-04-20 NOTE — Patient Instructions (Signed)
You have evidence for disease progression on his recent CT scan of the chest, abdomen and pelvis. We will discontinue the previous chemotherapy. We will start the new systemic chemotherapy with gemcitabine and Abraxane 3 weeks out of every 4 weeks. First dose of chemotherapy expected on 05/03/2012.

## 2012-04-20 NOTE — Telephone Encounter (Signed)
Gave pt appt for September 2013 lab, ML and chemo

## 2012-04-21 ENCOUNTER — Telehealth: Payer: Self-pay | Admitting: Medical Oncology

## 2012-04-21 NOTE — Telephone Encounter (Signed)
Confirmed appts for sept

## 2012-04-22 ENCOUNTER — Telehealth: Payer: Self-pay | Admitting: Medical Oncology

## 2012-04-22 NOTE — Telephone Encounter (Signed)
Daughter called and wants to know why Dr Donnald Garre is going back to using gemzar when the MD at [facility] said Gemzar should not be used again . I explained to her that he ordered gemzar  in combination with abraxane . She would like a call from Dr Donnald Garre [phone] because she is confused why he is getting gemzar again,

## 2012-04-22 NOTE — H&P (Signed)
CC- Philip Shaw is a 77 y.o. male who presents with right knee pain.  HPI- . Knee Pain: Patient presents with knee pain involving the  right knee. Onset of the symptoms was several months ago. Inciting event: none known. Current symptoms include locking, pain located laterally, stiffness and swelling. Pain is aggravated by pivoting, rising after sitting, squatting and standing.  Patient has had no prior knee problems. Evaluation to date: MRI: abnormal lateral meniscal tear and loose bodies. Treatment to date: avoidance of offending activity and rest.  Past Medical History  Diagnosis Date  . Heart murmur   . Abdominal pain, periumbilical   . Constipation   . Diabetes mellitus   . Hypertension   . Sleep apnea     pt states he no longer uses his cpap - lost weight  . pancreatic ca dx'd 04/2010    METASTATIC PANCREATIC CA WITH METS TO LIVER AND ABDOMINAL WALL  -chemo every 2 weeks - ONCOLOGIST DR. MOHAMED MOHAMED WITH [facility].  Marland Kitchen Port-a-cath in place     RIGHT UPPER CHEST - FOR CHEMO EVERY 2 WEEKS    Past Surgical History  Procedure Date  . Appendectomy 1-58 years old  . Pancreatectomy 06/2010    with splenectomy  . Inguinal hernia repair as infant, age 38    LIH- infant, RIH age 38    Prior to Admission medications   Medication Sig Start Date End Date Taking? Authorizing Provider  Alum & Mag Hydroxide-Simeth (MAGIC MOUTHWASH) SOLN Take 5 mLs by mouth QID. 01/12/12   Si Gaul, MD  aspirin 81 MG tablet Take 81 mg by mouth every morning.     Historical Provider, MD  insulin glargine (LANTUS) 100 UNIT/ML injection Inject into the skin at bedtime. 32 units every night except week of chemo pt would increase to 37    Historical Provider, MD  lidocaine-prilocaine (EMLA) cream Apply 1 application topically as needed. 09/03/11   Si Gaul, MD  magnesium hydroxide (MILK OF MAGNESIA) 400 MG/5ML suspension Take by mouth daily.     Historical Provider, MD  mirtazapine  (REMERON) 30 MG tablet Take 1 tablet (30 mg total) by mouth at bedtime. 03/30/12   Si Gaul, MD  Multiple Vitamins-Minerals (CENTRUM PO) Take 1 tablet by mouth daily.     Historical Provider, MD  ondansetron (ZOFRAN) 8 MG tablet Take 1 tablet (8 mg total) by mouth every 12 (twelve) hours as needed for nausea. 03/22/12   Conni Slipper, PA  oxyCODONE (OXY IR/ROXICODONE) 5 MG immediate release tablet Take 1 tablet (5 mg total) by mouth every 4 (four) hours as needed. 10/13/11   Si Gaul, MD  oxyCODONE (OXYCONTIN) 10 MG 12 hr tablet Take 1 tablet (10 mg total) by mouth every 12 (twelve) hours. 03/08/12   Conni Slipper, PA  pravastatin (PRAVACHOL) 20 MG tablet Take 20 mg by mouth at bedtime.  05/26/11   Historical Provider, MD  ranitidine (ZANTAC) 150 MG tablet Take 150 mg by mouth 2 (two) times daily.  05/22/11   Historical Provider, MD  Tamsulosin HCl (FLOMAX) 0.4 MG CAPS Once or twice daily 01/05/12   Historical Provider, MD  terazosin (HYTRIN) 2 MG capsule Take 2 mg by mouth 2 (two) times daily.  05/26/11   Historical Provider, MD   KNEE EXAM antalgic gait, soft tissue tenderness over lateral joint line, effusion, negative drawer sign, collateral ligaments intact, normal ipsilateral hip exam  Physical Examination: General appearance - alert, well appearing, and  in no distress Mental status - alert, oriented to person, place, and time Chest - clear to auscultation, no wheezes, rales or rhonchi, symmetric air entry Heart - normal rate, regular rhythm, normal S1, S2, no murmurs, rubs, clicks or gallops Abdomen - soft, nontender, nondistended, no masses or organomegaly Neurological - alert, oriented, normal speech, no focal findings or movement disorder noted   Asessment/Plan--- Rightt knee lateral meniscal tear- - Plan right knee arthroscopy with meniscal debridement. Procedure risks and potential comps discussed with patient who elects to proceed. Goals are decreased pain and increased  function with a high likelihood of achieving both

## 2012-04-23 ENCOUNTER — Encounter (HOSPITAL_COMMUNITY): Payer: Self-pay | Admitting: Anesthesiology

## 2012-04-23 ENCOUNTER — Ambulatory Visit (HOSPITAL_COMMUNITY): Payer: Medicare Other | Admitting: Anesthesiology

## 2012-04-23 ENCOUNTER — Encounter (HOSPITAL_COMMUNITY)
Admission: RE | Disposition: A | Discharge status: Home / Self Care | Payer: Self-pay | Source: Ambulatory Visit | Attending: Orthopedic Surgery

## 2012-04-23 ENCOUNTER — Ambulatory Visit (HOSPITAL_COMMUNITY)
Admission: RE | Admit: 2012-04-23 | Discharge: 2012-04-23 | Disposition: A | Discharge status: Home / Self Care | Payer: Medicare Other | Source: Ambulatory Visit | Attending: Orthopedic Surgery | Admitting: Orthopedic Surgery

## 2012-04-23 ENCOUNTER — Encounter (HOSPITAL_COMMUNITY): Payer: Self-pay | Admitting: *Deleted

## 2012-04-23 DIAGNOSIS — Z794 Long term (current) use of insulin: Secondary | ICD-10-CM | POA: Insufficient documentation

## 2012-04-23 DIAGNOSIS — G4730 Sleep apnea, unspecified: Secondary | ICD-10-CM | POA: Insufficient documentation

## 2012-04-23 DIAGNOSIS — C50919 Malignant neoplasm of unspecified site of unspecified female breast: Secondary | ICD-10-CM | POA: Insufficient documentation

## 2012-04-23 DIAGNOSIS — C259 Malignant neoplasm of pancreas, unspecified: Secondary | ICD-10-CM | POA: Insufficient documentation

## 2012-04-23 DIAGNOSIS — M2340 Loose body in knee, unspecified knee: Secondary | ICD-10-CM | POA: Insufficient documentation

## 2012-04-23 DIAGNOSIS — S83289A Other tear of lateral meniscus, current injury, unspecified knee, initial encounter: Secondary | ICD-10-CM | POA: Diagnosis present

## 2012-04-23 DIAGNOSIS — Z01810 Encounter for preprocedural cardiovascular examination: Secondary | ICD-10-CM | POA: Insufficient documentation

## 2012-04-23 DIAGNOSIS — X58XXXA Exposure to other specified factors, initial encounter: Secondary | ICD-10-CM | POA: Insufficient documentation

## 2012-04-23 DIAGNOSIS — C787 Secondary malignant neoplasm of liver and intrahepatic bile duct: Secondary | ICD-10-CM | POA: Insufficient documentation

## 2012-04-23 DIAGNOSIS — Z79899 Other long term (current) drug therapy: Secondary | ICD-10-CM | POA: Insufficient documentation

## 2012-04-23 DIAGNOSIS — E119 Type 2 diabetes mellitus without complications: Secondary | ICD-10-CM | POA: Insufficient documentation

## 2012-04-23 DIAGNOSIS — C779 Secondary and unspecified malignant neoplasm of lymph node, unspecified: Secondary | ICD-10-CM | POA: Insufficient documentation

## 2012-04-23 DIAGNOSIS — Z01812 Encounter for preprocedural laboratory examination: Secondary | ICD-10-CM | POA: Insufficient documentation

## 2012-04-23 DIAGNOSIS — I10 Essential (primary) hypertension: Secondary | ICD-10-CM | POA: Insufficient documentation

## 2012-04-23 HISTORY — PX: KNEE ARTHROSCOPY: SHX127

## 2012-04-23 SURGERY — ARTHROSCOPY, KNEE
Anesthesia: General | Site: Knee | Laterality: Right | Wound class: Clean

## 2012-04-23 MED ORDER — CHLORHEXIDINE GLUCONATE 4 % EX LIQD
60.0000 mL | Freq: Once | CUTANEOUS | Status: DC
  Filled 2012-04-23: qty 60

## 2012-04-23 MED ORDER — CEFAZOLIN SODIUM-DEXTROSE 2-3 GM-% IV SOLR
INTRAVENOUS | Status: AC
  Filled 2012-04-23: qty 50

## 2012-04-23 MED ORDER — LACTATED RINGERS IR SOLN
Status: DC | PRN
  Administered 2012-04-23 (×3): 3000 mL

## 2012-04-23 MED ORDER — FENTANYL CITRATE 0.05 MG/ML IJ SOLN
25.0000 ug | INTRAMUSCULAR | Status: DC | PRN
  Administered 2012-04-23: 50 ug via INTRAVENOUS

## 2012-04-23 MED ORDER — METHOCARBAMOL 500 MG PO TABS: 500.0000 mg | ORAL_TABLET | Freq: Four times a day (QID) | ORAL | Status: AC

## 2012-04-23 MED ORDER — BUPIVACAINE-EPINEPHRINE PF 0.25-1:200000 % IJ SOLN
INTRAMUSCULAR | Status: AC
  Filled 2012-04-23: qty 30

## 2012-04-23 MED ORDER — PROMETHAZINE HCL 25 MG/ML IJ SOLN: 6.2500 mg | INTRAMUSCULAR | Status: DC | PRN

## 2012-04-23 MED ORDER — BUPIVACAINE-EPINEPHRINE 0.25% -1:200000 IJ SOLN
INTRAMUSCULAR | Status: DC | PRN
  Administered 2012-04-23: 20 mL

## 2012-04-23 MED ORDER — LACTATED RINGERS IV SOLN
INTRAVENOUS | Status: DC | PRN
  Administered 2012-04-23: 11:00:00 via INTRAVENOUS

## 2012-04-23 MED ORDER — OXYCODONE-ACETAMINOPHEN 10-325 MG PO TABS: 1.0000 | ORAL_TABLET | ORAL | Status: AC | PRN

## 2012-04-23 MED ORDER — LACTATED RINGERS IV SOLN: INTRAVENOUS | Status: DC

## 2012-04-23 MED ORDER — OXYCODONE-ACETAMINOPHEN 5-325 MG PO TABS
2.0000 | ORAL_TABLET | Freq: Once | ORAL | Status: AC
  Administered 2012-04-23: 1 via ORAL
  Filled 2012-04-23: qty 1

## 2012-04-23 MED ORDER — FENTANYL CITRATE 0.05 MG/ML IJ SOLN
INTRAMUSCULAR | Status: DC | PRN
  Administered 2012-04-23 (×6): 25 ug via INTRAVENOUS

## 2012-04-23 MED ORDER — ONDANSETRON HCL 4 MG/2ML IJ SOLN
INTRAMUSCULAR | Status: DC | PRN
  Administered 2012-04-23: 4 mg via INTRAVENOUS

## 2012-04-23 MED ORDER — SODIUM CHLORIDE 0.9 % IV SOLN: INTRAVENOUS | Status: DC

## 2012-04-23 MED ORDER — MEPERIDINE HCL 50 MG/ML IJ SOLN: 6.2500 mg | INTRAMUSCULAR | Status: DC | PRN

## 2012-04-23 MED ORDER — CEFAZOLIN SODIUM-DEXTROSE 2-3 GM-% IV SOLR
2.0000 g | INTRAVENOUS | Status: AC
  Administered 2012-04-23: 2 g via INTRAVENOUS
  Filled 2012-04-23: qty 50

## 2012-04-23 MED ORDER — FENTANYL CITRATE 0.05 MG/ML IJ SOLN
INTRAMUSCULAR | Status: AC
  Filled 2012-04-23: qty 2

## 2012-04-23 MED ORDER — LACTATED RINGERS IV SOLN
INTRAVENOUS | Status: DC
Start: 2012-04-23 — End: 2012-04-23
  Administered 2012-04-23: 1000 mL via INTRAVENOUS

## 2012-04-23 MED ORDER — PROPOFOL 10 MG/ML IV BOLUS
INTRAVENOUS | Status: DC | PRN
  Administered 2012-04-23: 120 mg via INTRAVENOUS

## 2012-04-23 MED ORDER — ACETAMINOPHEN 10 MG/ML IV SOLN: 1000.0000 mg | Freq: Once | INTRAVENOUS | Status: DC

## 2012-04-23 MED ORDER — LACTATED RINGERS IV SOLN
INTRAVENOUS | Status: DC
Start: 2012-04-23 — End: 2012-04-23

## 2012-04-23 SURGICAL SUPPLY — 31 items
BANDAGE ELASTIC 6 VELCRO ST LF (GAUZE/BANDAGES/DRESSINGS) ×2 IMPLANT
BANDAGE GAUZE ELAST BULKY 4 IN (GAUZE/BANDAGES/DRESSINGS) ×2 IMPLANT
BLADE 4.2CUDA (BLADE) ×2 IMPLANT
CLOTH BEACON ORANGE TIMEOUT ST (SAFETY) ×2 IMPLANT
CUFF TOURN SGL QUICK 34 (TOURNIQUET CUFF) ×1
CUFF TRNQT CYL 34X4X40X1 (TOURNIQUET CUFF) ×1 IMPLANT
DRAPE U-SHAPE 47X51 STRL (DRAPES) ×2 IMPLANT
DRSG EMULSION OIL 3X3 NADH (GAUZE/BANDAGES/DRESSINGS) ×2 IMPLANT
DRSG PAD ABDOMINAL 8X10 ST (GAUZE/BANDAGES/DRESSINGS) ×2 IMPLANT
DURAPREP 26ML APPLICATOR (WOUND CARE) ×2 IMPLANT
GLOVE BIO SURGEON STRL SZ7.5 (GLOVE) IMPLANT
GLOVE BIO SURGEON STRL SZ8 (GLOVE) ×2 IMPLANT
GLOVE BIOGEL PI IND STRL 6.5 (GLOVE) ×1 IMPLANT
GLOVE BIOGEL PI IND STRL 8 (GLOVE) ×1 IMPLANT
GLOVE BIOGEL PI INDICATOR 6.5 (GLOVE) ×1
GLOVE BIOGEL PI INDICATOR 8 (GLOVE) ×1
GLOVE SURG SS PI 6.5 STRL IVOR (GLOVE) ×2 IMPLANT
GOWN STRL NON-REIN LRG LVL3 (GOWN DISPOSABLE) ×4 IMPLANT
MANIFOLD NEPTUNE II (INSTRUMENTS) ×2 IMPLANT
PACK ARTHROSCOPY WL (CUSTOM PROCEDURE TRAY) ×2 IMPLANT
PACK ICE MAXI GEL EZY WRAP (MISCELLANEOUS) ×6 IMPLANT
PADDING CAST COTTON 6X4 STRL (CAST SUPPLIES) ×2 IMPLANT
POSITIONER SURGICAL ARM (MISCELLANEOUS) ×2 IMPLANT
SET ARTHROSCOPY TUBING (MISCELLANEOUS) ×1
SET ARTHROSCOPY TUBING LN (MISCELLANEOUS) ×1 IMPLANT
SPONGE GAUZE 4X4 12PLY (GAUZE/BANDAGES/DRESSINGS) ×2 IMPLANT
SUT ETHILON 4 0 PS 2 18 (SUTURE) ×2 IMPLANT
TOWEL OR 17X26 10 PK STRL BLUE (TOWEL DISPOSABLE) ×2 IMPLANT
WAND 90 DEG TURBOVAC W/CORD (SURGICAL WAND) ×2 IMPLANT
WATER STERILE IRR 500ML POUR (IV SOLUTION) ×2 IMPLANT
WRAP KNEE MAXI GEL POST OP (GAUZE/BANDAGES/DRESSINGS) ×2 IMPLANT

## 2012-04-23 NOTE — Anesthesia Postprocedure Evaluation (Signed)
  Anesthesia Post-op Note  Patient: Jager Koska [facility]  Procedure(s) Performed: Procedure(s) (LRB): ARTHROSCOPY KNEE (Right)  Patient Location: PACU  Anesthesia Type: General  Level of Consciousness: awake and alert   Airway and Oxygen Therapy: Patient Spontanous Breathing  Post-op Pain: mild  Post-op Assessment: Post-op Vital signs reviewed, Patient's Cardiovascular Status Stable, Respiratory Function Stable, Patent Airway and No signs of Nausea or vomiting  Post-op Vital Signs: stable  Complications: No apparent anesthesia complications

## 2012-04-23 NOTE — Op Note (Signed)
Preoperative diagnosis-  Right knee lateral meniscal tear  Postoperative diagnosis Right- knee lateral meniscal tear   Plus Right knee multiple loose bodies  Procedure- Right knee arthroscopy with lateral  Meniscal debridement and removal multiple loose bodies  Surgeon- Gus Rankin. Yahel Fuston, MD  Anesthesia-General  EBL-  minimal Complications- None  Condition- PACU - hemodynamically stable.  Brief clinical note- -Philip Shaw is a 77 y.o.  male with a several month history of right knee pain and mechanical symptoms. Exam and history suggested lateral meniscal tear confirmed by MRI. He also had multiple loose bodies found on MRI and plain x-rays and was having mechanical symptoms of locking associated with these. The patient presents now for arthroscopy and debridement   Procedure in detail -       After successful administration of General anesthetic, a tourmiquet is placed high on the Right  thigh and the Right lower extremity is prepped and draped in the usual sterile fashion. Time out is performed by the surgical team. Standard superomedial and inferolateral portal sites are marked and incisions made with an 11 blade. The inflow cannula is passed through the superomedial portal and camera through the inferolateral portal and inflow is initiated. Arthroscopic visualization proceeds.      The undersurface of the patella and trochlea are visualized and there is mild chondromalacia patella and grade III changes on the trochlea but no unstable cartilage lesions. The medial and lateral gutters are visualized and there are multiple loose bodies in the medial gutter and intercondylar notch.. Flexion and valgus force is applied to the knee and the medial compartment is entered. A spinal needle is passed into the joint through the site marked for the inferomedial portal. A small incision is made and the dilator passed into the joint. The findings for the medial compartment are normal .The loose bodies  are removed from the medial gutter.  The intercondylar notch is visualized and the ACL appears normal. There are several large loose bodies present adjacent and anterior to the ACL. These are removed with the grasping forceps.      The lateral compartment is entered and the findings are lateral meniscal tear body and both horns with minimal chondromalacia. The tear is debrided to a stable base with baskets and a shaver and sealed off with the Arthrocare. The chondral surfaces are unremarkable. There are no loose bodies in the lateral gutter.     The joint is again inspected and there are no other tears, defects or loose bodies identified. The arthroscopic equipment is then removed from the inferior portals which are closed with interrupted 4-0 nylon. 20 ml of .25% Marcaine with epinephrine are injected through the inflow cannula and the cannula is then removed and the portal closed with nylon. The incisions are cleaned and dried and a bulky sterile dressing is applied. The patient is then awakened and transported to recovery in stable condition.   04/23/2012, 12:40 PM

## 2012-04-23 NOTE — Transfer of Care (Signed)
Immediate Anesthesia Transfer of Care Note  Patient: Philip Shaw [facility]  Procedure(s) Performed: Procedure(s) (LRB): ARTHROSCOPY KNEE (Right)  Patient Location: PACU  Anesthesia Type: General  Level of Consciousness: sedated, patient cooperative and responds to stimulaton  Airway & Oxygen Therapy: Patient Spontanous Breathing and Patient connected to face mask oxgen  Post-op Assessment: Report given to PACU RN and Post -op Vital signs reviewed and stable  Post vital signs: Reviewed and stable  Complications: No apparent anesthesia complications

## 2012-04-23 NOTE — Addendum Note (Signed)
Addendum  created 04/23/12 1330 by Paris Lore, CRNA   Modules edited:Anesthesia LDA

## 2012-04-23 NOTE — Interval H&P Note (Signed)
History and Physical Interval Note:  04/23/2012 11:21 AM  Philip Shaw  has presented today for surgery, with the diagnosis of right  knee lateral meniscus tear loose bodies  The various methods of treatment have been discussed with the patient and family. After consideration of risks, benefits and other options for treatment, the patient has consented to  Procedure(s) (LRB): ARTHROSCOPY KNEE (Right) as a surgical intervention .  The patient's history has been reviewed, patient examined, no change in status, stable for surgery.  I have reviewed the patient's chart and labs.  Questions were answered to the patient's satisfaction.     Loanne Drilling

## 2012-04-23 NOTE — Addendum Note (Signed)
Addendum  created 04/23/12 1330 by Orley Lawry M Evone Arseneau, CRNA   Modules edited:Anesthesia LDA    

## 2012-04-23 NOTE — Anesthesia Preprocedure Evaluation (Addendum)
Anesthesia Evaluation  Patient identified by MRN, date of birth, ID band Patient awake    Reviewed: Allergy & Precautions, H&P , NPO status , Patient's Chart, lab work & pertinent test results  Airway Mallampati: II TM Distance: >3 FB Neck ROM: Full    Dental No notable dental hx.    Pulmonary neg pulmonary ROS,  breath sounds clear to auscultation  Pulmonary exam normal       Cardiovascular hypertension, negative cardio ROS  - Valvular Problems/MurmursRhythm:Regular Rate:Normal     Neuro/Psych negative neurological ROS  negative psych ROS   GI/Hepatic negative GI ROS, Neg liver ROS, Metastatic pancreatic CA. Liver mets   Endo/Other  negative endocrine ROS  Renal/GU negative Renal ROS  negative genitourinary   Musculoskeletal negative musculoskeletal ROS (+)   Abdominal   Peds negative pediatric ROS (+)  Hematology negative hematology ROS (+)   Anesthesia Other Findings   Reproductive/Obstetrics negative OB ROS                           Anesthesia Physical Anesthesia Plan  ASA: III  Anesthesia Plan: General   Post-op Pain Management:    Induction: Intravenous  Airway Management Planned: LMA  Additional Equipment:   Intra-op Plan:   Post-operative Plan: Extubation in OR  Informed Consent: I have reviewed the patients History and Physical, chart, labs and discussed the procedure including the risks, benefits and alternatives for the proposed anesthesia with the patient or authorized representative who has indicated his/her understanding and acceptance.   Dental advisory given  Plan Discussed with: CRNA  Anesthesia Plan Comments:         Anesthesia Quick Evaluation

## 2012-04-27 ENCOUNTER — Encounter (HOSPITAL_COMMUNITY): Payer: Self-pay | Admitting: Orthopedic Surgery

## 2012-04-30 ENCOUNTER — Telehealth: Payer: Self-pay | Admitting: *Deleted

## 2012-04-30 NOTE — Telephone Encounter (Signed)
CBC and CMET dated 04/27/12 from [facility] given to Dr Donnald Garre to review, will be scanned into EPIC.  SLJ

## 2012-05-03 ENCOUNTER — Ambulatory Visit (HOSPITAL_BASED_OUTPATIENT_CLINIC_OR_DEPARTMENT_OTHER): Payer: Medicare Other

## 2012-05-03 ENCOUNTER — Other Ambulatory Visit: Payer: Self-pay | Admitting: Medical Oncology

## 2012-05-03 ENCOUNTER — Other Ambulatory Visit (HOSPITAL_BASED_OUTPATIENT_CLINIC_OR_DEPARTMENT_OTHER): Payer: Medicare Other | Admitting: Lab

## 2012-05-03 VITALS — BP 122/75 | HR 93 | Temp 98.3°F | Resp 18

## 2012-05-03 DIAGNOSIS — C259 Malignant neoplasm of pancreas, unspecified: Secondary | ICD-10-CM

## 2012-05-03 DIAGNOSIS — C252 Malignant neoplasm of tail of pancreas: Secondary | ICD-10-CM

## 2012-05-03 DIAGNOSIS — Z5111 Encounter for antineoplastic chemotherapy: Secondary | ICD-10-CM

## 2012-05-03 DIAGNOSIS — C787 Secondary malignant neoplasm of liver and intrahepatic bile duct: Secondary | ICD-10-CM

## 2012-05-03 DIAGNOSIS — R52 Pain, unspecified: Secondary | ICD-10-CM

## 2012-05-03 DIAGNOSIS — C50919 Malignant neoplasm of unspecified site of unspecified female breast: Secondary | ICD-10-CM

## 2012-05-03 LAB — CBC WITH DIFFERENTIAL/PLATELET
Eosinophils Absolute: 0.3 10*3/uL (ref 0.0–0.5)
HCT: 31.9 % — ABNORMAL LOW (ref 38.4–49.9)
LYMPH%: 19.8 % (ref 14.0–49.0)
MCV: 99.4 fL — ABNORMAL HIGH (ref 79.3–98.0)
MONO#: 1.2 10*3/uL — ABNORMAL HIGH (ref 0.1–0.9)
NEUT#: 3.6 10*3/uL (ref 1.5–6.5)
NEUT%: 56.6 % (ref 39.0–75.0)
Platelets: 446 10*3/uL — ABNORMAL HIGH (ref 140–400)
WBC: 6.3 10*3/uL (ref 4.0–10.3)

## 2012-05-03 LAB — COMPREHENSIVE METABOLIC PANEL (CC13)
ALT: 27 U/L (ref 0–55)
BUN: 22 mg/dL (ref 7.0–26.0)
CO2: 24 mEq/L (ref 22–29)
Calcium: 9.3 mg/dL (ref 8.4–10.4)
Chloride: 102 mEq/L (ref 98–107)
Creatinine: 0.7 mg/dL (ref 0.7–1.3)
Glucose: 117 mg/dl — ABNORMAL HIGH (ref 70–99)

## 2012-05-03 MED ORDER — HEPARIN SOD (PORK) LOCK FLUSH 100 UNIT/ML IV SOLN
500.0000 [IU] | Freq: Once | INTRAVENOUS | Status: AC | PRN
  Administered 2012-05-03: 500 [IU]
  Filled 2012-05-03: qty 5

## 2012-05-03 MED ORDER — SODIUM CHLORIDE 0.9 % IV SOLN
800.0000 mg/m2 | Freq: Once | INTRAVENOUS | Status: AC
  Administered 2012-05-03: 1444 mg via INTRAVENOUS
  Filled 2012-05-03: qty 38

## 2012-05-03 MED ORDER — SODIUM CHLORIDE 0.9 % IV SOLN
Freq: Once | INTRAVENOUS | Status: AC
  Administered 2012-05-03: 09:00:00 via INTRAVENOUS

## 2012-05-03 MED ORDER — PROCHLORPERAZINE MALEATE 10 MG PO TABS
10.0000 mg | ORAL_TABLET | Freq: Once | ORAL | Status: AC
  Administered 2012-05-03: 10 mg via ORAL

## 2012-05-03 MED ORDER — SODIUM CHLORIDE 0.9 % IJ SOLN
10.0000 mL | INTRAMUSCULAR | Status: DC | PRN
  Administered 2012-05-03: 10 mL
  Filled 2012-05-03: qty 10

## 2012-05-03 MED ORDER — PACLITAXEL PROTEIN-BOUND CHEMO INJECTION 100 MG
80.0000 mg/m2 | Freq: Once | INTRAVENOUS | Status: AC
  Administered 2012-05-03: 150 mg via INTRAVENOUS
  Filled 2012-05-03: qty 30

## 2012-05-03 MED ORDER — OXYCODONE HCL 10 MG PO TB12: 10.0000 mg | ORAL_TABLET | Freq: Two times a day (BID) | ORAL | Status: DC

## 2012-05-03 MED ORDER — PROCHLORPERAZINE EDISYLATE 5 MG/ML IJ SOLN: 10.0000 mg | Freq: Once | INTRAMUSCULAR | Status: DC

## 2012-05-03 NOTE — Telephone Encounter (Signed)
Rx given to pt. 

## 2012-05-03 NOTE — Patient Instructions (Signed)
[facility] Discharge Instructions for Patients Receiving Chemotherapy  Today you received the following chemotherapy agents Abraxane/Gemzar  To help prevent nausea and vomiting after your treatment, we encourage you to take your nausea medication as directed   If you develop nausea and vomiting that is not controlled by your nausea medication, call the clinic. If it is after clinic hours your family physician or the after hours number for the clinic or go to the Emergency Department.   BELOW ARE SYMPTOMS THAT SHOULD BE REPORTED IMMEDIATELY:  *FEVER GREATER THAN 100.5 F  *CHILLS WITH OR WITHOUT FEVER  NAUSEA AND VOMITING THAT IS NOT CONTROLLED WITH YOUR NAUSEA MEDICATION  *UNUSUAL SHORTNESS OF BREATH  *UNUSUAL BRUISING OR BLEEDING  TENDERNESS IN MOUTH AND THROAT WITH OR WITHOUT PRESENCE OF ULCERS  *URINARY PROBLEMS  *BOWEL PROBLEMS  UNUSUAL RASH Items with * indicate a potential emergency and should be followed up as soon as possible.  One of the nurses will contact you 24 hours after your treatment. Please let the nurse know about any problems that you may have experienced. Feel free to call the clinic you have any questions or concerns. The clinic phone number is [phone].   I have been informed and understand all the instructions given to me. I know to contact the clinic, my physician, or go to the Emergency Department if any problems should occur. I do not have any questions at this time, but understand that I may call the clinic during office hours   should I have any questions or need assistance in obtaining follow up care.    __________________________________________  _____________  __________ Signature of Patient or Authorized Representative            Date                   Time    __________________________________________ Nurse's Signature    

## 2012-05-04 ENCOUNTER — Telehealth: Payer: Self-pay | Admitting: Emergency Medicine

## 2012-05-04 NOTE — Telephone Encounter (Signed)
Spoke with patient for chemo follow up call.  Patient's only complaint is increased weakness, although the did go to work this am at 0700.  Patient denies NVD and states that he has been drinking has usual protein shakes that his wife makes for him.  Patient and his spouse are aware of his future appointments and instructed to call for any concerns in the meantime.

## 2012-05-06 ENCOUNTER — Telehealth: Payer: Self-pay | Admitting: Medical Oncology

## 2012-05-06 NOTE — Telephone Encounter (Signed)
States both hands are " getting more numb" with new chemo.  I called pt back and he said in the last couple of days the numbness is running up  Into his fingers and it is harder to button shirts . He can grip but can't tell how strong grip is. He denies arm weakness or any other symptoms.  I will inform Dr Arbutus Ped.

## 2012-05-10 ENCOUNTER — Encounter: Payer: Self-pay | Admitting: Physician Assistant

## 2012-05-10 ENCOUNTER — Ambulatory Visit (HOSPITAL_BASED_OUTPATIENT_CLINIC_OR_DEPARTMENT_OTHER): Payer: Medicare Other

## 2012-05-10 ENCOUNTER — Ambulatory Visit: Payer: Medicare Other | Admitting: Nutrition

## 2012-05-10 ENCOUNTER — Other Ambulatory Visit (HOSPITAL_BASED_OUTPATIENT_CLINIC_OR_DEPARTMENT_OTHER): Payer: Medicare Other | Admitting: Lab

## 2012-05-10 ENCOUNTER — Ambulatory Visit (HOSPITAL_BASED_OUTPATIENT_CLINIC_OR_DEPARTMENT_OTHER): Payer: Medicare Other | Admitting: Physician Assistant

## 2012-05-10 ENCOUNTER — Telehealth: Payer: Self-pay | Admitting: Internal Medicine

## 2012-05-10 VITALS — BP 120/72 | HR 90 | Temp 97.3°F | Resp 20 | Ht 67.0 in | Wt 150.8 lb

## 2012-05-10 DIAGNOSIS — Z5111 Encounter for antineoplastic chemotherapy: Secondary | ICD-10-CM

## 2012-05-10 DIAGNOSIS — C787 Secondary malignant neoplasm of liver and intrahepatic bile duct: Secondary | ICD-10-CM

## 2012-05-10 DIAGNOSIS — C259 Malignant neoplasm of pancreas, unspecified: Secondary | ICD-10-CM

## 2012-05-10 DIAGNOSIS — C252 Malignant neoplasm of tail of pancreas: Secondary | ICD-10-CM

## 2012-05-10 DIAGNOSIS — G5690 Unspecified mononeuropathy of unspecified upper limb: Secondary | ICD-10-CM

## 2012-05-10 LAB — CBC WITH DIFFERENTIAL/PLATELET
BASO%: 0.5 % (ref 0.0–2.0)
Basophils Absolute: 0 10*3/uL (ref 0.0–0.1)
EOS%: 4.9 % (ref 0.0–7.0)
HGB: 9.6 g/dL — ABNORMAL LOW (ref 13.0–17.1)
MCH: 33.1 pg (ref 27.2–33.4)
RDW: 15.2 % — ABNORMAL HIGH (ref 11.0–14.6)
WBC: 3.7 10*3/uL — ABNORMAL LOW (ref 4.0–10.3)
lymph#: 1.1 10*3/uL (ref 0.9–3.3)

## 2012-05-10 LAB — MAGNESIUM (CC13): Magnesium: 2.1 mg/dl (ref 1.5–2.5)

## 2012-05-10 LAB — COMPREHENSIVE METABOLIC PANEL (CC13)
ALT: 30 U/L (ref 0–55)
BUN: 23 mg/dL (ref 7.0–26.0)
CO2: 26 mEq/L (ref 22–29)
Creatinine: 0.7 mg/dL (ref 0.7–1.3)
Total Bilirubin: 0.5 mg/dL (ref 0.20–1.20)

## 2012-05-10 MED ORDER — HEPARIN SOD (PORK) LOCK FLUSH 100 UNIT/ML IV SOLN
500.0000 [IU] | Freq: Once | INTRAVENOUS | Status: AC | PRN
  Administered 2012-05-10: 500 [IU]
  Filled 2012-05-10: qty 5

## 2012-05-10 MED ORDER — PROCHLORPERAZINE MALEATE 10 MG PO TABS
10.0000 mg | ORAL_TABLET | Freq: Once | ORAL | Status: AC
  Administered 2012-05-10: 10 mg via ORAL

## 2012-05-10 MED ORDER — PACLITAXEL PROTEIN-BOUND CHEMO INJECTION 100 MG
80.0000 mg/m2 | Freq: Once | INTRAVENOUS | Status: AC
  Administered 2012-05-10: 150 mg via INTRAVENOUS
  Filled 2012-05-10: qty 30

## 2012-05-10 MED ORDER — SODIUM CHLORIDE 0.9 % IV SOLN
Freq: Once | INTRAVENOUS | Status: AC
  Administered 2012-05-10: 13:00:00 via INTRAVENOUS

## 2012-05-10 MED ORDER — SODIUM CHLORIDE 0.9 % IV SOLN
800.0000 mg/m2 | Freq: Once | INTRAVENOUS | Status: AC
  Administered 2012-05-10: 1444 mg via INTRAVENOUS
  Filled 2012-05-10: qty 38

## 2012-05-10 MED ORDER — SODIUM CHLORIDE 0.9 % IJ SOLN
10.0000 mL | INTRAMUSCULAR | Status: DC | PRN
  Administered 2012-05-10: 10 mL
  Filled 2012-05-10: qty 10

## 2012-05-10 NOTE — Patient Instructions (Addendum)
[facility] Discharge Instructions for Patients Receiving Chemotherapy  Today you received the following chemotherapy agents Gemzar and Abraxane  To help prevent nausea and vomiting after your treatment, we encourage you to take your nausea medication as prescribed.   If you develop nausea and vomiting that is not controlled by your nausea medication, call the clinic. If it is after clinic hours your family physician or the after hours number for the clinic or go to the Emergency Department.   BELOW ARE SYMPTOMS THAT SHOULD BE REPORTED IMMEDIATELY:  *FEVER GREATER THAN 100.5 F  *CHILLS WITH OR WITHOUT FEVER  NAUSEA AND VOMITING THAT IS NOT CONTROLLED WITH YOUR NAUSEA MEDICATION  *UNUSUAL SHORTNESS OF BREATH  *UNUSUAL BRUISING OR BLEEDING  TENDERNESS IN MOUTH AND THROAT WITH OR WITHOUT PRESENCE OF ULCERS  *URINARY PROBLEMS  *BOWEL PROBLEMS  UNUSUAL RASH Items with * indicate a potential emergency and should be followed up as soon as possible.  One of the nurses will contact you 24 hours after your treatment. Please let the nurse know about any problems that you may have experienced. Feel free to call the clinic you have any questions or concerns. The clinic phone number is [phone].   I have been informed and understand all the instructions given to me. I know to contact the clinic, my physician, or go to the Emergency Department if any problems should occur. I do not have any questions at this time, but understand that I may call the clinic during office hours   should I have any questions or need assistance in obtaining follow up care.    __________________________________________  _____________  __________ Signature of Patient or Authorized Representative            Date                   Time    __________________________________________ Nurse's Signature

## 2012-05-10 NOTE — Patient Instructions (Addendum)
Keep your currently scheduled appointments for lab and chemotherapy Follow up in 4 weeks with labs and chemo the same day Get labs on 05/24/12 at your primary care doctor's office and have the results faxed to Dr. Arbutus Ped as before

## 2012-05-10 NOTE — Telephone Encounter (Signed)
Per AJ on 9.16.13 pof cx 9.30 lab.  All other made and printed for pt.

## 2012-05-11 ENCOUNTER — Encounter: Payer: Self-pay | Admitting: Internal Medicine

## 2012-05-11 NOTE — Progress Notes (Signed)
Philip Shaw reports that he continues to consume milkshakes made with milk, ice cream, fruits and protein powder. This is the mainstay of his diet. He does eat small amounts of food throughout the day depending on his craving. The  patient's weight was documented as 150.8 pounds September 16, which is increased from 147.1 pound back in April.  The patient denies nutrition side affects.  NUTRITION DIAGNOSIS:  Unintended weight loss, improved.  INTERVENTION:  I have encouraged the patient and educated him on ways to increase variety of foods in his diet.  He is to continue shakes as tolerated but work on increasing solid food based on his tolerance and likes and dislikes. The patient agreeable.  MONITORING/EVALUATION/GOALS:  The patient will continue oral intake to promote weight maintenance.  NEXT VISIT:  Monday, October 14th, during chemotherapy.   ______________________________ Zenovia Jarred, RD, CSO, LDN Clinical Nutrition Specialist BN/MEDQ  D:  05/10/2012  T:  05/11/2012  Job:  [id number]

## 2012-05-12 MED ORDER — GABAPENTIN 100 MG PO CAPS: 100.0000 mg | ORAL_CAPSULE | Freq: Three times a day (TID) | ORAL | Status: DC

## 2012-05-15 NOTE — Progress Notes (Signed)
[facility] [phone]   [phone]) [phone]  OFFICE PROGRESS NOTE  [name], MD Dr. Sharman Crate [place] [address] [place] [address]  DIAGNOSIS: Metastatic pancreatic adenocarcinoma, now with metastatic disease to the liver and the abdominal wall, initially diagnosed with stage IIIB(T3, N1, M0) pancreatic tail adenocarcinoma in November of 2011.   PRIOR THERAPY:  #1 distal pancreatectomy and splenectomy on 06/25/2010  #2 adjuvant chemotherapy with gemcitabine started 08/20/2010 and completed 2 cycles on 12/02/2010.  #3 status post concurrent chemoradiation with Xeloda from 12/15/2010 on 10 01/24/2011.  #4 status post 2 more cycles of adjuvant gemcitabine with gemcitabine completed on 04/04/2011.  #5 FOLFOX giving every 2 weeks status post 12 cycles.  #6 Continuous infusion 5-FU and leucovorin every 2 weeks. Status post 4 cycles discontinued today secondary to disease progression.  CURRENT THERAPY: The patient was started on 05/03/2012 the first cycle of systemic chemotherapy with gemcitabine 800 mg/M2 in addition to Abraxane 80 mg/M2 on weekly basis for 3 out of every 4 weeks.  INTERVAL HISTORY: Philip Shaw 77 y.o. male returns to the clinic today for followup visit accompanied by his wife. The patient is feeling fine with no specific complaints except for mild fatigue. He notes increased numbness in his fingers. He states that is fine motor coordination/dexterity is slower but is still functional. He continues to have his weekly labs drawn by his primary care physician with results faxed to [place]. The patient denied having any significant chest pain, shortness breath, cough or hemoptysis. He denied having any significant weight loss or night sweats.   MEDICAL HISTORY: Past Medical History  Diagnosis Date  . Heart murmur   . Abdominal pain, periumbilical   . Constipation   . Diabetes mellitus   . Hypertension   . Sleep apnea     pt  states he no longer uses his cpap - lost weight  . pancreatic ca dx'd 04/2010    METASTATIC PANCREATIC CA WITH METS TO LIVER AND ABDOMINAL WALL  -chemo every 2 weeks - ONCOLOGIST DR. MOHAMED MOHAMED WITH [facility].  Marland Kitchen Port-a-cath in place     RIGHT UPPER CHEST - FOR CHEMO EVERY 2 WEEKS    ALLERGIES:   has no known allergies.  MEDICATIONS:  Current Outpatient Prescriptions  Medication Sig Dispense Refill  . Alum & Mag Hydroxide-Simeth (MAGIC MOUTHWASH) SOLN Take 5 mLs by mouth QID.  240 mL  0  . aspirin 81 MG tablet Take 81 mg by mouth every morning.       . gabapentin (NEURONTIN) 100 MG capsule Take 1 capsule (100 mg total) by mouth 3 (three) times daily.  90 capsule  1  . insulin glargine (LANTUS) 100 UNIT/ML injection Inject into the skin at bedtime. 32 units every night except week of chemo pt would increase to 37      . lidocaine-prilocaine (EMLA) cream Apply 1 application topically as needed.  30 g  2  . magnesium hydroxide (MILK OF MAGNESIA) 400 MG/5ML suspension Take by mouth daily.       . methocarbamol (ROBAXIN) 500 MG tablet       . mirtazapine (REMERON) 30 MG tablet Take 1 tablet (30 mg total) by mouth at bedtime.  90 tablet  0  . ondansetron (ZOFRAN) 8 MG tablet Take 1 tablet (8 mg total) by mouth every 12 (twelve) hours as needed for nausea.  60 tablet  2  . oxyCODONE (OXY IR/ROXICODONE) 5  MG immediate release tablet Take 1 tablet (5 mg total) by mouth every 4 (four) hours as needed.  30 tablet  0  . oxyCODONE (OXYCONTIN) 10 MG 12 hr tablet Take 1 tablet (10 mg total) by mouth every 12 (twelve) hours.  60 tablet  0  . oxyCODONE-acetaminophen (PERCOCET) 10-325 MG per tablet       . pravastatin (PRAVACHOL) 20 MG tablet Take 20 mg by mouth at bedtime.       . ranitidine (ZANTAC) 150 MG tablet Take 150 mg by mouth 2 (two) times daily.       . Tamsulosin HCl (FLOMAX) 0.4 MG CAPS Once or twice daily      . terazosin (HYTRIN) 2 MG capsule Take 2 mg by mouth 2 (two)  times daily.       Marland Kitchen DISCONTD: metFORMIN (GLUCOPHAGE) 850 MG tablet Take 850 mg by mouth 2 (two) times daily with a meal.         SURGICAL HISTORY:  Past Surgical History  Procedure Date  . Appendectomy 15-27 years old  . Pancreatectomy 06/2010    with splenectomy  . Inguinal hernia repair as infant, age 54    LIH- infant, RIH age 49  . Knee arthroscopy 04/23/2012    Procedure: ARTHROSCOPY KNEE;  Surgeon: Loanne Drilling, MD;  Location: [facility];  Service: Orthopedics;  Laterality: Right;  with Debridement    REVIEW OF SYSTEMS:  A comprehensive review of systems was negative except for: Constitutional: positive for fatigue Gastrointestinal: positive for Intermittent abdominal pain Neurological: positive for paresthesia   PHYSICAL EXAMINATION: General appearance: alert, cooperative and no distress Head: Normocephalic, without obvious abnormality, atraumatic Neck: no adenopathy Lymph nodes: Cervical, supraclavicular, and axillary nodes normal. Resp: clear to auscultation bilaterally Cardio: regular rate and rhythm, S1, S2 normal, no murmur, click, rub or gallop GI: soft, non-tender; bowel sounds normal; no masses,  no organomegaly Extremities: extremities normal, atraumatic, no cyanosis or edema Neurologic: Alert and oriented X 3, normal strength and tone. Normal symmetric reflexes. Normal coordination and gait  ECOG PERFORMANCE STATUS: 1 - Symptomatic but completely ambulatory  Blood pressure 120/72, pulse 90, temperature 97.3 F (36.3 C), resp. rate 20, height 5\' 7"  (1.702 m), weight 150 lb 12.8 oz (68.402 kg).  LABORATORY DATA: Lab Results  Component Value Date   WBC 3.7* 05/10/2012   HGB 9.6* 05/10/2012   HCT 28.8* 05/10/2012   MCV 99.3* 05/10/2012   PLT 272 05/10/2012      Chemistry      Component Value Date/Time   NA 136 05/10/2012 1101   NA 136 04/19/2012 0930   K 4.4 05/10/2012 1101   K 4.3 04/19/2012 0930   CL 104 05/10/2012 1101   CL 100 04/19/2012 0930   CO2 26 05/10/2012  1101   CO2 28 04/19/2012 0930   BUN 23.0 05/10/2012 1101   BUN 18 04/19/2012 0930   CREATININE 0.7 05/10/2012 1101   CREATININE 0.76 04/19/2012 0930      Component Value Date/Time   CALCIUM 9.1 05/10/2012 1101   CALCIUM 9.2 04/19/2012 0930   ALKPHOS 237* 05/10/2012 1101   ALKPHOS 249* 04/19/2012 0930   AST 29 05/10/2012 1101   AST 45* 04/19/2012 0930   ALT 30 05/10/2012 1101   ALT 42 04/19/2012 0930   BILITOT 0.50 05/10/2012 1101   BILITOT 0.6 04/19/2012 0930       RADIOGRAPHIC STUDIES: Ct Chest W Contrast  04/12/2012  *RADIOLOGY REPORT*  Clinical Data:  Metastatic pancreas  cancer  CT CHEST, ABDOMEN AND PELVIS WITH CONTRAST  Technique:  Multidetector CT imaging of the chest, abdomen and pelvis was performed following the standard protocol during bolus administration of intravenous contrast.  Contrast: OMNIPAQUE IOHEXOL 300 MG/ML  SOLN  Comparison:  01/22/2012   CT CHEST  Findings:  There is no axillary or supraclavicular adenopathy.  There are no enlarged mediastinal or hilar lymph nodes.  Calcified atherosclerotic disease is noted involving the LAD coronary artery. No pericardial or pleural effusion noted.  The lungs are clear.  No suspicious pulmonary nodule or mass noted.  Similar appearance of fracture involving the inferior aspect of the right scapula.  Underlying lucent lesion is suspected.  No other aggressive bone lesions are identified.  IMPRESSION:  1.  No acute findings and no evidence for pulmonary metastatic disease. 2.  Similar appearance of pathologic fracture involving the inferior aspect of the right scapula.  Concerning for bone metastasis.   CT ABDOMEN AND PELVIS  Findings:  Metastatic lesion within the left hepatic lobe with peripheral enhancement measures 2.3 cm, image 17.  This is increased from 1.4 cm previously.  Along the dome of the liver there is a small area of low density which measures 8.4 mm, image 10.  This is new from previous exam and is suspicious for metastatic  disease.  Pneumobilia is identified consistent with biliary patency.  New from previous exam.  This may reflect interval instrumentation. High density material is noted layering along the dependent portion of the gallbladder, also new from previous exam.  This may reflect refluxed contrast material.  Less likely interval development of gallstones.   Calcifications involving the head and neck of the pancreas are noted compatible with chronic pancreatitis.   Ectasia of the pancreatic duct within the head and neck of the pancreas is noted.  There are postoperative changes of distal pancreatectomy. This is similar to previous exam.  Prior splenectomy.  The adrenal glands are both normal.  Nonobstructing calculi are noted within the inferior poles of both kidneys.  Similar appearance of the bilateral renal cysts. Complex septated cyst arising from the inferior pole of the right kidney is unchanged from previous examination measuring 2.1 x 1.5 cm, image 79. Urinary bladder is unremarkable.  Enlargement of the prostate gland is again noted.  The seminal vesicles appear normal.  There is mild soft tissue stranding which encases the superior mesenteric artery.  This is unchanged from previous exam, image 81. No metastatic adenopathy noted within the upper abdomen.  There is no pelvic or inguinal adenopathy.  The stomach appears normal.  The proximal and mid small bowel loops are increased in caliber measuring up to 3.2 cm, image 88.  The distal small bowel loops and terminal ileum have a normal caliber. No obstructing colonic mass noted.  The colon appears normal.  Evidence of transperitoneal spread of tumor is again identified. Tumor nodule along the undersurface of the ventral abdominal wall measures 1.8 cm, image 65.  Stable from previous exam.  Right lower quadrant tumor deposit is identified measuring 2.0 cm, image 95. Previously 2.1 cm.  Left upper quadrant tumor is noted measuring 2.8 by 4.3 cm, image 60.  Stable from  previous exam.  Review of the visualized osseous structures shows lumbar degenerative disc disease.  IMPRESSION:  1.  Increasing size of the liver metastasis. 2.  Peritoneal disease is stable when compared with previous exam. 3.  Increased caliber of the proximal and mid small bowel loops with normal  caliber distal small bowel.  Suspicious for partial obstruction. 4.  Bilateral nonobstructing renal calculi. 5.  Stable complex septated cyst involving the lower pole of the right kidney.   Original Report Authenticated By: Rosealee Albee, M.D.     ASSESSMENT/PLAN: This is a very pleasant 77 years old white male with metastatic pancreatic adenocarcinoma status post several chemotherapy regimens most recently with her continuous infusion 5-FU and leucovorin but unfortunately the patient has evidence for disease progression especially in the liver. He is currently being treated with systemic chemotherapy in the form of gemcitabine at 800 mg per meter squared and Abraxane at 80 mg read square given 3 weeks out of every 4 weeks status post 1 week of therapy. The patient was discussed with Dr. Arbutus Ped. To address his complaints of increased peripheral neuropathy in his fingers will start him on Neurontin 100 mg by mouth 3 times daily. Prescription for 90 capsules with 1 refill was sent to his pharmacy of record. He'll return in one month with a repeat CBC differential and C. met prior to the beginning of his second cycle of systemic chemotherapy with gemcitabine and Abraxane.  Laural Benes, Asra Gambrel E, PA-C   All questions were answered. The patient knows to call the clinic with any problems, questions or concerns. We can certainly see the patient much sooner if necessary.  I spent 20 minutes counseling the patient face to face. The total time spent in the appointment was 30 minutes.

## 2012-05-17 ENCOUNTER — Other Ambulatory Visit (HOSPITAL_BASED_OUTPATIENT_CLINIC_OR_DEPARTMENT_OTHER): Payer: Medicare Other | Admitting: Lab

## 2012-05-17 ENCOUNTER — Ambulatory Visit (HOSPITAL_BASED_OUTPATIENT_CLINIC_OR_DEPARTMENT_OTHER): Payer: Medicare Other

## 2012-05-17 VITALS — BP 129/77 | HR 85 | Temp 97.4°F

## 2012-05-17 DIAGNOSIS — C787 Secondary malignant neoplasm of liver and intrahepatic bile duct: Secondary | ICD-10-CM

## 2012-05-17 DIAGNOSIS — C252 Malignant neoplasm of tail of pancreas: Secondary | ICD-10-CM

## 2012-05-17 DIAGNOSIS — C259 Malignant neoplasm of pancreas, unspecified: Secondary | ICD-10-CM

## 2012-05-17 DIAGNOSIS — Z5111 Encounter for antineoplastic chemotherapy: Secondary | ICD-10-CM

## 2012-05-17 LAB — COMPREHENSIVE METABOLIC PANEL (CC13)
Albumin: 3.1 g/dL — ABNORMAL LOW (ref 3.5–5.0)
BUN: 14 mg/dL (ref 7.0–26.0)
CO2: 24 mEq/L (ref 22–29)
Calcium: 9.2 mg/dL (ref 8.4–10.4)
Chloride: 103 mEq/L (ref 98–107)
Glucose: 100 mg/dl — ABNORMAL HIGH (ref 70–99)
Potassium: 4.3 mEq/L (ref 3.5–5.1)
Total Protein: 6.2 g/dL — ABNORMAL LOW (ref 6.4–8.3)

## 2012-05-17 LAB — CBC WITH DIFFERENTIAL/PLATELET
Basophils Absolute: 0 10*3/uL (ref 0.0–0.1)
Eosinophils Absolute: 0.2 10*3/uL (ref 0.0–0.5)
HCT: 27.1 % — ABNORMAL LOW (ref 38.4–49.9)
HGB: 9.1 g/dL — ABNORMAL LOW (ref 13.0–17.1)
MCH: 32.6 pg (ref 27.2–33.4)
MONO#: 0.7 10*3/uL (ref 0.1–0.9)
NEUT#: 2.1 10*3/uL (ref 1.5–6.5)
NEUT%: 53.3 % (ref 39.0–75.0)
WBC: 3.9 10*3/uL — ABNORMAL LOW (ref 4.0–10.3)
lymph#: 0.9 10*3/uL (ref 0.9–3.3)

## 2012-05-17 LAB — MAGNESIUM (CC13): Magnesium: 1.7 mg/dl (ref 1.5–2.5)

## 2012-05-17 MED ORDER — PACLITAXEL PROTEIN-BOUND CHEMO INJECTION 100 MG
80.0000 mg/m2 | Freq: Once | INTRAVENOUS | Status: AC
  Administered 2012-05-17: 150 mg via INTRAVENOUS
  Filled 2012-05-17: qty 30

## 2012-05-17 MED ORDER — SODIUM CHLORIDE 0.9 % IV SOLN
800.0000 mg/m2 | Freq: Once | INTRAVENOUS | Status: AC
  Administered 2012-05-17: 1444 mg via INTRAVENOUS
  Filled 2012-05-17: qty 38

## 2012-05-17 MED ORDER — PROCHLORPERAZINE MALEATE 10 MG PO TABS
10.0000 mg | ORAL_TABLET | Freq: Once | ORAL | Status: AC
  Administered 2012-05-17: 10 mg via ORAL

## 2012-05-17 MED ORDER — SODIUM CHLORIDE 0.9 % IJ SOLN
10.0000 mL | INTRAMUSCULAR | Status: DC | PRN
  Administered 2012-05-17: 10 mL
  Filled 2012-05-17: qty 10

## 2012-05-17 MED ORDER — SODIUM CHLORIDE 0.9 % IV SOLN
Freq: Once | INTRAVENOUS | Status: AC
  Administered 2012-05-17: 09:00:00 via INTRAVENOUS

## 2012-05-17 MED ORDER — HEPARIN SOD (PORK) LOCK FLUSH 100 UNIT/ML IV SOLN
500.0000 [IU] | Freq: Once | INTRAVENOUS | Status: AC | PRN
  Administered 2012-05-17: 500 [IU]
  Filled 2012-05-17: qty 5

## 2012-05-17 NOTE — Patient Instructions (Addendum)
[facility] Discharge Instructions for Patients Receiving Chemotherapy  Today you received the following chemotherapy agents Abraxane/ Gemzar  To help prevent nausea and vomiting after your treatment, we encourage you to take your nausea medication  and take it as often as prescribed    If you develop nausea and vomiting that is not controlled by your nausea medication, call the clinic. If it is after clinic hours your family physician or the after hours number for the clinic or go to the Emergency Department.   BELOW ARE SYMPTOMS THAT SHOULD BE REPORTED IMMEDIATELY:  *FEVER GREATER THAN 100.5 F  *CHILLS WITH OR WITHOUT FEVER  NAUSEA AND VOMITING THAT IS NOT CONTROLLED WITH YOUR NAUSEA MEDICATION  *UNUSUAL SHORTNESS OF BREATH  *UNUSUAL BRUISING OR BLEEDING  TENDERNESS IN MOUTH AND THROAT WITH OR WITHOUT PRESENCE OF ULCERS  *URINARY PROBLEMS  *BOWEL PROBLEMS  UNUSUAL RASH Items with * indicate a potential emergency and should be followed up as soon as possible.  One of the nurses will contact you 24 hours after your treatment. Please let the nurse know about any problems that you may have experienced. Feel free to call the clinic you have any questions or concerns. The clinic phone number is [phone].   I have been informed and understand all the instructions given to me. I know to contact the clinic, my physician, or go to the Emergency Department if any problems should occur. I do not have any questions at this time, but understand that I may call the clinic during office hours   should I have any questions or need assistance in obtaining follow up care.    __________________________________________  _____________  __________ Signature of Patient or Authorized Representative            Date                   Time    __________________________________________ Nurse's Signature

## 2012-05-24 ENCOUNTER — Other Ambulatory Visit: Payer: Medicare Other | Admitting: Lab

## 2012-05-31 ENCOUNTER — Ambulatory Visit (HOSPITAL_BASED_OUTPATIENT_CLINIC_OR_DEPARTMENT_OTHER): Payer: Medicare Other

## 2012-05-31 ENCOUNTER — Other Ambulatory Visit (HOSPITAL_BASED_OUTPATIENT_CLINIC_OR_DEPARTMENT_OTHER): Payer: Medicare Other | Admitting: Lab

## 2012-05-31 VITALS — BP 115/75 | HR 83 | Temp 97.0°F

## 2012-05-31 DIAGNOSIS — Z5111 Encounter for antineoplastic chemotherapy: Secondary | ICD-10-CM

## 2012-05-31 DIAGNOSIS — C259 Malignant neoplasm of pancreas, unspecified: Secondary | ICD-10-CM

## 2012-05-31 DIAGNOSIS — C252 Malignant neoplasm of tail of pancreas: Secondary | ICD-10-CM

## 2012-05-31 DIAGNOSIS — C787 Secondary malignant neoplasm of liver and intrahepatic bile duct: Secondary | ICD-10-CM

## 2012-05-31 LAB — CBC WITH DIFFERENTIAL/PLATELET
BASO%: 0.6 % (ref 0.0–2.0)
MCHC: 33.7 g/dL (ref 32.0–36.0)
MONO#: 1.3 10*3/uL — ABNORMAL HIGH (ref 0.1–0.9)
RBC: 2.96 10*6/uL — ABNORMAL LOW (ref 4.20–5.82)
WBC: 4.9 10*3/uL (ref 4.0–10.3)
lymph#: 1 10*3/uL (ref 0.9–3.3)

## 2012-05-31 LAB — COMPREHENSIVE METABOLIC PANEL (CC13)
ALT: 27 U/L (ref 0–55)
AST: 27 U/L (ref 5–34)
Chloride: 107 mEq/L (ref 98–107)
Creatinine: 0.8 mg/dL (ref 0.7–1.3)
Sodium: 138 mEq/L (ref 136–145)
Total Bilirubin: 0.6 mg/dL (ref 0.20–1.20)
Total Protein: 6.4 g/dL (ref 6.4–8.3)

## 2012-05-31 MED ORDER — SODIUM CHLORIDE 0.9 % IV SOLN
Freq: Once | INTRAVENOUS | Status: AC
  Administered 2012-05-31: 09:00:00 via INTRAVENOUS

## 2012-05-31 MED ORDER — SODIUM CHLORIDE 0.9 % IV SOLN
800.0000 mg/m2 | Freq: Once | INTRAVENOUS | Status: AC
  Administered 2012-05-31: 1444 mg via INTRAVENOUS
  Filled 2012-05-31: qty 38

## 2012-05-31 MED ORDER — SODIUM CHLORIDE 0.9 % IJ SOLN
10.0000 mL | INTRAMUSCULAR | Status: DC | PRN
  Administered 2012-05-31: 10 mL
  Filled 2012-05-31: qty 10

## 2012-05-31 MED ORDER — HEPARIN SOD (PORK) LOCK FLUSH 100 UNIT/ML IV SOLN
500.0000 [IU] | Freq: Once | INTRAVENOUS | Status: AC | PRN
  Administered 2012-05-31: 500 [IU]
  Filled 2012-05-31: qty 5

## 2012-05-31 MED ORDER — PACLITAXEL PROTEIN-BOUND CHEMO INJECTION 100 MG
80.0000 mg/m2 | Freq: Once | INTRAVENOUS | Status: AC
  Administered 2012-05-31: 150 mg via INTRAVENOUS
  Filled 2012-05-31: qty 30

## 2012-05-31 MED ORDER — PROCHLORPERAZINE MALEATE 10 MG PO TABS
10.0000 mg | ORAL_TABLET | Freq: Once | ORAL | Status: AC
  Administered 2012-05-31: 10 mg via ORAL

## 2012-05-31 NOTE — Patient Instructions (Signed)
[facility] Discharge Instructions for Patients Receiving Chemotherapy  Today you received the following chemotherapy agents Abraxane/Gemzar To help prevent nausea and vomiting after your treatment, we encourage you to take your nausea medication as prescribed. If you develop nausea and vomiting that is not controlled by your nausea medication, call the clinic. If it is after clinic hours your family physician or the after hours number for the clinic or go to the Emergency Department.   BELOW ARE SYMPTOMS THAT SHOULD BE REPORTED IMMEDIATELY:  *FEVER GREATER THAN 100.5 F  *CHILLS WITH OR WITHOUT FEVER  NAUSEA AND VOMITING THAT IS NOT CONTROLLED WITH YOUR NAUSEA MEDICATION  *UNUSUAL SHORTNESS OF BREATH  *UNUSUAL BRUISING OR BLEEDING  TENDERNESS IN MOUTH AND THROAT WITH OR WITHOUT PRESENCE OF ULCERS  *URINARY PROBLEMS  *BOWEL PROBLEMS  UNUSUAL RASH Items with * indicate a potential emergency and should be followed up as soon as possible.  One of the nurses will contact you 24 hours after your treatment. Please let the nurse know about any problems that you may have experienced. Feel free to call the clinic you have any questions or concerns. The clinic phone number is [phone].   I have been informed and understand all the instructions given to me. I know to contact the clinic, my physician, or go to the Emergency Department if any problems should occur. I do not have any questions at this time, but understand that I may call the clinic during office hours   should I have any questions or need assistance in obtaining follow up care.    __________________________________________  _____________  __________ Signature of Patient or Authorized Representative            Date                   Time    __________________________________________ Nurse's Signature    

## 2012-06-07 ENCOUNTER — Ambulatory Visit (HOSPITAL_BASED_OUTPATIENT_CLINIC_OR_DEPARTMENT_OTHER): Payer: Medicare Other | Admitting: Physician Assistant

## 2012-06-07 ENCOUNTER — Ambulatory Visit: Payer: Medicare Other | Admitting: Nutrition

## 2012-06-07 ENCOUNTER — Other Ambulatory Visit (HOSPITAL_BASED_OUTPATIENT_CLINIC_OR_DEPARTMENT_OTHER): Payer: Medicare Other | Admitting: Lab

## 2012-06-07 ENCOUNTER — Other Ambulatory Visit: Payer: Self-pay | Admitting: *Deleted

## 2012-06-07 ENCOUNTER — Telehealth: Payer: Self-pay | Admitting: Internal Medicine

## 2012-06-07 ENCOUNTER — Telehealth: Payer: Self-pay | Admitting: *Deleted

## 2012-06-07 ENCOUNTER — Encounter: Payer: Self-pay | Admitting: Physician Assistant

## 2012-06-07 ENCOUNTER — Ambulatory Visit (HOSPITAL_BASED_OUTPATIENT_CLINIC_OR_DEPARTMENT_OTHER): Payer: Medicare Other

## 2012-06-07 VITALS — BP 131/77 | HR 84 | Temp 97.2°F | Resp 20 | Ht 67.0 in | Wt 156.6 lb

## 2012-06-07 DIAGNOSIS — Z5111 Encounter for antineoplastic chemotherapy: Secondary | ICD-10-CM

## 2012-06-07 DIAGNOSIS — G609 Hereditary and idiopathic neuropathy, unspecified: Secondary | ICD-10-CM

## 2012-06-07 DIAGNOSIS — C259 Malignant neoplasm of pancreas, unspecified: Secondary | ICD-10-CM

## 2012-06-07 DIAGNOSIS — C252 Malignant neoplasm of tail of pancreas: Secondary | ICD-10-CM

## 2012-06-07 DIAGNOSIS — C787 Secondary malignant neoplasm of liver and intrahepatic bile duct: Secondary | ICD-10-CM

## 2012-06-07 DIAGNOSIS — C779 Secondary and unspecified malignant neoplasm of lymph node, unspecified: Secondary | ICD-10-CM

## 2012-06-07 LAB — CBC WITH DIFFERENTIAL/PLATELET
Basophils Absolute: 0 10*3/uL (ref 0.0–0.1)
EOS%: 2.4 % (ref 0.0–7.0)
HCT: 27 % — ABNORMAL LOW (ref 38.4–49.9)
HGB: 8.9 g/dL — ABNORMAL LOW (ref 13.0–17.1)
MCH: 32 pg (ref 27.2–33.4)
MONO#: 0.8 10*3/uL (ref 0.1–0.9)
NEUT#: 1.9 10*3/uL (ref 1.5–6.5)
NEUT%: 45.5 % (ref 39.0–75.0)
RDW: 16.7 % — ABNORMAL HIGH (ref 11.0–14.6)
WBC: 4.1 10*3/uL (ref 4.0–10.3)
lymph#: 1.3 10*3/uL (ref 0.9–3.3)

## 2012-06-07 LAB — COMPREHENSIVE METABOLIC PANEL (CC13)
AST: 34 U/L (ref 5–34)
Albumin: 3.4 g/dL — ABNORMAL LOW (ref 3.5–5.0)
Alkaline Phosphatase: 215 U/L — ABNORMAL HIGH (ref 40–150)
Calcium: 9.6 mg/dL (ref 8.4–10.4)
Chloride: 106 mEq/L (ref 98–107)
Glucose: 87 mg/dl (ref 70–99)
Potassium: 4.4 mEq/L (ref 3.5–5.1)
Sodium: 138 mEq/L (ref 136–145)
Total Protein: 6.5 g/dL (ref 6.4–8.3)

## 2012-06-07 MED ORDER — MIRTAZAPINE 30 MG PO TABS: 30.0000 mg | ORAL_TABLET | Freq: Every day | ORAL | Status: DC

## 2012-06-07 MED ORDER — PACLITAXEL PROTEIN-BOUND CHEMO INJECTION 100 MG
80.0000 mg/m2 | Freq: Once | INTRAVENOUS | Status: AC
  Administered 2012-06-07: 150 mg via INTRAVENOUS
  Filled 2012-06-07: qty 30

## 2012-06-07 MED ORDER — SODIUM CHLORIDE 0.9 % IV SOLN
800.0000 mg/m2 | Freq: Once | INTRAVENOUS | Status: AC
  Administered 2012-06-07: 1444 mg via INTRAVENOUS
  Filled 2012-06-07: qty 38

## 2012-06-07 MED ORDER — SODIUM CHLORIDE 0.9 % IV SOLN
Freq: Once | INTRAVENOUS | Status: AC
  Administered 2012-06-07: 10:00:00 via INTRAVENOUS

## 2012-06-07 MED ORDER — PROCHLORPERAZINE MALEATE 10 MG PO TABS
10.0000 mg | ORAL_TABLET | Freq: Once | ORAL | Status: AC
  Administered 2012-06-07: 10 mg via ORAL

## 2012-06-07 NOTE — Progress Notes (Signed)
[facility] [phone]   [phone]  OFFICE PROGRESS NOTE  [name], MD Dr. Sharman Crate Hamrick [address] [place] [address]  DIAGNOSIS: Metastatic pancreatic adenocarcinoma, now with metastatic disease to the liver and the abdominal wall, initially diagnosed with stage IIIB(T3, N1, M0) pancreatic tail adenocarcinoma in November of 2011.   PRIOR THERAPY:  #1 distal pancreatectomy and splenectomy on 06/25/2010  #2 adjuvant chemotherapy with gemcitabine started 08/20/2010 and completed 2 cycles on 12/02/2010.  #3 status post concurrent chemoradiation with Xeloda from 12/15/2010 on 10 01/24/2011.  #4 status post 2 more cycles of adjuvant gemcitabine with gemcitabine completed on 04/04/2011.  #5 FOLFOX giving every 2 weeks status post 12 cycles.  #6 Continuous infusion 5-FU and leucovorin every 2 weeks. Status post 4 cycles discontinued today secondary to disease progression.  CURRENT THERAPY: The patient was started on 05/03/2012 the first cycle of systemic chemotherapy with gemcitabine 800 mg/M2 in addition to Abraxane 80 mg/M2 on weekly basis for 3 out of every 4 weeks. Status post 1 cycle as well as day 1 of cycle 2  INTERVAL HISTORY: [employer]IVERS 77 y.o. male returns to the clinic today for followup visit accompanied by his wife. The patient is feeling fine with no specific complaints except for mild fatigue. He reports the Neurontin has been helpful with his peripheral neuropathy. He will get this medication refilled. His appetite has improved and he has actually gained some weight. He is resting well. He voices no specific complaints today. He continues to tolerate his systemic chemotherapy with gemcitabine and Abraxane relatively well. He continues to have his weekly labs drawn by his primary care physician with results faxed to [place]. The patient denied having any significant chest pain, shortness breath, cough or hemoptysis. He  denied having any significant weight loss or night sweats. The patient was offered a flulike in today but he would prefer to wait. He is due for an exam at the [place] in the beginning of November and feels that he will likely receive the vaccine at that visit.  MEDICAL HISTORY: Past Medical History  Diagnosis Date  . Heart murmur   . Abdominal pain, periumbilical   . Constipation   . Diabetes mellitus   . Hypertension   . Sleep apnea     pt states he no longer uses his cpap - lost weight  . pancreatic ca dx'd 04/2010    METASTATIC PANCREATIC CA WITH METS TO LIVER AND ABDOMINAL WALL  -chemo every 2 weeks - ONCOLOGIST DR. MOHAMED MOHAMED WITH [facility].  Marland Kitchen Port-a-cath in place     RIGHT UPPER CHEST - FOR CHEMO EVERY 2 WEEKS    ALLERGIES:   has no known allergies.  MEDICATIONS:  Current Outpatient Prescriptions  Medication Sig Dispense Refill  . Alum & Mag Hydroxide-Simeth (MAGIC MOUTHWASH) SOLN Take 5 mLs by mouth QID.  240 mL  0  . aspirin 81 MG tablet Take 81 mg by mouth every morning.       . gabapentin (NEURONTIN) 100 MG capsule Take 1 capsule (100 mg total) by mouth 3 (three) times daily.  90 capsule  1  . insulin glargine (LANTUS) 100 UNIT/ML injection Inject into the skin at bedtime. 32 units every night except week of chemo pt would increase to 37      . lidocaine-prilocaine (EMLA) cream Apply 1 application topically as needed.  30 g  2  . magnesium hydroxide (MILK  OF MAGNESIA) 400 MG/5ML suspension Take by mouth daily.       . methocarbamol (ROBAXIN) 500 MG tablet       . mirtazapine (REMERON) 30 MG tablet Take 1 tablet (30 mg total) by mouth at bedtime.  90 tablet  0  . ondansetron (ZOFRAN) 8 MG tablet Take 1 tablet (8 mg total) by mouth every 12 (twelve) hours as needed for nausea.  60 tablet  2  . oxyCODONE (OXY IR/ROXICODONE) 5 MG immediate release tablet Take 1 tablet (5 mg total) by mouth every 4 (four) hours as needed.  30 tablet  0  . oxyCODONE (OXYCONTIN)  10 MG 12 hr tablet Take 1 tablet (10 mg total) by mouth every 12 (twelve) hours.  60 tablet  0  . oxyCODONE-acetaminophen (PERCOCET) 10-325 MG per tablet       . pravastatin (PRAVACHOL) 20 MG tablet Take 20 mg by mouth at bedtime.       . ranitidine (ZANTAC) 150 MG tablet Take 150 mg by mouth 2 (two) times daily.       . Tamsulosin HCl (FLOMAX) 0.4 MG CAPS Once or twice daily      . terazosin (HYTRIN) 2 MG capsule Take 2 mg by mouth 2 (two) times daily.       Marland Kitchen DISCONTD: metFORMIN (GLUCOPHAGE) 850 MG tablet Take 850 mg by mouth 2 (two) times daily with a meal.         SURGICAL HISTORY:  Past Surgical History  Procedure Date  . Appendectomy 60-15 years old  . Pancreatectomy 06/2010    with splenectomy  . Inguinal hernia repair as infant, age 51    LIH- infant, RIH age 47  . Knee arthroscopy 04/23/2012    Procedure: ARTHROSCOPY KNEE;  Surgeon: Loanne Drilling, MD;  Location: [facility];  Service: Orthopedics;  Laterality: Right;  with Debridement    REVIEW OF SYSTEMS:  A comprehensive review of systems was negative except for: Constitutional: positive for fatigue Neurological: positive for paresthesia   PHYSICAL EXAMINATION: General appearance: alert, cooperative and no distress Head: Normocephalic, without obvious abnormality, atraumatic Neck: no adenopathy Lymph nodes: Cervical, supraclavicular, and axillary nodes normal. Resp: clear to auscultation bilaterally Cardio: regular rate and rhythm, S1, S2 normal, no murmur, click, rub or gallop GI: soft, non-tender; bowel sounds normal; no masses,  no organomegaly Extremities: extremities normal, atraumatic, no cyanosis or edema Neurologic: Alert and oriented X 3, normal strength and tone. Normal symmetric reflexes. Normal coordination and gait  ECOG PERFORMANCE STATUS: 1 - Symptomatic but completely ambulatory  Blood pressure 131/77, pulse 84, temperature 97.2 F (36.2 C), temperature source Oral, resp. rate 20, height 5\' 7"  (1.702 m),  weight 156 lb 9.6 oz (71.033 kg).  LABORATORY DATA: Lab Results  Component Value Date   WBC 4.1 06/07/2012   HGB 8.9* 06/07/2012   HCT 27.0* 06/07/2012   MCV 97.1 06/07/2012   PLT 310 06/07/2012      Chemistry      Component Value Date/Time   NA 138 05/31/2012 0823   NA 136 04/19/2012 0930   K 4.7 05/31/2012 0823   K 4.3 04/19/2012 0930   CL 107 05/31/2012 0823   CL 100 04/19/2012 0930   CO2 23 05/31/2012 0823   CO2 28 04/19/2012 0930   BUN 18.0 05/31/2012 0823   BUN 18 04/19/2012 0930   CREATININE 0.8 05/31/2012 0823   CREATININE 0.76 04/19/2012 0930      Component Value Date/Time   CALCIUM 8.9  05/31/2012 0823   CALCIUM 9.2 04/19/2012 0930   ALKPHOS 191* 05/31/2012 0823   ALKPHOS 249* 04/19/2012 0930   AST 27 05/31/2012 0823   AST 45* 04/19/2012 0930   ALT 27 05/31/2012 0823   ALT 42 04/19/2012 0930   BILITOT 0.60 05/31/2012 0823   BILITOT 0.6 04/19/2012 0930       RADIOGRAPHIC STUDIES: Ct Chest W Contrast  04/12/2012  *RADIOLOGY REPORT*  Clinical Data:  Metastatic pancreas cancer  CT CHEST, ABDOMEN AND PELVIS WITH CONTRAST  Technique:  Multidetector CT imaging of the chest, abdomen and pelvis was performed following the standard protocol during bolus administration of intravenous contrast.  Contrast: OMNIPAQUE IOHEXOL 300 MG/ML  SOLN  Comparison:  01/22/2012   CT CHEST  Findings:  There is no axillary or supraclavicular adenopathy.  There are no enlarged mediastinal or hilar lymph nodes.  Calcified atherosclerotic disease is noted involving the LAD coronary artery. No pericardial or pleural effusion noted.  The lungs are clear.  No suspicious pulmonary nodule or mass noted.  Similar appearance of fracture involving the inferior aspect of the right scapula.  Underlying lucent lesion is suspected.  No other aggressive bone lesions are identified.  IMPRESSION:  1.  No acute findings and no evidence for pulmonary metastatic disease. 2.  Similar appearance of pathologic fracture involving  the inferior aspect of the right scapula.  Concerning for bone metastasis.   CT ABDOMEN AND PELVIS  Findings:  Metastatic lesion within the left hepatic lobe with peripheral enhancement measures 2.3 cm, image 17.  This is increased from 1.4 cm previously.  Along the dome of the liver there is a small area of low density which measures 8.4 mm, image 10.  This is new from previous exam and is suspicious for metastatic disease.  Pneumobilia is identified consistent with biliary patency.  New from previous exam.  This may reflect interval instrumentation. High density material is noted layering along the dependent portion of the gallbladder, also new from previous exam.  This may reflect refluxed contrast material.  Less likely interval development of gallstones.   Calcifications involving the head and neck of the pancreas are noted compatible with chronic pancreatitis.   Ectasia of the pancreatic duct within the head and neck of the pancreas is noted.  There are postoperative changes of distal pancreatectomy. This is similar to previous exam.  Prior splenectomy.  The adrenal glands are both normal.  Nonobstructing calculi are noted within the inferior poles of both kidneys.  Similar appearance of the bilateral renal cysts. Complex septated cyst arising from the inferior pole of the right kidney is unchanged from previous examination measuring 2.1 x 1.5 cm, image 79. Urinary bladder is unremarkable.  Enlargement of the prostate gland is again noted.  The seminal vesicles appear normal.  There is mild soft tissue stranding which encases the superior mesenteric artery.  This is unchanged from previous exam, image 81. No metastatic adenopathy noted within the upper abdomen.  There is no pelvic or inguinal adenopathy.  The stomach appears normal.  The proximal and mid small bowel loops are increased in caliber measuring up to 3.2 cm, image 88.  The distal small bowel loops and terminal ileum have a normal caliber. No  obstructing colonic mass noted.  The colon appears normal.  Evidence of transperitoneal spread of tumor is again identified. Tumor nodule along the undersurface of the ventral abdominal wall measures 1.8 cm, image 65.  Stable from previous exam.  Right lower quadrant  tumor deposit is identified measuring 2.0 cm, image 95. Previously 2.1 cm.  Left upper quadrant tumor is noted measuring 2.8 by 4.3 cm, image 60.  Stable from previous exam.  Review of the visualized osseous structures shows lumbar degenerative disc disease.  IMPRESSION:  1.  Increasing size of the liver metastasis. 2.  Peritoneal disease is stable when compared with previous exam. 3.  Increased caliber of the proximal and mid small bowel loops with normal caliber distal small bowel.  Suspicious for partial obstruction. 4.  Bilateral nonobstructing renal calculi. 5.  Stable complex septated cyst involving the lower pole of the right kidney.   Original Report Authenticated By: Rosealee Albee, M.D.     ASSESSMENT/PLAN: This is a very pleasant 77 years old white male with metastatic pancreatic adenocarcinoma status post several chemotherapy regimens most recently with her continuous infusion 5-FU and leucovorin but unfortunately the patient has evidence for disease progression especially in the liver. He is currently being treated with systemic chemotherapy in the form of gemcitabine at 800 mg per meter squared and Abraxane at 80 mg read square given 3 weeks out of every 4 weeks status post 1 cycle as well as day 1 of cycle 2.. The patient was discussed with Dr. Arbutus Ped. He will complete cycle 2 of his systemic chemotherapy with gemcitabine and Abraxane as scheduled. He will followup with Dr. Arbutus Ped in 3 weeks with a repeat CT of the chest abdomen and pelvis with contrast to reevaluate his disease. We'll continue him on Neurontin 100 mg by mouth 3 times daily and this will be reevaluated when he follows up with Dr. Arbutus Ped in 3 weeks.    Laural Benes,  Shyloh Derosa E, PA-C   All questions were answered. The patient knows to call the clinic with any problems, questions or concerns. We can certainly see the patient much sooner if necessary.  I spent 20 minutes counseling the patient face to face. The total time spent in the appointment was 30 minutes.

## 2012-06-07 NOTE — Progress Notes (Signed)
Patient reports doing well. His weight has increased to 156.6 pounds on October 14. He continues to consume milkshakes made with milk, fruit and ice cream along with protein powder. He is trying to eat more food. He denies nutrition side effects.  Nutrition diagnosis: Unintended weight loss improved.  Intervention: I have educated the patient and his wife to continue higher calorie higher protein shakes throughout the day as tolerated. I will provide samples of Ensure Plus for patient to take with him today. I have also provided recipes for him to refer to as needed.  Monitoring, evaluation, goals: The patient will continue oral intake as tolerated to promote weight maintenance.  Next visit: Monday, November 11, during chemotherapy.

## 2012-06-07 NOTE — Patient Instructions (Addendum)
Continue weekly labs  Continue your chemotherapy as scheduled Continue Neurontin 100 mg by mouth 3 times daily for your peripheral neuropathy Follow up with Dr. Arbutus Ped in 3 weeks with a restaging CT scan of your chest, abdomen and pelvis to re-evaluate your disease

## 2012-06-07 NOTE — Telephone Encounter (Signed)
Per staff phone call and POF I have scheduled appts. [name]  

## 2012-06-07 NOTE — Telephone Encounter (Signed)
appt made and printed for pt aom °

## 2012-06-14 ENCOUNTER — Ambulatory Visit (HOSPITAL_BASED_OUTPATIENT_CLINIC_OR_DEPARTMENT_OTHER): Payer: Medicare Other

## 2012-06-14 ENCOUNTER — Other Ambulatory Visit (HOSPITAL_BASED_OUTPATIENT_CLINIC_OR_DEPARTMENT_OTHER): Payer: Medicare Other | Admitting: Lab

## 2012-06-14 VITALS — BP 137/71 | HR 93 | Temp 97.7°F | Resp 20

## 2012-06-14 DIAGNOSIS — C787 Secondary malignant neoplasm of liver and intrahepatic bile duct: Secondary | ICD-10-CM

## 2012-06-14 DIAGNOSIS — C252 Malignant neoplasm of tail of pancreas: Secondary | ICD-10-CM

## 2012-06-14 DIAGNOSIS — C259 Malignant neoplasm of pancreas, unspecified: Secondary | ICD-10-CM

## 2012-06-14 DIAGNOSIS — Z5111 Encounter for antineoplastic chemotherapy: Secondary | ICD-10-CM

## 2012-06-14 LAB — COMPREHENSIVE METABOLIC PANEL (CC13)
CO2: 22 mEq/L (ref 22–29)
Glucose: 93 mg/dl (ref 70–99)
Sodium: 139 mEq/L (ref 136–145)
Total Bilirubin: 0.5 mg/dL (ref 0.20–1.20)
Total Protein: 6.2 g/dL — ABNORMAL LOW (ref 6.4–8.3)

## 2012-06-14 LAB — CBC WITH DIFFERENTIAL/PLATELET
EOS%: 3.7 % (ref 0.0–7.0)
MCH: 32.1 pg (ref 27.2–33.4)
MCV: 95.5 fL (ref 79.3–98.0)
MONO%: 17.3 % — ABNORMAL HIGH (ref 0.0–14.0)
NEUT#: 3.2 10*3/uL (ref 1.5–6.5)
RBC: 2.65 10*6/uL — ABNORMAL LOW (ref 4.20–5.82)
RDW: 17.3 % — ABNORMAL HIGH (ref 11.0–14.6)
lymph#: 0.8 10*3/uL — ABNORMAL LOW (ref 0.9–3.3)
nRBC: 1 % — ABNORMAL HIGH (ref 0–0)

## 2012-06-14 MED ORDER — SODIUM CHLORIDE 0.9 % IV SOLN
800.0000 mg/m2 | Freq: Once | INTRAVENOUS | Status: AC
  Administered 2012-06-14: 1444 mg via INTRAVENOUS
  Filled 2012-06-14: qty 38

## 2012-06-14 MED ORDER — PACLITAXEL PROTEIN-BOUND CHEMO INJECTION 100 MG
80.0000 mg/m2 | Freq: Once | INTRAVENOUS | Status: AC
  Administered 2012-06-14: 150 mg via INTRAVENOUS
  Filled 2012-06-14: qty 30

## 2012-06-14 MED ORDER — SODIUM CHLORIDE 0.9 % IJ SOLN
10.0000 mL | INTRAMUSCULAR | Status: DC | PRN
  Administered 2012-06-14: 10 mL
  Filled 2012-06-14: qty 10

## 2012-06-14 MED ORDER — PROCHLORPERAZINE MALEATE 10 MG PO TABS
10.0000 mg | ORAL_TABLET | Freq: Once | ORAL | Status: AC
  Administered 2012-06-14: 10 mg via ORAL

## 2012-06-14 MED ORDER — HEPARIN SOD (PORK) LOCK FLUSH 100 UNIT/ML IV SOLN
500.0000 [IU] | Freq: Once | INTRAVENOUS | Status: AC | PRN
  Administered 2012-06-14: 500 [IU]
  Filled 2012-06-14: qty 5

## 2012-06-14 MED ORDER — SODIUM CHLORIDE 0.9 % IV SOLN
Freq: Once | INTRAVENOUS | Status: AC
  Administered 2012-06-14: 10:00:00 via INTRAVENOUS

## 2012-06-14 NOTE — Patient Instructions (Signed)
[facility] Discharge Instructions for Patients Receiving Chemotherapy  Today you received the following chemotherapy agents: abraxane, gemzar   To help prevent nausea and vomiting after your treatment, we encourage you to take your nausea medication.  Take it as often as prescribed.     If you develop nausea and vomiting that is not controlled by your nausea medication, call the clinic. If it is after clinic hours your family physician or the after hours number for the clinic or go to the Emergency Department.   BELOW ARE SYMPTOMS THAT SHOULD BE REPORTED IMMEDIATELY:  *FEVER GREATER THAN 100.5 F  *CHILLS WITH OR WITHOUT FEVER  NAUSEA AND VOMITING THAT IS NOT CONTROLLED WITH YOUR NAUSEA MEDICATION  *UNUSUAL SHORTNESS OF BREATH  *UNUSUAL BRUISING OR BLEEDING  TENDERNESS IN MOUTH AND THROAT WITH OR WITHOUT PRESENCE OF ULCERS  *URINARY PROBLEMS  *BOWEL PROBLEMS  UNUSUAL RASH Items with * indicate a potential emergency and should be followed up as soon as possible.  Feel free to call the clinic you have any questions or concerns. The clinic phone number is [phone].   I have been informed and understand all the instructions given to me. I know to contact the clinic, my physician, or go to the Emergency Department if any problems should occur. I do not have any questions at this time, but understand that I may call the clinic during office hours   should I have any questions or need assistance in obtaining follow up care.    __________________________________________  _____________  __________ Signature of Patient or Authorized Representative            Date                   Time    __________________________________________ Nurse's Signature    

## 2012-06-21 ENCOUNTER — Other Ambulatory Visit: Payer: Medicare Other | Admitting: Lab

## 2012-06-24 ENCOUNTER — Ambulatory Visit (HOSPITAL_COMMUNITY)
Admission: RE | Admit: 2012-06-24 | Discharge: 2012-06-24 | Disposition: A | Discharge status: Home / Self Care | Payer: Medicare Other | Source: Ambulatory Visit | Attending: Physician Assistant | Admitting: Physician Assistant

## 2012-06-24 ENCOUNTER — Other Ambulatory Visit (HOSPITAL_BASED_OUTPATIENT_CLINIC_OR_DEPARTMENT_OTHER): Payer: Medicare Other | Admitting: Lab

## 2012-06-24 ENCOUNTER — Encounter (HOSPITAL_COMMUNITY): Payer: Self-pay

## 2012-06-24 DIAGNOSIS — C259 Malignant neoplasm of pancreas, unspecified: Secondary | ICD-10-CM

## 2012-06-24 DIAGNOSIS — N200 Calculus of kidney: Secondary | ICD-10-CM | POA: Insufficient documentation

## 2012-06-24 DIAGNOSIS — N400 Enlarged prostate without lower urinary tract symptoms: Secondary | ICD-10-CM | POA: Insufficient documentation

## 2012-06-24 DIAGNOSIS — K769 Liver disease, unspecified: Secondary | ICD-10-CM | POA: Insufficient documentation

## 2012-06-24 LAB — CBC WITH DIFFERENTIAL/PLATELET
Eosinophils Absolute: 0.3 10*3/uL (ref 0.0–0.5)
MONO#: 1.6 10*3/uL — ABNORMAL HIGH (ref 0.1–0.9)
NEUT#: 2.3 10*3/uL (ref 1.5–6.5)
Platelets: 414 10*3/uL — ABNORMAL HIGH (ref 140–400)
RBC: 3.04 10*6/uL — ABNORMAL LOW (ref 4.20–5.82)
RDW: 18.3 % — ABNORMAL HIGH (ref 11.0–14.6)
WBC: 5.3 10*3/uL (ref 4.0–10.3)

## 2012-06-24 LAB — COMPREHENSIVE METABOLIC PANEL (CC13)
AST: 33 U/L (ref 5–34)
Albumin: 3.4 g/dL — ABNORMAL LOW (ref 3.5–5.0)
Alkaline Phosphatase: 247 U/L — ABNORMAL HIGH (ref 40–150)
BUN: 28 mg/dL — ABNORMAL HIGH (ref 7.0–26.0)
Glucose: 127 mg/dl — ABNORMAL HIGH (ref 70–99)
Potassium: 4.4 mEq/L (ref 3.5–5.1)
Sodium: 138 mEq/L (ref 136–145)
Total Bilirubin: 0.54 mg/dL (ref 0.20–1.20)

## 2012-06-24 MED ORDER — IOHEXOL 300 MG/ML  SOLN
80.0000 mL | Freq: Once | INTRAMUSCULAR | Status: AC | PRN
  Administered 2012-06-24: 80 mL via INTRAVENOUS

## 2012-06-28 ENCOUNTER — Ambulatory Visit: Payer: Medicare Other | Admitting: Internal Medicine

## 2012-06-28 ENCOUNTER — Other Ambulatory Visit: Payer: Medicare Other | Admitting: Lab

## 2012-06-28 ENCOUNTER — Telehealth: Payer: Self-pay | Admitting: *Deleted

## 2012-06-28 ENCOUNTER — Ambulatory Visit (HOSPITAL_BASED_OUTPATIENT_CLINIC_OR_DEPARTMENT_OTHER): Payer: Medicare Other

## 2012-06-28 ENCOUNTER — Other Ambulatory Visit (HOSPITAL_BASED_OUTPATIENT_CLINIC_OR_DEPARTMENT_OTHER): Payer: Medicare Other

## 2012-06-28 ENCOUNTER — Ambulatory Visit: Payer: Medicare Other | Admitting: Nutrition

## 2012-06-28 ENCOUNTER — Ambulatory Visit (HOSPITAL_BASED_OUTPATIENT_CLINIC_OR_DEPARTMENT_OTHER): Payer: Medicare Other | Admitting: Internal Medicine

## 2012-06-28 ENCOUNTER — Other Ambulatory Visit: Payer: Self-pay | Admitting: Medical Oncology

## 2012-06-28 VITALS — BP 98/61 | HR 75 | Temp 97.1°F | Resp 20 | Ht 67.0 in | Wt 159.0 lb

## 2012-06-28 DIAGNOSIS — C259 Malignant neoplasm of pancreas, unspecified: Secondary | ICD-10-CM

## 2012-06-28 DIAGNOSIS — C787 Secondary malignant neoplasm of liver and intrahepatic bile duct: Secondary | ICD-10-CM

## 2012-06-28 DIAGNOSIS — R52 Pain, unspecified: Secondary | ICD-10-CM

## 2012-06-28 DIAGNOSIS — C252 Malignant neoplasm of tail of pancreas: Secondary | ICD-10-CM

## 2012-06-28 DIAGNOSIS — Z5111 Encounter for antineoplastic chemotherapy: Secondary | ICD-10-CM

## 2012-06-28 DIAGNOSIS — C779 Secondary and unspecified malignant neoplasm of lymph node, unspecified: Secondary | ICD-10-CM

## 2012-06-28 DIAGNOSIS — C50919 Malignant neoplasm of unspecified site of unspecified female breast: Secondary | ICD-10-CM

## 2012-06-28 LAB — CBC WITH DIFFERENTIAL/PLATELET
Basophils Absolute: 0 10*3/uL (ref 0.0–0.1)
EOS%: 4.6 % (ref 0.0–7.0)
Eosinophils Absolute: 0.2 10*3/uL (ref 0.0–0.5)
HGB: 9 g/dL — ABNORMAL LOW (ref 13.0–17.1)
LYMPH%: 24 % (ref 14.0–49.0)
MCH: 31.4 pg (ref 27.2–33.4)
MCV: 95.8 fL (ref 79.3–98.0)
MONO%: 23 % — ABNORMAL HIGH (ref 0.0–14.0)
NEUT#: 2.4 10*3/uL (ref 1.5–6.5)
NEUT%: 48 % (ref 39.0–75.0)
Platelets: 543 10*3/uL — ABNORMAL HIGH (ref 140–400)

## 2012-06-28 LAB — COMPREHENSIVE METABOLIC PANEL (CC13)
ALT: 26 U/L (ref 0–55)
AST: 24 U/L (ref 5–34)
Albumin: 3.3 g/dL — ABNORMAL LOW (ref 3.5–5.0)
BUN: 20 mg/dL (ref 7.0–26.0)
CO2: 28 mEq/L (ref 22–29)
Calcium: 8.9 mg/dL (ref 8.4–10.4)
Creatinine: 0.8 mg/dL (ref 0.7–1.3)
Glucose: 120 mg/dl — ABNORMAL HIGH (ref 70–99)
Total Bilirubin: 0.54 mg/dL (ref 0.20–1.20)

## 2012-06-28 MED ORDER — SODIUM CHLORIDE 0.9 % IJ SOLN
10.0000 mL | INTRAMUSCULAR | Status: DC | PRN
  Administered 2012-06-28: 10 mL
  Filled 2012-06-28: qty 10

## 2012-06-28 MED ORDER — GEMCITABINE HCL CHEMO INJECTION 1 GM/26.3ML
800.0000 mg/m2 | Freq: Once | INTRAVENOUS | Status: AC
  Administered 2012-06-28: 1444 mg via INTRAVENOUS
  Filled 2012-06-28: qty 38

## 2012-06-28 MED ORDER — HEPARIN SOD (PORK) LOCK FLUSH 100 UNIT/ML IV SOLN
500.0000 [IU] | Freq: Once | INTRAVENOUS | Status: AC | PRN
  Administered 2012-06-28: 500 [IU]
  Filled 2012-06-28: qty 5

## 2012-06-28 MED ORDER — PROCHLORPERAZINE MALEATE 10 MG PO TABS
10.0000 mg | ORAL_TABLET | Freq: Once | ORAL | Status: AC
  Administered 2012-06-28: 10 mg via ORAL

## 2012-06-28 MED ORDER — SODIUM CHLORIDE 0.9 % IV SOLN
Freq: Once | INTRAVENOUS | Status: AC
  Administered 2012-06-28: 12:00:00 via INTRAVENOUS

## 2012-06-28 MED ORDER — OXYCODONE HCL 10 MG PO TB12: 10.0000 mg | ORAL_TABLET | Freq: Two times a day (BID) | ORAL | Status: DC

## 2012-06-28 MED ORDER — PACLITAXEL PROTEIN-BOUND CHEMO INJECTION 100 MG
80.0000 mg/m2 | Freq: Once | INTRAVENOUS | Status: AC
  Administered 2012-06-28: 150 mg via INTRAVENOUS
  Filled 2012-06-28: qty 30

## 2012-06-28 NOTE — Patient Instructions (Addendum)
[facility] Discharge Instructions for Patients Receiving Chemotherapy  Today you received the following chemotherapy agents: Gemzar, Abraxane  To help prevent nausea and vomiting after your treatment, we encourage you to take your nausea medication as directed by your MD.   If you develop nausea and vomiting that is not controlled by your nausea medication, call the clinic. If it is after clinic hours your family physician or the after hours number for the clinic or go to the Emergency Department.   BELOW ARE SYMPTOMS THAT SHOULD BE REPORTED IMMEDIATELY:  *FEVER GREATER THAN 100.5 F  *CHILLS WITH OR WITHOUT FEVER  NAUSEA AND VOMITING THAT IS NOT CONTROLLED WITH YOUR NAUSEA MEDICATION  *UNUSUAL SHORTNESS OF BREATH  *UNUSUAL BRUISING OR BLEEDING  TENDERNESS IN MOUTH AND THROAT WITH OR WITHOUT PRESENCE OF ULCERS  *URINARY PROBLEMS  *BOWEL PROBLEMS  UNUSUAL RASH Items with * indicate a potential emergency and should be followed up as soon as possible.  Feel free to call the clinic you have any questions or concerns. The clinic phone number is [phone].    

## 2012-06-28 NOTE — Telephone Encounter (Signed)
Made patient appointment for 07-12-2012 starting at 9:15am   Sent michelle email to set up patient treatment on 07-12-2012

## 2012-06-28 NOTE — Progress Notes (Signed)
[facility] [phone]   [phone]  OFFICE PROGRESS NOTE  [name], MD Dr. Sharman Crate [place]rick [address] [place] [address]  DIAGNOSIS: Metastatic pancreatic adenocarcinoma, now with metastatic disease to the liver and the abdominal wall, initially diagnosed with stage IIIB(T3, N1, M0) pancreatic tail adenocarcinoma in November of 2011.   PRIOR THERAPY:  #1 distal pancreatectomy and splenectomy on 06/25/2010  #2 adjuvant chemotherapy with gemcitabine started 08/20/2010 and completed 2 cycles on 12/02/2010.  #3 status post concurrent chemoradiation with Xeloda from 12/15/2010 on 10 01/24/2011.  #4 status post 2 more cycles of adjuvant gemcitabine with gemcitabine completed on 04/04/2011.  #5 FOLFOX giving every 2 weeks status post 12 cycles.  #6 Continuous infusion 5-FU and leucovorin every 2 weeks. Status post 4 cycles discontinued today secondary to disease progression.   CURRENT THERAPY: The patient was started on 05/03/2012 the first cycle of systemic chemotherapy with gemcitabine 800 mg/M2 in addition to Abraxane 80 mg/M2 on weekly basis for 3 out of every 4 weeks. Status post 7 cycles.  INTERVAL HISTORY: Philip Shaw 77 y.o. male returns to the clinic today for followup visit accompanied his wife. The patient tolerated the last few cycles of his systemic chemotherapy with gemcitabine and Abraxane fairly well with no significant complaints. He continues to have mild peripheral neuropathy but this was not worse than when compared to prior to his chemotherapy with this regimen. The patient denied having any significant weight loss or night sweats. He denied having any nausea or vomiting. He has no fever or chills. He has repeat CT scan of the chest, abdomen and pelvis performed recently and he is here today for evaluation and discussion of his scan results.  MEDICAL HISTORY: Past Medical History  Diagnosis Date  . Heart  murmur   . Abdominal pain, periumbilical   . Constipation   . Diabetes mellitus   . Hypertension   . Sleep apnea     pt states he no longer uses his cpap - lost weight  . pancreatic ca dx'd 04/2010    METASTATIC PANCREATIC CA WITH METS TO LIVER AND ABDOMINAL WALL  -chemo every 2 weeks - ONCOLOGIST DR. Cloe Sockwell WITH [facility].  Marland Kitchen Port-a-cath in place     RIGHT UPPER CHEST - FOR CHEMO EVERY 2 WEEKS    ALLERGIES:   has no known allergies.  MEDICATIONS:  Current Outpatient Prescriptions  Medication Sig Dispense Refill  . Alum & Mag Hydroxide-Simeth (MAGIC MOUTHWASH) SOLN Take 5 mLs by mouth QID.  240 mL  0  . aspirin 81 MG tablet Take 81 mg by mouth every morning.       . gabapentin (NEURONTIN) 100 MG capsule Take 1 capsule (100 mg total) by mouth 3 (three) times daily.  90 capsule  1  . insulin glargine (LANTUS) 100 UNIT/ML injection Inject into the skin at bedtime. 32 units every night except week of chemo pt would increase to 37      . lidocaine-prilocaine (EMLA) cream Apply 1 application topically as needed.  30 g  2  . magnesium hydroxide (MILK OF MAGNESIA) 400 MG/5ML suspension Take by mouth daily.       . methocarbamol (ROBAXIN) 500 MG tablet       . mirtazapine (REMERON) 30 MG tablet Take 1 tablet (30 mg total) by mouth at bedtime.  90 tablet  0  . ondansetron (ZOFRAN) 8 MG tablet Take 1 tablet (8  mg total) by mouth every 12 (twelve) hours as needed for nausea.  60 tablet  2  . oxyCODONE (OXY IR/ROXICODONE) 5 MG immediate release tablet Take 1 tablet (5 mg total) by mouth every 4 (four) hours as needed.  30 tablet  0  . oxyCODONE-acetaminophen (PERCOCET) 10-325 MG per tablet       . pravastatin (PRAVACHOL) 20 MG tablet Take 20 mg by mouth at bedtime.       . ranitidine (ZANTAC) 150 MG tablet Take 150 mg by mouth 2 (two) times daily.       . Tamsulosin HCl (FLOMAX) 0.4 MG CAPS Once or twice daily      . terazosin (HYTRIN) 2 MG capsule Take 2 mg by mouth 2  (two) times daily.       Marland Kitchen oxyCODONE (OXYCONTIN) 10 MG 12 hr tablet Take 1 tablet (10 mg total) by mouth every 12 (twelve) hours.  60 tablet  0  . [DISCONTINUED] metFORMIN (GLUCOPHAGE) 850 MG tablet Take 850 mg by mouth 2 (two) times daily with a meal.         SURGICAL HISTORY:  Past Surgical History  Procedure Date  . Appendectomy 74-25 years old  . Pancreatectomy 06/2010    with splenectomy  . Inguinal hernia repair as infant, age 46    LIH- infant, RIH age 80  . Knee arthroscopy 04/23/2012    Procedure: ARTHROSCOPY KNEE;  Surgeon: Loanne Drilling, MD;  Location: [facility];  Service: Orthopedics;  Laterality: Right;  with Debridement    REVIEW OF SYSTEMS:  A comprehensive review of systems was negative except for: Constitutional: positive for fatigue Neurological: positive for paresthesia   PHYSICAL EXAMINATION: General appearance: alert, cooperative and no distress Head: Normocephalic, without obvious abnormality, atraumatic Neck: no adenopathy Lymph nodes: Cervical, supraclavicular, and axillary nodes normal. Resp: clear to auscultation bilaterally Cardio: regular rate and rhythm, S1, S2 normal, no murmur, click, rub or gallop GI: soft, non-tender; bowel sounds normal; no masses,  no organomegaly Extremities: extremities normal, atraumatic, no cyanosis or edema Neurologic: Alert and oriented X 3, normal strength and tone. Normal symmetric reflexes. Normal coordination and gait  ECOG PERFORMANCE STATUS: 1 - Symptomatic but completely ambulatory  Blood pressure 98/61, pulse 75, temperature 97.1 F (36.2 C), temperature source Oral, resp. rate 20, height 5\' 7"  (1.702 m), weight 159 lb (72.122 kg).  LABORATORY DATA: Lab Results  Component Value Date   WBC 5.1 06/28/2012   HGB 9.0* 06/28/2012   HCT 27.5* 06/28/2012   MCV 95.8 06/28/2012   PLT 543* 06/28/2012      Chemistry      Component Value Date/Time   NA 138 06/24/2012 1441   NA 136 04/19/2012 0930   K 4.4 06/24/2012 1441   K  4.3 04/19/2012 0930   CL 104 06/24/2012 1441   CL 100 04/19/2012 0930   CO2 28 06/24/2012 1441   CO2 28 04/19/2012 0930   BUN 28.0* 06/24/2012 1441   BUN 18 04/19/2012 0930   CREATININE 0.8 06/24/2012 1441   CREATININE 0.76 04/19/2012 0930      Component Value Date/Time   CALCIUM 9.5 06/24/2012 1441   CALCIUM 9.2 04/19/2012 0930   ALKPHOS 247* 06/24/2012 1441   ALKPHOS 249* 04/19/2012 0930   AST 33 06/24/2012 1441   AST 45* 04/19/2012 0930   ALT 37 06/24/2012 1441   ALT 42 04/19/2012 0930   BILITOT 0.54 06/24/2012 1441   BILITOT 0.6 04/19/2012 0930  RADIOGRAPHIC STUDIES: Ct Chest W Contrast  06/24/2012  *RADIOLOGY REPORT*  Clinical Data:  Follow-up pancreatic carcinoma.  Currently undergoing chemotherapy.  Restaging.  CT CHEST, ABDOMEN AND PELVIS WITH CONTRAST  Technique:  Multidetector CT imaging of the chest, abdomen and pelvis was performed following the standard protocol during bolus administration of intravenous contrast.  Contrast: 80mL OMNIPAQUE IOHEXOL 300 MG/ML  SOLN  Comparison:  04/12/2012   CT CHEST  Findings:  No evidence of mediastinal or hilar masses.  No adenopathy seen elsewhere within the thorax.  No evidence of pleural or pericardial effusion.  No suspicious pulmonary nodules or masses are identified.  No evidence of pulmonary infiltrate.  No central endobronchial lesion identified.  Healing fracture of the inferior right scapular body again noted.   No other suspicious bone lesions identified.  IMPRESSION:  1.  Healing fracture of the inferior right scapula again noted. Pathologic fracture cannot definitely be excluded. 2.  No other sites of metastatic disease within the thorax.   CT ABDOMEN AND PELVIS  Findings:  2.1 by 1.5 cm low attenuation lesion in the left hepatic lobe remains stable.  No new or enlarging liver masses are identified.  Pneumobilia again demonstrated.  Calcifications again noted in the pancreatic head and neck, and postop changes from distal  pancreatectomy and splenectomy are stable.  No pancreatic mass or peripancreatic lymphadenopathy identified.  Small bilateral renal calculi and cysts are stable, and there is no evidence of renal mass or hydronephrosis.  Both adrenal glands are normal appearance.  Soft tissue stranding and nodularity in the omental fat in the region of the transverse colon and splenic flexure remains stable. No new or enlarging masses are identified.  Mildly enlarged prostate gland is stable.  No evidence of ascites.  No evidence of inflammatory process or abscess.  Diverticulosis of the sigmoid colon again demonstrated, however there is no evidence of diverticulitis.  No evidence of bowel obstruction or hernia.  No suspicious bone lesions are identified.  IMPRESSION:  1.  Stable 2.1 cm left hepatic lobe mass, consistent with liver metastasis. 2.  Stable intraperitoneal disease involving the omental fat in the central and left abdomen.  No evidence of ascites. 3.  No new or progressive disease identified within the abdomen or pelvis. 4.  Stable enlarged prostate and bilateral nonobstructing nephrolithiasis.   Original Report Authenticated By: Myles Rosenthal, M.D.    ASSESSMENT: This is a very pleasant 77 years old white male with metastatic pancreatic adenocarcinoma currently on treatment with gemcitabine and Abraxane status post 6 weekly doses. The patient is doing fine and he has stable disease on the recent CT scan of the chest, abdomen and pelvis.   PLAN: I discussed the scan results with the patient and his wife. I recommended for him to continue on the same treatment regimen. He was started cycle #7 today. He would come back for followup visit in 2 weeks for evaluation and management any adverse effect of his chemotherapy. He was advised to call immediately if he has any concerning symptoms in the interval.  All questions were answered. The patient knows to call the clinic with any problems, questions or concerns. We can  certainly see the patient much sooner if necessary.  I spent 15 minutes counseling the patient face to face. The total time spent in the appointment was 25 minutes.

## 2012-06-28 NOTE — Telephone Encounter (Signed)
requests refill -sent to dr Arbutus Ped for approval

## 2012-06-28 NOTE — Patient Instructions (Addendum)
Your CT scan of the chest, abdomen and pelvis showed no evidence for disease progression. Will continue his chemotherapy with the current regimen. Followup in 2 weeks

## 2012-06-28 NOTE — Progress Notes (Signed)
Brief follow up with patient and wife.  Mr. Tuomi reports eating well.  His weight has increased to 159 pounds on November 4 from 156.6 pounds on October 14.  Patient able to eat steak and baked potato several weeks ago and plans on eating the same tonight.  He still continues milkshakes as needed.  He verbalizes no nutrition concerns.  Nutrition diagnosis:  Unintended weight loss improved.  Intervention:  I have encouraged patient to continue oral intake to maintain weight choosing foods he tolerates.    Monitoring, evaluation, goals:  Patient has tolerated oral intake to maintain weight and has gained 2.4 pounds over the past 2 weeks.  Next visit:  Monday, November 11, during chemotherapy.

## 2012-07-05 ENCOUNTER — Other Ambulatory Visit (HOSPITAL_BASED_OUTPATIENT_CLINIC_OR_DEPARTMENT_OTHER): Payer: Medicare Other

## 2012-07-05 ENCOUNTER — Ambulatory Visit (HOSPITAL_BASED_OUTPATIENT_CLINIC_OR_DEPARTMENT_OTHER): Payer: Medicare Other

## 2012-07-05 ENCOUNTER — Ambulatory Visit: Payer: Medicare Other | Admitting: Nutrition

## 2012-07-05 ENCOUNTER — Other Ambulatory Visit: Payer: Self-pay | Admitting: Emergency Medicine

## 2012-07-05 VITALS — BP 118/64 | HR 97 | Temp 98.1°F

## 2012-07-05 DIAGNOSIS — Z5111 Encounter for antineoplastic chemotherapy: Secondary | ICD-10-CM

## 2012-07-05 DIAGNOSIS — C252 Malignant neoplasm of tail of pancreas: Secondary | ICD-10-CM

## 2012-07-05 DIAGNOSIS — C787 Secondary malignant neoplasm of liver and intrahepatic bile duct: Secondary | ICD-10-CM

## 2012-07-05 DIAGNOSIS — C259 Malignant neoplasm of pancreas, unspecified: Secondary | ICD-10-CM

## 2012-07-05 DIAGNOSIS — C172 Malignant neoplasm of ileum: Secondary | ICD-10-CM

## 2012-07-05 DIAGNOSIS — G629 Polyneuropathy, unspecified: Secondary | ICD-10-CM

## 2012-07-05 LAB — COMPREHENSIVE METABOLIC PANEL (CC13)
ALT: 40 U/L (ref 0–55)
AST: 38 U/L — ABNORMAL HIGH (ref 5–34)
Alkaline Phosphatase: 211 U/L — ABNORMAL HIGH (ref 40–150)
BUN: 22 mg/dL (ref 7.0–26.0)
Creatinine: 0.8 mg/dL (ref 0.7–1.3)
Potassium: 4.6 mEq/L (ref 3.5–5.1)

## 2012-07-05 LAB — CBC WITH DIFFERENTIAL/PLATELET
BASO%: 0.8 % (ref 0.0–2.0)
EOS%: 5 % (ref 0.0–7.0)
HCT: 26.8 % — ABNORMAL LOW (ref 38.4–49.9)
LYMPH%: 20.6 % (ref 14.0–49.0)
MCH: 30.9 pg (ref 27.2–33.4)
MCHC: 32.5 g/dL (ref 32.0–36.0)
NEUT%: 48.5 % (ref 39.0–75.0)
RBC: 2.82 10*6/uL — ABNORMAL LOW (ref 4.20–5.82)
WBC: 3.8 10*3/uL — ABNORMAL LOW (ref 4.0–10.3)
lymph#: 0.8 10*3/uL — ABNORMAL LOW (ref 0.9–3.3)
nRBC: 1 % — ABNORMAL HIGH (ref 0–0)

## 2012-07-05 MED ORDER — SODIUM CHLORIDE 0.9 % IV SOLN
Freq: Once | INTRAVENOUS | Status: AC
  Administered 2012-07-05: 10:00:00 via INTRAVENOUS

## 2012-07-05 MED ORDER — GABAPENTIN 100 MG PO CAPS: 100.0000 mg | ORAL_CAPSULE | Freq: Three times a day (TID) | ORAL | Status: DC

## 2012-07-05 MED ORDER — PROCHLORPERAZINE MALEATE 10 MG PO TABS
10.0000 mg | ORAL_TABLET | Freq: Once | ORAL | Status: AC
  Administered 2012-07-05: 10 mg via ORAL

## 2012-07-05 MED ORDER — HEPARIN SOD (PORK) LOCK FLUSH 100 UNIT/ML IV SOLN
500.0000 [IU] | Freq: Once | INTRAVENOUS | Status: AC | PRN
  Administered 2012-07-05: 500 [IU]
  Filled 2012-07-05: qty 5

## 2012-07-05 MED ORDER — PACLITAXEL PROTEIN-BOUND CHEMO INJECTION 100 MG
80.0000 mg/m2 | Freq: Once | INTRAVENOUS | Status: AC
  Administered 2012-07-05: 150 mg via INTRAVENOUS
  Filled 2012-07-05: qty 30

## 2012-07-05 MED ORDER — SODIUM CHLORIDE 0.9 % IV SOLN
800.0000 mg/m2 | Freq: Once | INTRAVENOUS | Status: AC
  Administered 2012-07-05: 1444 mg via INTRAVENOUS
  Filled 2012-07-05: qty 38

## 2012-07-05 MED ORDER — SODIUM CHLORIDE 0.9 % IJ SOLN
10.0000 mL | INTRAMUSCULAR | Status: DC | PRN
  Administered 2012-07-05: 10 mL
  Filled 2012-07-05: qty 10

## 2012-07-05 NOTE — Patient Instructions (Signed)
[facility] Discharge Instructions for Patients Receiving Chemotherapy  Today you received the following chemotherapy agents Gemzar and Abraxane  To help prevent nausea and vomiting after your treatment, we encourage you to take your nausea medication as prescribed.   If you develop nausea and vomiting that is not controlled by your nausea medication, call the clinic. If it is after clinic hours your family physician or the after hours number for the clinic or go to the Emergency Department.   BELOW ARE SYMPTOMS THAT SHOULD BE REPORTED IMMEDIATELY:  *FEVER GREATER THAN 100.5 F  *CHILLS WITH OR WITHOUT FEVER  NAUSEA AND VOMITING THAT IS NOT CONTROLLED WITH YOUR NAUSEA MEDICATION  *UNUSUAL SHORTNESS OF BREATH  *UNUSUAL BRUISING OR BLEEDING  TENDERNESS IN MOUTH AND THROAT WITH OR WITHOUT PRESENCE OF ULCERS  *URINARY PROBLEMS  *BOWEL PROBLEMS  UNUSUAL RASH Items with * indicate a potential emergency and should be followed up as soon as possible.  Feel free to call the clinic you have any questions or concerns. The clinic phone number is [phone].   I have been informed and understand all the instructions given to me. I know to contact the clinic, my physician, or go to the Emergency Department if any problems should occur. I do not have any questions at this time, but understand that I may call the clinic during office hours   should I have any questions or need assistance in obtaining follow up care.    

## 2012-07-05 NOTE — Telephone Encounter (Signed)
Pt in infusion for chemo this am, requests refill of Gabapentin to be sent to [facility] in [place]

## 2012-07-05 NOTE — Progress Notes (Signed)
Patient reports doing pretty well. He continues to eat well without complaint. His weight was documented as 159 pounds November 4 which is increased from 156.6 pounds October 14. Patient reports he has a goal of increasing weight by 10 pounds. He verbalizes no nutrition issues today.  Nutrition diagnosis unintended weight loss has resolved.  I have provided encouragement and education for patient to continue high-calorie, high-protein foods and milk shakes throughout the day. The patient will continue oral intake as tolerated to promote weight maintenance. There is no followup scheduled, however, patient does have my contact information and states he will contact me if he has questions or concerns.

## 2012-07-06 ENCOUNTER — Telehealth: Payer: Self-pay | Admitting: *Deleted

## 2012-07-06 NOTE — Telephone Encounter (Signed)
Per staff message and POF I have scheduled appts.  [name]  

## 2012-07-12 ENCOUNTER — Ambulatory Visit (HOSPITAL_BASED_OUTPATIENT_CLINIC_OR_DEPARTMENT_OTHER): Payer: Medicare Other | Admitting: Physician Assistant

## 2012-07-12 ENCOUNTER — Other Ambulatory Visit (HOSPITAL_BASED_OUTPATIENT_CLINIC_OR_DEPARTMENT_OTHER): Payer: Medicare Other | Admitting: Lab

## 2012-07-12 ENCOUNTER — Ambulatory Visit (HOSPITAL_BASED_OUTPATIENT_CLINIC_OR_DEPARTMENT_OTHER): Payer: Medicare Other

## 2012-07-12 ENCOUNTER — Encounter: Payer: Self-pay | Admitting: Physician Assistant

## 2012-07-12 ENCOUNTER — Telehealth: Payer: Self-pay | Admitting: Internal Medicine

## 2012-07-12 VITALS — BP 134/70 | HR 88 | Temp 97.8°F | Resp 18 | Ht 67.0 in | Wt 160.6 lb

## 2012-07-12 DIAGNOSIS — C252 Malignant neoplasm of tail of pancreas: Secondary | ICD-10-CM

## 2012-07-12 DIAGNOSIS — C787 Secondary malignant neoplasm of liver and intrahepatic bile duct: Secondary | ICD-10-CM

## 2012-07-12 DIAGNOSIS — Z5111 Encounter for antineoplastic chemotherapy: Secondary | ICD-10-CM

## 2012-07-12 DIAGNOSIS — C50919 Malignant neoplasm of unspecified site of unspecified female breast: Secondary | ICD-10-CM

## 2012-07-12 DIAGNOSIS — C259 Malignant neoplasm of pancreas, unspecified: Secondary | ICD-10-CM

## 2012-07-12 LAB — CBC WITH DIFFERENTIAL/PLATELET
Basophils Absolute: 0 10*3/uL (ref 0.0–0.1)
Eosinophils Absolute: 0.2 10*3/uL (ref 0.0–0.5)
HGB: 8.1 g/dL — ABNORMAL LOW (ref 13.0–17.1)
MCV: 93.2 fL (ref 79.3–98.0)
MONO#: 0.9 10*3/uL (ref 0.1–0.9)
NEUT#: 2 10*3/uL (ref 1.5–6.5)
Platelets: 151 10*3/uL (ref 140–400)
RBC: 2.63 10*6/uL — ABNORMAL LOW (ref 4.20–5.82)
RDW: 18.6 % — ABNORMAL HIGH (ref 11.0–14.6)
WBC: 3.8 10*3/uL — ABNORMAL LOW (ref 4.0–10.3)
nRBC: 2 % — ABNORMAL HIGH (ref 0–0)

## 2012-07-12 LAB — COMPREHENSIVE METABOLIC PANEL (CC13)
Albumin: 3.3 g/dL — ABNORMAL LOW (ref 3.5–5.0)
CO2: 28 mEq/L (ref 22–29)
Calcium: 9 mg/dL (ref 8.4–10.4)
Chloride: 105 mEq/L (ref 98–107)
Glucose: 81 mg/dl (ref 70–99)
Sodium: 136 mEq/L (ref 136–145)
Total Bilirubin: 0.43 mg/dL (ref 0.20–1.20)
Total Protein: 6.5 g/dL (ref 6.4–8.3)

## 2012-07-12 MED ORDER — SODIUM CHLORIDE 0.9 % IJ SOLN
10.0000 mL | INTRAMUSCULAR | Status: DC | PRN
  Administered 2012-07-12: 10 mL
  Filled 2012-07-12: qty 10

## 2012-07-12 MED ORDER — PROCHLORPERAZINE MALEATE 10 MG PO TABS
10.0000 mg | ORAL_TABLET | Freq: Once | ORAL | Status: AC
  Administered 2012-07-12: 10 mg via ORAL

## 2012-07-12 MED ORDER — PACLITAXEL PROTEIN-BOUND CHEMO INJECTION 100 MG
80.0000 mg/m2 | Freq: Once | INTRAVENOUS | Status: AC
  Administered 2012-07-12: 150 mg via INTRAVENOUS
  Filled 2012-07-12: qty 30

## 2012-07-12 MED ORDER — SODIUM CHLORIDE 0.9 % IV SOLN
800.0000 mg/m2 | Freq: Once | INTRAVENOUS | Status: AC
  Administered 2012-07-12: 1444 mg via INTRAVENOUS
  Filled 2012-07-12: qty 38

## 2012-07-12 MED ORDER — HEPARIN SOD (PORK) LOCK FLUSH 100 UNIT/ML IV SOLN
500.0000 [IU] | Freq: Once | INTRAVENOUS | Status: AC | PRN
  Administered 2012-07-12: 500 [IU]
  Filled 2012-07-12: qty 5

## 2012-07-12 MED ORDER — SODIUM CHLORIDE 0.9 % IV SOLN
Freq: Once | INTRAVENOUS | Status: AC
  Administered 2012-07-12: 11:00:00 via INTRAVENOUS

## 2012-07-12 NOTE — Patient Instructions (Signed)
[facility] Discharge Instructions for Patients Receiving Chemotherapy  Today you received the following chemotherapy agents Abraxane/Gemzar  To help prevent nausea and vomiting after your treatment, we encourage you to take your nausea medication   If you develop nausea and vomiting that is not controlled by your nausea medication, call the clinic. If it is after clinic hours your family physician or the after hours number for the clinic or go to the Emergency Department.   BELOW ARE SYMPTOMS THAT SHOULD BE REPORTED IMMEDIATELY:  *FEVER GREATER THAN 100.5 F  *CHILLS WITH OR WITHOUT FEVER  NAUSEA AND VOMITING THAT IS NOT CONTROLLED WITH YOUR NAUSEA MEDICATION  *UNUSUAL SHORTNESS OF BREATH  *UNUSUAL BRUISING OR BLEEDING  TENDERNESS IN MOUTH AND THROAT WITH OR WITHOUT PRESENCE OF ULCERS  *URINARY PROBLEMS  *BOWEL PROBLEMS  UNUSUAL RASH Items with * indicate a potential emergency and should be followed up as soon as possible.  One of the nurses will contact you 24 hours after your treatment. Please let the nurse know about any problems that you may have experienced. Feel free to call the clinic you have any questions or concerns. The clinic phone number is [phone].   I have been informed and understand all the instructions given to me. I know to contact the clinic, my physician, or go to the Emergency Department if any problems should occur. I do not have any questions at this time, but understand that I may call the clinic during office hours   should I have any questions or need assistance in obtaining follow up care.    __________________________________________  _____________  __________ Signature of Patient or Authorized Representative            Date                   Time    __________________________________________ Nurse's Signature

## 2012-07-12 NOTE — Progress Notes (Signed)
[facility] [phone]   [phone]  OFFICE PROGRESS NOTE  [name], MD Dr. Sharman Crate Hamrick [address] [place] [address]  DIAGNOSIS: Metastatic pancreatic adenocarcinoma, now with metastatic disease to the liver and the abdominal wall, initially diagnosed with stage IIIB(T3, N1, M0) pancreatic tail adenocarcinoma in November of 2011.   PRIOR THERAPY:  #1 distal pancreatectomy and splenectomy on 06/25/2010  #2 adjuvant chemotherapy with gemcitabine started 08/20/2010 and completed 2 cycles on 12/02/2010.  #3 status post concurrent chemoradiation with Xeloda from 12/15/2010 on 10 01/24/2011.  #4 status post 2 more cycles of adjuvant gemcitabine with gemcitabine completed on 04/04/2011.  #5 FOLFOX giving every 2 weeks status post 12 cycles.  #6 Continuous infusion 5-FU and leucovorin every 2 weeks. Status post 4 cycles discontinued today secondary to disease progression.   CURRENT THERAPY: The patient was started on 05/03/2012 the first cycle of systemic chemotherapy with gemcitabine 800 mg/M2 in addition to Abraxane 80 mg/M2 on weekly basis for 3 out of every 4 weeks. Status post 8 cycles.  INTERVAL HISTORY: Philip Shaw 77 y.o. male returns to the clinic today for followup visit accompanied his wife. The patient tolerated the last few cycles of his systemic chemotherapy with gemcitabine and Abraxane fairly well with no significant complaints. He voices no specific complaints today. His peripheral neuropathy is at baseline.The patient denied having any significant weight loss or night sweats. He denied having any nausea or vomiting. He has no fever or chills.  MEDICAL HISTORY: Past Medical History  Diagnosis Date  . Heart murmur   . Abdominal pain, periumbilical   . Constipation   . Diabetes mellitus   . Hypertension   . Sleep apnea     pt states he no longer uses his cpap - lost weight  . pancreatic ca dx'd 04/2010   METASTATIC PANCREATIC CA WITH METS TO LIVER AND ABDOMINAL WALL  -chemo every 2 weeks - ONCOLOGIST DR. MOHAMED MOHAMED WITH [facility].  Marland Kitchen Port-a-cath in place     RIGHT UPPER CHEST - FOR CHEMO EVERY 2 WEEKS    ALLERGIES:   has no known allergies.  MEDICATIONS:  Current Outpatient Prescriptions  Medication Sig Dispense Refill  . Alum & Mag Hydroxide-Simeth (MAGIC MOUTHWASH) SOLN Take 5 mLs by mouth QID.  240 mL  0  . aspirin 81 MG tablet Take 81 mg by mouth every morning.       . gabapentin (NEURONTIN) 100 MG capsule Take 1 capsule (100 mg total) by mouth 3 (three) times daily.  90 capsule  1  . insulin glargine (LANTUS) 100 UNIT/ML injection Inject into the skin at bedtime. 32 units every night except week of chemo pt would increase to 37      . lidocaine-prilocaine (EMLA) cream Apply 1 application topically as needed.  30 g  2  . magnesium hydroxide (MILK OF MAGNESIA) 400 MG/5ML suspension Take by mouth daily.       . methocarbamol (ROBAXIN) 500 MG tablet       . mirtazapine (REMERON) 30 MG tablet Take 1 tablet (30 mg total) by mouth at bedtime.  90 tablet  0  . ondansetron (ZOFRAN) 8 MG tablet Take 1 tablet (8 mg total) by mouth every 12 (twelve) hours as needed for nausea.  60 tablet  2  . oxyCODONE (OXY IR/ROXICODONE) 5 MG immediate release tablet Take 1 tablet (5 mg total) by mouth every 4 (four) hours as  needed.  30 tablet  0  . oxyCODONE (OXYCONTIN) 10 MG 12 hr tablet Take 1 tablet (10 mg total) by mouth every 12 (twelve) hours.  60 tablet  0  . oxyCODONE-acetaminophen (PERCOCET) 10-325 MG per tablet       . pravastatin (PRAVACHOL) 20 MG tablet Take 20 mg by mouth at bedtime.       . ranitidine (ZANTAC) 150 MG tablet Take 150 mg by mouth 2 (two) times daily.       . Tamsulosin HCl (FLOMAX) 0.4 MG CAPS Once or twice daily      . terazosin (HYTRIN) 2 MG capsule Take 2 mg by mouth 2 (two) times daily.       . [DISCONTINUED] metFORMIN (GLUCOPHAGE) 850 MG tablet Take 850 mg  by mouth 2 (two) times daily with a meal.        No current facility-administered medications for this visit.   Facility-Administered Medications Ordered in Other Visits  Medication Dose Route Frequency Provider Last Rate Last Dose  . [COMPLETED] 0.9 %  sodium chloride infusion   Intravenous Once Si Gaul, MD 20 mL/hr at 07/12/12 1125    . Gemcitabine HCl (GEMZAR) 1,444 mg in sodium chloride 0.9 % 100 mL chemo infusion  800 mg/m2 (Treatment Plan Actual) Intravenous Once Si Gaul, MD      . heparin lock flush 100 unit/mL  500 Units Intracatheter Once PRN Si Gaul, MD      . PACLitaxel-protein bound (ABRAXANE) chemo infusion 150 mg  80 mg/m2 (Treatment Plan Actual) Intravenous Once Si Gaul, MD      . prochlorperazine (COMPAZINE) tablet 10 mg  10 mg Oral Once Si Gaul, MD      . sodium chloride 0.9 % injection 10 mL  10 mL Intracatheter PRN Si Gaul, MD        SURGICAL HISTORY:  Past Surgical History  Procedure Date  . Appendectomy 5-52 years old  . Pancreatectomy 06/2010    with splenectomy  . Inguinal hernia repair as infant, age 53    LIH- infant, RIH age 78  . Knee arthroscopy 04/23/2012    Procedure: ARTHROSCOPY KNEE;  Surgeon: Loanne Drilling, MD;  Location: [facility];  Service: Orthopedics;  Laterality: Right;  with Debridement    REVIEW OF SYSTEMS:  A comprehensive review of systems was negative except for: Constitutional: positive for fatigue Neurological: positive for paresthesia   PHYSICAL EXAMINATION: General appearance: alert, cooperative and no distress Head: Normocephalic, without obvious abnormality, atraumatic Neck: no adenopathy Lymph nodes: Cervical, supraclavicular, and axillary nodes normal. Resp: clear to auscultation bilaterally Cardio: regular rate and rhythm, S1, S2 normal, no murmur, click, rub or gallop GI: soft, non-tender; bowel sounds normal; no masses,  no organomegaly Extremities: extremities normal, atraumatic, no  cyanosis or edema Neurologic: Alert and oriented X 3, normal strength and tone. Normal symmetric reflexes. Normal coordination and gait  ECOG PERFORMANCE STATUS: 1 - Symptomatic but completely ambulatory  Blood pressure 134/70, pulse 88, temperature 97.8 F (36.6 C), temperature source Oral, resp. rate 18, height 5\' 7"  (1.702 m), weight 160 lb 9.6 oz (72.848 kg).  LABORATORY DATA: Lab Results  Component Value Date   WBC 3.8* 07/12/2012   HGB 8.1* 07/12/2012   HCT 24.5* 07/12/2012   MCV 93.2 07/12/2012   PLT 151 07/12/2012      Chemistry      Component Value Date/Time   NA 137 07/05/2012 0931   NA 136 04/19/2012 0930   K 4.6 07/05/2012 0931  K 4.3 04/19/2012 0930   CL 104 07/05/2012 0931   CL 100 04/19/2012 0930   CO2 28 07/05/2012 0931   CO2 28 04/19/2012 0930   BUN 22.0 07/05/2012 0931   BUN 18 04/19/2012 0930   CREATININE 0.8 07/05/2012 0931   CREATININE 0.76 04/19/2012 0930      Component Value Date/Time   CALCIUM 9.4 07/05/2012 0931   CALCIUM 9.2 04/19/2012 0930   ALKPHOS 211* 07/05/2012 0931   ALKPHOS 249* 04/19/2012 0930   AST 38* 07/05/2012 0931   AST 45* 04/19/2012 0930   ALT 40 07/05/2012 0931   ALT 42 04/19/2012 0930   BILITOT 0.44 07/05/2012 0931   BILITOT 0.6 04/19/2012 0930       RADIOGRAPHIC STUDIES: Ct Chest W Contrast  06/24/2012  *RADIOLOGY REPORT*  Clinical Data:  Follow-up pancreatic carcinoma.  Currently undergoing chemotherapy.  Restaging.  CT CHEST, ABDOMEN AND PELVIS WITH CONTRAST  Technique:  Multidetector CT imaging of the chest, abdomen and pelvis was performed following the standard protocol during bolus administration of intravenous contrast.  Contrast: 80mL OMNIPAQUE IOHEXOL 300 MG/ML  SOLN  Comparison:  04/12/2012   CT CHEST  Findings:  No evidence of mediastinal or hilar masses.  No adenopathy seen elsewhere within the thorax.  No evidence of pleural or pericardial effusion.  No suspicious pulmonary nodules or masses are identified.  No  evidence of pulmonary infiltrate.  No central endobronchial lesion identified.  Healing fracture of the inferior right scapular body again noted.   No other suspicious bone lesions identified.  IMPRESSION:  1.  Healing fracture of the inferior right scapula again noted. Pathologic fracture cannot definitely be excluded. 2.  No other sites of metastatic disease within the thorax.   CT ABDOMEN AND PELVIS  Findings:  2.1 by 1.5 cm low attenuation lesion in the left hepatic lobe remains stable.  No new or enlarging liver masses are identified.  Pneumobilia again demonstrated.  Calcifications again noted in the pancreatic head and neck, and postop changes from distal pancreatectomy and splenectomy are stable.  No pancreatic mass or peripancreatic lymphadenopathy identified.  Small bilateral renal calculi and cysts are stable, and there is no evidence of renal mass or hydronephrosis.  Both adrenal glands are normal appearance.  Soft tissue stranding and nodularity in the omental fat in the region of the transverse colon and splenic flexure remains stable. No new or enlarging masses are identified.  Mildly enlarged prostate gland is stable.  No evidence of ascites.  No evidence of inflammatory process or abscess.  Diverticulosis of the sigmoid colon again demonstrated, however there is no evidence of diverticulitis.  No evidence of bowel obstruction or hernia.  No suspicious bone lesions are identified.  IMPRESSION:  1.  Stable 2.1 cm left hepatic lobe mass, consistent with liver metastasis. 2.  Stable intraperitoneal disease involving the omental fat in the central and left abdomen.  No evidence of ascites. 3.  No new or progressive disease identified within the abdomen or pelvis. 4.  Stable enlarged prostate and bilateral nonobstructing nephrolithiasis.   Original Report Authenticated By: Myles Rosenthal, M.D.    ASSESSMENT/PLAN: This is a very pleasant 77 years old white male with metastatic pancreatic adenocarcinoma  currently on treatment with gemcitabine and Abraxane status post 6 weekly doses. The patient is doing fine and he has stable disease on the recent CT scan of the chest, abdomen and pelvis. Patient was discussed with Dr. Arbutus Ped. He will continue with his current chemotherapy in the  form of gemcitabine and Abraxane. He'll followup in 2 weeks beginning of the next cycle.   Laural Benes, Yanai Hobson E, PA-C   All questions were answered. The patient knows to call the clinic with any problems, questions or concerns. We can certainly see the patient much sooner if necessary.  I spent 20  minutes counseling the patient face to face. The total time spent in the appointment was 30 minutes.

## 2012-07-12 NOTE — Telephone Encounter (Signed)
appts made and printed for pt, pt states he should not have tx on 11/25 and dr mkm is to advise and i will advise the pt  aom

## 2012-07-12 NOTE — Patient Instructions (Addendum)
Follow-up in 2 weeks

## 2012-07-19 ENCOUNTER — Other Ambulatory Visit: Payer: Self-pay | Admitting: Physician Assistant

## 2012-07-19 ENCOUNTER — Other Ambulatory Visit (HOSPITAL_BASED_OUTPATIENT_CLINIC_OR_DEPARTMENT_OTHER): Payer: Medicare Other

## 2012-07-19 ENCOUNTER — Ambulatory Visit: Payer: Medicare Other

## 2012-07-19 DIAGNOSIS — C259 Malignant neoplasm of pancreas, unspecified: Secondary | ICD-10-CM

## 2012-07-19 LAB — CBC WITH DIFFERENTIAL/PLATELET
BASO%: 0.7 % (ref 0.0–2.0)
Eosinophils Absolute: 0.2 10*3/uL (ref 0.0–0.5)
MCHC: 32.7 g/dL (ref 32.0–36.0)
MONO#: 1 10*3/uL — ABNORMAL HIGH (ref 0.1–0.9)
NEUT#: 2.3 10*3/uL (ref 1.5–6.5)
Platelets: 221 10*3/uL (ref 140–400)
RBC: 2.74 10*6/uL — ABNORMAL LOW (ref 4.20–5.82)
RDW: 19.7 % — ABNORMAL HIGH (ref 11.0–14.6)
WBC: 4.5 10*3/uL (ref 4.0–10.3)
lymph#: 1 10*3/uL (ref 0.9–3.3)
nRBC: 4 % — ABNORMAL HIGH (ref 0–0)

## 2012-07-26 ENCOUNTER — Telehealth: Payer: Self-pay | Admitting: *Deleted

## 2012-07-26 ENCOUNTER — Ambulatory Visit (HOSPITAL_BASED_OUTPATIENT_CLINIC_OR_DEPARTMENT_OTHER): Payer: Medicare Other | Admitting: Physician Assistant

## 2012-07-26 ENCOUNTER — Encounter: Payer: Self-pay | Admitting: Physician Assistant

## 2012-07-26 ENCOUNTER — Ambulatory Visit (HOSPITAL_BASED_OUTPATIENT_CLINIC_OR_DEPARTMENT_OTHER): Payer: Medicare Other

## 2012-07-26 ENCOUNTER — Other Ambulatory Visit (HOSPITAL_BASED_OUTPATIENT_CLINIC_OR_DEPARTMENT_OTHER): Payer: Medicare Other | Admitting: Lab

## 2012-07-26 ENCOUNTER — Telehealth: Payer: Self-pay | Admitting: Internal Medicine

## 2012-07-26 VITALS — BP 104/63 | HR 85 | Temp 97.4°F | Resp 18 | Ht 67.0 in | Wt 162.7 lb

## 2012-07-26 DIAGNOSIS — C779 Secondary and unspecified malignant neoplasm of lymph node, unspecified: Secondary | ICD-10-CM

## 2012-07-26 DIAGNOSIS — C787 Secondary malignant neoplasm of liver and intrahepatic bile duct: Secondary | ICD-10-CM

## 2012-07-26 DIAGNOSIS — C50919 Malignant neoplasm of unspecified site of unspecified female breast: Secondary | ICD-10-CM

## 2012-07-26 DIAGNOSIS — Z5111 Encounter for antineoplastic chemotherapy: Secondary | ICD-10-CM

## 2012-07-26 DIAGNOSIS — C259 Malignant neoplasm of pancreas, unspecified: Secondary | ICD-10-CM

## 2012-07-26 DIAGNOSIS — C252 Malignant neoplasm of tail of pancreas: Secondary | ICD-10-CM

## 2012-07-26 LAB — CBC WITH DIFFERENTIAL/PLATELET
BASO%: 0.6 % (ref 0.0–2.0)
EOS%: 4.6 % (ref 0.0–7.0)
MCH: 30.6 pg (ref 27.2–33.4)
MCHC: 32.4 g/dL (ref 32.0–36.0)
MCV: 94.6 fL (ref 79.3–98.0)
MONO%: 24.1 % — ABNORMAL HIGH (ref 0.0–14.0)
RDW: 20.1 % — ABNORMAL HIGH (ref 11.0–14.6)
lymph#: 1.4 10*3/uL (ref 0.9–3.3)

## 2012-07-26 MED ORDER — SODIUM CHLORIDE 0.9 % IJ SOLN
10.0000 mL | INTRAMUSCULAR | Status: AC | PRN
  Administered 2012-07-26: 10 mL
  Filled 2012-07-26: qty 10

## 2012-07-26 MED ORDER — GEMCITABINE HCL CHEMO INJECTION 1 GM/26.3ML
800.0000 mg/m2 | Freq: Once | INTRAVENOUS | Status: AC
  Administered 2012-07-26: 1444 mg via INTRAVENOUS
  Filled 2012-07-26: qty 38

## 2012-07-26 MED ORDER — SODIUM CHLORIDE 0.9 % IV SOLN
Freq: Once | INTRAVENOUS | Status: AC
  Administered 2012-07-26: 15:00:00 via INTRAVENOUS

## 2012-07-26 MED ORDER — PROCHLORPERAZINE MALEATE 10 MG PO TABS
10.0000 mg | ORAL_TABLET | Freq: Once | ORAL | Status: AC
Start: 2012-07-26 — End: 2012-07-26
  Administered 2012-07-26: 10 mg via ORAL

## 2012-07-26 MED ORDER — HEPARIN SOD (PORK) LOCK FLUSH 100 UNIT/ML IV SOLN
500.0000 [IU] | Freq: Once | INTRAVENOUS | Status: AC | PRN
  Administered 2012-07-26: 500 [IU]
  Filled 2012-07-26: qty 5

## 2012-07-26 MED ORDER — PACLITAXEL PROTEIN-BOUND CHEMO INJECTION 100 MG
80.0000 mg/m2 | Freq: Once | INTRAVENOUS | Status: AC
  Administered 2012-07-26: 150 mg via INTRAVENOUS
  Filled 2012-07-26: qty 30

## 2012-07-26 NOTE — Telephone Encounter (Signed)
gv and printed appt schedule for pt for Dec  °

## 2012-07-26 NOTE — Patient Instructions (Addendum)
[facility] Discharge Instructions for Patients Receiving Chemotherapy  Today you received the following chemotherapy agents Abraxane/Gemzar.  To help prevent nausea and vomiting after your treatment, we encourage you to take your nausea medication.  If you develop nausea and vomiting that is not controlled by your nausea medication, call the clinic. If it is after clinic hours your family physician or the after hours number for the clinic or go to the Emergency Department.   BELOW ARE SYMPTOMS THAT SHOULD BE REPORTED IMMEDIATELY:  *FEVER GREATER THAN 100.5 F  *CHILLS WITH OR WITHOUT FEVER  NAUSEA AND VOMITING THAT IS NOT CONTROLLED WITH YOUR NAUSEA MEDICATION  *UNUSUAL SHORTNESS OF BREATH  *UNUSUAL BRUISING OR BLEEDING  TENDERNESS IN MOUTH AND THROAT WITH OR WITHOUT PRESENCE OF ULCERS  *URINARY PROBLEMS  *BOWEL PROBLEMS  UNUSUAL RASH Items with * indicate a potential emergency and should be followed up as soon as possible.  Feel free to call the clinic you have any questions or concerns. The clinic phone number is [phone].

## 2012-07-26 NOTE — Patient Instructions (Addendum)
Follow up in 2 weeks prior to your next cycle of chemotherapy 

## 2012-07-26 NOTE — Telephone Encounter (Signed)
Per staff phone call and POF I have scheduled appts. [name]  

## 2012-08-02 ENCOUNTER — Other Ambulatory Visit (HOSPITAL_BASED_OUTPATIENT_CLINIC_OR_DEPARTMENT_OTHER): Payer: Medicare Other

## 2012-08-02 ENCOUNTER — Ambulatory Visit (HOSPITAL_BASED_OUTPATIENT_CLINIC_OR_DEPARTMENT_OTHER): Payer: Medicare Other

## 2012-08-02 DIAGNOSIS — C779 Secondary and unspecified malignant neoplasm of lymph node, unspecified: Secondary | ICD-10-CM

## 2012-08-02 DIAGNOSIS — C252 Malignant neoplasm of tail of pancreas: Secondary | ICD-10-CM

## 2012-08-02 DIAGNOSIS — C259 Malignant neoplasm of pancreas, unspecified: Secondary | ICD-10-CM

## 2012-08-02 DIAGNOSIS — C787 Secondary malignant neoplasm of liver and intrahepatic bile duct: Secondary | ICD-10-CM

## 2012-08-02 DIAGNOSIS — Z5111 Encounter for antineoplastic chemotherapy: Secondary | ICD-10-CM

## 2012-08-02 LAB — CBC WITH DIFFERENTIAL/PLATELET
BASO%: 1 % (ref 0.0–2.0)
Basophils Absolute: 0 10*3/uL (ref 0.0–0.1)
MCHC: 32.3 g/dL (ref 32.0–36.0)
MONO#: 0.7 10*3/uL (ref 0.1–0.9)
RBC: 2.77 10*6/uL — ABNORMAL LOW (ref 4.20–5.82)
WBC: 3.1 10*3/uL — ABNORMAL LOW (ref 4.0–10.3)
lymph#: 1.2 10*3/uL (ref 0.9–3.3)
nRBC: 2 % — ABNORMAL HIGH (ref 0–0)

## 2012-08-02 LAB — COMPREHENSIVE METABOLIC PANEL (CC13)
ALT: 46 U/L (ref 0–55)
AST: 48 U/L — ABNORMAL HIGH (ref 5–34)
Albumin: 3 g/dL — ABNORMAL LOW (ref 3.5–5.0)
Calcium: 8.5 mg/dL (ref 8.4–10.4)
Chloride: 104 mEq/L (ref 98–107)
Creatinine: 0.7 mg/dL (ref 0.7–1.3)
Potassium: 4.4 mEq/L (ref 3.5–5.1)

## 2012-08-02 MED ORDER — PROCHLORPERAZINE MALEATE 10 MG PO TABS
10.0000 mg | ORAL_TABLET | Freq: Once | ORAL | Status: AC
  Administered 2012-08-02: 10 mg via ORAL

## 2012-08-02 MED ORDER — SODIUM CHLORIDE 0.9 % IJ SOLN
10.0000 mL | INTRAMUSCULAR | Status: DC | PRN
  Administered 2012-08-02: 10 mL via INTRAVENOUS
  Filled 2012-08-02: qty 10

## 2012-08-02 MED ORDER — SODIUM CHLORIDE 0.9 % IV SOLN
800.0000 mg/m2 | Freq: Once | INTRAVENOUS | Status: AC
  Administered 2012-08-02: 1444 mg via INTRAVENOUS
  Filled 2012-08-02: qty 38

## 2012-08-02 MED ORDER — PACLITAXEL PROTEIN-BOUND CHEMO INJECTION 100 MG
80.0000 mg/m2 | Freq: Once | INTRAVENOUS | Status: AC
  Administered 2012-08-02: 150 mg via INTRAVENOUS
  Filled 2012-08-02: qty 30

## 2012-08-02 MED ORDER — HEPARIN SOD (PORK) LOCK FLUSH 100 UNIT/ML IV SOLN
500.0000 [IU] | Freq: Once | INTRAVENOUS | Status: AC
  Administered 2012-08-02: 500 [IU] via INTRAVENOUS
  Filled 2012-08-02: qty 5

## 2012-08-02 MED ORDER — SODIUM CHLORIDE 0.9 % IV SOLN
Freq: Once | INTRAVENOUS | Status: AC
  Administered 2012-08-02: 15:00:00 via INTRAVENOUS

## 2012-08-02 NOTE — Patient Instructions (Signed)
Call MD for problems 

## 2012-08-02 NOTE — Progress Notes (Signed)
[facility] Telephone:([phone]   [phone]  OFFICE PROGRESS NOTE  [name], MD Dr. Sharman Crate Hamrick [address] [place] [address]  DIAGNOSIS: Metastatic pancreatic adenocarcinoma, now with metastatic disease to the liver and the abdominal wall, initially diagnosed with stage IIIB(T3, N1, M0) pancreatic tail adenocarcinoma in November of 2011.   PRIOR THERAPY:  #1 distal pancreatectomy and splenectomy on 06/25/2010  #2 adjuvant chemotherapy with gemcitabine started 08/20/2010 and completed 2 cycles on 12/02/2010.  #3 status post concurrent chemoradiation with Xeloda from 12/15/2010 on 10 01/24/2011.  #4 status post 2 more cycles of adjuvant gemcitabine with gemcitabine completed on 04/04/2011.  #5 FOLFOX giving every 2 weeks status post 12 cycles.  #6 Continuous infusion 5-FU and leucovorin every 2 weeks. Status post 4 cycles discontinued today secondary to disease progression.   CURRENT THERAPY: The patient was started on 05/03/2012 the first cycle of systemic chemotherapy with gemcitabine 800 mg/M2 in addition to Abraxane 80 mg/M2 on weekly basis for 3 out of every 4 weeks. Status post 9 cycles.  INTERVAL HISTORY: Philip Shaw 77 y.o. male returns to the clinic today for followup visit accompanied his wife. The patient tolerated the last few cycles of his systemic chemotherapy with gemcitabine and Abraxane fairly well with no significant complaints. He voices no specific complaints today. His peripheral neuropathy is at baseline.The patient denied having any significant weight loss or night sweats. His appetite continues to be fair at best. He denied having any nausea or vomiting. He has no fever or chills.  MEDICAL HISTORY: Past Medical History  Diagnosis Date  . Heart murmur   . Abdominal pain, periumbilical   . Constipation   . Diabetes mellitus   . Hypertension   . Sleep apnea     pt states he no longer uses his cpap -  lost weight  . pancreatic ca dx'd 04/2010    METASTATIC PANCREATIC CA WITH METS TO LIVER AND ABDOMINAL WALL  -chemo every 2 weeks - ONCOLOGIST DR. MOHAMED MOHAMED WITH [facility].  Marland Kitchen Port-a-cath in place     RIGHT UPPER CHEST - FOR CHEMO EVERY 2 WEEKS    ALLERGIES:   has no known allergies.  MEDICATIONS:  Current Outpatient Prescriptions  Medication Sig Dispense Refill  . Alum & Mag Hydroxide-Simeth (MAGIC MOUTHWASH) SOLN Take 5 mLs by mouth QID.  240 mL  0  . aspirin 81 MG tablet Take 81 mg by mouth every morning.       . gabapentin (NEURONTIN) 100 MG capsule Take 1 capsule (100 mg total) by mouth 3 (three) times daily.  90 capsule  1  . insulin glargine (LANTUS) 100 UNIT/ML injection Inject into the skin at bedtime. 32 units every night except week of chemo pt would increase to 37      . lidocaine-prilocaine (EMLA) cream Apply 1 application topically as needed.  30 g  2  . magnesium hydroxide (MILK OF MAGNESIA) 400 MG/5ML suspension Take by mouth daily.       . methocarbamol (ROBAXIN) 500 MG tablet       . mirtazapine (REMERON) 30 MG tablet Take 1 tablet (30 mg total) by mouth at bedtime.  90 tablet  0  . ondansetron (ZOFRAN) 8 MG tablet Take 1 tablet (8 mg total) by mouth every 12 (twelve) hours as needed for nausea.  60 tablet  2  . oxyCODONE (OXY IR/ROXICODONE) 5 MG immediate release tablet Take 1 tablet (  5 mg total) by mouth every 4 (four) hours as needed.  30 tablet  0  . oxyCODONE (OXYCONTIN) 10 MG 12 hr tablet Take 1 tablet (10 mg total) by mouth every 12 (twelve) hours.  60 tablet  0  . oxyCODONE-acetaminophen (PERCOCET) 10-325 MG per tablet       . pravastatin (PRAVACHOL) 20 MG tablet Take 20 mg by mouth at bedtime.       . ranitidine (ZANTAC) 150 MG tablet Take 150 mg by mouth 2 (two) times daily.       . Tamsulosin HCl (FLOMAX) 0.4 MG CAPS Once or twice daily      . terazosin (HYTRIN) 2 MG capsule Take 2 mg by mouth 2 (two) times daily.       . [DISCONTINUED]  metFORMIN (GLUCOPHAGE) 850 MG tablet Take 850 mg by mouth 2 (two) times daily with a meal.        No current facility-administered medications for this visit.   Facility-Administered Medications Ordered in Other Visits  Medication Dose Route Frequency Provider Last Rate Last Dose  . sodium chloride 0.9 % injection 10 mL  10 mL Intracatheter PRN Si Gaul, MD   10 mL at 07/26/12 1715    SURGICAL HISTORY:  Past Surgical History  Procedure Date  . Appendectomy 90-45 years old  . Pancreatectomy 06/2010    with splenectomy  . Inguinal hernia repair as infant, age 34    LIH- infant, RIH age 33  . Knee arthroscopy 04/23/2012    Procedure: ARTHROSCOPY KNEE;  Surgeon: Loanne Drilling, MD;  Location: [facility];  Service: Orthopedics;  Laterality: Right;  with Debridement    REVIEW OF SYSTEMS:  A comprehensive review of systems was negative except for: Constitutional: positive for fatigue Neurological: positive for paresthesia   PHYSICAL EXAMINATION: General appearance: alert, cooperative and no distress Head: Normocephalic, without obvious abnormality, atraumatic Neck: no adenopathy Lymph nodes: Cervical, supraclavicular, and axillary nodes normal. Resp: clear to auscultation bilaterally Cardio: regular rate and rhythm, S1, S2 normal, no murmur, click, rub or gallop GI: soft, non-tender; bowel sounds normal; no masses,  no organomegaly Extremities: extremities normal, atraumatic, no cyanosis or edema Neurologic: Alert and oriented X 3, normal strength and tone. Normal symmetric reflexes. Normal coordination and gait  ECOG PERFORMANCE STATUS: 1 - Symptomatic but completely ambulatory  Blood pressure 104/63, pulse 85, temperature 97.4 F (36.3 C), temperature source Oral, resp. rate 18, height 5\' 7"  (1.702 m), weight 162 lb 11.2 oz (73.8 kg).  LABORATORY DATA: Lab Results  Component Value Date   WBC 6.6 07/26/2012   HGB 9.1* 07/26/2012   HCT 28.1* 07/26/2012   MCV 94.6 07/26/2012   PLT  593* 07/26/2012      Chemistry      Component Value Date/Time   NA 136 07/12/2012 0918   NA 136 04/19/2012 0930   K 4.4 07/12/2012 0918   K 4.3 04/19/2012 0930   CL 105 07/12/2012 0918   CL 100 04/19/2012 0930   CO2 28 07/12/2012 0918   CO2 28 04/19/2012 0930   BUN 21.0 07/12/2012 0918   BUN 18 04/19/2012 0930   CREATININE 0.7 07/12/2012 0918   CREATININE 0.76 04/19/2012 0930      Component Value Date/Time   CALCIUM 9.0 07/12/2012 0918   CALCIUM 9.2 04/19/2012 0930   ALKPHOS 268* 07/12/2012 0918   ALKPHOS 249* 04/19/2012 0930   AST 41* 07/12/2012 0918   AST 45* 04/19/2012 0930   ALT 46 07/12/2012 0454  ALT 42 04/19/2012 0930   BILITOT 0.43 07/12/2012 0918   BILITOT 0.6 04/19/2012 0930       RADIOGRAPHIC STUDIES: Ct Chest W Contrast  06/24/2012  *RADIOLOGY REPORT*  Clinical Data:  Follow-up pancreatic carcinoma.  Currently undergoing chemotherapy.  Restaging.  CT CHEST, ABDOMEN AND PELVIS WITH CONTRAST  Technique:  Multidetector CT imaging of the chest, abdomen and pelvis was performed following the standard protocol during bolus administration of intravenous contrast.  Contrast: 80mL OMNIPAQUE IOHEXOL 300 MG/ML  SOLN  Comparison:  04/12/2012   CT CHEST  Findings:  No evidence of mediastinal or hilar masses.  No adenopathy seen elsewhere within the thorax.  No evidence of pleural or pericardial effusion.  No suspicious pulmonary nodules or masses are identified.  No evidence of pulmonary infiltrate.  No central endobronchial lesion identified.  Healing fracture of the inferior right scapular body again noted.   No other suspicious bone lesions identified.  IMPRESSION:  1.  Healing fracture of the inferior right scapula again noted. Pathologic fracture cannot definitely be excluded. 2.  No other sites of metastatic disease within the thorax.   CT ABDOMEN AND PELVIS  Findings:  2.1 by 1.5 cm low attenuation lesion in the left hepatic lobe remains stable.  No new or enlarging liver masses are  identified.  Pneumobilia again demonstrated.  Calcifications again noted in the pancreatic head and neck, and postop changes from distal pancreatectomy and splenectomy are stable.  No pancreatic mass or peripancreatic lymphadenopathy identified.  Small bilateral renal calculi and cysts are stable, and there is no evidence of renal mass or hydronephrosis.  Both adrenal glands are normal appearance.  Soft tissue stranding and nodularity in the omental fat in the region of the transverse colon and splenic flexure remains stable. No new or enlarging masses are identified.  Mildly enlarged prostate gland is stable.  No evidence of ascites.  No evidence of inflammatory process or abscess.  Diverticulosis of the sigmoid colon again demonstrated, however there is no evidence of diverticulitis.  No evidence of bowel obstruction or hernia.  No suspicious bone lesions are identified.  IMPRESSION:  1.  Stable 2.1 cm left hepatic lobe mass, consistent with liver metastasis. 2.  Stable intraperitoneal disease involving the omental fat in the central and left abdomen.  No evidence of ascites. 3.  No new or progressive disease identified within the abdomen or pelvis. 4.  Stable enlarged prostate and bilateral nonobstructing nephrolithiasis.   Original Report Authenticated By: Myles Rosenthal, M.D.    ASSESSMENT/PLAN: This is a very pleasant 77 years old white male with metastatic pancreatic adenocarcinoma currently on treatment with gemcitabine and Abraxane status post 9 weekly doses. The patient is doing fine and he has stable disease on the recent CT scan of the chest, abdomen and pelvis. Patient was discussed with Dr. Arbutus Ped. He will continue with his current chemotherapy in the form of gemcitabine and Abraxane. He'll followup in 2 weeks beginning of the next cycle.   Laural Benes, Philip Schmuck Shaw, Philip Shaw   All questions were answered. The patient knows to call the clinic with any problems, questions or concerns. We can certainly see the  patient much sooner if necessary.  I spent 20  minutes counseling the patient face to face. The total time spent in the appointment was 30 minutes.

## 2012-08-09 ENCOUNTER — Telehealth: Payer: Self-pay | Admitting: *Deleted

## 2012-08-09 ENCOUNTER — Telehealth: Payer: Self-pay | Admitting: Internal Medicine

## 2012-08-09 ENCOUNTER — Other Ambulatory Visit (HOSPITAL_BASED_OUTPATIENT_CLINIC_OR_DEPARTMENT_OTHER): Payer: Medicare Other | Admitting: Lab

## 2012-08-09 ENCOUNTER — Ambulatory Visit (HOSPITAL_BASED_OUTPATIENT_CLINIC_OR_DEPARTMENT_OTHER): Payer: Medicare Other

## 2012-08-09 ENCOUNTER — Ambulatory Visit (HOSPITAL_BASED_OUTPATIENT_CLINIC_OR_DEPARTMENT_OTHER): Payer: Medicare Other | Admitting: Internal Medicine

## 2012-08-09 VITALS — BP 121/62 | HR 92 | Temp 97.4°F | Resp 20 | Ht 67.0 in | Wt 163.7 lb

## 2012-08-09 DIAGNOSIS — C252 Malignant neoplasm of tail of pancreas: Secondary | ICD-10-CM

## 2012-08-09 DIAGNOSIS — C259 Malignant neoplasm of pancreas, unspecified: Secondary | ICD-10-CM

## 2012-08-09 DIAGNOSIS — C787 Secondary malignant neoplasm of liver and intrahepatic bile duct: Secondary | ICD-10-CM

## 2012-08-09 DIAGNOSIS — T451X5A Adverse effect of antineoplastic and immunosuppressive drugs, initial encounter: Secondary | ICD-10-CM

## 2012-08-09 DIAGNOSIS — Z5111 Encounter for antineoplastic chemotherapy: Secondary | ICD-10-CM

## 2012-08-09 DIAGNOSIS — D6481 Anemia due to antineoplastic chemotherapy: Secondary | ICD-10-CM

## 2012-08-09 LAB — COMPREHENSIVE METABOLIC PANEL (CC13)
ALT: 38 U/L (ref 0–55)
BUN: 21 mg/dL (ref 7.0–26.0)
CO2: 26 mEq/L (ref 22–29)
Calcium: 8.5 mg/dL (ref 8.4–10.4)
Chloride: 102 mEq/L (ref 98–107)
Creatinine: 0.8 mg/dL (ref 0.7–1.3)
Glucose: 98 mg/dl (ref 70–99)
Total Bilirubin: 0.39 mg/dL (ref 0.20–1.20)

## 2012-08-09 LAB — CBC WITH DIFFERENTIAL/PLATELET
Basophils Absolute: 0 10*3/uL (ref 0.0–0.1)
Eosinophils Absolute: 0.2 10*3/uL (ref 0.0–0.5)
HCT: 25.7 % — ABNORMAL LOW (ref 38.4–49.9)
LYMPH%: 23.8 % (ref 14.0–49.0)
MCV: 93.8 fL (ref 79.3–98.0)
MONO#: 0.7 10*3/uL (ref 0.1–0.9)
MONO%: 19.3 % — ABNORMAL HIGH (ref 0.0–14.0)
NEUT#: 1.8 10*3/uL (ref 1.5–6.5)
NEUT%: 51.1 % (ref 39.0–75.0)
Platelets: 139 10*3/uL — ABNORMAL LOW (ref 140–400)
WBC: 3.6 10*3/uL — ABNORMAL LOW (ref 4.0–10.3)

## 2012-08-09 MED ORDER — HEPARIN SOD (PORK) LOCK FLUSH 100 UNIT/ML IV SOLN
500.0000 [IU] | Freq: Once | INTRAVENOUS | Status: AC | PRN
  Administered 2012-08-09: 500 [IU]
  Filled 2012-08-09: qty 5

## 2012-08-09 MED ORDER — SODIUM CHLORIDE 0.9 % IV SOLN
800.0000 mg/m2 | Freq: Once | INTRAVENOUS | Status: AC
  Administered 2012-08-09: 1444 mg via INTRAVENOUS
  Filled 2012-08-09: qty 38

## 2012-08-09 MED ORDER — SODIUM CHLORIDE 0.9 % IJ SOLN
10.0000 mL | INTRAMUSCULAR | Status: DC | PRN
  Administered 2012-08-09: 10 mL
  Filled 2012-08-09: qty 10

## 2012-08-09 MED ORDER — PACLITAXEL PROTEIN-BOUND CHEMO INJECTION 100 MG
80.0000 mg/m2 | Freq: Once | INTRAVENOUS | Status: AC
  Administered 2012-08-09: 150 mg via INTRAVENOUS
  Filled 2012-08-09: qty 30

## 2012-08-09 MED ORDER — PROCHLORPERAZINE EDISYLATE 5 MG/ML IJ SOLN
10.0000 mg | Freq: Once | INTRAMUSCULAR | Status: AC
  Administered 2012-08-09: 10 mg via INTRAVENOUS

## 2012-08-09 MED ORDER — PROCHLORPERAZINE MALEATE 10 MG PO TABS: 10.0000 mg | ORAL_TABLET | Freq: Once | ORAL | Status: DC

## 2012-08-09 MED ORDER — SODIUM CHLORIDE 0.9 % IV SOLN
Freq: Once | INTRAVENOUS | Status: AC
  Administered 2012-08-09: 12:00:00 via INTRAVENOUS

## 2012-08-09 NOTE — Patient Instructions (Signed)
You still has mild anemia on the blood work but you are currently asymptomatic. Followup in 2 weeks with repeat CT scan of the chest, abdomen and pelvis.

## 2012-08-09 NOTE — Telephone Encounter (Signed)
Per staff phone call and POF I have scheduled appts. [name]  

## 2012-08-09 NOTE — Telephone Encounter (Signed)
Gave pt appt for December 2013 lab/chemo/MD and January 2014 lab and chemo

## 2012-08-09 NOTE — Patient Instructions (Addendum)
[facility] Discharge Instructions for Patients Receiving Chemotherapy  Today you received the following chemotherapy agents gemzar /abraxane  To help prevent nausea and vomiting after your treatment, we encourage you to take your nausea medication  and take it as often as prescribed   If you develop nausea and vomiting that is not controlled by your nausea medication, call the clinic. If it is after clinic hours your family physician or the after hours number for the clinic or go to the Emergency Department.   BELOW ARE SYMPTOMS THAT SHOULD BE REPORTED IMMEDIATELY:  *FEVER GREATER THAN 100.5 F  *CHILLS WITH OR WITHOUT FEVER  NAUSEA AND VOMITING THAT IS NOT CONTROLLED WITH YOUR NAUSEA MEDICATION  *UNUSUAL SHORTNESS OF BREATH  *UNUSUAL BRUISING OR BLEEDING  TENDERNESS IN MOUTH AND THROAT WITH OR WITHOUT PRESENCE OF ULCERS  *URINARY PROBLEMS  *BOWEL PROBLEMS  UNUSUAL RASH Items with * indicate a potential emergency and should be followed up as soon as possible.  One of the nurses will contact you 24 hours after your treatment. Please let the nurse know about any problems that you may have experienced. Feel free to call the clinic you have any questions or concerns. The clinic phone number is [phone].   I have been informed and understand all the instructions given to me. I know to contact the clinic, my physician, or go to the Emergency Department if any problems should occur. I do not have any questions at this time, but understand that I may call the clinic during office hours   should I have any questions or need assistance in obtaining follow up care.    __________________________________________  _____________  __________ Signature of Patient or Authorized Representative            Date                   Time    __________________________________________ Nurse's Signature    

## 2012-08-09 NOTE — Progress Notes (Signed)
[facility] Telephone:([phone]) [phone]   [phone]) [phone]  OFFICE PROGRESS NOTE  [name], MD Dr. Sharman Crate Hamrick [address] [place] [address]  DIAGNOSIS: Metastatic pancreatic adenocarcinoma, now with metastatic disease to the liver and the abdominal wall, initially diagnosed with stage IIIB(T3, N1, M0) pancreatic tail adenocarcinoma in November of 2011.   PRIOR THERAPY:  #1 distal pancreatectomy and splenectomy on 06/25/2010  #2 adjuvant chemotherapy with gemcitabine started 08/20/2010 and completed 2 cycles on 12/02/2010.  #3 status post concurrent chemoradiation with Xeloda from 12/15/2010 on 10 01/24/2011.  #4 status post 2 more cycles of adjuvant gemcitabine with gemcitabine completed on 04/04/2011.  #5 FOLFOX giving every 2 weeks status post 12 cycles.  #6 Continuous infusion 5-FU and leucovorin every 2 weeks. Status post 4 cycles discontinued today secondary to disease progression.   CURRENT THERAPY: The patient was started on 05/03/2012 the first cycle of systemic chemotherapy with gemcitabine 800 mg/M2 in addition to Abraxane 80 mg/M2 on weekly basis for 3 out of every 4 weeks. Status post 11 cycles.  INTERVAL HISTORY: Philip Shaw 77 y.o. male returns to the clinic today for followup visit accompanied his wife. The patient is feeling fine today with no specific complaints. He is rating his treatment with gemcitabine and Abraxane fairly well with no significant adverse effects except for mild fatigue. He denied having any significant nausea or vomiting, no fever or chills. The patient denied having any significant chest pain, shortness of breath, cough or hemoptysis.  MEDICAL HISTORY: Past Medical History  Diagnosis Date  . Heart murmur   . Abdominal pain, periumbilical   . Constipation   . Diabetes mellitus   . Hypertension   . Sleep apnea     pt states he no longer uses his cpap - lost weight  . pancreatic ca dx'd 04/2010   METASTATIC PANCREATIC CA WITH METS TO LIVER AND ABDOMINAL WALL  -chemo every 2 weeks - ONCOLOGIST DR. Keenon Leitzel WITH [facility].  Marland Kitchen Port-a-cath in place     RIGHT UPPER CHEST - FOR CHEMO EVERY 2 WEEKS    ALLERGIES:   has no known allergies.  MEDICATIONS:  Current Outpatient Prescriptions  Medication Sig Dispense Refill  . Alum & Mag Hydroxide-Simeth (MAGIC MOUTHWASH) SOLN Take 5 mLs by mouth QID.  240 mL  0  . aspirin 81 MG tablet Take 81 mg by mouth every morning.       . gabapentin (NEURONTIN) 100 MG capsule Take 1 capsule (100 mg total) by mouth 3 (three) times daily.  90 capsule  1  . insulin glargine (LANTUS) 100 UNIT/ML injection Inject into the skin at bedtime. 32 units every night except week of chemo pt would increase to 37      . lidocaine-prilocaine (EMLA) cream Apply 1 application topically as needed.  30 g  2  . magnesium hydroxide (MILK OF MAGNESIA) 400 MG/5ML suspension Take by mouth daily.       . methocarbamol (ROBAXIN) 500 MG tablet       . mirtazapine (REMERON) 30 MG tablet Take 1 tablet (30 mg total) by mouth at bedtime.  90 tablet  0  . ondansetron (ZOFRAN) 8 MG tablet Take 1 tablet (8 mg total) by mouth every 12 (twelve) hours as needed for nausea.  60 tablet  2  . oxyCODONE (OXY IR/ROXICODONE) 5 MG immediate release tablet Take 1 tablet (5 mg total) by mouth every 4 (four) hours as  needed.  30 tablet  0  . oxyCODONE (OXYCONTIN) 10 MG 12 hr tablet Take 1 tablet (10 mg total) by mouth every 12 (twelve) hours.  60 tablet  0  . oxyCODONE-acetaminophen (PERCOCET) 10-325 MG per tablet       . pravastatin (PRAVACHOL) 20 MG tablet Take 20 mg by mouth at bedtime.       . ranitidine (ZANTAC) 150 MG tablet Take 150 mg by mouth 2 (two) times daily.       . Tamsulosin HCl (FLOMAX) 0.4 MG CAPS Once or twice daily      . terazosin (HYTRIN) 2 MG capsule Take 2 mg by mouth 2 (two) times daily.       . [DISCONTINUED] metFORMIN (GLUCOPHAGE) 850 MG tablet Take 850 mg  by mouth 2 (two) times daily with a meal.        No current facility-administered medications for this visit.   Facility-Administered Medications Ordered in Other Visits  Medication Dose Route Frequency Provider Last Rate Last Dose  . sodium chloride 0.9 % injection 10 mL  10 mL Intracatheter PRN Si Gaul, MD   10 mL at 07/26/12 1715    SURGICAL HISTORY:  Past Surgical History  Procedure Date  . Appendectomy 41-1 years old  . Pancreatectomy 06/2010    with splenectomy  . Inguinal hernia repair as infant, age 52    LIH- infant, RIH age 60  . Knee arthroscopy 04/23/2012    Procedure: ARTHROSCOPY KNEE;  Surgeon: Loanne Drilling, MD;  Location: [facility];  Service: Orthopedics;  Laterality: Right;  with Debridement    REVIEW OF SYSTEMS:  A comprehensive review of systems was negative except for: Constitutional: positive for fatigue   PHYSICAL EXAMINATION: General appearance: alert, cooperative, fatigued and no distress Head: Normocephalic, without obvious abnormality, atraumatic Neck: no adenopathy Lymph nodes: Cervical, supraclavicular, and axillary nodes normal. Resp: clear to auscultation bilaterally Cardio: regular rate and rhythm, S1, S2 normal, no murmur, click, rub or gallop GI: soft, non-tender; bowel sounds normal; no masses,  no organomegaly Extremities: extremities normal, atraumatic, no cyanosis or edema Neurologic: Alert and oriented X 3, normal strength and tone. Normal symmetric reflexes. Normal coordination and gait  ECOG PERFORMANCE STATUS: 1 - Symptomatic but completely ambulatory  Blood pressure 121/62, pulse 92, temperature 97.4 F (36.3 C), temperature source Oral, resp. rate 20, height 5\' 7"  (1.702 m), weight 163 lb 11.2 oz (74.254 kg).  LABORATORY DATA: Lab Results  Component Value Date   WBC 3.1* 08/02/2012   HGB 8.4* 08/02/2012   HCT 26.0* 08/02/2012   MCV 93.9 08/02/2012   PLT 386 08/02/2012      Chemistry      Component Value Date/Time   NA 135*  08/02/2012 1415   NA 136 04/19/2012 0930   K 4.4 08/02/2012 1415   K 4.3 04/19/2012 0930   CL 104 08/02/2012 1415   CL 100 04/19/2012 0930   CO2 25 08/02/2012 1415   CO2 28 04/19/2012 0930   BUN 19.0 08/02/2012 1415   BUN 18 04/19/2012 0930   CREATININE 0.7 08/02/2012 1415   CREATININE 0.76 04/19/2012 0930      Component Value Date/Time   CALCIUM 8.5 08/02/2012 1415   CALCIUM 9.2 04/19/2012 0930   ALKPHOS 228* 08/02/2012 1415   ALKPHOS 249* 04/19/2012 0930   AST 48* 08/02/2012 1415   AST 45* 04/19/2012 0930   ALT 46 08/02/2012 1415   ALT 42 04/19/2012 0930   BILITOT 0.48 08/02/2012 1415  BILITOT 0.6 04/19/2012 0930       RADIOGRAPHIC STUDIES: No results found.  ASSESSMENT: This is a very pleasant 77 years old white male with metastatic pancreatic adenocarcinoma currently undergoing systemic chemotherapy with gemcitabine and Abraxane status post 11 cycles. The patient is doing fine and tolerating his treatment fairly well.   PLAN: We'll proceed with cycle #12 today as scheduled. The patient would come back for followup visit in 2 weeks with repeat CT scan of the chest, abdomen and pelvis for restaging of his disease. The patient has chemotherapy-induced anemia and I would consider him for packed rbc's transfusion if his hemoglobin less than 8 g/dL or if he becomes symptomatic.  All questions were answered. The patient knows to call the clinic with any problems, questions or concerns. We can certainly see the patient much sooner if necessary.  I spent 15 minutes counseling the patient face to face. The total time spent in the appointment was 25 minutes.

## 2012-08-16 ENCOUNTER — Ambulatory Visit: Payer: BC Managed Care – PPO

## 2012-08-16 ENCOUNTER — Other Ambulatory Visit: Payer: BC Managed Care – PPO | Admitting: Lab

## 2012-08-16 ENCOUNTER — Ambulatory Visit: Payer: Medicare Other

## 2012-08-20 ENCOUNTER — Other Ambulatory Visit: Payer: BC Managed Care – PPO | Admitting: Lab

## 2012-08-23 ENCOUNTER — Ambulatory Visit (HOSPITAL_BASED_OUTPATIENT_CLINIC_OR_DEPARTMENT_OTHER): Payer: Medicare Other | Admitting: Internal Medicine

## 2012-08-23 ENCOUNTER — Ambulatory Visit (HOSPITAL_COMMUNITY)
Admission: RE | Admit: 2012-08-23 | Discharge: 2012-08-23 | Disposition: A | Discharge status: Home / Self Care | Payer: Medicare Other | Source: Ambulatory Visit | Attending: Internal Medicine | Admitting: Internal Medicine

## 2012-08-23 ENCOUNTER — Other Ambulatory Visit: Payer: Self-pay | Admitting: Internal Medicine

## 2012-08-23 ENCOUNTER — Encounter: Payer: Self-pay | Admitting: Internal Medicine

## 2012-08-23 ENCOUNTER — Ambulatory Visit (HOSPITAL_BASED_OUTPATIENT_CLINIC_OR_DEPARTMENT_OTHER): Payer: Medicare Other

## 2012-08-23 ENCOUNTER — Other Ambulatory Visit (HOSPITAL_BASED_OUTPATIENT_CLINIC_OR_DEPARTMENT_OTHER): Payer: Medicare Other | Admitting: Lab

## 2012-08-23 VITALS — BP 136/70 | HR 83 | Temp 97.0°F | Resp 20 | Ht 67.0 in | Wt 163.6 lb

## 2012-08-23 DIAGNOSIS — C252 Malignant neoplasm of tail of pancreas: Secondary | ICD-10-CM

## 2012-08-23 DIAGNOSIS — R52 Pain, unspecified: Secondary | ICD-10-CM

## 2012-08-23 DIAGNOSIS — C259 Malignant neoplasm of pancreas, unspecified: Secondary | ICD-10-CM | POA: Insufficient documentation

## 2012-08-23 DIAGNOSIS — C786 Secondary malignant neoplasm of retroperitoneum and peritoneum: Secondary | ICD-10-CM

## 2012-08-23 DIAGNOSIS — C787 Secondary malignant neoplasm of liver and intrahepatic bile duct: Secondary | ICD-10-CM

## 2012-08-23 DIAGNOSIS — Z5111 Encounter for antineoplastic chemotherapy: Secondary | ICD-10-CM

## 2012-08-23 DIAGNOSIS — I2510 Atherosclerotic heart disease of native coronary artery without angina pectoris: Secondary | ICD-10-CM | POA: Insufficient documentation

## 2012-08-23 LAB — COMPREHENSIVE METABOLIC PANEL (CC13)
ALT: 38 U/L (ref 0–55)
AST: 32 U/L (ref 5–34)
Albumin: 3 g/dL — ABNORMAL LOW (ref 3.5–5.0)
Alkaline Phosphatase: 248 U/L — ABNORMAL HIGH (ref 40–150)
Glucose: 50 mg/dl — ABNORMAL LOW (ref 70–99)
Potassium: 4.1 mEq/L (ref 3.5–5.1)
Sodium: 137 mEq/L (ref 136–145)
Total Protein: 6.5 g/dL (ref 6.4–8.3)

## 2012-08-23 LAB — CBC WITH DIFFERENTIAL/PLATELET
BASO%: 0.6 % (ref 0.0–2.0)
HCT: 28.7 % — ABNORMAL LOW (ref 38.4–49.9)
HGB: 9.3 g/dL — ABNORMAL LOW (ref 13.0–17.1)
MCHC: 32.4 g/dL (ref 32.0–36.0)
MONO#: 1.2 10*3/uL — ABNORMAL HIGH (ref 0.1–0.9)
NEUT%: 51.4 % (ref 39.0–75.0)
WBC: 5.2 10*3/uL (ref 4.0–10.3)
lymph#: 1.1 10*3/uL (ref 0.9–3.3)

## 2012-08-23 MED ORDER — PACLITAXEL PROTEIN-BOUND CHEMO INJECTION 100 MG
80.0000 mg/m2 | Freq: Once | INTRAVENOUS | Status: AC
  Administered 2012-08-23: 150 mg via INTRAVENOUS
  Filled 2012-08-23: qty 30

## 2012-08-23 MED ORDER — HEPARIN SOD (PORK) LOCK FLUSH 100 UNIT/ML IV SOLN
500.0000 [IU] | Freq: Once | INTRAVENOUS | Status: AC | PRN
  Administered 2012-08-23: 500 [IU]
  Filled 2012-08-23: qty 5

## 2012-08-23 MED ORDER — PROCHLORPERAZINE EDISYLATE 5 MG/ML IJ SOLN
10.0000 mg | Freq: Once | INTRAMUSCULAR | Status: AC
  Administered 2012-08-23: 10 mg via INTRAVENOUS

## 2012-08-23 MED ORDER — IOHEXOL 300 MG/ML  SOLN
100.0000 mL | Freq: Once | INTRAMUSCULAR | Status: AC | PRN
  Administered 2012-08-23: 100 mL via INTRAVENOUS

## 2012-08-23 MED ORDER — SODIUM CHLORIDE 0.9 % IV SOLN
800.0000 mg/m2 | Freq: Once | INTRAVENOUS | Status: AC
  Administered 2012-08-23: 1444 mg via INTRAVENOUS
  Filled 2012-08-23: qty 38

## 2012-08-23 MED ORDER — OXYCODONE HCL 10 MG PO TB12: 10.0000 mg | ORAL_TABLET | Freq: Two times a day (BID) | ORAL | Status: DC

## 2012-08-23 MED ORDER — SODIUM CHLORIDE 0.9 % IJ SOLN
10.0000 mL | INTRAMUSCULAR | Status: DC | PRN
  Administered 2012-08-23: 10 mL
  Filled 2012-08-23: qty 10

## 2012-08-23 MED ORDER — SODIUM CHLORIDE 0.9 % IV SOLN
Freq: Once | INTRAVENOUS | Status: AC
  Administered 2012-08-23: 11:00:00 via INTRAVENOUS

## 2012-08-23 NOTE — Patient Instructions (Addendum)
[facility] Discharge Instructions for Patients Receiving Chemotherapy  Today you received the following chemotherapy agents: abraxane, gemzar   To help prevent nausea and vomiting after your treatment, we encourage you to take your nausea medication.  Take it as often as prescribed.     If you develop nausea and vomiting that is not controlled by your nausea medication, call the clinic. If it is after clinic hours your family physician or the after hours number for the clinic or go to the Emergency Department.   BELOW ARE SYMPTOMS THAT SHOULD BE REPORTED IMMEDIATELY:  *FEVER GREATER THAN 100.5 F  *CHILLS WITH OR WITHOUT FEVER  NAUSEA AND VOMITING THAT IS NOT CONTROLLED WITH YOUR NAUSEA MEDICATION  *UNUSUAL SHORTNESS OF BREATH  *UNUSUAL BRUISING OR BLEEDING  TENDERNESS IN MOUTH AND THROAT WITH OR WITHOUT PRESENCE OF ULCERS  *URINARY PROBLEMS  *BOWEL PROBLEMS  UNUSUAL RASH Items with * indicate a potential emergency and should be followed up as soon as possible.  Feel free to call the clinic you have any questions or concerns. The clinic phone number is [phone].   I have been informed and understand all the instructions given to me. I know to contact the clinic, my physician, or go to the Emergency Department if any problems should occur. I do not have any questions at this time, but understand that I may call the clinic during office hours   should I have any questions or need assistance in obtaining follow up care.    __________________________________________  _____________  __________ Signature of Patient or Authorized Representative            Date                   Time    __________________________________________ Nurse's Signature    

## 2012-08-23 NOTE — Progress Notes (Signed)
[facility] Telephone:([phone]) [phone]   Fax:([phone]) [phone]  OFFICE PROGRESS NOTE  [name], MD Dr. Sharman Crate Hamrick [address] [place] [address]  DIAGNOSIS: Metastatic pancreatic adenocarcinoma, now with metastatic disease to the liver and the abdominal wall, initially diagnosed with stage IIIB(T3, N1, M0) pancreatic tail adenocarcinoma in November of 2011.   PRIOR THERAPY:  #1 distal pancreatectomy and splenectomy on 06/25/2010  #2 adjuvant chemotherapy with gemcitabine started 08/20/2010 and completed 2 cycles on 12/02/2010.  #3 status post concurrent chemoradiation with Xeloda from 12/15/2010 on 10 01/24/2011.  #4 status post 2 more cycles of adjuvant gemcitabine with gemcitabine completed on 04/04/2011.  #5 FOLFOX giving every 2 weeks status post 12 cycles.  #6 Continuous infusion 5-FU and leucovorin every 2 weeks. Status post 4 cycles discontinued today secondary to disease progression.   CURRENT THERAPY: The patient was started on 05/03/2012 the first cycle of systemic chemotherapy with gemcitabine 800 mg/M2 in addition to Abraxane 80 mg/M2 on weekly basis for 3 out of every 4 weeks. Status post 13 cycles.   INTERVAL HISTORY: Philip Shaw 77 y.o. male returns to the clinic today for followup visit accompanied by his wife. The patient is feeling fine today with no specific complaints. He is tolerating his current treatment with gemcitabine and Abraxane fairly well with no significant adverse effect. He continues to have mild peripheral neuropathy in the fingers and toes but not worse than before. He denied having any significant weight loss or night sweats. He denied having any chest pain, shortness breath, cough or hemoptysis. The patient had repeat CT scan of the chest, abdomen and pelvis performed recently and he is here for evaluation and discussion of his scan results.  MEDICAL HISTORY: Past Medical History  Diagnosis Date  . Heart  murmur   . Abdominal pain, periumbilical   . Constipation   . Diabetes mellitus   . Hypertension   . Sleep apnea     pt states he no longer uses his cpap - lost weight  . pancreatic ca dx'd 04/2010    METASTATIC PANCREATIC CA WITH METS TO LIVER AND ABDOMINAL WALL  -chemo every 2 weeks - ONCOLOGIST DR. Marcea Rojek WITH [facility].  Marland Kitchen Port-a-cath in place     RIGHT UPPER CHEST - FOR CHEMO EVERY 2 WEEKS    ALLERGIES:   has no known allergies.  MEDICATIONS:  Current Outpatient Prescriptions  Medication Sig Dispense Refill  . Alum & Mag Hydroxide-Simeth (MAGIC MOUTHWASH) SOLN Take 5 mLs by mouth QID.  240 mL  0  . aspirin 81 MG tablet Take 81 mg by mouth every morning.       . gabapentin (NEURONTIN) 100 MG capsule Take 1 capsule (100 mg total) by mouth 3 (three) times daily.  90 capsule  1  . insulin glargine (LANTUS) 100 UNIT/ML injection Inject into the skin at bedtime. 32 units every night except week of chemo pt would increase to 37      . lidocaine-prilocaine (EMLA) cream Apply 1 application topically as needed.  30 g  2  . magnesium hydroxide (MILK OF MAGNESIA) 400 MG/5ML suspension Take by mouth daily.       . methocarbamol (ROBAXIN) 500 MG tablet       . mirtazapine (REMERON) 30 MG tablet Take 1 tablet (30 mg total) by mouth at bedtime.  90 tablet  0  . ondansetron (ZOFRAN) 8 MG tablet Take 1 tablet (8 mg  total) by mouth every 12 (twelve) hours as needed for nausea.  60 tablet  2  . oxyCODONE (OXY IR/ROXICODONE) 5 MG immediate release tablet Take 1 tablet (5 mg total) by mouth every 4 (four) hours as needed.  30 tablet  0  . oxyCODONE (OXYCONTIN) 10 MG 12 hr tablet Take 1 tablet (10 mg total) by mouth every 12 (twelve) hours.  60 tablet  0  . pravastatin (PRAVACHOL) 20 MG tablet Take 20 mg by mouth at bedtime.       . ranitidine (ZANTAC) 150 MG tablet Take 150 mg by mouth 2 (two) times daily.       . Tamsulosin HCl (FLOMAX) 0.4 MG CAPS Once or twice daily      .  terazosin (HYTRIN) 2 MG capsule Take 2 mg by mouth 2 (two) times daily.       . [DISCONTINUED] metFORMIN (GLUCOPHAGE) 850 MG tablet Take 850 mg by mouth 2 (two) times daily with a meal.        No current facility-administered medications for this visit.   Facility-Administered Medications Ordered in Other Visits  Medication Dose Route Frequency Provider Last Rate Last Dose  . sodium chloride 0.9 % injection 10 mL  10 mL Intracatheter PRN Si Gaul, MD   10 mL at 07/26/12 1715    SURGICAL HISTORY:  Past Surgical History  Procedure Date  . Appendectomy 54-11 years old  . Pancreatectomy 06/2010    with splenectomy  . Inguinal hernia repair as infant, age 36    LIH- infant, RIH age 13  . Knee arthroscopy 04/23/2012    Procedure: ARTHROSCOPY KNEE;  Surgeon: Loanne Drilling, MD;  Location: [facility];  Service: Orthopedics;  Laterality: Right;  with Debridement    REVIEW OF SYSTEMS:  A comprehensive review of systems was negative.   PHYSICAL EXAMINATION: General appearance: alert, cooperative and no distress Head: Normocephalic, without obvious abnormality, atraumatic Neck: no adenopathy Lymph nodes: Cervical, supraclavicular, and axillary nodes normal. Resp: clear to auscultation bilaterally Cardio: regular rate and rhythm, S1, S2 normal, no murmur, click, rub or gallop GI: soft, non-tender; bowel sounds normal; no masses,  no organomegaly Extremities: extremities normal, atraumatic, no cyanosis or edema Neurologic: Alert and oriented X 3, normal strength and tone. Normal symmetric reflexes. Normal coordination and gait  ECOG PERFORMANCE STATUS: 1 - Symptomatic but completely ambulatory  Blood pressure 136/70, pulse 83, temperature 97 F (36.1 C), temperature source Oral, resp. rate 20, height 5\' 7"  (1.702 m), weight 163 lb 9.6 oz (74.208 kg).  LABORATORY DATA: Lab Results  Component Value Date   WBC 5.2 08/23/2012   HGB 9.3* 08/23/2012   HCT 28.7* 08/23/2012   MCV 92.3  08/23/2012   PLT 649* 08/23/2012      Chemistry      Component Value Date/Time   NA 135* 08/09/2012 1118   NA 136 04/19/2012 0930   K 4.4 08/09/2012 1118   K 4.3 04/19/2012 0930   CL 102 08/09/2012 1118   CL 100 04/19/2012 0930   CO2 26 08/09/2012 1118   CO2 28 04/19/2012 0930   BUN 21.0 08/09/2012 1118   BUN 18 04/19/2012 0930   CREATININE 0.8 08/09/2012 1118   CREATININE 0.76 04/19/2012 0930      Component Value Date/Time   CALCIUM 8.5 08/09/2012 1118   CALCIUM 9.2 04/19/2012 0930   ALKPHOS 214* 08/09/2012 1118   ALKPHOS 249* 04/19/2012 0930   AST 36* 08/09/2012 1118   AST 45* 04/19/2012 0930  ALT 38 08/09/2012 1118   ALT 42 04/19/2012 0930   BILITOT 0.39 08/09/2012 1118   BILITOT 0.6 04/19/2012 0930       RADIOGRAPHIC STUDIES: CT CHEST, ABDOMEN AND PELVIS WITH CONTRAST  Technique: Multidetector CT imaging of the chest, abdomen and  pelvis was performed following the standard protocol during bolus  administration of intravenous contrast.  Contrast: OMNIPAQUE IOHEXOL 300 MG/ML SOLN  Comparison: CT of the chest and abdomen 06/24/2012.  CT CHEST  Findings:  Mediastinum: Heart size is normal. There is atherosclerosis of the  thoracic aorta, the great vessels of the mediastinum and the  coronary arteries, including calcified atherosclerotic plaque in  the the left anterior descending, circumflex and right coronary  arteries. There is no significant pericardial fluid, thickening or  pericardial calcification. No pathologically enlarged mediastinal  or hilar lymph nodes. Esophagus is unremarkable in appearance.  Right internal jugular single lumen Port-A-Cath with tip  terminating in the distal superior vena cava  Lungs/Pleura: There are a few scattered tiny calcified granulomas.  No larger more suspicious appearing pulmonary nodules or masses are  identified. No acute consolidative airspace disease. No pleural  effusions.  Musculoskeletal: There are no aggressive  appearing lytic or blastic  lesions noted in the visualized portions of the skeleton.  IMPRESSION:  1. No findings to suggest metastatic disease to the thorax at this  time.  2. Atherosclerosis, including three-vessel coronary artery disease.  CT ABDOMEN AND PELVIS  Findings:  Abdomen/Pelvis: The lesion in segment 2 of the liver is slightly  smaller than the prior examination measuring 1.7 x 1.1 cm (image 52  of series 5) and demonstrates a general low attenuation with some  central enhancement. No new hepatic lesions are otherwise noted.  Large amount of pneumobilia in the intrahepatic biliary tree, and  small amount within the gallbladder. The pancreas is remarkable  for multifocal subcentimeter low attenuation lesions in the head,  with extensive pancreatic calcifications in the head, which are  unchanged. Postoperative changes of distal pancreatectomy and  splenectomy are noted. The appearance of the adrenal glands  bilaterally is unremarkable. Multiple renal lesions are noted  bilaterally, the majority of which are low attenuation and do not  enhance, compatible with simple cysts. Several lesions are too  small to definitively characterize. There is an irregular shaped  lesion in the lower pole of the right kidney which is similar in  size and appearance, likely representing a septated cyst. Multiple  nonobstructive calculi are noted within the collecting systems of  the kidneys bilaterally, largest of which measures 5 mm in the  lower pole collecting system of the right kidney.  A similar appearing stranding and mild nodularity is noted in the  omental fat adjacent to the splenic flexure of the colon. How much  of this represents postoperative scarring versus treated peritoneal  metastases is uncertain. No definite new peritoneal mass or  nodularity is otherwise appreciated. No significant volume of  ascites. No pneumoperitoneum. No pathologic distension of small  bowel. There  are a few colonic diverticula, without surrounding  inflammatory changes to suggest acute diverticulitis at this time.  Prostate gland is mildly heterogeneous. Urinary bladder is  unremarkable in appearance.  Musculoskeletal: There are no aggressive appearing lytic or blastic  lesions noted in the visualized portions of the skeleton.  IMPRESSION:  1. Status post distal pancreatectomy without definitive evidence  to suggest local recurrence of disease. Lesion in segment 2 of the  liver appears slightly smaller  than the prior examination,  measuring 1.7 x 1.1 cm on today's study, and no new hepatic lesions  are noted. A small amount of omental soft tissue stranding and  nodularity predominately adjacent to the splenic flexure is similar  to the prior examination; how much of this represents omental  metastases versus postoperative scarring is uncertain. No new  sites of metastatic disease are identified on today's examination.  2. Additional incidental findings, as above, similar to prior  studies.  Original Report Authenticated By: Trudie Reed, M.D.    ASSESSMENT: This is a very pleasant 77 years old white male with history of metastatic pancreatic adenocarcinoma currently on treatment with gemcitabine and Abraxane status post 13 weeks of treatment and the patient is tolerating it fairly well. He has no evidence for disease progression on his recent scan.  PLAN: I discussed the scan results with the patient and his wife. I recommended for him to continue on the same treatment regimen for now. He would come back for followup visit in 2 weeks for evaluation and management any adverse effect of his treatment. He was advised to call me immediately if he has any concerning symptoms in the interval.  All questions were answered. The patient knows to call the clinic with any problems, questions or concerns. We can certainly see the patient much sooner if necessary.  I spent 15 minutes  counseling the patient face to face. The total time spent in the appointment was 25 minutes.

## 2012-08-23 NOTE — Patient Instructions (Signed)
You have no evidence for disease progression on his recent scan. Continue treatment with gemcitabine and Abraxane as scheduled. Followup in 2 weeks

## 2012-08-24 ENCOUNTER — Telehealth: Payer: Self-pay | Admitting: Internal Medicine

## 2012-08-24 NOTE — Telephone Encounter (Signed)
s/w pts wife and she is aware that we added in a visit and will get a new sch on 08/30/12    anne

## 2012-08-30 ENCOUNTER — Ambulatory Visit (HOSPITAL_BASED_OUTPATIENT_CLINIC_OR_DEPARTMENT_OTHER): Payer: Medicare Other

## 2012-08-30 ENCOUNTER — Other Ambulatory Visit (HOSPITAL_BASED_OUTPATIENT_CLINIC_OR_DEPARTMENT_OTHER): Payer: Medicare Other | Admitting: Lab

## 2012-08-30 ENCOUNTER — Other Ambulatory Visit: Payer: Self-pay | Admitting: Medical Oncology

## 2012-08-30 VITALS — BP 122/60 | HR 76 | Temp 98.1°F | Resp 18

## 2012-08-30 DIAGNOSIS — C259 Malignant neoplasm of pancreas, unspecified: Secondary | ICD-10-CM

## 2012-08-30 DIAGNOSIS — Z5111 Encounter for antineoplastic chemotherapy: Secondary | ICD-10-CM

## 2012-08-30 DIAGNOSIS — C252 Malignant neoplasm of tail of pancreas: Secondary | ICD-10-CM

## 2012-08-30 DIAGNOSIS — C787 Secondary malignant neoplasm of liver and intrahepatic bile duct: Secondary | ICD-10-CM

## 2012-08-30 DIAGNOSIS — C786 Secondary malignant neoplasm of retroperitoneum and peritoneum: Secondary | ICD-10-CM

## 2012-08-30 LAB — COMPREHENSIVE METABOLIC PANEL (CC13)
ALT: 37 U/L (ref 0–55)
AST: 35 U/L — ABNORMAL HIGH (ref 5–34)
Albumin: 3.1 g/dL — ABNORMAL LOW (ref 3.5–5.0)
Alkaline Phosphatase: 193 U/L — ABNORMAL HIGH (ref 40–150)
Chloride: 103 mEq/L (ref 98–107)
Potassium: 4.7 mEq/L (ref 3.5–5.1)
Sodium: 136 mEq/L (ref 136–145)
Total Protein: 6.7 g/dL (ref 6.4–8.3)

## 2012-08-30 LAB — CBC WITH DIFFERENTIAL/PLATELET
BASO%: 0.2 % (ref 0.0–2.0)
EOS%: 2.2 % (ref 0.0–7.0)
HGB: 8.6 g/dL — ABNORMAL LOW (ref 13.0–17.1)
MCH: 29.8 pg (ref 27.2–33.4)
MCHC: 32.2 g/dL (ref 32.0–36.0)
MONO#: 0.8 10*3/uL (ref 0.1–0.9)
RDW: 20.8 % — ABNORMAL HIGH (ref 11.0–14.6)
WBC: 4.6 10*3/uL (ref 4.0–10.3)
lymph#: 0.8 10*3/uL — ABNORMAL LOW (ref 0.9–3.3)

## 2012-08-30 MED ORDER — MIRTAZAPINE 30 MG PO TABS: 30.0000 mg | ORAL_TABLET | Freq: Every day | ORAL | Status: DC

## 2012-08-30 MED ORDER — PACLITAXEL PROTEIN-BOUND CHEMO INJECTION 100 MG
80.0000 mg/m2 | Freq: Once | INTRAVENOUS | Status: AC
  Administered 2012-08-30: 150 mg via INTRAVENOUS
  Filled 2012-08-30: qty 30

## 2012-08-30 MED ORDER — PROCHLORPERAZINE EDISYLATE 5 MG/ML IJ SOLN
10.0000 mg | Freq: Once | INTRAMUSCULAR | Status: AC
  Administered 2012-08-30: 10 mg via INTRAVENOUS

## 2012-08-30 MED ORDER — HEPARIN SOD (PORK) LOCK FLUSH 100 UNIT/ML IV SOLN
500.0000 [IU] | Freq: Once | INTRAVENOUS | Status: AC | PRN
  Administered 2012-08-30: 500 [IU]
  Filled 2012-08-30: qty 5

## 2012-08-30 MED ORDER — SODIUM CHLORIDE 0.9 % IJ SOLN
10.0000 mL | INTRAMUSCULAR | Status: DC | PRN
  Administered 2012-08-30: 10 mL
  Filled 2012-08-30: qty 10

## 2012-08-30 MED ORDER — SODIUM CHLORIDE 0.9 % IV SOLN
800.0000 mg/m2 | Freq: Once | INTRAVENOUS | Status: AC
  Administered 2012-08-30: 1444 mg via INTRAVENOUS
  Filled 2012-08-30: qty 38

## 2012-08-30 MED ORDER — SODIUM CHLORIDE 0.9 % IV SOLN
Freq: Once | INTRAVENOUS | Status: AC
  Administered 2012-08-30: 10:00:00 via INTRAVENOUS

## 2012-08-30 NOTE — Patient Instructions (Addendum)
[facility] Discharge Instructions for Patients Receiving Chemotherapy  Today you received the following chemotherapy agents :  Gemzar,  Abraxane.  To help prevent nausea and vomiting after your treatment, we encourage you to take your nausea medication as instructed by your physician.    If you develop nausea and vomiting that is not controlled by your nausea medication, call the clinic. If it is after clinic hours your family physician or the after hours number for the clinic or go to the Emergency Department.   BELOW ARE SYMPTOMS THAT SHOULD BE REPORTED IMMEDIATELY:  *FEVER GREATER THAN 100.5 F  *CHILLS WITH OR WITHOUT FEVER  NAUSEA AND VOMITING THAT IS NOT CONTROLLED WITH YOUR NAUSEA MEDICATION  *UNUSUAL SHORTNESS OF BREATH  *UNUSUAL BRUISING OR BLEEDING  TENDERNESS IN MOUTH AND THROAT WITH OR WITHOUT PRESENCE OF ULCERS  *URINARY PROBLEMS  *BOWEL PROBLEMS  UNUSUAL RASH Items with * indicate a potential emergency and should be followed up as soon as possible.  One of the nurses will contact you 24 hours after your treatment. Please let the nurse know about any problems that you may have experienced. Feel free to call the clinic you have any questions or concerns. The clinic phone number is [phone].   I have been informed and understand all the instructions given to me. I know to contact the clinic, my physician, or go to the Emergency Department if any problems should occur. I do not have any questions at this time, but understand that I may call the clinic during office hours   should I have any questions or need assistance in obtaining follow up care.    __________________________________________  _____________  __________ Signature of Patient or Authorized Representative            Date                   Time    __________________________________________ Nurse's Signature

## 2012-09-06 ENCOUNTER — Other Ambulatory Visit: Payer: BC Managed Care – PPO | Admitting: Lab

## 2012-09-06 ENCOUNTER — Telehealth: Payer: Self-pay | Admitting: *Deleted

## 2012-09-06 ENCOUNTER — Other Ambulatory Visit: Payer: Medicare Other | Admitting: Lab

## 2012-09-06 ENCOUNTER — Telehealth: Payer: Self-pay | Admitting: Internal Medicine

## 2012-09-06 ENCOUNTER — Encounter: Payer: Self-pay | Admitting: Physician Assistant

## 2012-09-06 ENCOUNTER — Ambulatory Visit (HOSPITAL_BASED_OUTPATIENT_CLINIC_OR_DEPARTMENT_OTHER): Payer: Medicare Other | Admitting: Physician Assistant

## 2012-09-06 ENCOUNTER — Ambulatory Visit (HOSPITAL_BASED_OUTPATIENT_CLINIC_OR_DEPARTMENT_OTHER): Payer: Medicare Other

## 2012-09-06 VITALS — BP 115/68 | HR 90 | Temp 97.4°F | Resp 20 | Ht 67.0 in | Wt 162.4 lb

## 2012-09-06 DIAGNOSIS — C779 Secondary and unspecified malignant neoplasm of lymph node, unspecified: Secondary | ICD-10-CM

## 2012-09-06 DIAGNOSIS — C252 Malignant neoplasm of tail of pancreas: Secondary | ICD-10-CM

## 2012-09-06 DIAGNOSIS — C259 Malignant neoplasm of pancreas, unspecified: Secondary | ICD-10-CM

## 2012-09-06 DIAGNOSIS — C787 Secondary malignant neoplasm of liver and intrahepatic bile duct: Secondary | ICD-10-CM

## 2012-09-06 DIAGNOSIS — Z5111 Encounter for antineoplastic chemotherapy: Secondary | ICD-10-CM

## 2012-09-06 LAB — CBC WITH DIFFERENTIAL/PLATELET
Basophils Absolute: 0 10*3/uL (ref 0.0–0.1)
LYMPH%: 15 % (ref 14.0–49.0)
MCH: 29.9 pg (ref 27.2–33.4)
MCHC: 32.7 g/dL (ref 32.0–36.0)
MCV: 91.4 fL (ref 79.3–98.0)
RBC: 3.04 10*6/uL — ABNORMAL LOW (ref 4.20–5.82)
RDW: 20.7 % — ABNORMAL HIGH (ref 11.0–14.6)

## 2012-09-06 LAB — COMPREHENSIVE METABOLIC PANEL (CC13)
ALT: 32 U/L (ref 0–55)
Albumin: 2.8 g/dL — ABNORMAL LOW (ref 3.5–5.0)
BUN: 28 mg/dL — ABNORMAL HIGH (ref 7.0–26.0)
CO2: 27 mEq/L (ref 22–29)
Calcium: 8.9 mg/dL (ref 8.4–10.4)
Chloride: 102 mEq/L (ref 98–107)
Creatinine: 0.9 mg/dL (ref 0.7–1.3)
Potassium: 4.7 mEq/L (ref 3.5–5.1)

## 2012-09-06 MED ORDER — SODIUM CHLORIDE 0.9 % IV SOLN
800.0000 mg/m2 | Freq: Once | INTRAVENOUS | Status: AC
  Administered 2012-09-06: 1444 mg via INTRAVENOUS
  Filled 2012-09-06: qty 38

## 2012-09-06 MED ORDER — HEPARIN SOD (PORK) LOCK FLUSH 100 UNIT/ML IV SOLN
500.0000 [IU] | Freq: Once | INTRAVENOUS | Status: AC | PRN
  Administered 2012-09-06: 500 [IU]
  Filled 2012-09-06: qty 5

## 2012-09-06 MED ORDER — PACLITAXEL PROTEIN-BOUND CHEMO INJECTION 100 MG
80.0000 mg/m2 | Freq: Once | INTRAVENOUS | Status: AC
  Administered 2012-09-06: 150 mg via INTRAVENOUS
  Filled 2012-09-06: qty 30

## 2012-09-06 MED ORDER — SODIUM CHLORIDE 0.9 % IJ SOLN
10.0000 mL | INTRAMUSCULAR | Status: DC | PRN
  Administered 2012-09-06: 10 mL
  Filled 2012-09-06: qty 10

## 2012-09-06 MED ORDER — SODIUM CHLORIDE 0.9 % IV SOLN
Freq: Once | INTRAVENOUS | Status: AC
  Administered 2012-09-06: 12:00:00 via INTRAVENOUS

## 2012-09-06 MED ORDER — PROCHLORPERAZINE EDISYLATE 5 MG/ML IJ SOLN
10.0000 mg | Freq: Once | INTRAMUSCULAR | Status: AC
  Administered 2012-09-06: 10 mg via INTRAVENOUS

## 2012-09-06 NOTE — Patient Instructions (Addendum)
Follow up in 2 weeks prior to your next cycle of chemotherapy 

## 2012-09-06 NOTE — Telephone Encounter (Signed)
gv and printed appt schedule for pt for jan and Feb...the patient ok

## 2012-09-06 NOTE — Telephone Encounter (Signed)
Per staff message and POF I have scheduled appts.  [name]  

## 2012-09-07 ENCOUNTER — Telehealth: Payer: Self-pay | Admitting: *Deleted

## 2012-09-07 NOTE — Telephone Encounter (Signed)
Per pt request, lab orders faxed to Dr [facility] office at [facility] office at [phone].  Labs are to be done 09/13/12.  SLJ

## 2012-09-08 NOTE — Progress Notes (Signed)
[facility] Telephone:([phone]) [phone]   Fax:([phone]) [phone]  OFFICE PROGRESS NOTE  [name], MD Dr. Sharman Crate [place]rick [address] [place] [address]  DIAGNOSIS: Metastatic pancreatic adenocarcinoma, now with metastatic disease to the liver and the abdominal wall, initially diagnosed with stage IIIB(T3, N1, M0) pancreatic tail adenocarcinoma in November of 2011.   PRIOR THERAPY:  #1 distal pancreatectomy and splenectomy on 06/25/2010  #2 adjuvant chemotherapy with gemcitabine started 08/20/2010 and completed 2 cycles on 12/02/2010.  #3 status post concurrent chemoradiation with Xeloda from 12/15/2010 on 10 01/24/2011.  #4 status post 2 more cycles of adjuvant gemcitabine with gemcitabine completed on 04/04/2011.  #5 FOLFOX giving every 2 weeks status post 12 cycles.  #6 Continuous infusion 5-FU and leucovorin every 2 weeks. Status post 4 cycles discontinued today secondary to disease progression.   CURRENT THERAPY: The patient was started on 05/03/2012 the first cycle of systemic chemotherapy with gemcitabine 800 mg/M2 in addition to Abraxane 80 mg/M2 on weekly basis for 3 out of every 4 weeks. Status post 15 cycles.   INTERVAL HISTORY: Philip Shaw 78 y.o. male returns to the clinic today for followup visit accompanied by his wife. The patient is feeling fine today with no specific complaints. He is tolerating his current treatment with gemcitabine and Abraxane fairly well with no significant adverse effect. He continues to have mild peripheral neuropathy in the fingers and toes but not worse than before. He denied having any significant weight loss or night sweats. He denied having any chest pain, shortness breath, cough or hemoptysis. He does report some sinus congestion for the past week, not associated with any fever or discolored secretions.  MEDICAL HISTORY: Past Medical History  Diagnosis Date  . Heart murmur   . Abdominal pain, periumbilical    . Constipation   . Diabetes mellitus   . Hypertension   . Sleep apnea     pt states he no longer uses his cpap - lost weight  . pancreatic ca dx'd 04/2010    METASTATIC PANCREATIC CA WITH METS TO LIVER AND ABDOMINAL WALL  -chemo every 2 weeks - ONCOLOGIST DR. MOHAMED MOHAMED WITH [facility].  Marland Kitchen Port-a-cath in place     RIGHT UPPER CHEST - FOR CHEMO EVERY 2 WEEKS    ALLERGIES:   has no known allergies.  MEDICATIONS:  Current Outpatient Prescriptions  Medication Sig Dispense Refill  . Alum & Mag Hydroxide-Simeth (MAGIC MOUTHWASH) SOLN Take 5 mLs by mouth QID.  240 mL  0  . aspirin 81 MG tablet Take 81 mg by mouth every morning.       . gabapentin (NEURONTIN) 100 MG capsule Take 1 capsule (100 mg total) by mouth 3 (three) times daily.  90 capsule  1  . insulin glargine (LANTUS) 100 UNIT/ML injection Inject into the skin at bedtime. 32 units every night except week of chemo pt would increase to 37      . lidocaine-prilocaine (EMLA) cream Apply 1 application topically as needed.  30 g  2  . magnesium hydroxide (MILK OF MAGNESIA) 400 MG/5ML suspension Take by mouth daily.       . methocarbamol (ROBAXIN) 500 MG tablet       . mirtazapine (REMERON) 30 MG tablet Take 1 tablet (30 mg total) by mouth at bedtime.  90 tablet  0  . ondansetron (ZOFRAN) 8 MG tablet Take 1 tablet (8 mg total) by mouth every 12 (twelve) hours as  needed for nausea.  60 tablet  2  . oxyCODONE (OXY IR/ROXICODONE) 5 MG immediate release tablet Take 1 tablet (5 mg total) by mouth every 4 (four) hours as needed.  30 tablet  0  . oxyCODONE (OXYCONTIN) 10 MG 12 hr tablet Take 1 tablet (10 mg total) by mouth every 12 (twelve) hours.  60 tablet  0  . pravastatin (PRAVACHOL) 20 MG tablet Take 20 mg by mouth at bedtime.       . ranitidine (ZANTAC) 150 MG tablet Take 150 mg by mouth 2 (two) times daily.       . Tamsulosin HCl (FLOMAX) 0.4 MG CAPS Once or twice daily      . terazosin (HYTRIN) 2 MG capsule Take 2 mg by  mouth 2 (two) times daily.       . [DISCONTINUED] metFORMIN (GLUCOPHAGE) 850 MG tablet Take 850 mg by mouth 2 (two) times daily with a meal.        No current facility-administered medications for this visit.   Facility-Administered Medications Ordered in Other Visits  Medication Dose Route Frequency Provider Last Rate Last Dose  . sodium chloride 0.9 % injection 10 mL  10 mL Intracatheter PRN Si Gaul, MD   10 mL at 07/26/12 1715    SURGICAL HISTORY:  Past Surgical History  Procedure Date  . Appendectomy 76-66 years old  . Pancreatectomy 06/2010    with splenectomy  . Inguinal hernia repair as infant, age 78    LIH- infant, RIH age 27  . Knee arthroscopy 04/23/2012    Procedure: ARTHROSCOPY KNEE;  Surgeon: Loanne Drilling, MD;  Location: [facility];  Service: Orthopedics;  Laterality: Right;  with Debridement    REVIEW OF SYSTEMS:  Pertinent items are noted in HPI.   PHYSICAL EXAMINATION: General appearance: alert, cooperative and no distress Head: Normocephalic, without obvious abnormality, atraumatic Neck: no adenopathy Lymph nodes: Cervical, supraclavicular, and axillary nodes normal. Resp: clear to auscultation bilaterally Cardio: regular rate and rhythm, S1, S2 normal, no murmur, click, rub or gallop GI: soft, non-tender; bowel sounds normal; no masses,  no organomegaly Extremities: extremities normal, atraumatic, no cyanosis or edema Neurologic: Alert and oriented X 3, normal strength and tone. Normal symmetric reflexes. Normal coordination and gait  ECOG PERFORMANCE STATUS: 1 - Symptomatic but completely ambulatory  Blood pressure 115/68, pulse 90, temperature 97.4 F (36.3 C), temperature source Oral, resp. rate 20, height 5\' 7"  (1.702 m), weight 162 lb 6.4 oz (73.664 kg).  LABORATORY DATA: Lab Results  Component Value Date   WBC 6.9 09/06/2012   HGB 9.1* 09/06/2012   HCT 27.8* 09/06/2012   MCV 91.4 09/06/2012   PLT 176 09/06/2012      Chemistry      Component  Value Date/Time   NA 132* 09/06/2012 1041   NA 136 04/19/2012 0930   K 4.7 09/06/2012 1041   K 4.3 04/19/2012 0930   CL 102 09/06/2012 1041   CL 100 04/19/2012 0930   CO2 27 09/06/2012 1041   CO2 28 04/19/2012 0930   BUN 28.0* 09/06/2012 1041   BUN 18 04/19/2012 0930   CREATININE 0.9 09/06/2012 1041   CREATININE 0.76 04/19/2012 0930      Component Value Date/Time   CALCIUM 8.9 09/06/2012 1041   CALCIUM 9.2 04/19/2012 0930   ALKPHOS 263* 09/06/2012 1041   ALKPHOS 249* 04/19/2012 0930   AST 33 09/06/2012 1041   AST 45* 04/19/2012 0930   ALT 32 09/06/2012 1041   ALT  42 04/19/2012 0930   BILITOT 0.41 09/06/2012 1041   BILITOT 0.6 04/19/2012 0930       RADIOGRAPHIC STUDIES: CT CHEST, ABDOMEN AND PELVIS WITH CONTRAST  Technique: Multidetector CT imaging of the chest, abdomen and  pelvis was performed following the standard protocol during bolus  administration of intravenous contrast.  Contrast: OMNIPAQUE IOHEXOL 300 MG/ML SOLN  Comparison: CT of the chest and abdomen 06/24/2012.  CT CHEST  Findings:  Mediastinum: Heart size is normal. There is atherosclerosis of the  thoracic aorta, the great vessels of the mediastinum and the  coronary arteries, including calcified atherosclerotic plaque in  the the left anterior descending, circumflex and right coronary  arteries. There is no significant pericardial fluid, thickening or  pericardial calcification. No pathologically enlarged mediastinal  or hilar lymph nodes. Esophagus is unremarkable in appearance.  Right internal jugular single lumen Port-A-Cath with tip  terminating in the distal superior vena cava  Lungs/Pleura: There are a few scattered tiny calcified granulomas.  No larger more suspicious appearing pulmonary nodules or masses are  identified. No acute consolidative airspace disease. No pleural  effusions.  Musculoskeletal: There are no aggressive appearing lytic or blastic  lesions noted in the visualized portions of the  skeleton.  IMPRESSION:  1. No findings to suggest metastatic disease to the thorax at this  time.  2. Atherosclerosis, including three-vessel coronary artery disease.  CT ABDOMEN AND PELVIS  Findings:  Abdomen/Pelvis: The lesion in segment 2 of the liver is slightly  smaller than the prior examination measuring 1.7 x 1.1 cm (image 52  of series 5) and demonstrates a general low attenuation with some  central enhancement. No new hepatic lesions are otherwise noted.  Large amount of pneumobilia in the intrahepatic biliary tree, and  small amount within the gallbladder. The pancreas is remarkable  for multifocal subcentimeter low attenuation lesions in the head,  with extensive pancreatic calcifications in the head, which are  unchanged. Postoperative changes of distal pancreatectomy and  splenectomy are noted. The appearance of the adrenal glands  bilaterally is unremarkable. Multiple renal lesions are noted  bilaterally, the majority of which are low attenuation and do not  enhance, compatible with simple cysts. Several lesions are too  small to definitively characterize. There is an irregular shaped  lesion in the lower pole of the right kidney which is similar in  size and appearance, likely representing a septated cyst. Multiple  nonobstructive calculi are noted within the collecting systems of  the kidneys bilaterally, largest of which measures 5 mm in the  lower pole collecting system of the right kidney.  A similar appearing stranding and mild nodularity is noted in the  omental fat adjacent to the splenic flexure of the colon. How much  of this represents postoperative scarring versus treated peritoneal  metastases is uncertain. No definite new peritoneal mass or  nodularity is otherwise appreciated. No significant volume of  ascites. No pneumoperitoneum. No pathologic distension of small  bowel. There are a few colonic diverticula, without surrounding  inflammatory changes to  suggest acute diverticulitis at this time.  Prostate gland is mildly heterogeneous. Urinary bladder is  unremarkable in appearance.  Musculoskeletal: There are no aggressive appearing lytic or blastic  lesions noted in the visualized portions of the skeleton.  IMPRESSION:  1. Status post distal pancreatectomy without definitive evidence  to suggest local recurrence of disease. Lesion in segment 2 of the  liver appears slightly smaller than the prior examination,  measuring 1.7  x 1.1 cm on today's study, and no new hepatic lesions  are noted. A small amount of omental soft tissue stranding and  nodularity predominately adjacent to the splenic flexure is similar  to the prior examination; how much of this represents omental  metastases versus postoperative scarring is uncertain. No new  sites of metastatic disease are identified on today's examination.  2. Additional incidental findings, as above, similar to prior  studies.  Original Report Authenticated By: Trudie Reed, M.D.    ASSESSMENT/PLAN: This is a very pleasant 78 years old white male with history of metastatic pancreatic adenocarcinoma currently on treatment with gemcitabine and Abraxane status post 13 weeks of treatment and the patient is tolerating it fairly well. He has no evidence for disease progression on his recent scan. The patient was discussed with Dr. Arbutus Ped. He will continue on his current chemotherapy. He will return in 2 weeks at the start of his next cycle of systemic chemotherapy with gemcitabine at 80 mg/M2 in addition to Abraxane 80 mg/M2 given weekly for 3 out of 4 weeks.  Philip Shaw, Philip Aughenbaugh E, PA-C   All questions were answered. The patient knows to call the clinic with any problems, questions or concerns. We can certainly see the patient much sooner if necessary.  I spent 20 minutes counseling the patient face to face. The total time spent in the appointment was 30 minutes.

## 2012-09-10 ENCOUNTER — Telehealth: Payer: Self-pay | Admitting: Internal Medicine

## 2012-09-10 NOTE — Telephone Encounter (Signed)
pt called to cx the 1/20 lab and he states that he gets them drawn closer to home/pcp,lab cx and lm for diane to ck on orders      anne

## 2012-09-13 ENCOUNTER — Other Ambulatory Visit: Payer: BC Managed Care – PPO | Admitting: Lab

## 2012-09-13 ENCOUNTER — Ambulatory Visit: Payer: BC Managed Care – PPO

## 2012-09-16 ENCOUNTER — Other Ambulatory Visit: Payer: Self-pay | Admitting: Physician Assistant

## 2012-09-20 ENCOUNTER — Other Ambulatory Visit (HOSPITAL_BASED_OUTPATIENT_CLINIC_OR_DEPARTMENT_OTHER): Payer: Medicare Other | Admitting: Lab

## 2012-09-20 ENCOUNTER — Telehealth: Payer: Self-pay | Admitting: *Deleted

## 2012-09-20 ENCOUNTER — Ambulatory Visit (HOSPITAL_BASED_OUTPATIENT_CLINIC_OR_DEPARTMENT_OTHER): Payer: Medicare Other | Admitting: Physician Assistant

## 2012-09-20 ENCOUNTER — Telehealth: Payer: Self-pay | Admitting: Internal Medicine

## 2012-09-20 ENCOUNTER — Ambulatory Visit (HOSPITAL_BASED_OUTPATIENT_CLINIC_OR_DEPARTMENT_OTHER): Payer: Medicare Other

## 2012-09-20 ENCOUNTER — Encounter: Payer: Self-pay | Admitting: Physician Assistant

## 2012-09-20 VITALS — BP 125/64 | HR 87 | Temp 98.0°F | Resp 18 | Ht 67.0 in | Wt 162.0 lb

## 2012-09-20 DIAGNOSIS — C259 Malignant neoplasm of pancreas, unspecified: Secondary | ICD-10-CM

## 2012-09-20 DIAGNOSIS — Z5111 Encounter for antineoplastic chemotherapy: Secondary | ICD-10-CM

## 2012-09-20 DIAGNOSIS — C252 Malignant neoplasm of tail of pancreas: Secondary | ICD-10-CM

## 2012-09-20 DIAGNOSIS — C50919 Malignant neoplasm of unspecified site of unspecified female breast: Secondary | ICD-10-CM

## 2012-09-20 DIAGNOSIS — C779 Secondary and unspecified malignant neoplasm of lymph node, unspecified: Secondary | ICD-10-CM

## 2012-09-20 DIAGNOSIS — C787 Secondary malignant neoplasm of liver and intrahepatic bile duct: Secondary | ICD-10-CM

## 2012-09-20 LAB — COMPREHENSIVE METABOLIC PANEL (CC13)
ALT: 29 U/L (ref 0–55)
AST: 24 U/L (ref 5–34)
Albumin: 3 g/dL — ABNORMAL LOW (ref 3.5–5.0)
Alkaline Phosphatase: 216 U/L — ABNORMAL HIGH (ref 40–150)
BUN: 25.5 mg/dL (ref 7.0–26.0)
Calcium: 8.9 mg/dL (ref 8.4–10.4)
Chloride: 102 mEq/L (ref 98–107)
Potassium: 4.6 mEq/L (ref 3.5–5.1)
Sodium: 136 mEq/L (ref 136–145)
Total Protein: 6.7 g/dL (ref 6.4–8.3)

## 2012-09-20 LAB — CBC WITH DIFFERENTIAL/PLATELET
BASO%: 0.6 % (ref 0.0–2.0)
EOS%: 6.1 % (ref 0.0–7.0)
HCT: 29.1 % — ABNORMAL LOW (ref 38.4–49.9)
HGB: 9.5 g/dL — ABNORMAL LOW (ref 13.0–17.1)
MCH: 29.8 pg (ref 27.2–33.4)
MCHC: 32.6 g/dL (ref 32.0–36.0)
MONO#: 1.2 10*3/uL — ABNORMAL HIGH (ref 0.1–0.9)
NEUT%: 48.8 % (ref 39.0–75.0)
RDW: 21.5 % — ABNORMAL HIGH (ref 11.0–14.6)
WBC: 5.3 10*3/uL (ref 4.0–10.3)
lymph#: 1.2 10*3/uL (ref 0.9–3.3)

## 2012-09-20 MED ORDER — PROCHLORPERAZINE EDISYLATE 5 MG/ML IJ SOLN
10.0000 mg | Freq: Once | INTRAMUSCULAR | Status: AC
  Administered 2012-09-20: 10 mg via INTRAVENOUS

## 2012-09-20 MED ORDER — SODIUM CHLORIDE 0.9 % IV SOLN
800.0000 mg/m2 | Freq: Once | INTRAVENOUS | Status: AC
  Administered 2012-09-20: 1444 mg via INTRAVENOUS
  Filled 2012-09-20: qty 38

## 2012-09-20 MED ORDER — PACLITAXEL PROTEIN-BOUND CHEMO INJECTION 100 MG
80.0000 mg/m2 | Freq: Once | INTRAVENOUS | Status: AC
  Administered 2012-09-20: 150 mg via INTRAVENOUS
  Filled 2012-09-20: qty 30

## 2012-09-20 MED ORDER — HEPARIN SOD (PORK) LOCK FLUSH 100 UNIT/ML IV SOLN
500.0000 [IU] | Freq: Once | INTRAVENOUS | Status: AC | PRN
  Administered 2012-09-20: 500 [IU]
  Filled 2012-09-20: qty 5

## 2012-09-20 MED ORDER — SODIUM CHLORIDE 0.9 % IJ SOLN
10.0000 mL | INTRAMUSCULAR | Status: AC | PRN
  Administered 2012-09-20: 10 mL
  Filled 2012-09-20: qty 10

## 2012-09-20 MED ORDER — SODIUM CHLORIDE 0.9 % IV SOLN
Freq: Once | INTRAVENOUS | Status: AC
  Administered 2012-09-20: 16:00:00 via INTRAVENOUS

## 2012-09-20 NOTE — Telephone Encounter (Signed)
Scheduled lab and ML emailed Philip Shaw Philip Shaw regarding chemo pt will get appt calendar @ chemo room.

## 2012-09-20 NOTE — Telephone Encounter (Signed)
Per staff message I have adjusted 2/24 appt.  [name]

## 2012-09-20 NOTE — Patient Instructions (Addendum)
Continue with your chemotherapy with gemcitabine and Abraxane as scheduled. Followup in one month prior to your next scheduled cycle of chemotherapy.

## 2012-09-20 NOTE — Progress Notes (Signed)
[facility] Telephone:([phone]   [phone]  OFFICE PROGRESS NOTE  Philip Ravel, MD Dr. Sharman Crate Shaw [address] [place] [address]  DIAGNOSIS: Metastatic pancreatic adenocarcinoma, now with metastatic disease to the liver and the abdominal wall, initially diagnosed with stage IIIB(T3, N1, M0) pancreatic tail adenocarcinoma in November of 2011.   PRIOR THERAPY:  #1 distal pancreatectomy and splenectomy on 06/25/2010  #2 adjuvant chemotherapy with gemcitabine started 08/20/2010 and completed 2 cycles on 12/02/2010.  #3 status post concurrent chemoradiation with Xeloda from 12/15/2010 on 10 01/24/2011.  #4 status post 2 more cycles of adjuvant gemcitabine with gemcitabine completed on 04/04/2011.  #5 FOLFOX giving every 2 weeks status post 12 cycles.  #6 Continuous infusion 5-FU and leucovorin every 2 weeks. Status post 4 cycles discontinued today secondary to disease progression.   CURRENT THERAPY: The patient was started on 05/03/2012 the first cycle of systemic chemotherapy with gemcitabine 800 mg/M2 in addition to Abraxane 80 mg/M2 on weekly basis for 3 out of every 4 weeks. Status post 16 cycles.   INTERVAL HISTORY: Philip Shaw 78 y.o. male returns to the clinic today for followup visit accompanied by his wife. The patient is feeling fine today with no specific complaints. He is tolerating his current treatment with gemcitabine and Abraxane fairly well with no significant adverse effect. He continues to have mild peripheral neuropathy in the fingers and toes but not worse than before. He does state that last week he just "didn't feel good". He had some discomfort in his mid chest that radiated through to the back but was relieved with rest. Symptoms were not associated with any cough, shortness of breath, fever or chills. Symptoms have completely resolved. He denied having any significant weight loss or night sweats. He denied having  any chest pain, shortness breath, cough or hemoptysis. He does continue to report some sinus congestion, not associated with any fever or discolored secretions.  MEDICAL HISTORY: Past Medical History  Diagnosis Date  . Heart murmur   . Abdominal pain, periumbilical   . Constipation   . Diabetes mellitus   . Hypertension   . Sleep apnea     pt states he no longer uses his cpap - lost weight  . pancreatic ca dx'd 04/2010    METASTATIC PANCREATIC CA WITH METS TO LIVER AND ABDOMINAL WALL  -chemo every 2 weeks - ONCOLOGIST DR. MOHAMED MOHAMED WITH [facility].  Marland Kitchen Port-a-cath in place     RIGHT UPPER CHEST - FOR CHEMO EVERY 2 WEEKS    ALLERGIES:   has no known allergies.  MEDICATIONS:  Current Outpatient Prescriptions  Medication Sig Dispense Refill  . Alum & Mag Hydroxide-Simeth (MAGIC MOUTHWASH) SOLN Take 5 mLs by mouth QID.  240 mL  0  . aspirin 81 MG tablet Take 81 mg by mouth every morning.       . gabapentin (NEURONTIN) 100 MG capsule Take 1 capsule (100 mg total) by mouth 3 (three) times daily.  90 capsule  1  . insulin glargine (LANTUS) 100 UNIT/ML injection Inject into the skin at bedtime. 32 units every night except week of chemo pt would increase to 37      . lidocaine-prilocaine (EMLA) cream Apply 1 application topically as needed.  30 g  2  . methocarbamol (ROBAXIN) 500 MG tablet       . mirtazapine (REMERON) 30 MG tablet Take 1 tablet (30 mg total) by mouth at  bedtime.  90 tablet  0  . ondansetron (ZOFRAN) 8 MG tablet Take 1 tablet (8 mg total) by mouth every 12 (twelve) hours as needed for nausea.  60 tablet  2  . oxyCODONE (OXY IR/ROXICODONE) 5 MG immediate release tablet Take 1 tablet (5 mg total) by mouth every 4 (four) hours as needed.  30 tablet  0  . oxyCODONE (OXYCONTIN) 10 MG 12 hr tablet Take 1 tablet (10 mg total) by mouth every 12 (twelve) hours.  60 tablet  0  . pravastatin (PRAVACHOL) 20 MG tablet Take 20 mg by mouth at bedtime.       . ranitidine  (ZANTAC) 150 MG tablet Take 150 mg by mouth 2 (two) times daily.       . Tamsulosin HCl (FLOMAX) 0.4 MG CAPS Once or twice daily      . terazosin (HYTRIN) 2 MG capsule Take 2 mg by mouth 2 (two) times daily.       . [DISCONTINUED] metFORMIN (GLUCOPHAGE) 850 MG tablet Take 850 mg by mouth 2 (two) times daily with a meal.        No current facility-administered medications for this visit.   Facility-Administered Medications Ordered in Other Visits  Medication Dose Route Frequency Provider Last Rate Last Dose  . heparin lock flush 100 unit/mL  500 Units Intracatheter Once PRN Conni Slipper, PA      . PACLitaxel-protein bound (ABRAXANE) chemo infusion 150 mg  80 mg/m2 (Treatment Plan Actual) Intravenous Once Conni Slipper, PA 60 mL/hr at 09/20/12 1646 150 mg at 09/20/12 1646  . sodium chloride 0.9 % injection 10 mL  10 mL Intracatheter PRN Si Gaul, MD   10 mL at 07/26/12 1715  . sodium chloride 0.9 % injection 10 mL  10 mL Intracatheter PRN Conni Slipper, PA        SURGICAL HISTORY:  Past Surgical History  Procedure Date  . Appendectomy 15-59 years old  . Pancreatectomy 06/2010    with splenectomy  . Inguinal hernia repair as infant, age 48    LIH- infant, RIH age 43  . Knee arthroscopy 04/23/2012    Procedure: ARTHROSCOPY KNEE;  Surgeon: Loanne Drilling, MD;  Location: [facility];  Service: Orthopedics;  Laterality: Right;  with Debridement    REVIEW OF SYSTEMS:  Pertinent items are noted in HPI.   PHYSICAL EXAMINATION: General appearance: alert, cooperative and no distress Head: Normocephalic, without obvious abnormality, atraumatic Neck: no adenopathy Lymph nodes: Cervical, supraclavicular, and axillary nodes normal. Resp: clear to auscultation bilaterally Cardio: regular rate and rhythm, S1, S2 normal, no murmur, click, rub or gallop GI: soft, non-tender; bowel sounds normal; no masses,  no organomegaly Extremities: extremities normal, atraumatic, no cyanosis or  edema Neurologic: Alert and oriented X 3, normal strength and tone. Normal symmetric reflexes. Normal coordination and gait  ECOG PERFORMANCE STATUS: 1 - Symptomatic but completely ambulatory  Blood pressure 125/64, pulse 87, temperature 98 F (36.7 C), temperature source Oral, resp. rate 18, height 5\' 7"  (1.702 m), weight 162 lb (73.483 kg).  LABORATORY DATA: Lab Results  Component Value Date   WBC 5.3 09/20/2012   HGB 9.5* 09/20/2012   HCT 29.1* 09/20/2012   MCV 91.2 09/20/2012   PLT 612* 09/20/2012      Chemistry      Component Value Date/Time   NA 132* 09/06/2012 1041   NA 136 04/19/2012 0930   K 4.7 09/06/2012 1041   K 4.3 04/19/2012 0930   CL  102 09/06/2012 1041   CL 100 04/19/2012 0930   CO2 27 09/06/2012 1041   CO2 28 04/19/2012 0930   BUN 28.0* 09/06/2012 1041   BUN 18 04/19/2012 0930   CREATININE 0.9 09/06/2012 1041   CREATININE 0.76 04/19/2012 0930      Component Value Date/Time   CALCIUM 8.9 09/06/2012 1041   CALCIUM 9.2 04/19/2012 0930   ALKPHOS 263* 09/06/2012 1041   ALKPHOS 249* 04/19/2012 0930   AST 33 09/06/2012 1041   AST 45* 04/19/2012 0930   ALT 32 09/06/2012 1041   ALT 42 04/19/2012 0930   BILITOT 0.41 09/06/2012 1041   BILITOT 0.6 04/19/2012 0930       RADIOGRAPHIC STUDIES: CT CHEST, ABDOMEN AND PELVIS WITH CONTRAST  Technique: Multidetector CT imaging of the chest, abdomen and  pelvis was performed following the standard protocol during bolus  administration of intravenous contrast.  Contrast: OMNIPAQUE IOHEXOL 300 MG/ML SOLN  Comparison: CT of the chest and abdomen 06/24/2012.  CT CHEST  Findings:  Mediastinum: Heart size is normal. There is atherosclerosis of the  thoracic aorta, the great vessels of the mediastinum and the  coronary arteries, including calcified atherosclerotic plaque in  the the left anterior descending, circumflex and right coronary  arteries. There is no significant pericardial fluid, thickening or  pericardial calcification. No  pathologically enlarged mediastinal  or hilar lymph nodes. Esophagus is unremarkable in appearance.  Right internal jugular single lumen Port-A-Cath with tip  terminating in the distal superior vena cava  Lungs/Pleura: There are a few scattered tiny calcified granulomas.  No larger more suspicious appearing pulmonary nodules or masses are  identified. No acute consolidative airspace disease. No pleural  effusions.  Musculoskeletal: There are no aggressive appearing lytic or blastic  lesions noted in the visualized portions of the skeleton.  IMPRESSION:  1. No findings to suggest metastatic disease to the thorax at this  time.  2. Atherosclerosis, including three-vessel coronary artery disease.  CT ABDOMEN AND PELVIS  Findings:  Abdomen/Pelvis: The lesion in segment 2 of the liver is slightly  smaller than the prior examination measuring 1.7 x 1.1 cm (image 52  of series 5) and demonstrates a general low attenuation with some  central enhancement. No new hepatic lesions are otherwise noted.  Large amount of pneumobilia in the intrahepatic biliary tree, and  small amount within the gallbladder. The pancreas is remarkable  for multifocal subcentimeter low attenuation lesions in the head,  with extensive pancreatic calcifications in the head, which are  unchanged. Postoperative changes of distal pancreatectomy and  splenectomy are noted. The appearance of the adrenal glands  bilaterally is unremarkable. Multiple renal lesions are noted  bilaterally, the majority of which are low attenuation and do not  enhance, compatible with simple cysts. Several lesions are too  small to definitively characterize. There is an irregular shaped  lesion in the lower pole of the right kidney which is similar in  size and appearance, likely representing a septated cyst. Multiple  nonobstructive calculi are noted within the collecting systems of  the kidneys bilaterally, largest of which measures 5 mm in  the  lower pole collecting system of the right kidney.  A similar appearing stranding and mild nodularity is noted in the  omental fat adjacent to the splenic flexure of the colon. How much  of this represents postoperative scarring versus treated peritoneal  metastases is uncertain. No definite new peritoneal mass or  nodularity is otherwise appreciated. No significant volume of  ascites. No pneumoperitoneum. No pathologic distension of small  bowel. There are a few colonic diverticula, without surrounding  inflammatory changes to suggest acute diverticulitis at this time.  Prostate gland is mildly heterogeneous. Urinary bladder is  unremarkable in appearance.  Musculoskeletal: There are no aggressive appearing lytic or blastic  lesions noted in the visualized portions of the skeleton.  IMPRESSION:  1. Status post distal pancreatectomy without definitive evidence  to suggest local recurrence of disease. Lesion in segment 2 of the  liver appears slightly smaller than the prior examination,  measuring 1.7 x 1.1 cm on today's study, and no new hepatic lesions  are noted. A small amount of omental soft tissue stranding and  nodularity predominately adjacent to the splenic flexure is similar  to the prior examination; how much of this represents omental  metastases versus postoperative scarring is uncertain. No new  sites of metastatic disease are identified on today's examination.  2. Additional incidental findings, as above, similar to prior  studies.  Original Report Authenticated By: Trudie Reed, M.D.    ASSESSMENT/PLAN: This is a very pleasant 78 years old white male with history of metastatic pancreatic adenocarcinoma currently on treatment with gemcitabine and Abraxane status post 16 cycles of treatment and the patient is tolerating it fairly well. He has no evidence for disease progression on his recent scan. The patient was discussed with Dr. Arbutus Ped. He will continue on his  current chemotherapy. He will return in 2 weeks at the start of his next cycle of systemic chemotherapy with gemcitabine at 80 mg/M2 in addition to Abraxane 80 mg/M2 given weekly for 3 out of 4 weeks.  Philip Shaw, Philip Walraven E, PA-C   All questions were answered. The patient knows to call the clinic with any problems, questions or concerns. We can certainly see the patient much sooner if necessary.  I spent 20 minutes counseling the patient face to face. The total time spent in the appointment was 30 minutes.

## 2012-09-20 NOTE — Telephone Encounter (Signed)
Called pt and left message regarding lab and chemo on February 2014 advised patient to get calendar

## 2012-09-21 ENCOUNTER — Other Ambulatory Visit: Payer: Self-pay | Admitting: Certified Registered Nurse Anesthetist

## 2012-09-27 ENCOUNTER — Other Ambulatory Visit: Payer: Self-pay | Admitting: Internal Medicine

## 2012-09-27 ENCOUNTER — Ambulatory Visit (HOSPITAL_BASED_OUTPATIENT_CLINIC_OR_DEPARTMENT_OTHER): Payer: Medicare Other

## 2012-09-27 ENCOUNTER — Other Ambulatory Visit (HOSPITAL_BASED_OUTPATIENT_CLINIC_OR_DEPARTMENT_OTHER): Payer: Medicare Other | Admitting: Lab

## 2012-09-27 VITALS — BP 110/62 | HR 89 | Temp 98.2°F | Resp 20

## 2012-09-27 DIAGNOSIS — C787 Secondary malignant neoplasm of liver and intrahepatic bile duct: Secondary | ICD-10-CM

## 2012-09-27 DIAGNOSIS — C252 Malignant neoplasm of tail of pancreas: Secondary | ICD-10-CM

## 2012-09-27 DIAGNOSIS — C259 Malignant neoplasm of pancreas, unspecified: Secondary | ICD-10-CM

## 2012-09-27 DIAGNOSIS — Z5111 Encounter for antineoplastic chemotherapy: Secondary | ICD-10-CM

## 2012-09-27 DIAGNOSIS — C50919 Malignant neoplasm of unspecified site of unspecified female breast: Secondary | ICD-10-CM

## 2012-09-27 LAB — COMPREHENSIVE METABOLIC PANEL (CC13)
ALT: 27 U/L (ref 0–55)
AST: 25 U/L (ref 5–34)
Calcium: 8.6 mg/dL (ref 8.4–10.4)
Chloride: 103 mEq/L (ref 98–107)
Creatinine: 0.8 mg/dL (ref 0.7–1.3)
Sodium: 137 mEq/L (ref 136–145)
Total Bilirubin: 0.37 mg/dL (ref 0.20–1.20)
Total Protein: 6.4 g/dL (ref 6.4–8.3)

## 2012-09-27 LAB — CBC WITH DIFFERENTIAL/PLATELET
BASO%: 0.7 % (ref 0.0–2.0)
LYMPH%: 21.5 % (ref 14.0–49.0)
MCHC: 32.1 g/dL (ref 32.0–36.0)
MONO#: 0.9 10*3/uL (ref 0.1–0.9)
NEUT#: 2.1 10*3/uL (ref 1.5–6.5)
Platelets: 444 10*3/uL — ABNORMAL HIGH (ref 140–400)
RBC: 3.04 10*6/uL — ABNORMAL LOW (ref 4.20–5.82)
RDW: 21.4 % — ABNORMAL HIGH (ref 11.0–14.6)
WBC: 4 10*3/uL (ref 4.0–10.3)
nRBC: 1 % — ABNORMAL HIGH (ref 0–0)

## 2012-09-27 MED ORDER — SODIUM CHLORIDE 0.9 % IV SOLN
800.0000 mg/m2 | Freq: Once | INTRAVENOUS | Status: AC
  Administered 2012-09-27: 1444 mg via INTRAVENOUS
  Filled 2012-09-27: qty 38

## 2012-09-27 MED ORDER — SODIUM CHLORIDE 0.9 % IV SOLN
Freq: Once | INTRAVENOUS | Status: AC
  Administered 2012-09-27: 12:00:00 via INTRAVENOUS

## 2012-09-27 MED ORDER — HEPARIN SOD (PORK) LOCK FLUSH 100 UNIT/ML IV SOLN
500.0000 [IU] | Freq: Once | INTRAVENOUS | Status: AC | PRN
  Administered 2012-09-27: 500 [IU]
  Filled 2012-09-27: qty 5

## 2012-09-27 MED ORDER — PACLITAXEL PROTEIN-BOUND CHEMO INJECTION 100 MG
80.0000 mg/m2 | Freq: Once | INTRAVENOUS | Status: AC
  Administered 2012-09-27: 150 mg via INTRAVENOUS
  Filled 2012-09-27: qty 30

## 2012-09-27 MED ORDER — SODIUM CHLORIDE 0.9 % IJ SOLN
10.0000 mL | INTRAMUSCULAR | Status: DC | PRN
  Administered 2012-09-27: 10 mL
  Filled 2012-09-27: qty 10

## 2012-09-27 MED ORDER — PROCHLORPERAZINE EDISYLATE 5 MG/ML IJ SOLN
10.0000 mg | Freq: Once | INTRAMUSCULAR | Status: AC
  Administered 2012-09-27: 10 mg via INTRAVENOUS

## 2012-09-27 NOTE — Patient Instructions (Addendum)
[facility] Discharge Instructions for Patients Receiving Chemotherapy  Today you received the following chemotherapy agents abraxane/ gemzar  To help prevent nausea and vomiting after your treatment, we encourage you to take your nausea medication  and take it as often as prescribed   If you develop nausea and vomiting that is not controlled by your nausea medication, call the clinic. If it is after clinic hours your family physician or the after hours number for the clinic or go to the Emergency Department.   BELOW ARE SYMPTOMS THAT SHOULD BE REPORTED IMMEDIATELY:  *FEVER GREATER THAN 100.5 F  *CHILLS WITH OR WITHOUT FEVER  NAUSEA AND VOMITING THAT IS NOT CONTROLLED WITH YOUR NAUSEA MEDICATION  *UNUSUAL SHORTNESS OF BREATH  *UNUSUAL BRUISING OR BLEEDING  TENDERNESS IN MOUTH AND THROAT WITH OR WITHOUT PRESENCE OF ULCERS  *URINARY PROBLEMS  *BOWEL PROBLEMS  UNUSUAL RASH Items with * indicate a potential emergency and should be followed up as soon as possible.  One of the nurses will contact you 24 hours after your treatment. Please let the nurse know about any problems that you may have experienced. Feel free to call the clinic you have any questions or concerns. The clinic phone number is [phone].   I have been informed and understand all the instructions given to me. I know to contact the clinic, my physician, or go to the Emergency Department if any problems should occur. I do not have any questions at this time, but understand that I may call the clinic during office hours   should I have any questions or need assistance in obtaining follow up care.    __________________________________________  _____________  __________ Signature of Patient or Authorized Representative            Date                   Time    __________________________________________ Nurse's Signature

## 2012-09-30 ENCOUNTER — Encounter: Payer: Self-pay | Admitting: Certified Registered Nurse Anesthetist

## 2012-09-30 NOTE — Progress Notes (Signed)
Note for tmt given on 09/20/2012, Gemzar started at 1613 hr and taxol started at 1646hr.  Gemzar was given over .Marland Kitchen HL

## 2012-10-04 ENCOUNTER — Other Ambulatory Visit (HOSPITAL_BASED_OUTPATIENT_CLINIC_OR_DEPARTMENT_OTHER): Payer: Medicare Other | Admitting: Lab

## 2012-10-04 ENCOUNTER — Ambulatory Visit (HOSPITAL_BASED_OUTPATIENT_CLINIC_OR_DEPARTMENT_OTHER): Payer: Medicare Other

## 2012-10-04 VITALS — BP 115/67 | HR 80 | Temp 97.6°F | Resp 20

## 2012-10-04 DIAGNOSIS — C50919 Malignant neoplasm of unspecified site of unspecified female breast: Secondary | ICD-10-CM

## 2012-10-04 DIAGNOSIS — C259 Malignant neoplasm of pancreas, unspecified: Secondary | ICD-10-CM

## 2012-10-04 DIAGNOSIS — C782 Secondary malignant neoplasm of pleura: Secondary | ICD-10-CM

## 2012-10-04 DIAGNOSIS — C252 Malignant neoplasm of tail of pancreas: Secondary | ICD-10-CM

## 2012-10-04 DIAGNOSIS — Z5111 Encounter for antineoplastic chemotherapy: Secondary | ICD-10-CM

## 2012-10-04 LAB — CBC WITH DIFFERENTIAL/PLATELET
Basophils Absolute: 0 10*3/uL (ref 0.0–0.1)
Eosinophils Absolute: 0.3 10*3/uL (ref 0.0–0.5)
HGB: 9.1 g/dL — ABNORMAL LOW (ref 13.0–17.1)
MCV: 90.7 fL (ref 79.3–98.0)
MONO#: 1.2 10*3/uL — ABNORMAL HIGH (ref 0.1–0.9)
MONO%: 20.8 % — ABNORMAL HIGH (ref 0.0–14.0)
NEUT#: 3.3 10*3/uL (ref 1.5–6.5)
RBC: 3.11 10*6/uL — ABNORMAL LOW (ref 4.20–5.82)
RDW: 21.4 % — ABNORMAL HIGH (ref 11.0–14.6)
WBC: 5.7 10*3/uL (ref 4.0–10.3)
nRBC: 1 % — ABNORMAL HIGH (ref 0–0)

## 2012-10-04 LAB — COMPREHENSIVE METABOLIC PANEL (CC13)
ALT: 31 U/L (ref 0–55)
Albumin: 2.8 g/dL — ABNORMAL LOW (ref 3.5–5.0)
CO2: 30 mEq/L — ABNORMAL HIGH (ref 22–29)
Calcium: 8.7 mg/dL (ref 8.4–10.4)
Chloride: 101 mEq/L (ref 98–107)
Glucose: 83 mg/dl (ref 70–99)
Potassium: 4.7 mEq/L (ref 3.5–5.1)
Sodium: 135 mEq/L — ABNORMAL LOW (ref 136–145)
Total Protein: 6.3 g/dL — ABNORMAL LOW (ref 6.4–8.3)

## 2012-10-04 MED ORDER — HEPARIN SOD (PORK) LOCK FLUSH 100 UNIT/ML IV SOLN
500.0000 [IU] | Freq: Once | INTRAVENOUS | Status: AC | PRN
  Administered 2012-10-04: 500 [IU]
  Filled 2012-10-04: qty 5

## 2012-10-04 MED ORDER — PACLITAXEL PROTEIN-BOUND CHEMO INJECTION 100 MG
80.0000 mg/m2 | Freq: Once | INTRAVENOUS | Status: AC
  Administered 2012-10-04: 150 mg via INTRAVENOUS
  Filled 2012-10-04: qty 30

## 2012-10-04 MED ORDER — GEMCITABINE HCL CHEMO INJECTION 1 GM/26.3ML
800.0000 mg/m2 | Freq: Once | INTRAVENOUS | Status: AC
  Administered 2012-10-04: 1444 mg via INTRAVENOUS
  Filled 2012-10-04: qty 38.02

## 2012-10-04 MED ORDER — SODIUM CHLORIDE 0.9 % IV SOLN
Freq: Once | INTRAVENOUS | Status: AC
  Administered 2012-10-04: 12:00:00 via INTRAVENOUS

## 2012-10-04 MED ORDER — PROCHLORPERAZINE EDISYLATE 5 MG/ML IJ SOLN
10.0000 mg | Freq: Once | INTRAMUSCULAR | Status: AC
  Administered 2012-10-04: 10 mg via INTRAVENOUS

## 2012-10-04 MED ORDER — SODIUM CHLORIDE 0.9 % IJ SOLN
10.0000 mL | INTRAMUSCULAR | Status: DC | PRN
  Administered 2012-10-04: 10 mL
  Filled 2012-10-04: qty 10

## 2012-10-04 NOTE — Patient Instructions (Addendum)
[facility] Discharge Instructions for Patients Receiving Chemotherapy  Today you received the following chemotherapy agents: Abraxane, Gemzar. To help prevent nausea and vomiting after your treatment, we encourage you to take your nausea medication, Zofran. Begin taking it tomorrow morning and take it as often as prescribed for the next 48 hours.   If you develop nausea and vomiting that is not controlled by your nausea medication, call the clinic. If it is after clinic hours your family physician or the after hours number for the clinic or go to the Emergency Department.   BELOW ARE SYMPTOMS THAT SHOULD BE REPORTED IMMEDIATELY:  *FEVER GREATER THAN 100.5 F  *CHILLS WITH OR WITHOUT FEVER  NAUSEA AND VOMITING THAT IS NOT CONTROLLED WITH YOUR NAUSEA MEDICATION  *UNUSUAL SHORTNESS OF BREATH  *UNUSUAL BRUISING OR BLEEDING  TENDERNESS IN MOUTH AND THROAT WITH OR WITHOUT PRESENCE OF ULCERS  *URINARY PROBLEMS  *BOWEL PROBLEMS  UNUSUAL RASH Items with * indicate a potential emergency and should be followed up as soon as possible.  Feel free to call the clinic you have any questions or concerns. The clinic phone number is [phone].   I have been informed and understand all the instructions given to me. I know to contact the clinic, my physician, or go to the Emergency Department if any problems should occur. I do not have any questions at this time, but understand that I may call the clinic during office hours   should I have any questions or need assistance in obtaining follow up care.

## 2012-10-11 ENCOUNTER — Other Ambulatory Visit: Payer: BC Managed Care – PPO | Admitting: Lab

## 2012-10-11 ENCOUNTER — Ambulatory Visit: Payer: BC Managed Care – PPO

## 2012-10-13 ENCOUNTER — Other Ambulatory Visit: Payer: Self-pay | Admitting: Medical Oncology

## 2012-10-13 DIAGNOSIS — K121 Other forms of stomatitis: Secondary | ICD-10-CM

## 2012-10-13 MED ORDER — MAGIC MOUTHWASH: 5.0000 mL | Freq: Four times a day (QID) | ORAL | Status: DC

## 2012-10-13 NOTE — Telephone Encounter (Signed)
Wife called to request refill on magic mouthwash -pt has " thrush again" . I sent to Dr Arbutus Ped for authorization.

## 2012-10-18 ENCOUNTER — Encounter: Payer: Self-pay | Admitting: Physician Assistant

## 2012-10-18 ENCOUNTER — Telehealth: Payer: Self-pay | Admitting: *Deleted

## 2012-10-18 ENCOUNTER — Other Ambulatory Visit (HOSPITAL_BASED_OUTPATIENT_CLINIC_OR_DEPARTMENT_OTHER): Payer: Medicare Other | Admitting: Lab

## 2012-10-18 ENCOUNTER — Ambulatory Visit (HOSPITAL_BASED_OUTPATIENT_CLINIC_OR_DEPARTMENT_OTHER): Payer: Medicare Other | Admitting: Physician Assistant

## 2012-10-18 ENCOUNTER — Telehealth: Payer: Self-pay | Admitting: Internal Medicine

## 2012-10-18 ENCOUNTER — Encounter: Payer: Self-pay | Admitting: Internal Medicine

## 2012-10-18 ENCOUNTER — Ambulatory Visit (HOSPITAL_BASED_OUTPATIENT_CLINIC_OR_DEPARTMENT_OTHER): Payer: Medicare Other

## 2012-10-18 VITALS — BP 116/70 | HR 68 | Temp 97.3°F | Resp 20 | Ht 67.0 in | Wt 161.7 lb

## 2012-10-18 DIAGNOSIS — C787 Secondary malignant neoplasm of liver and intrahepatic bile duct: Secondary | ICD-10-CM

## 2012-10-18 DIAGNOSIS — C259 Malignant neoplasm of pancreas, unspecified: Secondary | ICD-10-CM

## 2012-10-18 DIAGNOSIS — C252 Malignant neoplasm of tail of pancreas: Secondary | ICD-10-CM

## 2012-10-18 DIAGNOSIS — G629 Polyneuropathy, unspecified: Secondary | ICD-10-CM

## 2012-10-18 DIAGNOSIS — C50919 Malignant neoplasm of unspecified site of unspecified female breast: Secondary | ICD-10-CM

## 2012-10-18 DIAGNOSIS — C779 Secondary and unspecified malignant neoplasm of lymph node, unspecified: Secondary | ICD-10-CM

## 2012-10-18 DIAGNOSIS — Z5111 Encounter for antineoplastic chemotherapy: Secondary | ICD-10-CM

## 2012-10-18 LAB — CBC WITH DIFFERENTIAL/PLATELET
Eosinophils Absolute: 0.4 10*3/uL (ref 0.0–0.5)
LYMPH%: 9 % — ABNORMAL LOW (ref 14.0–49.0)
MCHC: 31.3 g/dL — ABNORMAL LOW (ref 32.0–36.0)
MONO#: 2 10*3/uL — ABNORMAL HIGH (ref 0.1–0.9)
NEUT#: 4.7 10*3/uL (ref 1.5–6.5)
Platelets: 661 10*3/uL — ABNORMAL HIGH (ref 140–400)
RBC: 3.18 10*6/uL — ABNORMAL LOW (ref 4.20–5.82)
RDW: 21.9 % — ABNORMAL HIGH (ref 11.0–14.6)
WBC: 7.8 10*3/uL (ref 4.0–10.3)

## 2012-10-18 LAB — COMPREHENSIVE METABOLIC PANEL (CC13)
Alkaline Phosphatase: 285 U/L — ABNORMAL HIGH (ref 40–150)
CO2: 27 mEq/L (ref 22–29)
Creatinine: 0.8 mg/dL (ref 0.7–1.3)
Glucose: 114 mg/dl — ABNORMAL HIGH (ref 70–99)
Sodium: 135 mEq/L — ABNORMAL LOW (ref 136–145)
Total Bilirubin: 0.51 mg/dL (ref 0.20–1.20)
Total Protein: 6.8 g/dL (ref 6.4–8.3)

## 2012-10-18 MED ORDER — GABAPENTIN 100 MG PO CAPS: 100.0000 mg | ORAL_CAPSULE | Freq: Three times a day (TID) | ORAL | Status: DC

## 2012-10-18 MED ORDER — PACLITAXEL PROTEIN-BOUND CHEMO INJECTION 100 MG
80.0000 mg/m2 | Freq: Once | INTRAVENOUS | Status: AC
  Administered 2012-10-18: 150 mg via INTRAVENOUS
  Filled 2012-10-18: qty 30

## 2012-10-18 MED ORDER — SODIUM CHLORIDE 0.9 % IV SOLN
800.0000 mg/m2 | Freq: Once | INTRAVENOUS | Status: AC
  Administered 2012-10-18: 1444 mg via INTRAVENOUS
  Filled 2012-10-18: qty 38

## 2012-10-18 MED ORDER — PROCHLORPERAZINE EDISYLATE 5 MG/ML IJ SOLN
10.0000 mg | Freq: Once | INTRAMUSCULAR | Status: AC
  Administered 2012-10-18: 10 mg via INTRAVENOUS

## 2012-10-18 NOTE — Progress Notes (Signed)
[facility] [phone]   [phone]  OFFICE PROGRESS NOTE  [name], MD Dr. Sharman Crate [place] [address] [place] [address]  DIAGNOSIS: Metastatic pancreatic adenocarcinoma, now with metastatic disease to the liver and the abdominal wall, initially diagnosed with stage IIIB(T3, N1, M0) pancreatic tail adenocarcinoma in November of 2011.   PRIOR THERAPY:  #1 distal pancreatectomy and splenectomy on 06/25/2010  #2 adjuvant chemotherapy with gemcitabine started 08/20/2010 and completed 2 cycles on 12/02/2010.  #3 status post concurrent chemoradiation with Xeloda from 12/15/2010 on 10 01/24/2011.  #4 status post 2 more cycles of adjuvant gemcitabine with gemcitabine completed on 04/04/2011.  #5 FOLFOX giving every 2 weeks status post 12 cycles.  #6 Continuous infusion 5-FU and leucovorin every 2 weeks. Status post 4 cycles discontinued today secondary to disease progression.   CURRENT THERAPY: The patient was started on 05/03/2012 the first cycle of systemic chemotherapy with gemcitabine 800 mg/M2 in addition to Abraxane 80 mg/M2 on weekly basis for 3 out of every 4 weeks. Status post 6 cycles.   INTERVAL HISTORY: Philip Shaw 78 y.o. male returns to the clinic today for followup visit accompanied by his wife. The patient is feeling fine today with no specific complaints. He is tolerating his current treatment with gemcitabine and Abraxane fairly well with no significant adverse effect. He continues to have mild peripheral neuropathy which is stable. He requests a refill for his gabapentin. She currently takes 100 mg by mouth 3 times daily and this seems to be a good dose for him. He requests a 90 day supply. He denied having any significant weight loss or night sweats. He denied having any chest pain, shortness breath, cough or hemoptysis.   MEDICAL HISTORY: Past Medical History  Diagnosis Date  . Heart murmur   . Abdominal  pain, periumbilical   . Constipation   . Diabetes mellitus   . Hypertension   . Sleep apnea     pt states he no longer uses his cpap - lost weight  . pancreatic ca dx'd 04/2010    METASTATIC PANCREATIC CA WITH METS TO LIVER AND ABDOMINAL WALL  -chemo every 2 weeks - ONCOLOGIST DR. MOHAMED MOHAMED WITH [facility].  Marland Kitchen Port-a-cath in place     RIGHT UPPER CHEST - FOR CHEMO EVERY 2 WEEKS    ALLERGIES:  has No Known Allergies.  MEDICATIONS:  Current Outpatient Prescriptions  Medication Sig Dispense Refill  . Alum & Mag Hydroxide-Simeth (MAGIC MOUTHWASH) SOLN Take 5 mLs by mouth QID.  240 mL  0  . aspirin 81 MG tablet Take 81 mg by mouth every morning.       . gabapentin (NEURONTIN) 100 MG capsule Take 1 capsule (100 mg total) by mouth 3 (three) times daily.  270 capsule  1  . insulin glargine (LANTUS) 100 UNIT/ML injection Inject into the skin at bedtime. 32 units every night except week of chemo pt would increase to 37      . lidocaine-prilocaine (EMLA) cream Apply 1 application topically as needed.  30 g  2  . methocarbamol (ROBAXIN) 500 MG tablet       . mirtazapine (REMERON) 30 MG tablet Take 1 tablet (30 mg total) by mouth at bedtime.  90 tablet  0  . ondansetron (ZOFRAN) 8 MG tablet Take 1 tablet (8 mg total) by mouth every 12 (twelve) hours as needed for nausea.  60 tablet  2  . oxyCODONE (  OXY IR/ROXICODONE) 5 MG immediate release tablet Take 1 tablet (5 mg total) by mouth every 4 (four) hours as needed.  30 tablet  0  . oxyCODONE (OXYCONTIN) 10 MG 12 hr tablet Take 1 tablet (10 mg total) by mouth every 12 (twelve) hours.  60 tablet  0  . ranitidine (ZANTAC) 150 MG tablet Take 150 mg by mouth 2 (two) times daily.       . Tamsulosin HCl (FLOMAX) 0.4 MG CAPS Once or twice daily      . terazosin (HYTRIN) 2 MG capsule Take 2 mg by mouth 2 (two) times daily.       . [DISCONTINUED] metFORMIN (GLUCOPHAGE) 850 MG tablet Take 850 mg by mouth 2 (two) times daily with a meal.         No current facility-administered medications for this visit.   Facility-Administered Medications Ordered in Other Visits  Medication Dose Route Frequency Provider Last Rate Last Dose  . sodium chloride 0.9 % injection 10 mL  10 mL Intracatheter PRN Si Gaul, MD   10 mL at 07/26/12 1715  . sodium chloride 0.9 % injection 10 mL  10 mL Intracatheter PRN Conni Slipper, PA   10 mL at 09/20/12 1736    SURGICAL HISTORY:  Past Surgical History  Procedure Laterality Date  . Appendectomy  21-40 years old  . Pancreatectomy  06/2010    with splenectomy  . Inguinal hernia repair  as infant, age 17    LIH- infant, RIH age 39  . Knee arthroscopy  04/23/2012    Procedure: ARTHROSCOPY KNEE;  Surgeon: Loanne Drilling, MD;  Location: [facility];  Service: Orthopedics;  Laterality: Right;  with Debridement    REVIEW OF SYSTEMS:  Pertinent items are noted in HPI.   PHYSICAL EXAMINATION: General appearance: alert, cooperative and no distress Head: Normocephalic, without obvious abnormality, atraumatic Neck: no adenopathy Lymph nodes: Cervical, supraclavicular, and axillary nodes normal. Resp: clear to auscultation bilaterally Cardio: regular rate and rhythm, S1, S2 normal, no murmur, click, rub or gallop GI: soft, non-tender; bowel sounds normal; no masses,  no organomegaly Extremities: extremities normal, atraumatic, no cyanosis or edema Neurologic: Alert and oriented X 3, normal strength and tone. Normal symmetric reflexes. Normal coordination and gait  ECOG PERFORMANCE STATUS: 1 - Symptomatic but completely ambulatory  Blood pressure 116/70, pulse 68, temperature 97.3 F (36.3 C), temperature source Oral, resp. rate 20, height 5\' 7"  (1.702 m), weight 161 lb 11.2 oz (73.347 kg).  LABORATORY DATA: Lab Results  Component Value Date   WBC 7.8 10/18/2012   HGB 9.0* 10/18/2012   HCT 28.8* 10/18/2012   MCV 90.6 10/18/2012   PLT 661* 10/18/2012      Chemistry      Component Value Date/Time    NA 135* 10/18/2012 1101   NA 136 04/19/2012 0930   K 4.7 10/18/2012 1101   K 4.3 04/19/2012 0930   CL 101 10/18/2012 1101   CL 100 04/19/2012 0930   CO2 27 10/18/2012 1101   CO2 28 04/19/2012 0930   BUN 22.7 10/18/2012 1101   BUN 18 04/19/2012 0930   CREATININE 0.8 10/18/2012 1101   CREATININE 0.76 04/19/2012 0930      Component Value Date/Time   CALCIUM 8.9 10/18/2012 1101   CALCIUM 9.2 04/19/2012 0930   ALKPHOS 285* 10/18/2012 1101   ALKPHOS 249* 04/19/2012 0930   AST 24 10/18/2012 1101   AST 45* 04/19/2012 0930   ALT 25 10/18/2012 1101   ALT  42 04/19/2012 0930   BILITOT 0.51 10/18/2012 1101   BILITOT 0.6 04/19/2012 0930       RADIOGRAPHIC STUDIES: CT CHEST, ABDOMEN AND PELVIS WITH CONTRAST  Technique: Multidetector CT imaging of the chest, abdomen and  pelvis was performed following the standard protocol during bolus  administration of intravenous contrast.  Contrast: OMNIPAQUE IOHEXOL 300 MG/ML SOLN  Comparison: CT of the chest and abdomen 06/24/2012.  CT CHEST  Findings:  Mediastinum: Heart size is normal. There is atherosclerosis of the  thoracic aorta, the great vessels of the mediastinum and the  coronary arteries, including calcified atherosclerotic plaque in  the the left anterior descending, circumflex and right coronary  arteries. There is no significant pericardial fluid, thickening or  pericardial calcification. No pathologically enlarged mediastinal  or hilar lymph nodes. Esophagus is unremarkable in appearance.  Right internal jugular single lumen Port-A-Cath with tip  terminating in the distal superior vena cava  Lungs/Pleura: There are a few scattered tiny calcified granulomas.  No larger more suspicious appearing pulmonary nodules or masses are  identified. No acute consolidative airspace disease. No pleural  effusions.  Musculoskeletal: There are no aggressive appearing lytic or blastic  lesions noted in the visualized portions of the skeleton.  IMPRESSION:   1. No findings to suggest metastatic disease to the thorax at this  time.  2. Atherosclerosis, including three-vessel coronary artery disease.  CT ABDOMEN AND PELVIS  Findings:  Abdomen/Pelvis: The lesion in segment 2 of the liver is slightly  smaller than the prior examination measuring 1.7 x 1.1 cm (image 52  of series 5) and demonstrates a general low attenuation with some  central enhancement. No new hepatic lesions are otherwise noted.  Large amount of pneumobilia in the intrahepatic biliary tree, and  small amount within the gallbladder. The pancreas is remarkable  for multifocal subcentimeter low attenuation lesions in the head,  with extensive pancreatic calcifications in the head, which are  unchanged. Postoperative changes of distal pancreatectomy and  splenectomy are noted. The appearance of the adrenal glands  bilaterally is unremarkable. Multiple renal lesions are noted  bilaterally, the majority of which are low attenuation and do not  enhance, compatible with simple cysts. Several lesions are too  small to definitively characterize. There is an irregular shaped  lesion in the lower pole of the right kidney which is similar in  size and appearance, likely representing a septated cyst. Multiple  nonobstructive calculi are noted within the collecting systems of  the kidneys bilaterally, largest of which measures 5 mm in the  lower pole collecting system of the right kidney.  A similar appearing stranding and mild nodularity is noted in the  omental fat adjacent to the splenic flexure of the colon. How much  of this represents postoperative scarring versus treated peritoneal  metastases is uncertain. No definite new peritoneal mass or  nodularity is otherwise appreciated. No significant volume of  ascites. No pneumoperitoneum. No pathologic distension of small  bowel. There are a few colonic diverticula, without surrounding  inflammatory changes to suggest acute  diverticulitis at this time.  Prostate gland is mildly heterogeneous. Urinary bladder is  unremarkable in appearance.  Musculoskeletal: There are no aggressive appearing lytic or blastic  lesions noted in the visualized portions of the skeleton.  IMPRESSION:  1. Status post distal pancreatectomy without definitive evidence  to suggest local recurrence of disease. Lesion in segment 2 of the  liver appears slightly smaller than the prior examination,  measuring 1.7  x 1.1 cm on today's study, and no new hepatic lesions  are noted. A small amount of omental soft tissue stranding and  nodularity predominately adjacent to the splenic flexure is similar  to the prior examination; how much of this represents omental  metastases versus postoperative scarring is uncertain. No new  sites of metastatic disease are identified on today's examination.  2. Additional incidental findings, as above, similar to prior  studies.  Original Report Authenticated By: Trudie Reed, M.D.    ASSESSMENT/PLAN: This is a very pleasant 78 years old white male with history of metastatic pancreatic adenocarcinoma currently on treatment with gemcitabine and Abraxane status post 6 cycles of treatment and the patient is tolerating it fairly well. He has no evidence for disease progression on his last scan. The patient was discussed with Dr. Arbutus Ped. He will continue on his current chemotherapy. He will followup with Dr. Arbutus Ped in 4 weeks at the start of his next cycle of systemic chemotherapy with gemcitabine at 80 mg/M2 in addition to Abraxane 80 mg/M2 given weekly for 3 out of 4 weeks, with a repeat CBC differential, C. met and CT of the chest, abdomen and pelvis with contrast to reevaluate his disease. He'll have stat labs drawn on the day of his restaging CT scan.  Philip Shaw, Philip Nease E, PA-C   All questions were answered. The patient knows to call the clinic with any problems, questions or concerns. We can certainly see  the patient much sooner if necessary.  I spent 20 minutes counseling the patient face to face. The total time spent in the appointment was 30 minutes.

## 2012-10-18 NOTE — Patient Instructions (Addendum)
Followup with Dr. Arbutus Ped in 4 weeks with restaging CT scan of your chest, abdomen and pelvis to reevaluate your disease

## 2012-10-18 NOTE — Patient Instructions (Addendum)
[facility] Discharge Instructions for Patients Receiving Chemotherapy  Today you received the following chemotherapy agents: gemzar, abrazane  To help prevent nausea and vomiting after your treatment, we encourage you to take your nausea medication.  Take it as often as prescribed.     If you develop nausea and vomiting that is not controlled by your nausea medication, call the clinic. If it is after clinic hours your family physician or the after hours number for the clinic or go to the Emergency Department.   BELOW ARE SYMPTOMS THAT SHOULD BE REPORTED IMMEDIATELY:  *FEVER GREATER THAN 100.5 F  *CHILLS WITH OR WITHOUT FEVER  NAUSEA AND VOMITING THAT IS NOT CONTROLLED WITH YOUR NAUSEA MEDICATION  *UNUSUAL SHORTNESS OF BREATH  *UNUSUAL BRUISING OR BLEEDING  TENDERNESS IN MOUTH AND THROAT WITH OR WITHOUT PRESENCE OF ULCERS  *URINARY PROBLEMS  *BOWEL PROBLEMS  UNUSUAL RASH Items with * indicate a potential emergency and should be followed up as soon as possible.  Feel free to call the clinic you have any questions or concerns. The clinic phone number is [phone].   I have been informed and understand all the instructions given to me. I know to contact the clinic, my physician, or go to the Emergency Department if any problems should occur. I do not have any questions at this time, but understand that I may call the clinic during office hours   should I have any questions or need assistance in obtaining follow up care.    __________________________________________  _____________  __________ Signature of Patient or Authorized Representative            Date                   Time    __________________________________________ Nurse's Signature

## 2012-10-18 NOTE — Telephone Encounter (Signed)
Per staff message and POF I have scheduled appts.  [name]  

## 2012-10-22 ENCOUNTER — Telehealth: Payer: Self-pay | Admitting: Internal Medicine

## 2012-10-22 NOTE — Telephone Encounter (Signed)
, °

## 2012-10-25 ENCOUNTER — Other Ambulatory Visit: Payer: Self-pay | Admitting: Medical Oncology

## 2012-10-25 ENCOUNTER — Other Ambulatory Visit (HOSPITAL_BASED_OUTPATIENT_CLINIC_OR_DEPARTMENT_OTHER): Payer: Medicare Other | Admitting: Lab

## 2012-10-25 ENCOUNTER — Other Ambulatory Visit: Payer: Self-pay | Admitting: Internal Medicine

## 2012-10-25 ENCOUNTER — Ambulatory Visit (HOSPITAL_BASED_OUTPATIENT_CLINIC_OR_DEPARTMENT_OTHER): Payer: Medicare Other

## 2012-10-25 VITALS — BP 91/55 | HR 80 | Temp 98.1°F | Resp 16

## 2012-10-25 DIAGNOSIS — C252 Malignant neoplasm of tail of pancreas: Secondary | ICD-10-CM

## 2012-10-25 DIAGNOSIS — R52 Pain, unspecified: Secondary | ICD-10-CM

## 2012-10-25 DIAGNOSIS — C787 Secondary malignant neoplasm of liver and intrahepatic bile duct: Secondary | ICD-10-CM

## 2012-10-25 DIAGNOSIS — C50919 Malignant neoplasm of unspecified site of unspecified female breast: Secondary | ICD-10-CM

## 2012-10-25 DIAGNOSIS — C259 Malignant neoplasm of pancreas, unspecified: Secondary | ICD-10-CM

## 2012-10-25 DIAGNOSIS — Z5111 Encounter for antineoplastic chemotherapy: Secondary | ICD-10-CM

## 2012-10-25 LAB — CBC WITH DIFFERENTIAL/PLATELET
Basophils Absolute: 0 10*3/uL (ref 0.0–0.1)
Eosinophils Absolute: 0.2 10*3/uL (ref 0.0–0.5)
HCT: 27.2 % — ABNORMAL LOW (ref 38.4–49.9)
HGB: 8.7 g/dL — ABNORMAL LOW (ref 13.0–17.1)
LYMPH%: 13.3 % — ABNORMAL LOW (ref 14.0–49.0)
MCV: 89.8 fL (ref 79.3–98.0)
MONO#: 1.4 10*3/uL — ABNORMAL HIGH (ref 0.1–0.9)
MONO%: 23.4 % — ABNORMAL HIGH (ref 0.0–14.0)
NEUT#: 3.5 10*3/uL (ref 1.5–6.5)
NEUT%: 59.1 % (ref 39.0–75.0)
Platelets: 481 10*3/uL (ref <2.0)
WBC: 6 10*3/uL (ref 4.0–10.3)
nRBC: 2 % — ABNORMAL HIGH (ref 0–0)

## 2012-10-25 LAB — COMPREHENSIVE METABOLIC PANEL (CC13)
ALT: 43 U/L (ref 0–55)
Albumin: 2.7 g/dL — ABNORMAL LOW (ref 3.5–5.0)
CO2: 28 mEq/L (ref 22–29)
Potassium: 4.6 mEq/L (ref 3.5–5.1)
Sodium: 136 mEq/L (ref 136–145)
Total Bilirubin: 0.39 mg/dL (ref 0.20–1.20)
Total Protein: 6.6 g/dL (ref 6.4–8.3)

## 2012-10-25 MED ORDER — SODIUM CHLORIDE 0.9 % IV SOLN
Freq: Once | INTRAVENOUS | Status: AC
  Administered 2012-10-25: 09:00:00 via INTRAVENOUS

## 2012-10-25 MED ORDER — OXYCODONE HCL 10 MG PO TB12: 10.0000 mg | ORAL_TABLET | Freq: Two times a day (BID) | ORAL | Status: DC

## 2012-10-25 MED ORDER — PROCHLORPERAZINE EDISYLATE 5 MG/ML IJ SOLN
10.0000 mg | Freq: Once | INTRAMUSCULAR | Status: AC
  Administered 2012-10-25: 10 mg via INTRAVENOUS

## 2012-10-25 MED ORDER — SODIUM CHLORIDE 0.9 % IV SOLN
800.0000 mg/m2 | Freq: Once | INTRAVENOUS | Status: AC
  Administered 2012-10-25: 1444 mg via INTRAVENOUS
  Filled 2012-10-25: qty 38

## 2012-10-25 MED ORDER — SODIUM CHLORIDE 0.9 % IJ SOLN
10.0000 mL | INTRAMUSCULAR | Status: DC | PRN
  Administered 2012-10-25: 10 mL
  Filled 2012-10-25: qty 10

## 2012-10-25 MED ORDER — HEPARIN SOD (PORK) LOCK FLUSH 100 UNIT/ML IV SOLN
500.0000 [IU] | Freq: Once | INTRAVENOUS | Status: AC | PRN
  Administered 2012-10-25: 500 [IU]
  Filled 2012-10-25: qty 5

## 2012-10-25 MED ORDER — PACLITAXEL PROTEIN-BOUND CHEMO INJECTION 100 MG
80.0000 mg/m2 | Freq: Once | INTRAVENOUS | Status: AC
  Administered 2012-10-25: 150 mg via INTRAVENOUS
  Filled 2012-10-25: qty 30

## 2012-10-25 NOTE — Patient Instructions (Signed)
[facility] Discharge Instructions for Patients Receiving Chemotherapy  Today you received the following chemotherapy agents: gemzar, abraxane  To help prevent nausea and vomiting after your treatment, we encourage you to take your nausea medication.  Take it as often as prescribed.     If you develop nausea and vomiting that is not controlled by your nausea medication, call the clinic. If it is after clinic hours your family physician or the after hours number for the clinic or go to the Emergency Department.   BELOW ARE SYMPTOMS THAT SHOULD BE REPORTED IMMEDIATELY:  *FEVER GREATER THAN 100.5 F  *CHILLS WITH OR WITHOUT FEVER  NAUSEA AND VOMITING THAT IS NOT CONTROLLED WITH YOUR NAUSEA MEDICATION  *UNUSUAL SHORTNESS OF BREATH  *UNUSUAL BRUISING OR BLEEDING  TENDERNESS IN MOUTH AND THROAT WITH OR WITHOUT PRESENCE OF ULCERS  *URINARY PROBLEMS  *BOWEL PROBLEMS  UNUSUAL RASH Items with * indicate a potential emergency and should be followed up as soon as possible.  Feel free to call the clinic you have any questions or concerns. The clinic phone number is [phone].   I have been informed and understand all the instructions given to me. I know to contact the clinic, my physician, or go to the Emergency Department if any problems should occur. I do not have any questions at this time, but understand that I may call the clinic during office hours   should I have any questions or need assistance in obtaining follow up care.    __________________________________________  _____________  __________ Signature of Patient or Authorized Representative            Date                   Time    __________________________________________ Nurse's Signature

## 2012-10-25 NOTE — Progress Notes (Signed)
erroneous

## 2012-10-25 NOTE — Telephone Encounter (Signed)
Request refill . rx signed and given to pt.

## 2012-11-01 ENCOUNTER — Ambulatory Visit (HOSPITAL_BASED_OUTPATIENT_CLINIC_OR_DEPARTMENT_OTHER): Payer: Medicare Other

## 2012-11-01 ENCOUNTER — Other Ambulatory Visit: Payer: Self-pay | Admitting: Internal Medicine

## 2012-11-01 ENCOUNTER — Other Ambulatory Visit (HOSPITAL_BASED_OUTPATIENT_CLINIC_OR_DEPARTMENT_OTHER): Payer: Medicare Other | Admitting: Lab

## 2012-11-01 VITALS — BP 105/63 | HR 87 | Temp 97.6°F

## 2012-11-01 DIAGNOSIS — C787 Secondary malignant neoplasm of liver and intrahepatic bile duct: Secondary | ICD-10-CM

## 2012-11-01 DIAGNOSIS — C50919 Malignant neoplasm of unspecified site of unspecified female breast: Secondary | ICD-10-CM

## 2012-11-01 DIAGNOSIS — C252 Malignant neoplasm of tail of pancreas: Secondary | ICD-10-CM

## 2012-11-01 DIAGNOSIS — C259 Malignant neoplasm of pancreas, unspecified: Secondary | ICD-10-CM

## 2012-11-01 DIAGNOSIS — Z5111 Encounter for antineoplastic chemotherapy: Secondary | ICD-10-CM

## 2012-11-01 LAB — CBC WITH DIFFERENTIAL/PLATELET
BASO%: 0.3 % (ref 0.0–2.0)
LYMPH%: 14.3 % (ref 14.0–49.0)
MCHC: 32.3 g/dL (ref 32.0–36.0)
MCV: 90.2 fL (ref 79.3–98.0)
MONO#: 1.2 10*3/uL — ABNORMAL HIGH (ref 0.1–0.9)
MONO%: 20.7 % — ABNORMAL HIGH (ref 0.0–14.0)
Platelets: 206 10*3/uL (ref 140–400)
RBC: 2.95 10*6/uL — ABNORMAL LOW (ref 4.20–5.82)
RDW: 22 % — ABNORMAL HIGH (ref 11.0–14.6)
WBC: 5.8 10*3/uL (ref 4.0–10.3)
nRBC: 2 % — ABNORMAL HIGH (ref 0–0)

## 2012-11-01 LAB — COMPREHENSIVE METABOLIC PANEL (CC13)
ALT: 49 U/L (ref 0–55)
AST: 33 U/L (ref 5–34)
Calcium: 8.8 mg/dL (ref 8.4–10.4)
Chloride: 101 mEq/L (ref 98–107)
Creatinine: 0.8 mg/dL (ref 0.7–1.3)

## 2012-11-01 MED ORDER — PACLITAXEL PROTEIN-BOUND CHEMO INJECTION 100 MG
80.0000 mg/m2 | Freq: Once | INTRAVENOUS | Status: AC
  Administered 2012-11-01: 150 mg via INTRAVENOUS
  Filled 2012-11-01: qty 30

## 2012-11-01 MED ORDER — SODIUM CHLORIDE 0.9 % IV SOLN
800.0000 mg/m2 | Freq: Once | INTRAVENOUS | Status: AC
  Administered 2012-11-01: 1444 mg via INTRAVENOUS
  Filled 2012-11-01: qty 38

## 2012-11-01 MED ORDER — HEPARIN SOD (PORK) LOCK FLUSH 100 UNIT/ML IV SOLN
500.0000 [IU] | Freq: Once | INTRAVENOUS | Status: AC | PRN
  Administered 2012-11-01: 500 [IU]
  Filled 2012-11-01: qty 5

## 2012-11-01 MED ORDER — SODIUM CHLORIDE 0.9 % IJ SOLN
10.0000 mL | INTRAMUSCULAR | Status: DC | PRN
  Administered 2012-11-01: 10 mL
  Filled 2012-11-01: qty 10

## 2012-11-01 MED ORDER — PROCHLORPERAZINE EDISYLATE 5 MG/ML IJ SOLN
10.0000 mg | Freq: Once | INTRAMUSCULAR | Status: AC
  Administered 2012-11-01: 10 mg via INTRAVENOUS

## 2012-11-01 MED ORDER — SODIUM CHLORIDE 0.9 % IV SOLN
Freq: Once | INTRAVENOUS | Status: AC
  Administered 2012-11-01: 14:00:00 via INTRAVENOUS

## 2012-11-01 NOTE — Patient Instructions (Addendum)
[facility] Discharge Instructions for Patients Receiving Chemotherapy  Today you received the following chemotherapy agents Abraxane and Gemzar.  To help prevent nausea and vomiting after your treatment, we encourage you to take your nausea medication as ordered per MD.    If you develop nausea and vomiting that is not controlled by your nausea medication, call the clinic. If it is after clinic hours your family physician or the after hours number for the clinic or go to the Emergency Department.   BELOW ARE SYMPTOMS THAT SHOULD BE REPORTED IMMEDIATELY:  *FEVER GREATER THAN 100.5 F  *CHILLS WITH OR WITHOUT FEVER  NAUSEA AND VOMITING THAT IS NOT CONTROLLED WITH YOUR NAUSEA MEDICATION  *UNUSUAL SHORTNESS OF BREATH  *UNUSUAL BRUISING OR BLEEDING  TENDERNESS IN MOUTH AND THROAT WITH OR WITHOUT PRESENCE OF ULCERS  *URINARY PROBLEMS  *BOWEL PROBLEMS  UNUSUAL RASH Items with * indicate a potential emergency and should be followed up as soon as possible.   Please let the nurse know about any problems that you may have experienced. Feel free to call the clinic you have any questions or concerns. The clinic phone number is [phone].   I have been informed and understand all the instructions given to me. I know to contact the clinic, my physician, or go to the Emergency Department if any problems should occur. I do not have any questions at this time, but understand that I may call the clinic during office hours   should I have any questions or need assistance in obtaining follow up care.    __________________________________________  _____________  __________ Signature of Patient or Authorized Representative            Date                   Time    __________________________________________ Nurse's Signature

## 2012-11-08 ENCOUNTER — Ambulatory Visit (HOSPITAL_COMMUNITY)
Admission: RE | Admit: 2012-11-08 | Discharge: 2012-11-08 | Disposition: A | Discharge status: Home / Self Care | Payer: Medicare Other | Source: Ambulatory Visit | Attending: Internal Medicine | Admitting: Internal Medicine

## 2012-11-08 ENCOUNTER — Encounter (HOSPITAL_COMMUNITY): Payer: Self-pay

## 2012-11-08 ENCOUNTER — Ambulatory Visit (HOSPITAL_COMMUNITY): Payer: Medicare Other

## 2012-11-08 DIAGNOSIS — C259 Malignant neoplasm of pancreas, unspecified: Secondary | ICD-10-CM

## 2012-11-08 DIAGNOSIS — C494 Malignant neoplasm of connective and soft tissue of abdomen: Secondary | ICD-10-CM | POA: Insufficient documentation

## 2012-11-08 DIAGNOSIS — C787 Secondary malignant neoplasm of liver and intrahepatic bile duct: Secondary | ICD-10-CM | POA: Insufficient documentation

## 2012-11-08 DIAGNOSIS — K7689 Other specified diseases of liver: Secondary | ICD-10-CM | POA: Insufficient documentation

## 2012-11-08 MED ORDER — IOHEXOL 300 MG/ML  SOLN
100.0000 mL | Freq: Once | INTRAMUSCULAR | Status: AC | PRN
  Administered 2012-11-08: 100 mL via INTRAVENOUS

## 2012-11-15 ENCOUNTER — Telehealth: Payer: Self-pay | Admitting: Internal Medicine

## 2012-11-15 ENCOUNTER — Encounter: Payer: Self-pay | Admitting: Internal Medicine

## 2012-11-15 ENCOUNTER — Ambulatory Visit: Payer: Medicare Other

## 2012-11-15 ENCOUNTER — Other Ambulatory Visit (HOSPITAL_BASED_OUTPATIENT_CLINIC_OR_DEPARTMENT_OTHER): Payer: Medicare Other | Admitting: Lab

## 2012-11-15 ENCOUNTER — Ambulatory Visit (HOSPITAL_BASED_OUTPATIENT_CLINIC_OR_DEPARTMENT_OTHER): Payer: Medicare Other | Admitting: Internal Medicine

## 2012-11-15 VITALS — BP 116/66 | HR 92 | Temp 97.1°F | Resp 20 | Ht 67.0 in | Wt 161.4 lb

## 2012-11-15 DIAGNOSIS — G609 Hereditary and idiopathic neuropathy, unspecified: Secondary | ICD-10-CM

## 2012-11-15 DIAGNOSIS — C259 Malignant neoplasm of pancreas, unspecified: Secondary | ICD-10-CM

## 2012-11-15 DIAGNOSIS — C252 Malignant neoplasm of tail of pancreas: Secondary | ICD-10-CM

## 2012-11-15 DIAGNOSIS — C779 Secondary and unspecified malignant neoplasm of lymph node, unspecified: Secondary | ICD-10-CM

## 2012-11-15 DIAGNOSIS — C787 Secondary malignant neoplasm of liver and intrahepatic bile duct: Secondary | ICD-10-CM

## 2012-11-15 LAB — CBC WITH DIFFERENTIAL/PLATELET
Basophils Absolute: 0 10*3/uL (ref 0.0–0.1)
Eosinophils Absolute: 0.5 10*3/uL (ref 0.0–0.5)
HGB: 8.8 g/dL — ABNORMAL LOW (ref 13.0–17.1)
MONO#: 1.8 10*3/uL — ABNORMAL HIGH (ref 0.1–0.9)
MONO%: 28.5 % — ABNORMAL HIGH (ref 0.0–14.0)
NEUT#: 3.5 10*3/uL (ref 1.5–6.5)
RBC: 3.12 10*6/uL — ABNORMAL LOW (ref 4.20–5.82)
RDW: 21.8 % — ABNORMAL HIGH (ref 11.0–14.6)
WBC: 6.4 10*3/uL (ref 4.0–10.3)
lymph#: 0.6 10*3/uL — ABNORMAL LOW (ref 0.9–3.3)
nRBC: 0 % (ref 0–0)

## 2012-11-15 NOTE — Patient Instructions (Signed)
Your recent scan showed evidence for disease progression in the liver. We discussed treatment options including treatment with FOLFIRI as well as referred for a second opinion at [facility]. Followup in 2 weeks.

## 2012-11-15 NOTE — Telephone Encounter (Signed)
gv and printed appt schedule for pt for April....gv referral to [facility]mer in medical records...the patient aware

## 2012-11-15 NOTE — Progress Notes (Signed)
[facility] Telephone:(336) [phone]   Fax:(336) [phone]  OFFICE PROGRESS NOTE  Philip Ravel, MD Dr. Sharman Crate Hamrick [address] [place] [address]  DIAGNOSIS: Metastatic pancreatic adenocarcinoma, now with metastatic disease to the liver and the abdominal wall, initially diagnosed with stage IIIB(T3, N1, M0) pancreatic tail adenocarcinoma in November of 2011.   PRIOR THERAPY:  #1 distal pancreatectomy and splenectomy on 06/25/2010  #2 adjuvant chemotherapy with gemcitabine started 08/20/2010 and completed 2 cycles on 12/02/2010.  #3 status post concurrent chemoradiation with Xeloda from 12/15/2010 on 10 01/24/2011.  #4 status post 2 more cycles of adjuvant gemcitabine with gemcitabine completed on 04/04/2011.  #5 FOLFOX giving every 2 weeks status post 12 cycles.  #6 Continuous infusion 5-FU and leucovorin every 2 weeks. Status post 4 cycles discontinued today secondary to disease progression.  #7 The patient was started on 05/03/2012 the first cycle of systemic chemotherapy with gemcitabine 800 mg/M2 in addition to Abraxane 80 mg/M2 on weekly basis for 3 out of every 4 weeks. Status post 6 cycles, discontinued today secondary to disease progression.   CURRENT THERAPY: The patient was started on 05/03/2012 the first cycle of systemic chemotherapy with gemcitabine 800 mg/M2 in addition to Abraxane 80 mg/M2 on weekly basis for 3 out of every 4 weeks. Status post 6 cycles.  INTERVAL HISTORY: Philip Shaw 78 y.o. male returns to the clinic today for his wife and daughter. The patient is feeling fine today with no specific complaints. He denied having any significant chest pain, shortness breath, cough or hemoptysis. He has occasional pain at the right upper quadrant of the abdomen. He denied having any significant weight loss or night sweats. He tolerated the last cycle of his chemotherapy with gemcitabine and Abraxane fairly well. He had repeat CT scan of  the chest, abdomen and pelvis performed recently and he is here for evaluation and discussion of his scan results.  MEDICAL HISTORY: Past Medical History  Diagnosis Date  . Heart murmur   . Abdominal pain, periumbilical   . Constipation   . Diabetes mellitus   . Hypertension   . Sleep apnea     pt states he no longer uses his cpap - lost weight  . pancreatic ca dx'd 04/2010    METASTATIC PANCREATIC CA WITH METS TO LIVER AND ABDOMINAL WALL  -chemo every 2 weeks - ONCOLOGIST DR. Dewey Neukam WITH [facility].  Marland Kitchen Port-a-cath in place     RIGHT UPPER CHEST - FOR CHEMO EVERY 2 WEEKS    ALLERGIES:  has No Known Allergies.  MEDICATIONS:  Current Outpatient Prescriptions  Medication Sig Dispense Refill  . Alum & Mag Hydroxide-Simeth (MAGIC MOUTHWASH) SOLN Take 5 mLs by mouth QID.  240 mL  0  . aspirin 81 MG tablet Take 81 mg by mouth every morning.       . gabapentin (NEURONTIN) 100 MG capsule Take 1 capsule (100 mg total) by mouth 3 (three) times daily.  270 capsule  1  . insulin glargine (LANTUS) 100 UNIT/ML injection Inject into the skin at bedtime. 32 units every night except week of chemo pt would increase to 37      . lidocaine-prilocaine (EMLA) cream Apply 1 application topically as needed.  30 g  2  . mirtazapine (REMERON) 30 MG tablet Take 1 tablet (30 mg total) by mouth at bedtime.  90 tablet  0  . ondansetron (ZOFRAN) 8 MG tablet Take 1 tablet (8  mg total) by mouth every 12 (twelve) hours as needed for nausea.  60 tablet  2  . oxyCODONE (OXY IR/ROXICODONE) 5 MG immediate release tablet Take 1 tablet (5 mg total) by mouth every 4 (four) hours as needed.  30 tablet  0  . oxyCODONE (OXYCONTIN) 10 MG 12 hr tablet Take 1 tablet (10 mg total) by mouth every 12 (twelve) hours.  60 tablet  0  . ranitidine (ZANTAC) 150 MG tablet Take 150 mg by mouth 2 (two) times daily.       . Tamsulosin HCl (FLOMAX) 0.4 MG CAPS Once or twice daily      . terazosin (HYTRIN) 2 MG capsule  Take 2 mg by mouth 2 (two) times daily.       . [DISCONTINUED] metFORMIN (GLUCOPHAGE) 850 MG tablet Take 850 mg by mouth 2 (two) times daily with a meal.        No current facility-administered medications for this visit.   Facility-Administered Medications Ordered in Other Visits  Medication Dose Route Frequency Provider Last Rate Last Dose  . sodium chloride 0.9 % injection 10 mL  10 mL Intracatheter PRN Si Gaul, MD   10 mL at 07/26/12 1715  . sodium chloride 0.9 % injection 10 mL  10 mL Intracatheter PRN Conni Slipper, PA-C   10 mL at 09/20/12 1736    SURGICAL HISTORY:  Past Surgical History  Procedure Laterality Date  . Appendectomy  53-55 years old  . Pancreatectomy  06/2010    with splenectomy  . Inguinal hernia repair  as infant, age 17    LIH- infant, RIH age 59  . Knee arthroscopy  04/23/2012    Procedure: ARTHROSCOPY KNEE;  Surgeon: Loanne Drilling, MD;  Location: [facility];  Service: Orthopedics;  Laterality: Right;  with Debridement    REVIEW OF SYSTEMS:  A comprehensive review of systems was negative except for: Gastrointestinal: positive for abdominal pain   PHYSICAL EXAMINATION: General appearance: alert, cooperative and no distress Head: Normocephalic, without obvious abnormality, atraumatic Neck: no adenopathy Lymph nodes: Cervical, supraclavicular, and axillary nodes normal. Resp: clear to auscultation bilaterally Cardio: regular rate and rhythm, S1, S2 normal, no murmur, click, rub or gallop GI: soft, non-tender; bowel sounds normal; no masses,  no organomegaly Extremities: extremities normal, atraumatic, no cyanosis or edema Neurologic: Alert and oriented X 3, normal strength and tone. Normal symmetric reflexes. Normal coordination and gait  ECOG PERFORMANCE STATUS: 1 - Symptomatic but completely ambulatory  Blood pressure 116/66, pulse 92, temperature 97.1 F (36.2 C), temperature source Oral, resp. rate 20, height 5\' 7"  (1.702 m), weight 161 lb 6.4 oz  (73.211 kg).  LABORATORY DATA: Lab Results  Component Value Date   WBC 6.4 11/15/2012   HGB 8.8* 11/15/2012   HCT 27.6* 11/15/2012   MCV 88.5 11/15/2012   PLT 584* 11/15/2012      Chemistry      Component Value Date/Time   NA 135* 11/01/2012 1226   NA 136 04/19/2012 0930   K 4.6 11/01/2012 1226   K 4.3 04/19/2012 0930   CL 101 11/01/2012 1226   CL 100 04/19/2012 0930   CO2 28 11/01/2012 1226   CO2 28 04/19/2012 0930   BUN 26.6* 11/01/2012 1226   BUN 18 04/19/2012 0930   CREATININE 0.8 11/01/2012 1226   CREATININE 0.76 04/19/2012 0930      Component Value Date/Time   CALCIUM 8.8 11/01/2012 1226   CALCIUM 9.2 04/19/2012 0930   ALKPHOS 314* 11/01/2012  1226   ALKPHOS 249* 04/19/2012 0930   AST 33 11/01/2012 1226   AST 45* 04/19/2012 0930   ALT 49 11/01/2012 1226   ALT 42 04/19/2012 0930   BILITOT 0.42 11/01/2012 1226   BILITOT 0.6 04/19/2012 0930       RADIOGRAPHIC STUDIES: Ct Chest W Contrast  11/08/2012  *RADIOLOGY REPORT*  Clinical Data:  Metastatic pancreatic adenocarcinoma with metastatic disease to the liver and abdominal wall.  Initially diagnosed with stage III B disease of the pancreatic tail in November 2011.  CT CHEST, ABDOMEN AND PELVIS WITH CONTRAST  Technique:  Multidetector CT imaging of the chest, abdomen and pelvis was performed following the standard protocol during bolus administration of intravenous contrast.  Contrast: OMNIPAQUE IOHEXOL 300 MG/ML  SOLN  Comparison:  08/23/2012  CT CHEST  Findings:  The tip of the right sided Port-A-Cath is positioned at the mid SVC level.  There is no axillary, mediastinal, or hilar lymphadenopathy.  Heart size is normal.  Coronary artery calcification is noted.  Subsegmental atelectasis noted in the right lower lobe.  There is no parenchymal nodule or mass in either lung.  No focal airspace consolidation.  Bone windows reveal no worrisome lytic or sclerotic osseous lesions.  IMPRESSION: Stable.  No evidence for metastatic disease in the  chest.   CT ABDOMEN AND PELVIS  Findings:  The necrotic lesion in the lateral segment of the left liver has progressed, measuring up to 3.3 cm in diameter. Pneumobilia persists.  The spleen is surgically absent.  The patient is status post resection of the pancreatic tail.  Dystrophic calcification is noted in the head of the pancreas.  Strandy soft tissue attenuation in the anterior pararenal space of the medial left abdomen is not substantially changed.  This is in the pancreatectomy bed.  The stomach, duodenum, gallbladder, and adrenal glands are unremarkable.  Cysts are noted in the kidneys bilaterally and bilateral nonobstructing renal stones are again noted.  No abdominal aortic aneurysm.  No retroperitoneal lymphadenopathy. The portal vein and superior mesenteric vein are patent.  Imaging through the pelvis shows no free intraperitoneal fluid. Prostate gland is mildly enlarged.  No pelvic sidewall lymphadenopathy.  No colonic diverticulitis.  Terminal ileum is normal. The appendix is not visualized, but there is no edema or inflammation in the region of the cecum.  Small left inguinal hernia contains only fat. Bone windows reveal no worrisome lytic or sclerotic osseous lesions.  IMPRESSION: Interval progression of the lesion involving the lateral segment left liver, now measuring up to 3.3 cm in diameter compared 1.7 cm previously.  This is compatible with progression of metastatic involvement.  Stable soft tissue stranding in the anterior pararenal space in the medial left abdomen, likely postsurgical.  Subtle stranding in the region of the left omentum is also unchanged.  Continued attention to these regions on follow-up imaging recommended.   Original Report Authenticated By: Kennith Center, M.D.    ASSESSMENT: This is a very pleasant 78 years old white male with history of metastatic pancreatic adenocarcinoma most recently treated with 6 cycles of chemotherapy with Abraxane and gemcitabine. He had  initial response but unfortunately has evidence for disease progression especially in the lateral segment of the left liver lobe.   PLAN: I discussed the scan results with the patient and his family. I would discontinue his current treatment with gemcitabine and Abraxane.  I discussed with the patient in other treatment options including treatment with FOLFIRI. He received FOLFOX in the past  and still have some issues with peripheral neuropathy. I do not think he would like FOLFIRINOX would be appropriate for this patient. I also recommended for the patient evaluation at [facility] for second opinion regarding his cancer and to see the patient would be eligible for any clinical trial there. I will refer him to Dr. Olga Millers or one of his partners. I would see the patient back for followup visit in 2 weeks for reevaluation. The patient and his family agreed to the current plan. He was advised to call me immediately if he has any concerning symptoms in the interval.  All questions were answered. The patient knows to call the clinic with any problems, questions or concerns. We can certainly see the patient much sooner if necessary.  I spent 15 minutes counseling the patient face to face. The total time spent in the appointment was 25 minutes.

## 2012-11-16 ENCOUNTER — Telehealth: Payer: Self-pay | Admitting: Internal Medicine

## 2012-11-16 NOTE — Telephone Encounter (Signed)
Pt appt. With Dr. Olga Millers @ [facility] is 11/18/12@1 :00. Faxed medical records,slide will be fedex'ed. Pt is aware to hand carry cd

## 2012-11-19 ENCOUNTER — Telehealth: Payer: Self-pay | Admitting: *Deleted

## 2012-11-19 NOTE — Telephone Encounter (Signed)
Progress note from [facility] 11/18/12 from Dr Olga Millers given to Dr Donnald Garre to review.  SLJ

## 2012-12-01 ENCOUNTER — Telehealth: Payer: Self-pay | Admitting: *Deleted

## 2012-12-01 ENCOUNTER — Encounter: Payer: Self-pay | Admitting: Internal Medicine

## 2012-12-01 ENCOUNTER — Ambulatory Visit (HOSPITAL_BASED_OUTPATIENT_CLINIC_OR_DEPARTMENT_OTHER): Payer: Medicare Other | Admitting: Internal Medicine

## 2012-12-01 ENCOUNTER — Other Ambulatory Visit (HOSPITAL_BASED_OUTPATIENT_CLINIC_OR_DEPARTMENT_OTHER): Payer: Medicare Other | Admitting: Lab

## 2012-12-01 VITALS — BP 109/64 | HR 79 | Temp 97.3°F | Resp 20 | Ht 67.0 in | Wt 161.2 lb

## 2012-12-01 DIAGNOSIS — C50919 Malignant neoplasm of unspecified site of unspecified female breast: Secondary | ICD-10-CM

## 2012-12-01 DIAGNOSIS — R1011 Right upper quadrant pain: Secondary | ICD-10-CM

## 2012-12-01 DIAGNOSIS — C259 Malignant neoplasm of pancreas, unspecified: Secondary | ICD-10-CM

## 2012-12-01 DIAGNOSIS — C779 Secondary and unspecified malignant neoplasm of lymph node, unspecified: Secondary | ICD-10-CM

## 2012-12-01 DIAGNOSIS — C252 Malignant neoplasm of tail of pancreas: Secondary | ICD-10-CM

## 2012-12-01 DIAGNOSIS — C787 Secondary malignant neoplasm of liver and intrahepatic bile duct: Secondary | ICD-10-CM

## 2012-12-01 LAB — COMPREHENSIVE METABOLIC PANEL (CC13)
ALT: 22 U/L (ref 0–55)
AST: 23 U/L (ref 5–34)
Albumin: 3 g/dL — ABNORMAL LOW (ref 3.5–5.0)
BUN: 25.6 mg/dL (ref 7.0–26.0)
CO2: 28 mEq/L (ref 22–29)
Calcium: 9.1 mg/dL (ref 8.4–10.4)
Chloride: 102 mEq/L (ref 98–107)
Potassium: 4.6 mEq/L (ref 3.5–5.1)

## 2012-12-01 LAB — CBC WITH DIFFERENTIAL/PLATELET
BASO%: 0.8 % (ref 0.0–2.0)
Eosinophils Absolute: 0.4 10*3/uL (ref 0.0–0.5)
HCT: 32 % — ABNORMAL LOW (ref 38.4–49.9)
HGB: 10.2 g/dL — ABNORMAL LOW (ref 13.0–17.1)
MCHC: 31.9 g/dL — ABNORMAL LOW (ref 32.0–36.0)
MONO#: 1.3 10*3/uL — ABNORMAL HIGH (ref 0.1–0.9)
NEUT#: 3.5 10*3/uL (ref 1.5–6.5)
NEUT%: 53.3 % (ref 39.0–75.0)
WBC: 6.6 10*3/uL (ref 4.0–10.3)
lymph#: 1.4 10*3/uL (ref 0.9–3.3)

## 2012-12-01 NOTE — Patient Instructions (Addendum)
We discussed treatment options with FOLFIRI. First cycle next week.

## 2012-12-01 NOTE — Telephone Encounter (Signed)
Per staff phone call and POF I have schedueld appts.  [name]  

## 2012-12-01 NOTE — Telephone Encounter (Signed)
Order for weekly CBC/ CMET faxed to Dr Hamrick's office [phone].

## 2012-12-01 NOTE — Telephone Encounter (Signed)
Gave pt appt for lab, MD , D/c pump and chemo. Called Dr. Burnell Blanks office for labs for pt's off chemo week, Laney Pastor will fax prescription to [facility] for the lab order, pt aware to call to verify appts time

## 2012-12-01 NOTE — Progress Notes (Signed)
[facility] Telephone:(336) [phone]   Fax:(336) [phone]  OFFICE PROGRESS NOTE  [name], MD Dr. Sharman Crate Hamrick [address] [place] [address]  DIAGNOSIS: Metastatic pancreatic adenocarcinoma, now with metastatic disease to the liver and the abdominal wall, initially diagnosed with stage IIIB(T3, N1, M0) pancreatic tail adenocarcinoma in November of 2011.   PRIOR THERAPY:  #1 distal pancreatectomy and splenectomy on 06/25/2010  #2 adjuvant chemotherapy with gemcitabine started 08/20/2010 and completed 2 cycles on 12/02/2010.  #3 status post concurrent chemoradiation with Xeloda from 12/15/2010 on 10 01/24/2011.  #4 status post 2 more cycles of adjuvant gemcitabine with gemcitabine completed on 04/04/2011.  #5 FOLFOX giving every 2 weeks status post 12 cycles.  #6 Continuous infusion 5-FU and leucovorin every 2 weeks. Status post 4 cycles discontinued today secondary to disease progression.  #7 The patient was started on 05/03/2012 the first cycle of systemic chemotherapy with gemcitabine 800 mg/M2 in addition to Abraxane 80 mg/M2 on weekly basis for 3 out of every 4 weeks. Status post 6 cycles, discontinued today secondary to disease progression.   CURRENT THERAPY: FOLFIRI (5FU, Leucovorin and Irinotecan) every 2 weeks. First cycle next week.  INTERVAL HISTORY: Philip Shaw 78 y.o. male returns to the clinic today for followup visit accompanied by his wife. The patient is doing fine today with no specific complaints. He denied having any significant fatigue or weakness. He denied having any chest pain, shortness of breath, cough or hemoptysis. He has no nausea or vomiting but continues to have occasional pain at the right upper quadrant of the abdomen especially with deep breath. He was seen by Dr. Olga Millers at [facility] for second opinion regarding his progressive pancreatic adenocarcinoma. Dr. Olga Millers agreed with my recommendation of treating the  patient with FOLFIRI. He may also consider him for a clinical trial in the future if he progressed on the FOLFIRI. The patient is here today for reevaluation and consideration of starting the treatment.   MEDICAL HISTORY: Past Medical History  Diagnosis Date  . Heart murmur   . Abdominal pain, periumbilical   . Constipation   . Diabetes mellitus   . Hypertension   . Sleep apnea     pt states he no longer uses his cpap - lost weight  . pancreatic ca dx'd 04/2010    METASTATIC PANCREATIC CA WITH METS TO LIVER AND ABDOMINAL WALL  -chemo every 2 weeks - ONCOLOGIST DR. Aracelly Tencza WITH [facility].  Marland Kitchen Port-a-cath in place     RIGHT UPPER CHEST - FOR CHEMO EVERY 2 WEEKS    ALLERGIES:  has No Known Allergies.  MEDICATIONS:  Current Outpatient Prescriptions  Medication Sig Dispense Refill  . Alum & Mag Hydroxide-Simeth (MAGIC MOUTHWASH) SOLN Take 5 mLs by mouth QID.  240 mL  0  . aspirin 81 MG tablet Take 81 mg by mouth every morning.       . gabapentin (NEURONTIN) 100 MG capsule Take 1 capsule (100 mg total) by mouth 3 (three) times daily.  270 capsule  1  . insulin glargine (LANTUS) 100 UNIT/ML injection Inject into the skin at bedtime. 24 units nightly      . lidocaine-prilocaine (EMLA) cream Apply 1 application topically as needed.  30 g  2  . mirtazapine (REMERON) 30 MG tablet Take 1 tablet (30 mg total) by mouth at bedtime.  90 tablet  0  . ondansetron (ZOFRAN) 8 MG tablet Take 1 tablet (8  mg total) by mouth every 12 (twelve) hours as needed for nausea.  60 tablet  2  . oxyCODONE (OXY IR/ROXICODONE) 5 MG immediate release tablet Take 1 tablet (5 mg total) by mouth every 4 (four) hours as needed.  30 tablet  0  . oxyCODONE (OXYCONTIN) 10 MG 12 hr tablet Take 1 tablet (10 mg total) by mouth every 12 (twelve) hours.  60 tablet  0  . ranitidine (ZANTAC) 150 MG tablet Take 150 mg by mouth 2 (two) times daily.       . Tamsulosin HCl (FLOMAX) 0.4 MG CAPS Once or twice daily       . terazosin (HYTRIN) 2 MG capsule Take 2 mg by mouth 2 (two) times daily.       . [DISCONTINUED] metFORMIN (GLUCOPHAGE) 850 MG tablet Take 850 mg by mouth 2 (two) times daily with a meal.        No current facility-administered medications for this visit.   Facility-Administered Medications Ordered in Other Visits  Medication Dose Route Frequency Provider Last Rate Last Dose  . sodium chloride 0.9 % injection 10 mL  10 mL Intracatheter PRN Si Gaul, MD   10 mL at 07/26/12 1715  . sodium chloride 0.9 % injection 10 mL  10 mL Intracatheter PRN Conni Slipper, PA-C   10 mL at 09/20/12 1736    SURGICAL HISTORY:  Past Surgical History  Procedure Laterality Date  . Appendectomy  27-68 years old  . Pancreatectomy  06/2010    with splenectomy  . Inguinal hernia repair  as infant, age 55    LIH- infant, RIH age 33  . Knee arthroscopy  04/23/2012    Procedure: ARTHROSCOPY KNEE;  Surgeon: Loanne Drilling, MD;  Location: [facility];  Service: Orthopedics;  Laterality: Right;  with Debridement    REVIEW OF SYSTEMS:  A comprehensive review of systems was negative except for: Gastrointestinal: positive for abdominal pain   PHYSICAL EXAMINATION: General appearance: alert, cooperative and no distress Head: Normocephalic, without obvious abnormality, atraumatic Neck: no adenopathy Lymph nodes: Cervical, supraclavicular, and axillary nodes normal. Resp: clear to auscultation bilaterally Cardio: regular rate and rhythm, S1, S2 normal, no murmur, click, rub or gallop GI: Right upper quadrant abdominal pain only with deep breath Extremities: extremities normal, atraumatic, no cyanosis or edema Neurologic: Alert and oriented X 3, normal strength and tone. Normal symmetric reflexes. Normal coordination and gait  ECOG PERFORMANCE STATUS: 1 - Symptomatic but completely ambulatory  Blood pressure 109/64, pulse 79, temperature 97.3 F (36.3 C), temperature source Oral, resp. rate 20, height 5\' 7"   (1.702 m), weight 161 lb 3.2 oz (73.12 kg).  LABORATORY DATA: Lab Results  Component Value Date   WBC 6.6 12/01/2012   HGB 10.2* 12/01/2012   HCT 32.0* 12/01/2012   MCV 89.9 12/01/2012   PLT 332 12/01/2012      Chemistry      Component Value Date/Time   NA 135* 11/01/2012 1226   NA 136 04/19/2012 0930   K 4.6 11/01/2012 1226   K 4.3 04/19/2012 0930   CL 101 11/01/2012 1226   CL 100 04/19/2012 0930   CO2 28 11/01/2012 1226   CO2 28 04/19/2012 0930   BUN 26.6* 11/01/2012 1226   BUN 18 04/19/2012 0930   CREATININE 0.8 11/01/2012 1226   CREATININE 0.76 04/19/2012 0930      Component Value Date/Time   CALCIUM 8.8 11/01/2012 1226   CALCIUM 9.2 04/19/2012 0930   ALKPHOS 314* 11/01/2012 1226  ALKPHOS 249* 04/19/2012 0930   AST 33 11/01/2012 1226   AST 45* 04/19/2012 0930   ALT 49 11/01/2012 1226   ALT 42 04/19/2012 0930   BILITOT 0.42 11/01/2012 1226   BILITOT 0.6 04/19/2012 0930       ASSESSMENT: This is a very pleasant 78 years old white male with metastatic pancreatic adenocarcinoma diagnosed in November of 2011 is status post several chemotherapy regimens, most recently with gemcitabine and Abraxane discontinued secondary to disease progression.   PLAN: I have a lengthy discussion with the patient and his wife today about his condition. The patient would like to proceed with chemotherapy as previously recommended with FOLFIRI. He is expected to start the first cycle of this treatment next week. I discussed with the patient adverse effect of this treatment including but not limited to alopecia, myelosuppression, nausea and vomiting, peripheral neuropathy, liver or in dysfunction as well as diarrhea. He would come back for followup visit in 2 weeks for evaluation and management any adverse effects of his treatment. He was advised to call immediately if he has any concerning symptoms in the interval. All questions were answered. The patient knows to call the clinic with any problems, questions or concerns.  We can certainly see the patient much sooner if necessary.  I spent 20 minutes counseling the patient face to face. The total time spent in the appointment was 30 minutes.

## 2012-12-06 ENCOUNTER — Other Ambulatory Visit (HOSPITAL_BASED_OUTPATIENT_CLINIC_OR_DEPARTMENT_OTHER): Payer: Medicare Other | Admitting: Lab

## 2012-12-06 ENCOUNTER — Ambulatory Visit (HOSPITAL_BASED_OUTPATIENT_CLINIC_OR_DEPARTMENT_OTHER): Payer: Medicare Other

## 2012-12-06 VITALS — BP 112/67 | HR 87 | Temp 98.1°F | Resp 18

## 2012-12-06 DIAGNOSIS — C779 Secondary and unspecified malignant neoplasm of lymph node, unspecified: Secondary | ICD-10-CM

## 2012-12-06 DIAGNOSIS — Z5111 Encounter for antineoplastic chemotherapy: Secondary | ICD-10-CM

## 2012-12-06 DIAGNOSIS — C787 Secondary malignant neoplasm of liver and intrahepatic bile duct: Secondary | ICD-10-CM

## 2012-12-06 DIAGNOSIS — C50919 Malignant neoplasm of unspecified site of unspecified female breast: Secondary | ICD-10-CM

## 2012-12-06 DIAGNOSIS — C252 Malignant neoplasm of tail of pancreas: Secondary | ICD-10-CM

## 2012-12-06 DIAGNOSIS — C259 Malignant neoplasm of pancreas, unspecified: Secondary | ICD-10-CM

## 2012-12-06 LAB — CBC WITH DIFFERENTIAL/PLATELET
BASO%: 0.5 % (ref 0.0–2.0)
EOS%: 3.1 % (ref 0.0–7.0)
HGB: 10.4 g/dL — ABNORMAL LOW (ref 13.0–17.1)
MCH: 28.9 pg (ref 27.2–33.4)
MCHC: 32.3 g/dL (ref 32.0–36.0)
RDW: 21.1 % — ABNORMAL HIGH (ref 11.0–14.6)
WBC: 8.1 10*3/uL (ref 4.0–10.3)
lymph#: 1.2 10*3/uL (ref 0.9–3.3)

## 2012-12-06 LAB — COMPREHENSIVE METABOLIC PANEL (CC13)
ALT: 21 U/L (ref 0–55)
AST: 25 U/L (ref 5–34)
Albumin: 2.9 g/dL — ABNORMAL LOW (ref 3.5–5.0)
Alkaline Phosphatase: 201 U/L — ABNORMAL HIGH (ref 40–150)
Potassium: 4.2 mEq/L (ref 3.5–5.1)
Sodium: 136 mEq/L (ref 136–145)
Total Protein: 6.8 g/dL (ref 6.4–8.3)

## 2012-12-06 MED ORDER — DEXAMETHASONE SODIUM PHOSPHATE 4 MG/ML IJ SOLN
20.0000 mg | Freq: Once | INTRAMUSCULAR | Status: AC
  Administered 2012-12-06: 20 mg via INTRAVENOUS

## 2012-12-06 MED ORDER — FLUOROURACIL CHEMO INJECTION 2.5 GM/50ML
400.0000 mg/m2 | Freq: Once | INTRAVENOUS | Status: AC
  Administered 2012-12-06: 750 mg via INTRAVENOUS
  Filled 2012-12-06: qty 15

## 2012-12-06 MED ORDER — ONDANSETRON 16 MG/50ML IVPB (CHCC)
16.0000 mg | Freq: Once | INTRAVENOUS | Status: AC
  Administered 2012-12-06: 16 mg via INTRAVENOUS

## 2012-12-06 MED ORDER — SODIUM CHLORIDE 0.9 % IV SOLN
Freq: Once | INTRAVENOUS | Status: AC
  Administered 2012-12-06: 13:00:00 via INTRAVENOUS

## 2012-12-06 MED ORDER — SODIUM CHLORIDE 0.9 % IV SOLN
2400.0000 mg/m2 | INTRAVENOUS | Status: DC
  Administered 2012-12-06: 4450 mg via INTRAVENOUS
  Filled 2012-12-06: qty 89

## 2012-12-06 MED ORDER — IRINOTECAN HCL CHEMO INJECTION 100 MG/5ML
183.0000 mg/m2 | Freq: Once | INTRAVENOUS | Status: AC
  Administered 2012-12-06: 340 mg via INTRAVENOUS
  Filled 2012-12-06: qty 17

## 2012-12-06 MED ORDER — LEUCOVORIN CALCIUM INJECTION 350 MG
398.0000 mg/m2 | Freq: Once | INTRAVENOUS | Status: AC
  Administered 2012-12-06: 740 mg via INTRAVENOUS
  Filled 2012-12-06: qty 37

## 2012-12-06 NOTE — Patient Instructions (Addendum)
Leucovorin injection What is this medicine? LEUCOVORIN (loo koe VOR in) is used to prevent or treat the harmful effects of some medicines. This medicine is used to treat anemia caused by a low amount of folic acid in the body. It is also used with 5-fluorouracil (5-FU) to treat colon cancer. This medicine may be used for other purposes; ask your health care provider or pharmacist if you have questions. What should I tell my health care provider before I take this medicine? They need to know if you have any of these conditions: -anemia from low levels of vitamin B-12 in the blood -an unusual or allergic reaction to leucovorin, folic acid, other medicines, foods, dyes, or preservatives -pregnant or trying to get pregnant -breast-feeding How should I use this medicine? This medicine is for injection into a muscle or into a vein. It is given by a health care professional in a hospital or clinic setting. Talk to your pediatrician regarding the use of this medicine in children. Special care may be needed. Overdosage: If you think you have taken too much of this medicine contact a poison control center or emergency room at once. NOTE: This medicine is only for you. Do not share this medicine with others. What if I miss a dose? This does not apply. What may interact with this medicine? -capecitabine -fluorouracil -phenobarbital -phenytoin -primidone -trimethoprim-sulfamethoxazole This list may not describe all possible interactions. Give your health care provider a list of all the medicines, herbs, non-prescription drugs, or dietary supplements you use. Also tell them if you smoke, drink alcohol, or use illegal drugs. Some items may interact with your medicine. What should I watch for while using this medicine? Your condition will be monitored carefully while you are receiving this medicine. This medicine may increase the side effects of 5-fluorouracil, 5-FU. Tell your doctor or health care  professional if you have diarrhea or mouth sores that do not get better or that get worse. What side effects may I notice from receiving this medicine? Side effects that you should report to your doctor or health care professional as soon as possible: -allergic reactions like skin rash, itching or hives, swelling of the face, lips, or tongue -breathing problems -fever, infection -mouth sores -unusual bleeding or bruising -unusually weak or tired Side effects that usually do not require medical attention (report to your doctor or health care professional if they continue or are bothersome): -constipation or diarrhea -loss of appetite -nausea, vomiting This list may not describe all possible side effects. Call your doctor for medical advice about side effects. You may report side effects to FDA at 1-800-FDA-1088. Where should I keep my medicine? This drug is given in a hospital or clinic and will not be stored at home. NOTE: This sheet is a summary. It may not cover all possible information. If you have questions about this medicine, talk to your doctor, pharmacist, or health care provider.  2012, Elsevier/Gold Standard. (02/15/2008 4:50:29 PM)Irinotecan injection What is this medicine? IRINOTECAN (ir in oh TEE kan ) is a chemotherapy drug. It is used to treat colon and rectal cancer. This medicine may be used for other purposes; ask your health care provider or pharmacist if you have questions. What should I tell my health care provider before I take this medicine? They need to know if you have any of these conditions: -blood disorders -dehydration -diarrhea -infection (especially a virus infection such as chickenpox, cold sores, or herpes) -liver disease -low blood counts, like low white cell,   platelet, or red cell counts -recent or ongoing radiation therapy -an unusual or allergic reaction to irinotecan, sorbitol, other chemotherapy, other medicines, foods, dyes, or  preservatives -pregnant or trying to get pregnant -breast-feeding How should I use this medicine? This drug is given as an infusion into a vein. It is administered in a hospital or clinic by a specially trained health care professional. Talk to your pediatrician regarding the use of this medicine in children. Special care may be needed. Overdosage: If you think you have taken too much of this medicine contact a poison control center or emergency room at once. NOTE: This medicine is only for you. Do not share this medicine with others. What if I miss a dose? It is important not to miss your dose. Call your doctor or health care professional if you are unable to keep an appointment. What may interact with this medicine? Do not take this medicine with any of the following medications: -atazanavir -ketoconazole -St. John's Wort This medicine may also interact with the following medications: -dexamethasone -diuretics -laxatives -medicines for seizures like carbamazepine, mephobarbital, phenobarbital, phenytoin, primidone -medicines to increase blood counts like filgrastim, pegfilgrastim, sargramostim -prochlorperazine -vaccines This list may not describe all possible interactions. Give your health care provider a list of all the medicines, herbs, non-prescription drugs, or dietary supplements you use. Also tell them if you smoke, drink alcohol, or use illegal drugs. Some items may interact with your medicine. What should I watch for while using this medicine? Your condition will be monitored carefully while you are receiving this medicine. You will need important blood work done while you are taking this medicine. This drug may make you feel generally unwell. This is not uncommon, as chemotherapy can affect healthy cells as well as cancer cells. Report any side effects. Continue your course of treatment even though you feel ill unless your doctor tells you to stop. In some cases, you may be  given additional medicines to help with side effects. Follow all directions for their use. You may get drowsy or dizzy. Do not drive, use machinery, or do anything that needs mental alertness until you know how this medicine affects you. Do not stand or sit up quickly, especially if you are an older patient. This reduces the risk of dizzy or fainting spells. Call your doctor or health care professional for advice if you get a fever, chills or sore throat, or other symptoms of a cold or flu. Do not treat yourself. This drug decreases your body's ability to fight infections. Try to avoid being around people who are sick. This medicine may increase your risk to bruise or bleed. Call your doctor or health care professional if you notice any unusual bleeding. Be careful brushing and flossing your teeth or using a toothpick because you may get an infection or bleed more easily. If you have any dental work done, tell your dentist you are receiving this medicine. Avoid taking products that contain aspirin, acetaminophen, ibuprofen, naproxen, or ketoprofen unless instructed by your doctor. These medicines may hide a fever. Do not become pregnant while taking this medicine. Women should inform their doctor if they wish to become pregnant or think they might be pregnant. There is a potential for serious side effects to an unborn child. Talk to your health care professional or pharmacist for more information. Do not breast-feed an infant while taking this medicine. What side effects may I notice from receiving this medicine? Side effects that you should report to your doctor   or health care professional as soon as possible: -allergic reactions like skin rash, itching or hives, swelling of the face, lips, or tongue -low blood counts - this medicine may decrease the number of white blood cells, red blood cells and platelets. You may be at increased risk for infections and bleeding. -signs of infection - fever or chills,  cough, sore throat, pain or difficulty passing urine -signs of decreased platelets or bleeding - bruising, pinpoint red spots on the skin, black, tarry stools, blood in the urine -signs of decreased red blood cells - unusually weak or tired, fainting spells, lightheadedness -breathing problems -chest pain -diarrhea -feeling faint or lightheaded, falls -flushing, runny nose, sweating during infusion -mouth sores or pain -pain, swelling, redness or irritation where injected -pain, swelling, warmth in the leg -pain, tingling, numbness in the hands or feet -problems with balance, talking, walking -stomach cramps, pain -trouble passing urine or change in the amount of urine -vomiting as to be unable to hold down drinks or food -yellowing of the eyes or skin Side effects that usually do not require medical attention (report to your doctor or health care professional if they continue or are bothersome): -constipation -hair loss -headache -loss of appetite -nausea, vomiting -stomach upset This list may not describe all possible side effects. Call your doctor for medical advice about side effects. You may report side effects to FDA at 1-800-FDA-1088. Where should I keep my medicine? This drug is given in a hospital or clinic and will not be stored at home. NOTE: This sheet is a summary. It may not cover all possible information. If you have questions about this medicine, talk to your doctor, pharmacist, or health care provider.  2013, Elsevier/Gold Standard. (12/28/2007 4:29:12 PM) Fluorouracil, 5-FU injection What is this medicine? FLUOROURACIL, 5-FU (flure oh YOOR a sil) is a chemotherapy drug. It slows the growth of cancer cells. This medicine is used to treat many types of cancer like breast cancer, colon or rectal cancer, pancreatic cancer, and stomach cancer. This medicine may be used for other purposes; ask your health care provider or pharmacist if you have questions. What should I  tell my health care provider before I take this medicine? They need to know if you have any of these conditions: -blood disorders -dihydropyrimidine dehydrogenase (DPD) deficiency -infection (especially a virus infection such as chickenpox, cold sores, or herpes) -kidney disease -liver disease -malnourished, poor nutrition -recent or ongoing radiation therapy -an unusual or allergic reaction to fluorouracil, other chemotherapy, other medicines, foods, dyes, or preservatives -pregnant or trying to get pregnant -breast-feeding How should I use this medicine? This drug is given as an infusion or injection into a vein. It is administered in a hospital or clinic by a specially trained health care professional. Talk to your pediatrician regarding the use of this medicine in children. Special care may be needed. Overdosage: If you think you have taken too much of this medicine contact a poison control center or emergency room at once. NOTE: This medicine is only for you. Do not share this medicine with others. What if I miss a dose? It is important not to miss your dose. Call your doctor or health care professional if you are unable to keep an appointment. What may interact with this medicine? -allopurinol -cimetidine -dapsone -digoxin -hydroxyurea -leucovorin -levamisole -medicines for seizures like ethotoin, fosphenytoin, phenytoin -medicines to increase blood counts like filgrastim, pegfilgrastim, sargramostim -medicines that treat or prevent blood clots like warfarin, enoxaparin, and dalteparin -methotrexate -metronidazole -pyrimethamine -  some other chemotherapy drugs like busulfan, cisplatin, estramustine, vinblastine -trimethoprim -trimetrexate -vaccines Talk to your doctor or health care professional before taking any of these medicines: -acetaminophen -aspirin -ibuprofen -ketoprofen -naproxen This list may not describe all possible interactions. Give your health care  provider a list of all the medicines, herbs, non-prescription drugs, or dietary supplements you use. Also tell them if you smoke, drink alcohol, or use illegal drugs. Some items may interact with your medicine. What should I watch for while using this medicine? Visit your doctor for checks on your progress. This drug may make you feel generally unwell. This is not uncommon, as chemotherapy can affect healthy cells as well as cancer cells. Report any side effects. Continue your course of treatment even though you feel ill unless your doctor tells you to stop. In some cases, you may be given additional medicines to help with side effects. Follow all directions for their use. Call your doctor or health care professional for advice if you get a fever, chills or sore throat, or other symptoms of a cold or flu. Do not treat yourself. This drug decreases your body's ability to fight infections. Try to avoid being around people who are sick. This medicine may increase your risk to bruise or bleed. Call your doctor or health care professional if you notice any unusual bleeding. Be careful brushing and flossing your teeth or using a toothpick because you may get an infection or bleed more easily. If you have any dental work done, tell your dentist you are receiving this medicine. Avoid taking products that contain aspirin, acetaminophen, ibuprofen, naproxen, or ketoprofen unless instructed by your doctor. These medicines may hide a fever. Do not become pregnant while taking this medicine. Women should inform their doctor if they wish to become pregnant or think they might be pregnant. There is a potential for serious side effects to an unborn child. Talk to your health care professional or pharmacist for more information. Do not breast-feed an infant while taking this medicine. Men should inform their doctor if they wish to father a child. This medicine may lower sperm counts. Do not treat diarrhea with over the  counter products. Contact your doctor if you have diarrhea that lasts more than 2 days or if it is severe and watery. This medicine can make you more sensitive to the sun. Keep out of the sun. If you cannot avoid being in the sun, wear protective clothing and use sunscreen. Do not use sun lamps or tanning beds/booths. What side effects may I notice from receiving this medicine? Side effects that you should report to your doctor or health care professional as soon as possible: -allergic reactions like skin rash, itching or hives, swelling of the face, lips, or tongue -low blood counts - this medicine may decrease the number of white blood cells, red blood cells and platelets. You may be at increased risk for infections and bleeding. -signs of infection - fever or chills, cough, sore throat, pain or difficulty passing urine -signs of decreased platelets or bleeding - bruising, pinpoint red spots on the skin, black, tarry stools, blood in the urine -signs of decreased red blood cells - unusually weak or tired, fainting spells, lightheadedness -breathing problems -changes in vision -chest pain -mouth sores -nausea and vomiting -pain, swelling, redness at site where injected -pain, tingling, numbness in the hands or feet -redness, swelling, or sores on hands or feet -stomach pain -unusual bleeding Side effects that usually do not require medical attention (report   to your doctor or health care professional if they continue or are bothersome): -changes in finger or toe nails -diarrhea -dry or itchy skin -hair loss -headache -loss of appetite -sensitivity of eyes to the light -stomach upset -unusually teary eyes This list may not describe all possible side effects. Call your doctor for medical advice about side effects. You may report side effects to FDA at 1-800-FDA-1088. Where should I keep my medicine? This drug is given in a hospital or clinic and will not be stored at home. NOTE: This  sheet is a summary. It may not cover all possible information. If you have questions about this medicine, talk to your doctor, pharmacist, or health care provider.  2013, Elsevier/Gold Standard. (12/15/2007 1:53:16 PM)  

## 2012-12-08 ENCOUNTER — Other Ambulatory Visit: Payer: Medicare Other | Admitting: Lab

## 2012-12-08 ENCOUNTER — Ambulatory Visit (HOSPITAL_BASED_OUTPATIENT_CLINIC_OR_DEPARTMENT_OTHER): Payer: Medicare Other

## 2012-12-08 VITALS — BP 126/66 | HR 81 | Temp 97.9°F | Resp 20

## 2012-12-08 DIAGNOSIS — C259 Malignant neoplasm of pancreas, unspecified: Secondary | ICD-10-CM

## 2012-12-08 MED ORDER — SODIUM CHLORIDE 0.9 % IJ SOLN
10.0000 mL | INTRAMUSCULAR | Status: DC | PRN
  Administered 2012-12-08: 10 mL
  Filled 2012-12-08: qty 10

## 2012-12-08 MED ORDER — HEPARIN SOD (PORK) LOCK FLUSH 100 UNIT/ML IV SOLN
500.0000 [IU] | Freq: Once | INTRAVENOUS | Status: AC | PRN
  Administered 2012-12-08: 500 [IU]
  Filled 2012-12-08: qty 5

## 2012-12-09 ENCOUNTER — Other Ambulatory Visit: Payer: Self-pay | Admitting: Certified Registered Nurse Anesthetist

## 2012-12-10 ENCOUNTER — Other Ambulatory Visit: Payer: Self-pay | Admitting: Internal Medicine

## 2012-12-15 ENCOUNTER — Other Ambulatory Visit: Payer: Medicare Other | Admitting: Lab

## 2012-12-15 ENCOUNTER — Telehealth: Payer: Self-pay | Admitting: *Deleted

## 2012-12-15 NOTE — Telephone Encounter (Signed)
CBC/ CMET dated 12/13/12 from [facility] in [place] given to Dr Donnald Garre to review.  SLJ

## 2012-12-20 ENCOUNTER — Ambulatory Visit (HOSPITAL_BASED_OUTPATIENT_CLINIC_OR_DEPARTMENT_OTHER): Payer: Medicare Other | Admitting: Internal Medicine

## 2012-12-20 ENCOUNTER — Other Ambulatory Visit (HOSPITAL_BASED_OUTPATIENT_CLINIC_OR_DEPARTMENT_OTHER): Payer: Medicare Other | Admitting: Lab

## 2012-12-20 ENCOUNTER — Encounter: Payer: Self-pay | Admitting: Internal Medicine

## 2012-12-20 ENCOUNTER — Other Ambulatory Visit: Payer: Self-pay | Admitting: Medical Oncology

## 2012-12-20 ENCOUNTER — Inpatient Hospital Stay: Payer: Medicare Other

## 2012-12-20 VITALS — BP 114/68 | HR 82 | Temp 97.1°F | Resp 20 | Ht 67.0 in | Wt 159.3 lb

## 2012-12-20 DIAGNOSIS — C252 Malignant neoplasm of tail of pancreas: Secondary | ICD-10-CM

## 2012-12-20 DIAGNOSIS — C259 Malignant neoplasm of pancreas, unspecified: Secondary | ICD-10-CM

## 2012-12-20 DIAGNOSIS — D72819 Decreased white blood cell count, unspecified: Secondary | ICD-10-CM

## 2012-12-20 DIAGNOSIS — D709 Neutropenia, unspecified: Secondary | ICD-10-CM

## 2012-12-20 DIAGNOSIS — C787 Secondary malignant neoplasm of liver and intrahepatic bile duct: Secondary | ICD-10-CM

## 2012-12-20 DIAGNOSIS — R52 Pain, unspecified: Secondary | ICD-10-CM

## 2012-12-20 LAB — CBC WITH DIFFERENTIAL/PLATELET
Basophils Absolute: 0 10*3/uL (ref 0.0–0.1)
EOS%: 6 % (ref 0.0–7.0)
HGB: 9.2 g/dL — ABNORMAL LOW (ref 13.0–17.1)
MCH: 28.5 pg (ref 27.2–33.4)
MCV: 88.5 fL (ref 79.3–98.0)
MONO%: 20.5 % — ABNORMAL HIGH (ref 0.0–14.0)
RBC: 3.23 10*6/uL — ABNORMAL LOW (ref 4.20–5.82)
RDW: 19.5 % — ABNORMAL HIGH (ref 11.0–14.6)

## 2012-12-20 MED ORDER — OXYCODONE HCL 10 MG PO TB12: 10.0000 mg | ORAL_TABLET | Freq: Two times a day (BID) | ORAL | Status: DC

## 2012-12-20 MED ORDER — FILGRASTIM 300 MCG/0.5ML IJ SOLN
300.0000 ug | Freq: Once | INTRAMUSCULAR | Status: AC
  Administered 2012-12-20: 300 ug via SUBCUTANEOUS
  Filled 2012-12-20: qty 0.5

## 2012-12-20 NOTE — Progress Notes (Signed)
[facility] [phone]   [phone]  OFFICE PROGRESS NOTE  Philip Ravel, MD Dr. Sharman Crate Hamrick [address] [place] [address]  DIAGNOSIS: Metastatic pancreatic adenocarcinoma, now with metastatic disease to the liver and the abdominal wall, initially diagnosed with stage IIIB(T3, N1, M0) pancreatic tail adenocarcinoma in November of 2011.   PRIOR THERAPY:  #1 distal pancreatectomy and splenectomy on 06/25/2010  #2 adjuvant chemotherapy with gemcitabine started 08/20/2010 and completed 2 cycles on 12/02/2010.  #3 status post concurrent chemoradiation with Xeloda from 12/15/2010 on 10 01/24/2011.  #4 status post 2 more cycles of adjuvant gemcitabine with gemcitabine completed on 04/04/2011.  #5 FOLFOX giving every 2 weeks status post 12 cycles.  #6 Continuous infusion 5-FU and leucovorin every 2 weeks. Status post 4 cycles discontinued today secondary to disease progression.  #7 The patient was started on 05/03/2012 the first cycle of systemic chemotherapy with gemcitabine 800 mg/M2 in addition to Abraxane 80 mg/M2 on weekly basis for 3 out of every 4 weeks. Status post 6 cycles, discontinued today secondary to disease progression.   CURRENT THERAPY: FOLFIRI (5FU, Leucovorin and Irinotecan) every 2 weeks. First cycle was given on 12/07/2002 .  INTERVAL HISTORY: Philip Shaw 78 y.o. male returns to the clinic today for followup visit accompanied his wife. The patient is feeling fine today with no specific complaints except for occasional pain at the right upper quadrant of the abdomen on deep breath. The patient denied having any significant nausea or vomiting. He denied having any significant weight loss or night sweats. He has no chest pain, shortness breath, cough or hemoptysis. He tolerated the first cycle of his treatment with chemotherapy with FOLFIRI fairly well with no significant adverse effects. He is here today to start cycle  #2.  MEDICAL HISTORY: Past Medical History  Diagnosis Date  . Heart murmur   . Abdominal pain, periumbilical   . Constipation   . Diabetes mellitus   . Hypertension   . Sleep apnea     pt states he no longer uses his cpap - lost weight  . pancreatic ca dx'd 04/2010    METASTATIC PANCREATIC CA WITH METS TO LIVER AND ABDOMINAL WALL  -chemo every 2 weeks - ONCOLOGIST DR. Shephanie [facility]  Marland Kitchen Port-a-cath in place     RIGHT UPPER CHEST - FOR CHEMO EVERY 2 WEEKS    ALLERGIES:  has No Known Allergies.  MEDICATIONS:  Current Outpatient Prescriptions  Medication Sig Dispense Refill  . Alum & Mag Hydroxide-Simeth (MAGIC MOUTHWASH) SOLN Take 5 mLs by mouth QID.  240 mL  0  . aspirin 81 MG tablet Take 81 mg by mouth every morning.       . gabapentin (NEURONTIN) 100 MG capsule Take 1 capsule (100 mg total) by mouth 3 (three) times daily.  270 capsule  1  . insulin glargine (LANTUS) 100 UNIT/ML injection Inject into the skin at bedtime. 24 units nightly      . mirtazapine (REMERON) 30 MG tablet TAKE 1 TABLET BY MOUTH EVERY NIGHT AT BEDTIME  90 tablet  0  . ondansetron (ZOFRAN) 8 MG tablet Take 1 tablet (8 mg total) by mouth every 12 (twelve) hours as needed for nausea.  60 tablet  2  . oxyCODONE (OXY IR/ROXICODONE) 5 MG immediate release tablet Take 1 tablet (5 mg total) by mouth every 4 (four) hours as needed.  30 tablet  0  . oxyCODONE (  OXYCONTIN) 10 MG 12 hr tablet Take 1 tablet (10 mg total) by mouth every 12 (twelve) hours.  60 tablet  0  . Tamsulosin HCl (FLOMAX) 0.4 MG CAPS Once or twice daily      . terazosin (HYTRIN) 2 MG capsule Take 2 mg by mouth 2 (two) times daily.       Marland Kitchen lidocaine-prilocaine (EMLA) cream Apply 1 application topically as needed.  30 g  2  . ranitidine (ZANTAC) 150 MG tablet Take 150 mg by mouth 2 (two) times daily.       . [DISCONTINUED] metFORMIN (GLUCOPHAGE) 850 MG tablet Take 850 mg by mouth 2 (two) times daily with a meal.         No current facility-administered medications for this visit.   Facility-Administered Medications Ordered in Other Visits  Medication Dose Route Frequency Provider Last Rate Last Dose  . sodium chloride 0.9 % injection 10 mL  10 mL Intracatheter PRN Si Gaul, MD   10 mL at 07/26/12 1715  . sodium chloride 0.9 % injection 10 mL  10 mL Intracatheter PRN Conni Slipper, PA-C   10 mL at 09/20/12 1736    SURGICAL HISTORY:  Past Surgical History  Procedure Laterality Date  . Appendectomy  75-53 years old  . Pancreatectomy  06/2010    with splenectomy  . Inguinal hernia repair  as infant, age 77    LIH- infant, RIH age 41  . Knee arthroscopy  04/23/2012    Procedure: ARTHROSCOPY KNEE;  Surgeon: Loanne Drilling, MD;  Location: [facility];  Service: Orthopedics;  Laterality: Right;  with Debridement    REVIEW OF SYSTEMS:  A comprehensive review of systems was negative except for: Gastrointestinal: positive for abdominal pain   PHYSICAL EXAMINATION: General appearance: alert, cooperative and no distress Head: Normocephalic, without obvious abnormality, atraumatic Neck: no adenopathy Lymph nodes: Cervical, supraclavicular, and axillary nodes normal. Resp: clear to auscultation bilaterally Cardio: regular rate and rhythm, S1, S2 normal, no murmur, click, rub or gallop GI: soft, non-tender; bowel sounds normal; no masses,  no organomegaly Extremities: extremities normal, atraumatic, no cyanosis or edema Neurologic: Alert and oriented X 3, normal strength and tone. Normal symmetric reflexes. Normal coordination and gait  ECOG PERFORMANCE STATUS: 1 - Symptomatic but completely ambulatory  Blood pressure 114/68, pulse 82, temperature 97.1 F (36.2 C), temperature source Oral, resp. rate 20, height 5\' 7"  (1.702 m), weight 159 lb 4.8 oz (72.258 kg).  LABORATORY DATA: Lab Results  Component Value Date   WBC 2.5* 12/20/2012   HGB 9.2* 12/20/2012   HCT 28.6* 12/20/2012   MCV 88.5 12/20/2012    PLT 339 12/20/2012      Chemistry      Component Value Date/Time   NA 136 12/06/2012 1128   NA 136 04/19/2012 0930   K 4.2 12/06/2012 1128   K 4.3 04/19/2012 0930   CL 102 12/06/2012 1128   CL 100 04/19/2012 0930   CO2 27 12/06/2012 1128   CO2 28 04/19/2012 0930   BUN 22.2 12/06/2012 1128   BUN 18 04/19/2012 0930   CREATININE 0.8 12/06/2012 1128   CREATININE 0.76 04/19/2012 0930      Component Value Date/Time   CALCIUM 8.7 12/06/2012 1128   CALCIUM 9.2 04/19/2012 0930   ALKPHOS 201* 12/06/2012 1128   ALKPHOS 249* 04/19/2012 0930   AST 25 12/06/2012 1128   AST 45* 04/19/2012 0930   ALT 21 12/06/2012 1128   ALT 42 04/19/2012 0930  BILITOT 0.57 12/06/2012 1128   BILITOT 0.6 04/19/2012 0930       RADIOGRAPHIC STUDIES: No results found.  ASSESSMENT: This is a very pleasant 78 years old white male with metastatic pancreatic adenocarcinoma currently on treatment with FOLFIRI status post 1 cycle complicated with leukopenia and neutropenia.    PLAN: I discussed the lab result with the patient today. I recommended for him to delay the treatment with FOLFIRI until next week. For the leukopenia and neutropenia, I will start the patient Neupogen 300 mcg subcutaneously for today and tomorrow. He would come back for followup visit in 3 weeks with the start of cycle #3. The patient was advised to call immediately if he has any concerning symptoms in the interval.  All questions were answered. The patient knows to call the clinic with any problems, questions or concerns. We can certainly see the patient much sooner if necessary.  I spent 15 minutes counseling the patient face to face. The total time spent in the appointment was 25 minutes.

## 2012-12-20 NOTE — Patient Instructions (Signed)
White blood count is low today. I will delay the start of cycle #2 of chemotherapy until you next week. Followup visit in 3 weeks

## 2012-12-21 ENCOUNTER — Ambulatory Visit (HOSPITAL_BASED_OUTPATIENT_CLINIC_OR_DEPARTMENT_OTHER): Payer: Medicare Other

## 2012-12-21 VITALS — BP 123/57 | HR 88 | Temp 97.9°F

## 2012-12-21 DIAGNOSIS — Z5189 Encounter for other specified aftercare: Secondary | ICD-10-CM

## 2012-12-21 DIAGNOSIS — C252 Malignant neoplasm of tail of pancreas: Secondary | ICD-10-CM

## 2012-12-21 DIAGNOSIS — C259 Malignant neoplasm of pancreas, unspecified: Secondary | ICD-10-CM

## 2012-12-21 MED ORDER — FILGRASTIM 300 MCG/0.5ML IJ SOLN
300.0000 ug | Freq: Once | INTRAMUSCULAR | Status: AC
  Administered 2012-12-21: 300 ug via SUBCUTANEOUS
  Filled 2012-12-21: qty 0.5

## 2012-12-21 NOTE — Patient Instructions (Addendum)
Filgrastim, G-CSF injection What is this medicine? FILGRASTIM, G-CSF (fil GRA stim) stimulates the formation of white blood cells. This medicine is given to patients with conditions that may cause a decrease in white blood cells, like those receiving certain types of chemotherapy or bone marrow transplant. It helps the bone marrow recover its ability to produce white blood cells. Increasing the amount of white blood cells helps to decrease the risk of infection and fever. This medicine may be used for other purposes; ask your health care provider or pharmacist if you have questions. What should I tell my health care provider before I take this medicine? They need to know if you have any of these conditions: -currently receiving radiation therapy -sickle cell disease -an unusual or allergic reaction to filgrastim, E. coli protein, other medicines, foods, dyes, or preservatives -pregnant or trying to get pregnant -breast-feeding How should I use this medicine? This medicine is for injection into a vein or injection under the skin. It is usually given by a health care professional in a hospital or clinic setting. If you get this medicine at home, you will be taught how to prepare and give this medicine. Always change the site for the injection under the skin. Let the solution warm to room temperature before you use it. Do not shake the solution before you withdraw a dose. Throw away any unused portion. Use exactly as directed. Take your medicine at regular intervals. Do not take your medicine more often than directed. It is important that you put your used needles and syringes in a special sharps container. Do not put them in a trash can. If you do not have a sharps container, call your pharmacist or healthcare provider to get one. Talk to your pediatrician regarding the use of this medicine in children. While this medicine may be prescribed for children for selected conditions, precautions do  apply. Overdosage: If you think you have taken too much of this medicine contact a poison control center or emergency room at once. NOTE: This medicine is only for you. Do not share this medicine with others. What if I miss a dose? Try not to miss doses. If you miss a dose take the dose as soon as you remember. If it is almost time for the next dose, do not take double doses unless told to by your doctor or health care professional. What may interact with this medicine? -lithium -medicines for cancer chemotherapy This list may not describe all possible interactions. Give your health care provider a list of all the medicines, herbs, non-prescription drugs, or dietary supplements you use. Also tell them if you smoke, drink alcohol, or use illegal drugs. Some items may interact with your medicine. What should I watch for while using this medicine? Visit your doctor or health care professional for regular checks on your progress. If you get a fever or any sign of infection while you are using this medicine, do not treat yourself. Check with your doctor or health care professional. Bone pain can usually be relieved by mild pain relievers such as acetaminophen or ibuprofen. Check with your doctor or health care professional before taking these medicines as they may hide a fever. Call your doctor or health care professional if the aches and pains are severe or do not go away. What side effects may I notice from receiving this medicine? Side effects that you should report to your doctor or health care professional as soon as possible: -allergic reactions like skin rash, itching   or hives, swelling of the face, lips, or tongue -difficulty breathing, wheezing -fever -pain, redness, or swelling at the injection site -stomach or side pain, or pain at the shoulder Side effects that usually do not require medical attention (report to your doctor or health care professional if they continue or are  bothersome): -bone pain (ribs, lower back, breast bone) -headache -skin rash This list may not describe all possible side effects. Call your doctor for medical advice about side effects. You may report side effects to FDA at 1-800-FDA-1088. Where should I keep my medicine? Keep out of the reach of children. Store in a refrigerator between 2 and 8 degrees C (36 and 46 degrees F). Do not freeze or leave in direct sunlight. If vials or syringes are left out of the refrigerator for more than 24 hours, they must be thrown away. Throw away unused vials after the expiration date on the carton. NOTE: This sheet is a summary. It may not cover all possible information. If you have questions about this medicine, talk to your doctor, pharmacist, or health care provider.  2013, [employer]. (10/27/2007 1:33:21 PM)  

## 2012-12-22 ENCOUNTER — Other Ambulatory Visit: Payer: Medicare Other | Admitting: Lab

## 2012-12-27 ENCOUNTER — Other Ambulatory Visit (HOSPITAL_BASED_OUTPATIENT_CLINIC_OR_DEPARTMENT_OTHER): Payer: Medicare Other | Admitting: Lab

## 2012-12-27 ENCOUNTER — Ambulatory Visit (HOSPITAL_BASED_OUTPATIENT_CLINIC_OR_DEPARTMENT_OTHER): Payer: Medicare Other

## 2012-12-27 VITALS — BP 120/63 | HR 81 | Temp 98.1°F | Resp 20

## 2012-12-27 DIAGNOSIS — C259 Malignant neoplasm of pancreas, unspecified: Secondary | ICD-10-CM

## 2012-12-27 DIAGNOSIS — Z5111 Encounter for antineoplastic chemotherapy: Secondary | ICD-10-CM

## 2012-12-27 DIAGNOSIS — C252 Malignant neoplasm of tail of pancreas: Secondary | ICD-10-CM

## 2012-12-27 DIAGNOSIS — C787 Secondary malignant neoplasm of liver and intrahepatic bile duct: Secondary | ICD-10-CM

## 2012-12-27 LAB — CBC WITH DIFFERENTIAL/PLATELET
BASO%: 0.4 % (ref 0.0–2.0)
EOS%: 1.6 % (ref 0.0–7.0)
Eosinophils Absolute: 0.2 10*3/uL (ref 0.0–0.5)
MCV: 88.4 fL (ref 79.3–98.0)
MONO%: 25.3 % — ABNORMAL HIGH (ref 0.0–14.0)
NEUT#: 6.9 10*3/uL — ABNORMAL HIGH (ref 1.5–6.5)
RBC: 3.61 10*6/uL — ABNORMAL LOW (ref 4.20–5.82)
RDW: 19.4 % — ABNORMAL HIGH (ref 11.0–14.6)
nRBC: 0 % (ref 0–0)

## 2012-12-27 LAB — COMPREHENSIVE METABOLIC PANEL (CC13)
ALT: 28 U/L (ref 0–55)
AST: 33 U/L (ref 5–34)
Alkaline Phosphatase: 212 U/L — ABNORMAL HIGH (ref 40–150)
CO2: 26 mEq/L (ref 22–29)
Sodium: 137 mEq/L (ref 136–145)
Total Bilirubin: 0.42 mg/dL (ref 0.20–1.20)
Total Protein: 7 g/dL (ref 6.4–8.3)

## 2012-12-27 MED ORDER — FLUOROURACIL CHEMO INJECTION 2.5 GM/50ML
400.0000 mg/m2 | Freq: Once | INTRAVENOUS | Status: AC
  Administered 2012-12-27: 750 mg via INTRAVENOUS
  Filled 2012-12-27: qty 15

## 2012-12-27 MED ORDER — DEXAMETHASONE SODIUM PHOSPHATE 20 MG/5ML IJ SOLN
20.0000 mg | Freq: Once | INTRAMUSCULAR | Status: AC
  Administered 2012-12-27: 20 mg via INTRAVENOUS

## 2012-12-27 MED ORDER — DEXTROSE 5 % IV SOLN
183.0000 mg/m2 | Freq: Once | INTRAVENOUS | Status: AC
  Administered 2012-12-27: 340 mg via INTRAVENOUS
  Filled 2012-12-27: qty 17

## 2012-12-27 MED ORDER — ONDANSETRON 16 MG/50ML IVPB (CHCC)
16.0000 mg | Freq: Once | INTRAVENOUS | Status: AC
  Administered 2012-12-27: 16 mg via INTRAVENOUS

## 2012-12-27 MED ORDER — ATROPINE SULFATE 0.4 MG/ML IJ SOLN
0.5000 mg | Freq: Once | INTRAMUSCULAR | Status: AC | PRN
  Administered 2012-12-27: 10:00:00 via INTRAVENOUS
  Filled 2012-12-27: qty 1.25

## 2012-12-27 MED ORDER — LEUCOVORIN CALCIUM INJECTION 350 MG
398.0000 mg/m2 | Freq: Once | INTRAMUSCULAR | Status: AC
  Administered 2012-12-27: 740 mg via INTRAVENOUS
  Filled 2012-12-27: qty 37

## 2012-12-27 MED ORDER — SODIUM CHLORIDE 0.9 % IJ SOLN
10.0000 mL | INTRAMUSCULAR | Status: DC | PRN
  Filled 2012-12-27: qty 10

## 2012-12-27 MED ORDER — SODIUM CHLORIDE 0.9 % IV SOLN
Freq: Once | INTRAVENOUS | Status: AC
  Administered 2012-12-27: 09:00:00 via INTRAVENOUS

## 2012-12-27 MED ORDER — HEPARIN SOD (PORK) LOCK FLUSH 100 UNIT/ML IV SOLN
500.0000 [IU] | Freq: Once | INTRAVENOUS | Status: DC | PRN
  Filled 2012-12-27: qty 5

## 2012-12-27 MED ORDER — SODIUM CHLORIDE 0.9 % IV SOLN
2400.0000 mg/m2 | INTRAVENOUS | Status: DC
  Administered 2012-12-27: 4450 mg via INTRAVENOUS
  Filled 2012-12-27: qty 89

## 2012-12-27 NOTE — Patient Instructions (Addendum)
CamFluorouracil, 5-FU injection What is this medicine? FLUOROURACIL, 5-FU (flure oh YOOR a sil) is a chemotherapy drug. It slows the growth of cancer cells. This medicine is used to treat many types of cancer like breast cancer, colon or rectal cancer, pancreatic cancer, and stomach cancer. This medicine may be used for other purposes; ask your health care provider or pharmacist if you have questions. What should I tell my health care provider before I take this medicine? They need to know if you have any of these conditions: -blood disorders -dihydropyrimidine dehydrogenase (DPD) deficiency -infection (especially a virus infection such as chickenpox, cold sores, or herpes) -kidney disease -liver disease -malnourished, poor nutrition -recent or ongoing radiation therapy -an unusual or allergic reaction to fluorouracil, other chemotherapy, other medicines, foods, dyes, or preservatives -pregnant or trying to get pregnant -breast-feeding How should I use this medicine? This drug is given as an infusion or injection into a vein. It is administered in a hospital or clinic by a specially trained health care professional. Talk to your pediatrician regarding the use of this medicine in children. Special care may be needed. Overdosage: If you think you have taken too much of this medicine contact a poison control center or emergency room at once. NOTE: This medicine is only for you. Do not share this medicine with others. What if I miss a dose? It is important not to miss your dose. Call your doctor or health care professional if you are unable to keep an appointment. What may interact with this medicine? -allopurinol -cimetidine -dapsone -digoxin -hydroxyurea -leucovorin -levamisole -medicines for seizures like ethotoin, fosphenytoin, phenytoin -medicines to increase blood counts like filgrastim, pegfilgrastim, sargramostim -medicines that treat or prevent blood clots like warfarin,  enoxaparin, and dalteparin -methotrexate -metronidazole -pyrimethamine -some other chemotherapy drugs like busulfan, cisplatin, estramustine, vinblastine -trimethoprim -trimetrexate -vaccines Talk to your doctor or health care professional before taking any of these medicines: -acetaminophen -aspirin -ibuprofen -ketoprofen -naproxen This list may not describe all possible interactions. Give your health care provider a list of all the medicines, herbs, non-prescription drugs, or dietary supplements you use. Also tell them if you smoke, drink alcohol, or use illegal drugs. Some items may interact with your medicine. What should I watch for while using this medicine? Visit your doctor for checks on your progress. This drug may make you feel generally unwell. This is not uncommon, as chemotherapy can affect healthy cells as well as cancer cells. Report any side effects. Continue your course of treatment even though you feel ill unless your doctor tells you to stop. In some cases, you may be given additional medicines to help with side effects. Follow all directions for their use. Call your doctor or health care professional for advice if you get a fever, chills or sore throat, or other symptoms of a cold or flu. Do not treat yourself. This drug decreases your body's ability to fight infections. Try to avoid being around people who are sick. This medicine may increase your risk to bruise or bleed. Call your doctor or health care professional if you notice any unusual bleeding. Be careful brushing and flossing your teeth or using a toothpick because you may get an infection or bleed more easily. If you have any dental work done, tell your dentist you are receiving this medicine. Avoid taking products that contain aspirin, acetaminophen, ibuprofen, naproxen, or ketoprofen unless instructed by your doctor. These medicines may hide a fever. Do not become pregnant while taking this medicine. Women should  inform their doctor if they wish to become pregnant or think they might be pregnant. There is a potential for serious side effects to an unborn child. Talk to your health care professional or pharmacist for more information. Do not breast-feed an infant while taking this medicine. Men should inform their doctor if they wish to father a child. This medicine may lower sperm counts. Do not treat diarrhea with over the counter products. Contact your doctor if you have diarrhea that lasts more than 2 days or if it is severe and watery. This medicine can make you more sensitive to the sun. Keep out of the sun. If you cannot avoid being in the sun, wear protective clothing and use sunscreen. Do not use sun lamps or tanning beds/booths. What side effects may I notice from receiving this medicine? Side effects that you should report to your doctor or health care professional as soon as possible: -allergic reactions like skin rash, itching or hives, swelling of the face, lips, or tongue -low blood counts - this medicine may decrease the number of white blood cells, red blood cells and platelets. You may be at increased risk for infections and bleeding. -signs of infection - fever or chills, cough, sore throat, pain or difficulty passing urine -signs of decreased platelets or bleeding - bruising, pinpoint red spots on the skin, black, tarry stools, blood in the urine -signs of decreased red blood cells - unusually weak or tired, fainting spells, lightheadedness -breathing problems -changes in vision -chest pain -mouth sores -nausea and vomiting -pain, swelling, redness at site where injected -pain, tingling, numbness in the hands or feet -redness, swelling, or sores on hands or feet -stomach pain -unusual bleeding Side effects that usually do not require medical attention (report to your doctor or health care professional if they continue or are bothersome): -changes in finger or toe  nails -diarrhea -dry or itchy skin -hair loss -headache -loss of appetite -sensitivity of eyes to the light -stomach upset -unusually teary eyes This list may not describe all possible side effects. Call your doctor for medical advice about side effects. You may report side effects to FDA at 1-800-FDA-1088. Where should I keep my medicine? This drug is given in a hospital or clinic and will not be stored at home. NOTE: This sheet is a summary. It may not cover all possible information. If you have questions about this medicine, talk to your doctor, pharmacist, or health care provider.  2013, [employer]. (12/15/2007 1:53:16 PM) Leucovorin injection What is this medicine? LEUCOVORIN (loo koe VOR in) is used to prevent or treat the harmful effects of some medicines. This medicine is used to treat anemia caused by a low amount of folic acid in the body. It is also used with 5-fluorouracil (5-FU) to treat colon cancer. This medicine may be used for other purposes; ask your health care provider or pharmacist if you have questions. What should I tell my health care provider before I take this medicine? They need to know if you have any of these conditions: -anemia from low levels of vitamin B-12 in the blood -an unusual or allergic reaction to leucovorin, folic acid, other medicines, foods, dyes, or preservatives -pregnant or trying to get pregnant -breast-feeding How should I use this medicine? This medicine is for injection into a muscle or into a vein. It is given by a health care professional in a hospital or clinic setting. Talk to your pediatrician regarding the use of this medicine in children. Special  care may be needed. Overdosage: If you think you have taken too much of this medicine contact a poison control center or emergency room at once. NOTE: This medicine is only for you. Do not share this medicine with others. What if I miss a dose? This does not apply. What may  interact with this medicine? -capecitabine -fluorouracil -phenobarbital -phenytoin -primidone -trimethoprim-sulfamethoxazole This list may not describe all possible interactions. Give your health care provider a list of all the medicines, herbs, non-prescription drugs, or dietary supplements you use. Also tell them if you smoke, drink alcohol, or use illegal drugs. Some items may interact with your medicine. What should I watch for while using this medicine? Your condition will be monitored carefully while you are receiving this medicine. This medicine may increase the side effects of 5-fluorouracil, 5-FU. Tell your doctor or health care professional if you have diarrhea or mouth sores that do not get better or that get worse. What side effects may I notice from receiving this medicine? Side effects that you should report to your doctor or health care professional as soon as possible: -allergic reactions like skin rash, itching or hives, swelling of the face, lips, or tongue -breathing problems -fever, infection -mouth sores -unusual bleeding or bruising -unusually weak or tired Side effects that usually do not require medical attention (report to your doctor or health care professional if they continue or are bothersome): -constipation or diarrhea -loss of appetite -nausea, vomiting This list may not describe all possible side effects. Call your doctor for medical advice about side effects. You may report side effects to FDA at 1-800-FDA-1088. Where should I keep my medicine? This drug is given in a hospital or clinic and will not be stored at home. NOTE: This sheet is a summary. It may not cover all possible information. If you have questions about this medicine, talk to your doctor, pharmacist, or health care provider.  2012, [employer]. (02/15/2008 4:50:29 PM)Irinotecan injection What is this medicine? IRINOTECAN (ir in oh TEE kan ) is a chemotherapy drug. It is used to  treat colon and rectal cancer. This medicine may be used for other purposes; ask your health care provider or pharmacist if you have questions. What should I tell my health care provider before I take this medicine? They need to know if you have any of these conditions: -blood disorders -dehydration -diarrhea -infection (especially a virus infection such as chickenpox, cold sores, or herpes) -liver disease -low blood counts, like low white cell, platelet, or red cell counts -recent or ongoing radiation therapy -an unusual or allergic reaction to irinotecan, sorbitol, other chemotherapy, other medicines, foods, dyes, or preservatives -pregnant or trying to get pregnant -breast-feeding How should I use this medicine? This drug is given as an infusion into a vein. It is administered in a hospital or clinic by a specially trained health care professional. Talk to your pediatrician regarding the use of this medicine in children. Special care may be needed. Overdosage: If you think you have taken too much of this medicine contact a poison control center or emergency room at once. NOTE: This medicine is only for you. Do not share this medicine with others. What if I miss a dose? It is important not to miss your dose. Call your doctor or health care professional if you are unable to keep an appointment. What may interact with this medicine? Do not take this medicine with any of the following medications: -atazanavir -ketoconazole -St. John's Wort This medicine may  also interact with the following medications: -dexamethasone -diuretics -laxatives -medicines for seizures like carbamazepine, mephobarbital, phenobarbital, phenytoin, primidone -medicines to increase blood counts like filgrastim, pegfilgrastim, sargramostim -prochlorperazine -vaccines This list may not describe all possible interactions. Give your health care provider a list of all the medicines, herbs, non-prescription drugs, or  dietary supplements you use. Also tell them if you smoke, drink alcohol, or use illegal drugs. Some items may interact with your medicine. What should I watch for while using this medicine? Your condition will be monitored carefully while you are receiving this medicine. You will need important blood work done while you are taking this medicine. This drug may make you feel generally unwell. This is not uncommon, as chemotherapy can affect healthy cells as well as cancer cells. Report any side effects. Continue your course of treatment even though you feel ill unless your doctor tells you to stop. In some cases, you may be given additional medicines to help with side effects. Follow all directions for their use. You may get drowsy or dizzy. Do not drive, use machinery, or do anything that needs mental alertness until you know how this medicine affects you. Do not stand or sit up quickly, especially if you are an older patient. This reduces the risk of dizzy or fainting spells. Call your doctor or health care professional for advice if you get a fever, chills or sore throat, or other symptoms of a cold or flu. Do not treat yourself. This drug decreases your body's ability to fight infections. Try to avoid being around people who are sick. This medicine may increase your risk to bruise or bleed. Call your doctor or health care professional if you notice any unusual bleeding. Be careful brushing and flossing your teeth or using a toothpick because you may get an infection or bleed more easily. If you have any dental work done, tell your dentist you are receiving this medicine. Avoid taking products that contain aspirin, acetaminophen, ibuprofen, naproxen, or ketoprofen unless instructed by your doctor. These medicines may hide a fever. Do not become pregnant while taking this medicine. Women should inform their doctor if they wish to become pregnant or think they might be pregnant. There is a potential for  serious side effects to an unborn child. Talk to your health care professional or pharmacist for more information. Do not breast-feed an infant while taking this medicine. What side effects may I notice from receiving this medicine? Side effects that you should report to your doctor or health care professional as soon as possible: -allergic reactions like skin rash, itching or hives, swelling of the face, lips, or tongue -low blood counts - this medicine may decrease the number of white blood cells, red blood cells and platelets. You may be at increased risk for infections and bleeding. -signs of infection - fever or chills, cough, sore throat, pain or difficulty passing urine -signs of decreased platelets or bleeding - bruising, pinpoint red spots on the skin, black, tarry stools, blood in the urine -signs of decreased red blood cells - unusually weak or tired, fainting spells, lightheadedness -breathing problems -chest pain -diarrhea -feeling faint or lightheaded, falls -flushing, runny nose, sweating during infusion -mouth sores or pain -pain, swelling, redness or irritation where injected -pain, swelling, warmth in the leg -pain, tingling, numbness in the hands or feet -problems with balance, talking, walking -stomach cramps, pain -trouble passing urine or change in the amount of urine -vomiting as to be unable to hold down drinks or  food -yellowing of the eyes or skin Side effects that usually do not require medical attention (report to your doctor or health care professional if they continue or are bothersome): -constipation -hair loss -headache -loss of appetite -nausea, vomiting -stomach upset This list may not describe all possible side effects. Call your doctor for medical advice about side effects. You may report side effects to FDA at 1-800-FDA-1088. Where should I keep my medicine? This drug is given in a hospital or clinic and will not be stored at home. NOTE: This sheet  is a summary. It may not cover all possible information. If you have questions about this medicine, talk to your doctor, pharmacist, or health care provider.  2013, [employer]. (12/28/2007 4:29:12 PM)

## 2012-12-29 ENCOUNTER — Other Ambulatory Visit: Payer: Medicare Other

## 2012-12-29 ENCOUNTER — Ambulatory Visit (HOSPITAL_BASED_OUTPATIENT_CLINIC_OR_DEPARTMENT_OTHER): Payer: Medicare Other

## 2012-12-29 VITALS — BP 130/74 | HR 92 | Temp 97.2°F | Resp 18

## 2012-12-29 DIAGNOSIS — C787 Secondary malignant neoplasm of liver and intrahepatic bile duct: Secondary | ICD-10-CM

## 2012-12-29 DIAGNOSIS — C259 Malignant neoplasm of pancreas, unspecified: Secondary | ICD-10-CM

## 2012-12-29 DIAGNOSIS — C252 Malignant neoplasm of tail of pancreas: Secondary | ICD-10-CM

## 2012-12-29 DIAGNOSIS — Z452 Encounter for adjustment and management of vascular access device: Secondary | ICD-10-CM

## 2012-12-29 DIAGNOSIS — C50919 Malignant neoplasm of unspecified site of unspecified female breast: Secondary | ICD-10-CM

## 2012-12-29 MED ORDER — SODIUM CHLORIDE 0.9 % IJ SOLN
10.0000 mL | INTRAMUSCULAR | Status: DC | PRN
  Administered 2012-12-29: 10 mL
  Filled 2012-12-29: qty 10

## 2012-12-29 MED ORDER — HEPARIN SOD (PORK) LOCK FLUSH 100 UNIT/ML IV SOLN
500.0000 [IU] | Freq: Once | INTRAVENOUS | Status: AC | PRN
  Administered 2012-12-29: 500 [IU]
  Filled 2012-12-29: qty 5

## 2013-01-03 ENCOUNTER — Other Ambulatory Visit: Payer: Medicare Other | Admitting: Lab

## 2013-01-07 ENCOUNTER — Emergency Department (HOSPITAL_COMMUNITY): Payer: Medicare Other

## 2013-01-07 ENCOUNTER — Inpatient Hospital Stay (HOSPITAL_COMMUNITY)
Admission: EM | Admit: 2013-01-07 | Discharge: 2013-01-09 | DRG: 866 | Disposition: A | Discharge status: Home / Self Care | Payer: Medicare Other | Attending: Internal Medicine | Admitting: Internal Medicine

## 2013-01-07 ENCOUNTER — Encounter (HOSPITAL_COMMUNITY): Payer: Self-pay | Admitting: Emergency Medicine

## 2013-01-07 DIAGNOSIS — C50919 Malignant neoplasm of unspecified site of unspecified female breast: Secondary | ICD-10-CM | POA: Diagnosis present

## 2013-01-07 DIAGNOSIS — E1142 Type 2 diabetes mellitus with diabetic polyneuropathy: Secondary | ICD-10-CM | POA: Diagnosis present

## 2013-01-07 DIAGNOSIS — G4730 Sleep apnea, unspecified: Secondary | ICD-10-CM | POA: Diagnosis present

## 2013-01-07 DIAGNOSIS — C252 Malignant neoplasm of tail of pancreas: Secondary | ICD-10-CM | POA: Diagnosis present

## 2013-01-07 DIAGNOSIS — E1149 Type 2 diabetes mellitus with other diabetic neurological complication: Secondary | ICD-10-CM | POA: Diagnosis present

## 2013-01-07 DIAGNOSIS — C259 Malignant neoplasm of pancreas, unspecified: Secondary | ICD-10-CM | POA: Diagnosis present

## 2013-01-07 DIAGNOSIS — R509 Fever, unspecified: Secondary | ICD-10-CM

## 2013-01-07 DIAGNOSIS — Z794 Long term (current) use of insulin: Secondary | ICD-10-CM

## 2013-01-07 DIAGNOSIS — Z7982 Long term (current) use of aspirin: Secondary | ICD-10-CM

## 2013-01-07 DIAGNOSIS — I10 Essential (primary) hypertension: Secondary | ICD-10-CM | POA: Diagnosis present

## 2013-01-07 DIAGNOSIS — Z79899 Other long term (current) drug therapy: Secondary | ICD-10-CM

## 2013-01-07 DIAGNOSIS — E119 Type 2 diabetes mellitus without complications: Secondary | ICD-10-CM

## 2013-01-07 DIAGNOSIS — F329 Major depressive disorder, single episode, unspecified: Secondary | ICD-10-CM | POA: Diagnosis present

## 2013-01-07 DIAGNOSIS — B9789 Other viral agents as the cause of diseases classified elsewhere: Principal | ICD-10-CM | POA: Diagnosis present

## 2013-01-07 DIAGNOSIS — F3289 Other specified depressive episodes: Secondary | ICD-10-CM | POA: Diagnosis present

## 2013-01-07 DIAGNOSIS — D6481 Anemia due to antineoplastic chemotherapy: Secondary | ICD-10-CM | POA: Diagnosis present

## 2013-01-07 DIAGNOSIS — T451X5A Adverse effect of antineoplastic and immunosuppressive drugs, initial encounter: Secondary | ICD-10-CM | POA: Diagnosis present

## 2013-01-07 DIAGNOSIS — C787 Secondary malignant neoplasm of liver and intrahepatic bile duct: Secondary | ICD-10-CM | POA: Diagnosis present

## 2013-01-07 LAB — URINALYSIS, ROUTINE W REFLEX MICROSCOPIC
Ketones, ur: NEGATIVE mg/dL
Leukocytes, UA: NEGATIVE
Nitrite: NEGATIVE
Protein, ur: NEGATIVE mg/dL
pH: 7.5 (ref 5.0–8.0)

## 2013-01-07 LAB — COMPREHENSIVE METABOLIC PANEL
ALT: 38 U/L (ref 0–53)
BUN: 30 mg/dL — ABNORMAL HIGH (ref 6–23)
CO2: 28 mEq/L (ref 19–32)
Calcium: 8.7 mg/dL (ref 8.4–10.5)
Creatinine, Ser: 0.88 mg/dL (ref 0.50–1.35)
GFR calc Af Amer: >90 mL/min (ref >90)
GFR calc non Af Amer: 82 mL/min — ABNORMAL LOW (ref >90)
Glucose, Bld: 147 mg/dL — ABNORMAL HIGH (ref 70–99)
Total Protein: 5.9 g/dL — ABNORMAL LOW (ref 6.0–8.3)

## 2013-01-07 LAB — CBC WITH DIFFERENTIAL/PLATELET
Eosinophils Absolute: 0 10*3/uL (ref 0.0–0.7)
Eosinophils Relative: 0 % (ref 0–5)
HCT: 24.2 % — ABNORMAL LOW (ref 39.0–52.0)
Lymphocytes Relative: 7 % — ABNORMAL LOW (ref 12–46)
Lymphs Abs: 0.5 10*3/uL — ABNORMAL LOW (ref 0.7–4.0)
MCH: 28.7 pg (ref 26.0–34.0)
MCV: 84.6 fL (ref 78.0–100.0)
Monocytes Absolute: 0.6 10*3/uL (ref 0.1–1.0)
Monocytes Relative: 8 % (ref 3–12)
RBC: 2.86 MIL/uL — ABNORMAL LOW (ref 4.22–5.81)
WBC: 6.6 10*3/uL (ref 4.0–10.5)

## 2013-01-07 MED ORDER — ENSURE COMPLETE PO LIQD
237.0000 mL | Freq: Two times a day (BID) | ORAL | Status: DC
  Administered 2013-01-07: 237 mL via ORAL
  Filled 2013-01-07 (×2): qty 237

## 2013-01-07 MED ORDER — ENOXAPARIN SODIUM 40 MG/0.4ML ~~LOC~~ SOLN
40.0000 mg | SUBCUTANEOUS | Status: DC
  Administered 2013-01-08: 40 mg via SUBCUTANEOUS
  Filled 2013-01-07 (×2): qty 0.4

## 2013-01-07 MED ORDER — ACETAMINOPHEN 325 MG PO TABS
650.0000 mg | ORAL_TABLET | Freq: Once | ORAL | Status: AC
  Administered 2013-01-07: 650 mg via ORAL
  Filled 2013-01-07: qty 2

## 2013-01-07 MED ORDER — ACETAMINOPHEN 500 MG PO TABS: 500.0000 mg | ORAL_TABLET | Freq: Four times a day (QID) | ORAL | Status: DC | PRN

## 2013-01-07 MED ORDER — MAGIC MOUTHWASH
5.0000 mL | Freq: Four times a day (QID) | ORAL | Status: DC
  Administered 2013-01-08 – 2013-01-09 (×5): 5 mL via ORAL
  Filled 2013-01-07 (×10): qty 5

## 2013-01-07 MED ORDER — SODIUM CHLORIDE 0.9 % IV SOLN
Freq: Once | INTRAVENOUS | Status: AC
  Administered 2013-01-08: 75 mL/h via INTRAVENOUS

## 2013-01-07 MED ORDER — ONDANSETRON HCL 4 MG/2ML IJ SOLN
4.0000 mg | Freq: Once | INTRAMUSCULAR | Status: AC
  Administered 2013-01-07: 4 mg via INTRAVENOUS
  Filled 2013-01-07: qty 2

## 2013-01-07 MED ORDER — OXYCODONE HCL 5 MG PO TABS
5.0000 mg | ORAL_TABLET | ORAL | Status: DC | PRN
  Administered 2013-01-08: 5 mg via ORAL
  Filled 2013-01-07: qty 1

## 2013-01-07 MED ORDER — TAMSULOSIN HCL 0.4 MG PO CAPS
0.4000 mg | ORAL_CAPSULE | Freq: Every day | ORAL | Status: DC
  Administered 2013-01-08 – 2013-01-09 (×2): 0.4 mg via ORAL
  Filled 2013-01-07 (×2): qty 1

## 2013-01-07 MED ORDER — ONDANSETRON HCL 8 MG PO TABS: 8.0000 mg | ORAL_TABLET | Freq: Two times a day (BID) | ORAL | Status: DC | PRN

## 2013-01-07 MED ORDER — ASPIRIN EC 81 MG PO TBEC
81.0000 mg | DELAYED_RELEASE_TABLET | Freq: Every day | ORAL | Status: DC
  Administered 2013-01-08 – 2013-01-09 (×2): 81 mg via ORAL
  Filled 2013-01-07 (×2): qty 1

## 2013-01-07 MED ORDER — SODIUM CHLORIDE 0.9 % IV SOLN
Freq: Once | INTRAVENOUS | Status: DC
  Administered 2013-01-07: 22:00:00 via INTRAVENOUS

## 2013-01-07 MED ORDER — OXYCODONE HCL ER 10 MG PO T12A
10.0000 mg | EXTENDED_RELEASE_TABLET | Freq: Two times a day (BID) | ORAL | Status: DC
  Administered 2013-01-08 – 2013-01-09 (×2): 10 mg via ORAL
  Filled 2013-01-07 (×2): qty 1

## 2013-01-07 MED ORDER — FAMOTIDINE 20 MG PO TABS
20.0000 mg | ORAL_TABLET | Freq: Every day | ORAL | Status: DC
  Administered 2013-01-08: 20 mg via ORAL
  Filled 2013-01-07: qty 1

## 2013-01-07 MED ORDER — SODIUM CHLORIDE 0.9 % IV SOLN
Freq: Once | INTRAVENOUS | Status: AC
  Administered 2013-01-07: 22:00:00 via INTRAVENOUS

## 2013-01-07 MED ORDER — TERAZOSIN HCL 2 MG PO CAPS
2.0000 mg | ORAL_CAPSULE | Freq: Two times a day (BID) | ORAL | Status: DC
  Administered 2013-01-08: 2 mg via ORAL
  Filled 2013-01-07 (×3): qty 1

## 2013-01-07 MED ORDER — INSULIN GLARGINE 100 UNIT/ML ~~LOC~~ SOLN
24.0000 [IU] | Freq: Every day | SUBCUTANEOUS | Status: DC
  Administered 2013-01-08 (×2): 24 [IU] via SUBCUTANEOUS
  Filled 2013-01-07 (×2): qty 0.24

## 2013-01-07 MED ORDER — MIRTAZAPINE 30 MG PO TABS
30.0000 mg | ORAL_TABLET | Freq: Every day | ORAL | Status: DC
  Administered 2013-01-08 (×2): 30 mg via ORAL
  Filled 2013-01-07 (×3): qty 1

## 2013-01-07 MED ORDER — GABAPENTIN 100 MG PO CAPS
100.0000 mg | ORAL_CAPSULE | Freq: Three times a day (TID) | ORAL | Status: DC
  Administered 2013-01-08 – 2013-01-09 (×5): 100 mg via ORAL
  Filled 2013-01-07 (×7): qty 1

## 2013-01-07 MED ORDER — DEXTROSE 5 % IV SOLN
2.0000 g | Freq: Once | INTRAVENOUS | Status: AC
  Administered 2013-01-07: 2 g via INTRAVENOUS
  Filled 2013-01-07: qty 2

## 2013-01-07 NOTE — ED Notes (Signed)
Patient states he started running a fever around 1500. Patient states he is also having nausea and chills. Denies vomiting, diarrhea, no cough. Denies pain at this time. Patient is A&O x4. Patient reports last chemo treatment to have been 2 weeks ago.

## 2013-01-07 NOTE — ED Notes (Signed)
Barbee Shropshire, RN at bedside to access port

## 2013-01-07 NOTE — ED Notes (Signed)
Pt c/o fever, chills, nausea. Current chemo pt, last tx 2 weeks ago.

## 2013-01-07 NOTE — ED Provider Notes (Signed)
History     CSN: [id number]  Arrival date & time 01/07/13  2017   First MD Initiated Contact with Patient 01/07/13 2031      Chief Complaint  Patient presents with  . Fever    (Consider location/radiation/quality/duration/timing/severity/associated sxs/prior treatment) Patient is a 78 y.o. male presenting with fever. The history is provided by the patient. No language interpreter was used.  Fever Associated symptoms: chills   Associated symptoms: no confusion, no congestion, no cough, no diarrhea, no dysuria, no headaches, no myalgias, no sore throat and no vomiting   Associated symptoms comment:  Fever, chills that started this afternoon. No cough, dysuria, change in appetite, vomiting, diarrhea. He denies pain. He is currently being treated for pancreatic cancer with chemotherapy and radiation.    Past Medical History  Diagnosis Date  . Heart murmur   . Abdominal pain, periumbilical   . Constipation   . Diabetes mellitus   . Hypertension   . Sleep apnea     pt states he no longer uses his cpap - lost weight  . pancreatic ca dx'd 04/2010    METASTATIC PANCREATIC CA WITH METS TO LIVER AND ABDOMINAL WALL  -chemo every 2 weeks - ONCOLOGIST DR. MOHAMED MOHAMED WITH [facility].  Marland Kitchen Port-a-cath in place     RIGHT UPPER CHEST - FOR CHEMO EVERY 2 WEEKS    Past Surgical History  Procedure Laterality Date  . Appendectomy  43-69 years old  . Pancreatectomy  06/2010    with splenectomy  . Inguinal hernia repair  as infant, age 60    LIH- infant, RIH age 37  . Knee arthroscopy  04/23/2012    Procedure: ARTHROSCOPY KNEE;  Surgeon: Loanne Drilling, MD;  Location: [facility];  Service: Orthopedics;  Laterality: Right;  with Debridement  . Splenectomy, total      Family History  Problem Relation Age of Onset  . Breast cancer Sister   . Throat cancer Father     History  Substance Use Topics  . Smoking status: Former Smoker -- 1.00 packs/day for 12 years    Quit date:  06/13/1971  . Smokeless tobacco: Not on file  . Alcohol Use: No      Review of Systems  Constitutional: Positive for fever and chills.  HENT: Negative for congestion, sore throat and trouble swallowing.   Respiratory: Negative for cough.   Gastrointestinal: Negative for vomiting, abdominal pain and diarrhea.  Genitourinary: Negative for dysuria.  Musculoskeletal: Negative for myalgias.  Neurological: Negative for headaches.  Psychiatric/Behavioral: Negative for confusion.    Allergies  Review of patient's allergies indicates no known allergies.  Home Medications   Current Outpatient Rx  Name  Route  Sig  Dispense  Refill  . Alum & Mag Hydroxide-Simeth (MAGIC MOUTHWASH) SOLN   Oral   Take 5 mLs by mouth QID.   240 mL   0   . aspirin 81 MG tablet   Oral   Take 81 mg by mouth every morning.          . gabapentin (NEURONTIN) 100 MG capsule   Oral   Take 100 mg by mouth 2 (two) times daily.          . insulin glargine (LANTUS) 100 UNIT/ML injection   Subcutaneous   Inject 24 Units into the skin at bedtime.         . lidocaine-prilocaine (EMLA) cream   Topical   Apply 1 application topically as needed.  30 g   2   . mirtazapine (REMERON) 30 MG tablet   Oral   Take 30 mg by mouth at bedtime.         . ondansetron (ZOFRAN) 8 MG tablet   Oral   Take 8 mg by mouth every 12 (twelve) hours as needed for nausea.         Marland Kitchen oxyCODONE (OXY IR/ROXICODONE) 5 MG immediate release tablet   Oral   Take 1 tablet (5 mg total) by mouth every 4 (four) hours as needed.   30 tablet   0   . oxyCODONE (OXYCONTIN) 10 MG 12 hr tablet   Oral   Take 10 mg by mouth every 12 (twelve) hours.         . ranitidine (ZANTAC) 150 MG tablet   Oral   Take 150 mg by mouth 2 (two) times daily.          . tamsulosin (FLOMAX) 0.4 MG CAPS   Oral   Take 0.4 mg by mouth 2 (two) times daily.           BP 108/51  Pulse 108  Temp(Src) 100.7 F (38.2 C) (Rectal)  Resp 26   Ht 5\' 7"  (1.702 m)  Wt 160 lb (72.576 kg)  BMI 25.05 kg/m2  SpO2 95%  Physical Exam  Constitutional: He is oriented to person, place, and time. He appears well-developed and well-nourished.  Cardiovascular: Regular rhythm.  Tachycardia present.   Pulmonary/Chest: Effort normal. He has no wheezes. He has no rales.  Porta-cath in right chest - site unremarkable.   Abdominal: Soft. There is no tenderness. There is no rebound and no guarding.  Musculoskeletal: Normal range of motion.  Neurological: He is alert and oriented to person, place, and time.  Skin: Skin is warm and dry. No rash noted.  Psychiatric: He has a normal mood and affect.    ED Course  Procedures (including critical care time)  Labs Reviewed  CBC WITH DIFFERENTIAL - Abnormal; Notable for the following:    RBC 2.86 (*)    Hemoglobin 8.2 (*)    HCT 24.2 (*)    RDW 18.7 (*)    Neutrophils Relative % 84 (*)    Lymphocytes Relative 7 (*)    Lymphs Abs 0.5 (*)    All other components within normal limits  COMPREHENSIVE METABOLIC PANEL - Abnormal; Notable for the following:    Sodium 130 (*)    Chloride 94 (*)    Glucose, Bld 147 (*)    BUN 30 (*)    Total Protein 5.9 (*)    Albumin 2.7 (*)    AST 45 (*)    Alkaline Phosphatase 211 (*)    GFR calc non Af Amer 82 (*)    All other components within normal limits  CULTURE, BLOOD (ROUTINE X 2)  CULTURE, BLOOD (ROUTINE X 2)  URINE CULTURE  URINALYSIS, ROUTINE W REFLEX MICROSCOPIC  TYPE AND SCREEN  ABO/RH   Results for orders placed during the hospital encounter of 01/07/13  CBC WITH DIFFERENTIAL      Result Value Range   WBC 6.6  4.0 - 10.5 K/uL   RBC 2.86 (*) 4.22 - 5.81 MIL/uL   Hemoglobin 8.2 (*) 13.0 - 17.0 g/dL   HCT 45.4 (*) 09.8 - 11.9 %   MCV 84.6  78.0 - 100.0 fL   MCH 28.7  26.0 - 34.0 pg   MCHC 33.9  30.0 - 36.0 g/dL   RDW  18.7 (*) 11.5 - 15.5 %   Platelets 258  150 - 400 K/uL   Neutrophils Relative % 84 (*) 43 - 77 %   Neutro Abs 5.5  1.7 -  7.7 K/uL   Lymphocytes Relative 7 (*) 12 - 46 %   Lymphs Abs 0.5 (*) 0.7 - 4.0 K/uL   Monocytes Relative 8  3 - 12 %   Monocytes Absolute 0.6  0.1 - 1.0 K/uL   Eosinophils Relative 0  0 - 5 %   Eosinophils Absolute 0.0  0.0 - 0.7 K/uL   Basophils Relative 0  0 - 1 %   Basophils Absolute 0.0  0.0 - 0.1 K/uL  COMPREHENSIVE METABOLIC PANEL      Result Value Range   Sodium 130 (*) 135 - 145 mEq/L   Potassium 4.1  3.5 - 5.1 mEq/L   Chloride 94 (*) 96 - 112 mEq/L   CO2 28  19 - 32 mEq/L   Glucose, Bld 147 (*) 70 - 99 mg/dL   BUN 30 (*) 6 - 23 mg/dL   Creatinine, Ser 1.61  0.50 - 1.35 mg/dL   Calcium 8.7  8.4 - 09.6 mg/dL   Total Protein 5.9 (*) 6.0 - 8.3 g/dL   Albumin 2.7 (*) 3.5 - 5.2 g/dL   AST 45 (*) 0 - 37 U/L   ALT 38  0 - 53 U/L   Alkaline Phosphatase 211 (*) 39 - 117 U/L   Total Bilirubin 0.4  0.3 - 1.2 mg/dL   GFR calc non Af Amer 82 (*) >90 mL/min   GFR calc Af Amer >90  >90 mL/min  URINALYSIS, ROUTINE W REFLEX MICROSCOPIC      Result Value Range   Color, Urine YELLOW  YELLOW   APPearance CLEAR  CLEAR   Specific Gravity, Urine 1.020  1.005 - 1.030   pH 7.5  5.0 - 8.0   Glucose, UA NEGATIVE  NEGATIVE mg/dL   Hgb urine dipstick NEGATIVE  NEGATIVE   Bilirubin Urine NEGATIVE  NEGATIVE   Ketones, ur NEGATIVE  NEGATIVE mg/dL   Protein, ur NEGATIVE  NEGATIVE mg/dL   Urobilinogen, UA 0.2  0.0 - 1.0 mg/dL   Nitrite NEGATIVE  NEGATIVE   Leukocytes, UA NEGATIVE  NEGATIVE  TYPE AND SCREEN      Result Value Range   ABO/RH(D) O POS     Antibody Screen PENDING     Sample Expiration 01/10/2013     Dg Chest 2 View  01/07/2013   *RADIOLOGY REPORT*  Clinical Data: Fevers.  CHEST - 2 VIEW  Comparison: 11/08/2012  Findings: There is a right chest wall porta-catheter with tip in the SVC.  Heart size appears normal.  There is no pleural effusion or edema.  No airspace consolidation identified.  IMPRESSION:  1.  No acute cardiopulmonary abnormalities.   Original Report Authenticated By:  Signa Kell, M.D.   Dg Chest 2 View  01/07/2013   *RADIOLOGY REPORT*  Clinical Data: Fevers.  CHEST - 2 VIEW  Comparison: 11/08/2012  Findings: There is a right chest wall porta-catheter with tip in the SVC.  Heart size appears normal.  There is no pleural effusion or edema.  No airspace consolidation identified.  IMPRESSION:  1.  No acute cardiopulmonary abnormalities.   Original Report Authenticated By: Signa Kell, M.D.     1. Pancreatic cancer       MDM  He appears comfortable, NAD. Febrile to 103, remains tachycardic. Discussed with Dr. Myna Hidalgo who sees  patient in ED and admits.       Arnoldo Hooker, PA-C 01/07/13 2330

## 2013-01-07 NOTE — H&P (Signed)
#   [id number] is the admit note.  Pete E.  1 thessalonians 5:16-18

## 2013-01-08 ENCOUNTER — Encounter (HOSPITAL_COMMUNITY): Payer: Self-pay

## 2013-01-08 DIAGNOSIS — G609 Hereditary and idiopathic neuropathy, unspecified: Secondary | ICD-10-CM

## 2013-01-08 DIAGNOSIS — C259 Malignant neoplasm of pancreas, unspecified: Secondary | ICD-10-CM

## 2013-01-08 LAB — CBC
Hemoglobin: 8.1 g/dL — ABNORMAL LOW (ref 13.0–17.0)
MCV: 84.8 fL (ref 78.0–100.0)
Platelets: 253 10*3/uL (ref 150–400)
RBC: 2.89 MIL/uL — ABNORMAL LOW (ref 4.22–5.81)
WBC: 7.1 10*3/uL (ref 4.0–10.5)

## 2013-01-08 LAB — GLUCOSE, CAPILLARY

## 2013-01-08 MED ORDER — FAMOTIDINE 20 MG PO TABS
20.0000 mg | ORAL_TABLET | Freq: Two times a day (BID) | ORAL | Status: DC
  Administered 2013-01-08 – 2013-01-09 (×2): 20 mg via ORAL
  Filled 2013-01-08 (×4): qty 1

## 2013-01-08 MED ORDER — HEPARIN SOD (PORK) LOCK FLUSH 100 UNIT/ML IV SOLN: 500.0000 [IU] | Freq: Every day | INTRAVENOUS | Status: DC | PRN

## 2013-01-08 MED ORDER — ACETAMINOPHEN 325 MG PO TABS
650.0000 mg | ORAL_TABLET | Freq: Once | ORAL | Status: AC
  Administered 2013-01-08: 650 mg via ORAL
  Filled 2013-01-08: qty 2

## 2013-01-08 MED ORDER — SODIUM CHLORIDE 0.9 % IJ SOLN: 3.0000 mL | INTRAMUSCULAR | Status: DC | PRN

## 2013-01-08 MED ORDER — HEPARIN SOD (PORK) LOCK FLUSH 100 UNIT/ML IV SOLN: 250.0000 [IU] | INTRAVENOUS | Status: DC | PRN

## 2013-01-08 MED ORDER — SODIUM CHLORIDE 0.9 % IV SOLN: 250.0000 mL | Freq: Once | INTRAVENOUS | Status: DC

## 2013-01-08 MED ORDER — DIPHENHYDRAMINE HCL 50 MG/ML IJ SOLN
25.0000 mg | Freq: Once | INTRAMUSCULAR | Status: AC
  Administered 2013-01-08: 25 mg via INTRAVENOUS
  Filled 2013-01-08: qty 1

## 2013-01-08 MED ORDER — DEXTROSE 5 % IV SOLN
2.0000 g | Freq: Three times a day (TID) | INTRAVENOUS | Status: DC
  Administered 2013-01-08 – 2013-01-09 (×4): 2 g via INTRAVENOUS
  Filled 2013-01-08 (×5): qty 2

## 2013-01-08 MED ORDER — SODIUM CHLORIDE 0.9 % IJ SOLN: 10.0000 mL | INTRAMUSCULAR | Status: DC | PRN

## 2013-01-08 NOTE — Progress Notes (Signed)
DIAGNOSIS: Metastatic pancreatic adenocarcinoma, now with metastatic disease to the liver and the abdominal wall, initially diagnosed with stage IIIB(T3, N1, M0) pancreatic tail adenocarcinoma in November of 2011.   PRIOR THERAPY:  #1 distal pancreatectomy and splenectomy on 06/25/2010  #2 adjuvant chemotherapy with gemcitabine started 08/20/2010 and completed 2 cycles on 12/02/2010.  #3 status post concurrent chemoradiation with Xeloda from 12/15/2010 on 10 01/24/2011.  #4 status post 2 more cycles of adjuvant gemcitabine with gemcitabine completed on 04/04/2011.  #5 FOLFOX giving every 2 weeks status post 12 cycles.  #6 Continuous infusion 5-FU and leucovorin every 2 weeks. Status post 4 cycles discontinued today secondary to disease progression.  #7 The patient was started on 05/03/2012 the first cycle of systemic chemotherapy with gemcitabine 800 mg/M2 in addition to Abraxane 80 mg/M2 on weekly basis for 3 out of every 4 weeks. Status post 6 cycles, discontinued today secondary to disease progression.   CURRENT THERAPY: FOLFIRI (5FU, Leucovorin and Irinotecan) every 2 weeks. First cycle was given on 12/07/2002 .   Subjective: The patient seen and examined today. His daughter is at the bedside. He is feeling much better today with no fever or chills. No source for the fever yesterday. He denied having any significant nausea or vomiting, no chest pain or shortness breath.  Objective: Vital signs in last 24 hours: Temp:  [97.8 F (36.6 C)-103.3 F (39.6 C)] 97.8 F (36.6 C) (05/17 0500) Pulse Rate:  [93-126] 93 (05/17 0500) Resp:  [16-26] 18 (05/17 0500) BP: (101-133)/(46-65) 133/65 mmHg (05/17 0500) SpO2:  [93 %-98 %] 98 % (05/17 0500) Weight:  [158 lb 8.2 oz (71.9 kg)-160 lb (72.576 kg)] 158 lb 8.2 oz (71.9 kg) (05/16 2337)  Intake/Output from previous day: 05/16 0701 - 05/17 0700 In: -  Out: 1000 [Urine:1000] Intake/Output this shift:    General appearance: alert, cooperative  and no distress Resp: clear to auscultation bilaterally Cardio: regular rate and rhythm, S1, S2 normal, no murmur, click, rub or gallop GI: soft, non-tender; bowel sounds normal; no masses,  no organomegaly Extremities: extremities normal, atraumatic, no cyanosis or edema  Lab Results:   Recent Labs  01/07/13 2135 01/08/13 0700  WBC 6.6 7.1  HGB 8.2* 8.1*  HCT 24.2* 24.5*  PLT 258 253   BMET  Recent Labs  01/07/13 2135  NA 130*  K 4.1  CL 94*  CO2 28  GLUCOSE 147*  BUN 30*  CREATININE 0.88  CALCIUM 8.7    Studies/Results: Dg Chest 2 View  01/07/2013   *RADIOLOGY REPORT*  Clinical Data: Fevers.  CHEST - 2 VIEW  Comparison: 11/08/2012  Findings: There is a right chest wall porta-catheter with tip in the SVC.  Heart size appears normal.  There is no pleural effusion or edema.  No airspace consolidation identified.  IMPRESSION:  1.  No acute cardiopulmonary abnormalities.   Original Report Authenticated By: Signa Kell, M.D.    Medications: I have reviewed the patient's current medications.  CODE STATUS: Full code  Assessment/Plan: 1) metastatic pancreatic adenocarcinoma: Currently on systemic chemotherapy with FOLFIRI. He is scheduled for the next cycle of chemotherapy on Monday. I will monitor the patient closely and see if we can proceed with the next cycle as planned. 2) fever of unknown radiology: Currently resolved. Continue Maxipime for now. Me switched to Cipro before discharge home. 3) chemotherapy-induced anemia: I will arrange for the patient to receive 2 units of PRBCs today. 4) diabetes mellitus: Continue Lantus and metformin 5) hypertension: Stable. 6)  peripheral neuropathy: Continue gabapentin. 7) depression: Continue Remeron 8) DVT prophylaxis: Lovenox 40 mg subcutaneously daily. 9) disposition: Home tomorrow or Monday if he is feeling better.  LOS: 1 day    Ellen Mayol K. 01/08/2013

## 2013-01-08 NOTE — Progress Notes (Signed)
ANTIBIOTIC CONSULT NOTE - INITIAL  Pharmacy Consult for Cefepime  Indication: Fever in cancer patient  No Known Allergies  Patient Measurements: Height: 5\' 7"  (170.2 cm) Weight: 158 lb 8.2 oz (71.9 kg) IBW/kg (Calculated) : 66.1 Adjusted Body Weight:   Vital Signs: Temp: 98.9 F (37.2 C) (05/16 2337) Temp src: Oral (05/16 2337) BP: 101/46 mmHg (05/16 2337) Pulse Rate: 106 (05/16 2337) Intake/Output from previous day: 05/16 0701 - 05/17 0700 In: -  Out: 700 [Urine:700] Intake/Output from this shift: Total I/O In: -  Out: 700 [Urine:700]  Labs:  Recent Labs  01/07/13 2135  WBC 6.6  HGB 8.2*  PLT 258  CREATININE 0.88   Estimated Creatinine Clearance: 67.8 ml/min (by C-G formula based on Cr of 0.88). No results found for this basename: VANCOTROUGH, VANCOPEAK, VANCORANDOM, GENTTROUGH, GENTPEAK, GENTRANDOM, TOBRATROUGH, TOBRAPEAK, TOBRARND, AMIKACINPEAK, AMIKACINTROU, AMIKACIN,  in the last 72 hours   Microbiology: No results found for this or any previous visit (from the past 720 hour(s)).  Medical History: Past Medical History  Diagnosis Date  . Heart murmur   . Abdominal pain, periumbilical   . Constipation   . Diabetes mellitus   . Hypertension   . Sleep apnea     pt states he no longer uses his cpap - lost weight  . pancreatic ca dx'd 04/2010    METASTATIC PANCREATIC CA WITH METS TO LIVER AND ABDOMINAL WALL  -chemo every 2 weeks - ONCOLOGIST DR. MOHAMED MOHAMED WITH [facility].  Marland Kitchen Port-a-cath in place     RIGHT UPPER CHEST - FOR CHEMO EVERY 2 WEEKS    Medications:  Anti-infectives   Start     Dose/Rate Route Frequency Ordered Stop   01/08/13 0600  ceFEPIme (MAXIPIME) 2 g in dextrose 5 % 50 mL IVPB     2 g 100 mL/hr over 30 Minutes Intravenous 3 times per day 01/08/13 0252     01/07/13 2245  ceFEPIme (MAXIPIME) 2 g in dextrose 5 % 50 mL IVPB     2 g 100 mL/hr over 30 Minutes Intravenous  Once 01/07/13 2241 01/07/13 2333      Assessment: Patient with fever and cancer.  First dose of antibiotics already given in ED.  Goal of Therapy:  Cefepime dosed based on patient weight and renal function   Plan:  Follow up culture results Cefepime 2gm iv q8hr  Darlina Guys, Mong Neal Crowford 01/08/2013,2:54 AM

## 2013-01-08 NOTE — H&P (Signed)
Philip, Shaw NO.:  [id number]  MEDICAL RECORD NO.:  [id number]  LOCATION:  1342                         FACILITY:  [facility]  PHYSICIAN:  Josph Macho, M.D.  DATE OF BIRTH:  [birth year]  DATE OF ADMISSION:  01/07/2013 DATE OF DISCHARGE:                             HISTORY & PHYSICAL   REASON FOR ADMISSION: 1. Fever/chills. 2. Metastatic pancreatic cancer. 3. Insulin-dependent diabetes.  HISTORY OF PRESENT ILLNESS:  Mr. Philip Shaw is a nice 78 year old white gentleman.  He has followed with Dr. Arbutus Ped.  He has metastatic pancreatic cancer.  He has been on chemotherapy.  He is getting FOLFIRI. His last dose was 2 weeks ago.  He subsequently developed a fever of 102 at home.  His daughter called me.  I felt that he needs to go to the emergency room for evaluation. He subsequently was seen in the emergency room.  Temperature was 103.3.  In the emergency room, he had lab work done.  White cell count 6.6, hemoglobin 8.2, hematocrit 24.2, platelet count 258.  His electrolytes were pretty unremarkable.  LFTs looked okay.  His renal function was fine.  A chest x-ray done, which did not show any evidence of pneumonia.  The chills are not shaking chills or rigors.  I felt, however, that with him being on chemotherapy that he would benefit from being hospitalized and at least being watched over the weekend.  I felt empiric antibiotic should be instituted because of his chemotherapy.  PAST MEDICAL HISTORY:  Remarkable for: 1. Insulin dependent diabetes. 2. Hypertension. 3. Sleep apnea.  ALLERGIES:  None.  MEDICATIONS: 1. OxyContin 10 mg p.o. b.i.d. 2. Oxycodone 5 mg p.o. q.4 hours p.r.n. 3. Remeron 30 mg p.o. at bedtime. 4. Aspirin 81 mg p.o. daily. 5. Zantac 150 mg p.o. b.i.d., Flomax 0.4 mg p.o. b.i.d. 6. Neurontin 100 mg p.o. t.i.d.  SOCIAL HISTORY:  Negative for alcohol or tobacco use.  REVIEW OF SYSTEMS:  As stated in the history of present  illness.  PHYSICAL EXAMINATION:  GENERAL:  This is a fairly well-developed, well- nourished white gentleman in no obvious distress. VITAL SIGNS:  Temperature 103.3, pulse 126, respiratory rate 16, blood pressure 116/61, oxygen saturation 93%. HEENT:  Normocephalic, atraumatic skull.  There are no ocular or oral lesions.  He has no palpable cervical or supraclavicular lymph nodes. Oral mucosa maybe slightly dry. NECK:  Supple, with no adenopathy.  He has good range of motion of his neck. LUNGS:  Clear bilaterally.  There are no rales, wheezes, or rhonchi. CARDIAC:  Tachycardia, but regular.  He has no murmurs, rubs, or bruits. ABDOMEN:  Soft.  He has good bowel sounds.  There is a well healed laparotomy scar.  There is no fluid wave.  There is no guarding or rebound tenderness.  There is no palpable hepatosplenomegaly. EXTREMITIES:  No clubbing, cyanosis, or edema.  No palpable venous cord is noted his legs. NEUROLOGICAL:  No focal neurological deficits.  IMPRESSION:  Mr. Philip Shaw is a very nice 78 year old gentleman with metastatic pancreatic cancer.  He now has a temperature of 103.3.  There is no localizing signs.  His Port-A-Cath site looks okay.  I really  cannot detect any kind of heart murmur.  We will have to see what his blood cultures show.  Also, urine culture was checked.  I would start him empirically on Maxipime.  I do not feel comfortable watching him without antibiotics given that temperature being so high.  We will admit him to the Oncology floor.  Again, we will start him empirically on antibiotics with Maxipime.  I think this is reasonable.  With pancreatic cancer and liver mets, it is possible that the temperature could be from his hepatic mets by itself.  However, typically, temperatures were not as high with liver metastases.  We will give him IV fluids.  It is possible he may need to be transfused.  We will see what his CBC is like tomorrow.  We will start  him on IV fluids.  We will watch his blood sugars.  Again, he will be admitted to the Oncology floor.  For now, he is a full code.    Josph Macho, M.D.    PRE/MEDQ  D:  01/07/2013  T:  01/08/2013  Job:  [id number]

## 2013-01-08 NOTE — ED Provider Notes (Signed)
Medical screening examination/treatment/procedure(s) were performed by non-physician practitioner and as supervising physician I was immediately available for consultation/collaboration.  Sunnie Nielsen, MD 01/08/13 [phone]

## 2013-01-09 DIAGNOSIS — T451X5A Adverse effect of antineoplastic and immunosuppressive drugs, initial encounter: Secondary | ICD-10-CM

## 2013-01-09 LAB — BASIC METABOLIC PANEL
BUN: 18 mg/dL (ref 6–23)
CO2: 28 mEq/L (ref 19–32)
Calcium: 8.8 mg/dL (ref 8.4–10.5)
Creatinine, Ser: 0.73 mg/dL (ref 0.50–1.35)
GFR calc non Af Amer: 89 mL/min — ABNORMAL LOW (ref >90)
Glucose, Bld: 113 mg/dL — ABNORMAL HIGH (ref 70–99)
Sodium: 131 mEq/L — ABNORMAL LOW (ref 135–145)

## 2013-01-09 LAB — URINE CULTURE

## 2013-01-09 LAB — TYPE AND SCREEN
ABO/RH(D): O POS
Antibody Screen: NEGATIVE
Unit division: 0
Unit division: 0

## 2013-01-09 MED ORDER — HEPARIN SOD (PORK) LOCK FLUSH 100 UNIT/ML IV SOLN
INTRAVENOUS | Status: AC
  Administered 2013-01-09: 09:00:00
  Filled 2013-01-09: qty 5

## 2013-01-09 NOTE — Progress Notes (Signed)
Discharge instructions explained to pt and his daughter( whom is a Engineer, civil (consulting)). cbg- 117 THIS am. pORT FLUSHED WITH 10 ML OF ns AND 500 UNITS hEPARIN

## 2013-01-09 NOTE — Discharge Summary (Signed)
[name], [name]               ACCOUNT NO.:  [id number]  MEDICAL RECORD NO.:  [id number]  LOCATION:  1342                         FACILITY:  [facility]  PHYSICIAN:  Josph Macho, M.D.  DATE OF BIRTH:  May 19, 1935  DATE OF ADMISSION:  01/07/2013 DATE OF DISCHARGE:  01/09/2013                              DISCHARGE SUMMARY   DIAGNOSES UPON DISCHARGE: 1. Fever-likely viral etiology. 2. Metastatic pancreatic cancer. 3. Insulin-dependent diabetes. 4. Anemia secondary to chemotherapy-status post transfusion.  CONDITION ON DISCHARGE:  Stable.  ACTIVITIES:  As tolerated.  DIET:  Low sugar diet.  Patient will also continue supplementation.  MEDICATIONS UPON DISCHARGE: 1. Insulin 24 units subcu at bedtime (Lantus). 2. Neurontin 100 mg p.o. b.i.d. 3. Remeron 30 mg p.o. at bedtime. 4. OxyContin 10 mg p.o. b.i.d. 5. Oxycodone 5 mg p.o. q.4 hours p.r.n. 6. Aspirin 81 mg p.o. daily. 7. Zofran 8 mg p.o. q.12 hours p.r.n. 8. Magic mouthwash 5 mL p.o. q.i.d.  FOLLOWUP:  The patient will follow up with Dr. Gwenyth Bouillon as scheduled.  HOSPITAL COURSE:  Mr. Cosma was admitted from the emergency room on the 16th.  He came in because of temperature of 103.3.  We went ahead and did cultures on him.  All cultures were negative  Chest x-ray done which did not show any evidence of pneumonia.  Again because of his chemotherapy and the fact that it was somewhat immunocompromised, I felt that hospitalization was indicated for evaluation.  He was started on Maxipime.  His temperature has defervesced overnight. He remained afebrile during his hospital course.  He was transfused with blood on the 17th.  His hemoglobin was 8.1.  He had 2 units of blood.  His kidney function has been doing okay.  His blood sugars have been under good control.  He has been walking.  He has had no nausea or vomiting.  He has had no diarrhea.  I felt that he could be discharged given the fact that his cultures  were negative and that he was afebrile and he was not neutropenic.  PHYSICAL EXAMINATION UPON DISCHARGE:  Shows a well-developed, well- nourished white gentleman in no obvious distress.  Vital Signs: Temperature of 97.6, pulse 71, respiratory rate 16, blood pressure 131/70.  Head and neck:  Shows normocephalic and atraumatic skull. There are no ocular or oral lesions.  There are no palpable cervical or supraclavicular lymph nodes.  Lungs:  Clear bilaterally.  There are no rales, wheezes, or rhonchi.  Cardiac:  Regular rate and rhythm with a normal S1, S2.  There are no murmurs, rubs, or bruits.  Abdomen:  Soft with good bowel sounds.  There is no palpable abdominal mass.  There is no palpable hepatosplenomegaly.  He has a well-healed laparotomy scar. Extremities:  Shows no clubbing, cyanosis, or edema.  Neurological: Shows no focal neurological deficits.  Skin:  No rashes, ecchymosis or petechia.     Josph Macho, M.D.     PRE/MEDQ  D:  01/09/2013  T:  01/09/2013  Job:  [id number]

## 2013-01-09 NOTE — Discharge Summary (Signed)
#   [id number] is d/c summary.

## 2013-01-10 ENCOUNTER — Telehealth: Payer: Self-pay | Admitting: Medical Oncology

## 2013-01-10 ENCOUNTER — Ambulatory Visit (HOSPITAL_BASED_OUTPATIENT_CLINIC_OR_DEPARTMENT_OTHER): Payer: Medicare Other

## 2013-01-10 ENCOUNTER — Other Ambulatory Visit (HOSPITAL_BASED_OUTPATIENT_CLINIC_OR_DEPARTMENT_OTHER): Payer: Medicare Other | Admitting: Lab

## 2013-01-10 ENCOUNTER — Ambulatory Visit (HOSPITAL_BASED_OUTPATIENT_CLINIC_OR_DEPARTMENT_OTHER): Payer: Medicare Other | Admitting: Internal Medicine

## 2013-01-10 ENCOUNTER — Telehealth: Payer: Self-pay | Admitting: Internal Medicine

## 2013-01-10 ENCOUNTER — Encounter: Payer: Self-pay | Admitting: Internal Medicine

## 2013-01-10 VITALS — BP 120/70 | HR 85 | Temp 96.8°F | Resp 18 | Ht 67.0 in | Wt 158.8 lb

## 2013-01-10 DIAGNOSIS — C787 Secondary malignant neoplasm of liver and intrahepatic bile duct: Secondary | ICD-10-CM

## 2013-01-10 DIAGNOSIS — C259 Malignant neoplasm of pancreas, unspecified: Secondary | ICD-10-CM

## 2013-01-10 DIAGNOSIS — C252 Malignant neoplasm of tail of pancreas: Secondary | ICD-10-CM

## 2013-01-10 DIAGNOSIS — R52 Pain, unspecified: Secondary | ICD-10-CM

## 2013-01-10 DIAGNOSIS — C779 Secondary and unspecified malignant neoplasm of lymph node, unspecified: Secondary | ICD-10-CM

## 2013-01-10 DIAGNOSIS — Z5111 Encounter for antineoplastic chemotherapy: Secondary | ICD-10-CM

## 2013-01-10 LAB — COMPREHENSIVE METABOLIC PANEL (CC13)
ALT: 49 U/L (ref 0–55)
AST: 28 U/L (ref 5–34)
BUN: 21.5 mg/dL (ref 7.0–26.0)
Calcium: 9.1 mg/dL (ref 8.4–10.4)
Chloride: 99 mEq/L (ref 98–107)
Creatinine: 0.8 mg/dL (ref 0.7–1.3)
Total Bilirubin: 0.52 mg/dL (ref 0.20–1.20)

## 2013-01-10 LAB — CBC WITH DIFFERENTIAL/PLATELET
BASO%: 0.5 % (ref 0.0–2.0)
HCT: 37.7 % — ABNORMAL LOW (ref 38.4–49.9)
LYMPH%: 26.7 % (ref 14.0–49.0)
MCH: 28.5 pg (ref 27.2–33.4)
MCHC: 33.4 g/dL (ref 32.0–36.0)
MCV: 85.3 fL (ref 79.3–98.0)
MONO#: 0.5 10*3/uL (ref 0.1–0.9)
MONO%: 13 % (ref 0.0–14.0)
NEUT%: 54.3 % (ref 39.0–75.0)
Platelets: 321 10*3/uL (ref 140–400)
RBC: 4.42 10*6/uL (ref 4.20–5.82)
nRBC: 0 % (ref 0–0)

## 2013-01-10 MED ORDER — SODIUM CHLORIDE 0.9 % IV SOLN
2400.0000 mg/m2 | INTRAVENOUS | Status: DC
  Administered 2013-01-10: 4450 mg via INTRAVENOUS
  Filled 2013-01-10: qty 89

## 2013-01-10 MED ORDER — HEPARIN SOD (PORK) LOCK FLUSH 100 UNIT/ML IV SOLN
500.0000 [IU] | Freq: Once | INTRAVENOUS | Status: DC | PRN
  Filled 2013-01-10: qty 5

## 2013-01-10 MED ORDER — LEUCOVORIN CALCIUM INJECTION 350 MG
400.0000 mg/m2 | Freq: Once | INTRAVENOUS | Status: AC
  Administered 2013-01-10: 744 mg via INTRAVENOUS
  Filled 2013-01-10: qty 37.2

## 2013-01-10 MED ORDER — ATROPINE SULFATE 1 MG/ML IJ SOLN: 0.5000 mg | Freq: Once | INTRAMUSCULAR | Status: DC | PRN

## 2013-01-10 MED ORDER — DEXAMETHASONE SODIUM PHOSPHATE 20 MG/5ML IJ SOLN
20.0000 mg | Freq: Once | INTRAMUSCULAR | Status: AC
  Administered 2013-01-10: 20 mg via INTRAVENOUS

## 2013-01-10 MED ORDER — IRINOTECAN HCL CHEMO INJECTION 100 MG/5ML
180.0000 mg/m2 | Freq: Once | INTRAVENOUS | Status: AC
  Administered 2013-01-10: 334 mg via INTRAVENOUS
  Filled 2013-01-10: qty 16.7

## 2013-01-10 MED ORDER — SODIUM CHLORIDE 0.9 % IJ SOLN
10.0000 mL | INTRAMUSCULAR | Status: DC | PRN
  Filled 2013-01-10: qty 10

## 2013-01-10 MED ORDER — ONDANSETRON 16 MG/50ML IVPB (CHCC)
16.0000 mg | Freq: Once | INTRAVENOUS | Status: AC
  Administered 2013-01-10: 16 mg via INTRAVENOUS

## 2013-01-10 MED ORDER — FLUOROURACIL CHEMO INJECTION 2.5 GM/50ML
400.0000 mg/m2 | Freq: Once | INTRAVENOUS | Status: AC
  Administered 2013-01-10: 750 mg via INTRAVENOUS
  Filled 2013-01-10: qty 15

## 2013-01-10 MED ORDER — SODIUM CHLORIDE 0.9 % IV SOLN
Freq: Once | INTRAVENOUS | Status: AC
  Administered 2013-01-10: 10:00:00 via INTRAVENOUS

## 2013-01-10 NOTE — Patient Instructions (Addendum)
[facility] Discharge Instructions for Patients Receiving Chemotherapy  Today you received the following chemotherapy agents Leucovorin, Adrucil and Camptosar.  To help prevent nausea and vomiting after your treatment, we encourage you to take your nausea medication as prescribed.    If you develop nausea and vomiting that is not controlled by your nausea medication, call the clinic. If it is after clinic hours your family physician or the after hours number for the clinic or go to the Emergency Department.   BELOW ARE SYMPTOMS THAT SHOULD BE REPORTED IMMEDIATELY:  *FEVER GREATER THAN 100.5 F  *CHILLS WITH OR WITHOUT FEVER  NAUSEA AND VOMITING THAT IS NOT CONTROLLED WITH YOUR NAUSEA MEDICATION  *UNUSUAL SHORTNESS OF BREATH  *UNUSUAL BRUISING OR BLEEDING  TENDERNESS IN MOUTH AND THROAT WITH OR WITHOUT PRESENCE OF ULCERS  *URINARY PROBLEMS  *BOWEL PROBLEMS  UNUSUAL RASH Items with * indicate a potential emergency and should be followed up as soon as possible.  Please let the nurse know about any problems that you may have experienced. Feel free to call the clinic you have any questions or concerns. The clinic phone number is [phone].   I have been informed and understand all the instructions given to me. I know to contact the clinic, my physician, or go to the Emergency Department if any problems should occur. I do not have any questions at this time, but understand that I may call the clinic during office hours   should I have any questions or need assistance in obtaining follow up care.    __________________________________________  _____________  __________ Signature of Patient or Authorized Representative            Date                   Time    __________________________________________ Nurse's Signature

## 2013-01-10 NOTE — Telephone Encounter (Signed)
Pt will come by today after chemo to get appt calendar appt

## 2013-01-10 NOTE — Progress Notes (Signed)
[facility] [phone]   [phone]) [phone]  OFFICE PROGRESS NOTE  [name], MD Dr. Sharman Crate Hamrick [address] [place] [address]  DIAGNOSIS: Metastatic pancreatic adenocarcinoma, now with metastatic disease to the liver and the abdominal wall, initially diagnosed with stage IIIB(T3, N1, M0) pancreatic tail adenocarcinoma in November of 2011.   PRIOR THERAPY:  #1 distal pancreatectomy and splenectomy on 06/25/2010  #2 adjuvant chemotherapy with gemcitabine started 08/20/2010 and completed 2 cycles on 12/02/2010.  #3 status post concurrent chemoradiation with Xeloda from 12/15/2010 on 10 01/24/2011.  #4 status post 2 more cycles of adjuvant gemcitabine with gemcitabine completed on 04/04/2011.  #5 FOLFOX giving every 2 weeks status post 12 cycles.  #6 Continuous infusion 5-FU and leucovorin every 2 weeks. Status post 4 cycles discontinued today secondary to disease progression.  #7 The patient was started on 05/03/2012 the first cycle of systemic chemotherapy with gemcitabine 800 mg/M2 in addition to Abraxane 80 mg/M2 on weekly basis for 3 out of every 4 weeks. Status post 6 cycles, discontinued today secondary to disease progression.   CURRENT THERAPY: FOLFIRI (5FU, Leucovorin and Irinotecan) every 2 weeks. First cycle was given on 12/06/2012 . S/P 2 cycles.  INTERVAL HISTORY: Philip Shaw 78 y.o. male returns to the clinic today for followup visit accompanied by his wife. He was recently admitted to [facility] complaining of one episode of fever that was 103. He was started on IV antibiotics in the form of Maxipime. His fever resolved the next day and this was most likely secondary to viral infection. During his hospitalization he received 2 units of PRBCs. The patient is feeling much better today. He denied having any significant fever or chills, no nausea or vomiting. He denied having any significant abdominal pain. He  denied having any chest pain, shortness of breath, cough or hemoptysis.  MEDICAL HISTORY: Past Medical History  Diagnosis Date  . Heart murmur   . Abdominal pain, periumbilical   . Constipation   . Diabetes mellitus   . Hypertension   . Sleep apnea     pt states he no longer uses his cpap - lost weight  . pancreatic ca dx'd 04/2010    METASTATIC PANCREATIC CA WITH METS TO LIVER AND ABDOMINAL WALL  -chemo every 2 weeks - ONCOLOGIST DR. Kirsta Probert WITH [facility].  Marland Kitchen Port-a-cath in place     RIGHT UPPER CHEST - FOR CHEMO EVERY 2 WEEKS    ALLERGIES:  has No Known Allergies.  MEDICATIONS:  Current Outpatient Prescriptions  Medication Sig Dispense Refill  . Alum & Mag Hydroxide-Simeth (MAGIC MOUTHWASH) SOLN Take 5 mLs by mouth QID.  240 mL  0  . aspirin 81 MG tablet Take 81 mg by mouth every morning.       . gabapentin (NEURONTIN) 100 MG capsule Take 100 mg by mouth 2 (two) times daily.       . insulin glargine (LANTUS) 100 UNIT/ML injection Inject 24 Units into the skin at bedtime.      . lidocaine-prilocaine (EMLA) cream Apply 1 application topically as needed.  30 g  2  . mirtazapine (REMERON) 30 MG tablet Take 30 mg by mouth at bedtime.      . ondansetron (ZOFRAN) 8 MG tablet Take 8 mg by mouth every 12 (twelve) hours as needed for nausea.      Marland Kitchen oxyCODONE (OXY IR/ROXICODONE) 5 MG immediate release tablet Take 1 tablet (5  mg total) by mouth every 4 (four) hours as needed.  30 tablet  0  . oxyCODONE (OXYCONTIN) 10 MG 12 hr tablet Take 10 mg by mouth every 12 (twelve) hours.      . ranitidine (ZANTAC) 150 MG tablet Take 150 mg by mouth 2 (two) times daily.       . tamsulosin (FLOMAX) 0.4 MG CAPS Take 0.4 mg by mouth 2 (two) times daily.      Marland Kitchen terazosin (HYTRIN) 2 MG capsule Take 2 mg by mouth daily.      . [DISCONTINUED] metFORMIN (GLUCOPHAGE) 850 MG tablet Take 850 mg by mouth 2 (two) times daily with a meal.        No current facility-administered medications  for this visit.   Facility-Administered Medications Ordered in Other Visits  Medication Dose Route Frequency Provider Last Rate Last Dose  . sodium chloride 0.9 % injection 10 mL  10 mL Intracatheter PRN Si Gaul, MD   10 mL at 07/26/12 1715  . sodium chloride 0.9 % injection 10 mL  10 mL Intracatheter PRN Conni Slipper, PA-C   10 mL at 09/20/12 1736    SURGICAL HISTORY:  Past Surgical History  Procedure Laterality Date  . Appendectomy  77-50 years old  . Pancreatectomy  06/2010    with splenectomy  . Inguinal hernia repair  as infant, age 89    LIH- infant, RIH age 6  . Knee arthroscopy  04/23/2012    Procedure: ARTHROSCOPY KNEE;  Surgeon: Loanne Drilling, MD;  Location: [facility];  Service: Orthopedics;  Laterality: Right;  with Debridement  . Splenectomy, total      REVIEW OF SYSTEMS:  A comprehensive review of systems was negative except for: Constitutional: positive for fatigue   PHYSICAL EXAMINATION: General appearance: alert, cooperative, fatigued and no distress Head: Normocephalic, without obvious abnormality, atraumatic Neck: no adenopathy Lymph nodes: Cervical, supraclavicular, and axillary nodes normal. Resp: clear to auscultation bilaterally Cardio: regular rate and rhythm, S1, S2 normal, no murmur, click, rub or gallop GI: soft, non-tender; bowel sounds normal; no masses,  no organomegaly Extremities: extremities normal, atraumatic, no cyanosis or edema Neurologic: Alert and oriented X 3, normal strength and tone. Normal symmetric reflexes. Normal coordination and gait  ECOG PERFORMANCE STATUS: 1 - Symptomatic but completely ambulatory  Blood pressure 120/70, pulse 85, temperature 96.8 F (36 C), temperature source Oral, resp. rate 18, height 5\' 7"  (1.702 m), weight 158 lb 12.8 oz (72.031 kg).  LABORATORY DATA: Lab Results  Component Value Date   WBC 4.2 01/10/2013   HGB 12.6* 01/10/2013   HCT 37.7* 01/10/2013   MCV 85.3 01/10/2013   PLT 321 01/10/2013       Chemistry      Component Value Date/Time   NA 131* 01/09/2013 0500   NA 137 12/27/2012 0824   K 3.9 01/09/2013 0500   K 4.7 12/27/2012 0824   CL 99 01/09/2013 0500   CL 101 12/27/2012 0824   CO2 28 01/09/2013 0500   CO2 26 12/27/2012 0824   BUN 18 01/09/2013 0500   BUN 18.5 12/27/2012 0824   CREATININE 0.73 01/09/2013 0500   CREATININE 0.9 12/27/2012 0824      Component Value Date/Time   CALCIUM 8.8 01/09/2013 0500   CALCIUM 8.9 12/27/2012 0824   ALKPHOS 211* 01/07/2013 2135   ALKPHOS 212* 12/27/2012 0824   AST 45* 01/07/2013 2135   AST 33 12/27/2012 0824   ALT 38 01/07/2013 2135   ALT 28  12/27/2012 0824   BILITOT 0.4 01/07/2013 2135   BILITOT 0.42 12/27/2012 0824       RADIOGRAPHIC STUDIES: Dg Chest 2 View  01/07/2013   *RADIOLOGY REPORT*  Clinical Data: Fevers.  CHEST - 2 VIEW  Comparison: 11/08/2012  Findings: There is a right chest wall porta-catheter with tip in the SVC.  Heart size appears normal.  There is no pleural effusion or edema.  No airspace consolidation identified.  IMPRESSION:  1.  No acute cardiopulmonary abnormalities.   Original Report Authenticated By: Signa Kell, M.D.    ASSESSMENT: This is a very pleasant 78 years old white male with metastatic pancreatic adenocarcinoma currently undergoing systemic chemotherapy with she status post 2 cycles.   PLAN: We'll proceed with cycle #3 today as scheduled. The patient would come back for followup visit in 2 weeks with the next cycle of his chemotherapy. He was advised to call immediately if he has any concerning symptoms in the interval. For pain management he will continue on OxyContin 10 mg by mouth twice a day in addition to oxycodone 5 mg by mouth every 4 hours as needed for pain. For depression, the patient will continue on Remeron 30 mg by mouth each bedtime  All questions were answered. The patient knows to call the clinic with any problems, questions or concerns. We can certainly see the patient much sooner if necessary.

## 2013-01-11 NOTE — Telephone Encounter (Signed)
erroneous

## 2013-01-12 ENCOUNTER — Ambulatory Visit (HOSPITAL_BASED_OUTPATIENT_CLINIC_OR_DEPARTMENT_OTHER): Payer: Medicare Other

## 2013-01-12 VITALS — BP 116/71 | HR 81 | Temp 97.4°F

## 2013-01-12 DIAGNOSIS — C259 Malignant neoplasm of pancreas, unspecified: Secondary | ICD-10-CM

## 2013-01-12 DIAGNOSIS — C787 Secondary malignant neoplasm of liver and intrahepatic bile duct: Secondary | ICD-10-CM

## 2013-01-12 DIAGNOSIS — C252 Malignant neoplasm of tail of pancreas: Secondary | ICD-10-CM

## 2013-01-12 MED ORDER — SODIUM CHLORIDE 0.9 % IJ SOLN
10.0000 mL | INTRAMUSCULAR | Status: DC | PRN
  Administered 2013-01-12: 10 mL
  Filled 2013-01-12: qty 10

## 2013-01-12 MED ORDER — HEPARIN SOD (PORK) LOCK FLUSH 100 UNIT/ML IV SOLN
500.0000 [IU] | Freq: Once | INTRAVENOUS | Status: AC | PRN
  Administered 2013-01-12: 500 [IU]
  Filled 2013-01-12: qty 5

## 2013-01-12 NOTE — Patient Instructions (Signed)
Call MD for problems or concerns 

## 2013-01-14 LAB — CULTURE, BLOOD (ROUTINE X 2): Culture: NO GROWTH

## 2013-01-18 ENCOUNTER — Other Ambulatory Visit: Payer: Medicare Other | Admitting: Lab

## 2013-01-18 ENCOUNTER — Other Ambulatory Visit: Payer: Self-pay | Admitting: Internal Medicine

## 2013-01-18 DIAGNOSIS — Z8672 Personal history of thrombophlebitis: Secondary | ICD-10-CM

## 2013-01-21 ENCOUNTER — Other Ambulatory Visit: Payer: Self-pay | Admitting: Internal Medicine

## 2013-01-21 DIAGNOSIS — K121 Other forms of stomatitis: Secondary | ICD-10-CM

## 2013-01-21 MED ORDER — MAGIC MOUTHWASH: 5.0000 mL | Freq: Four times a day (QID) | ORAL | Status: AC

## 2013-01-21 NOTE — Telephone Encounter (Signed)
Faxed in refill

## 2013-01-21 NOTE — Addendum Note (Signed)
Addended by: Charma Igo on: 01/21/2013 02:05 PM   Modules accepted: Orders

## 2013-01-24 ENCOUNTER — Telehealth: Payer: Self-pay | Admitting: *Deleted

## 2013-01-24 ENCOUNTER — Other Ambulatory Visit (HOSPITAL_BASED_OUTPATIENT_CLINIC_OR_DEPARTMENT_OTHER): Payer: Medicare Other | Admitting: Lab

## 2013-01-24 ENCOUNTER — Encounter: Payer: Self-pay | Admitting: Physician Assistant

## 2013-01-24 ENCOUNTER — Telehealth: Payer: Self-pay | Admitting: Internal Medicine

## 2013-01-24 ENCOUNTER — Inpatient Hospital Stay: Payer: Medicare Other

## 2013-01-24 ENCOUNTER — Ambulatory Visit (HOSPITAL_BASED_OUTPATIENT_CLINIC_OR_DEPARTMENT_OTHER): Payer: Medicare Other | Admitting: Physician Assistant

## 2013-01-24 VITALS — BP 126/73 | HR 78 | Temp 97.0°F | Resp 18 | Ht 67.0 in | Wt 157.9 lb

## 2013-01-24 DIAGNOSIS — C787 Secondary malignant neoplasm of liver and intrahepatic bile duct: Secondary | ICD-10-CM

## 2013-01-24 DIAGNOSIS — C50919 Malignant neoplasm of unspecified site of unspecified female breast: Secondary | ICD-10-CM

## 2013-01-24 DIAGNOSIS — C252 Malignant neoplasm of tail of pancreas: Secondary | ICD-10-CM

## 2013-01-24 DIAGNOSIS — C779 Secondary and unspecified malignant neoplasm of lymph node, unspecified: Secondary | ICD-10-CM

## 2013-01-24 DIAGNOSIS — C259 Malignant neoplasm of pancreas, unspecified: Secondary | ICD-10-CM

## 2013-01-24 LAB — CBC WITH DIFFERENTIAL/PLATELET
Basophils Absolute: 0 10*3/uL (ref 0.0–0.1)
HCT: 33.9 % — ABNORMAL LOW (ref 38.4–49.9)
HGB: 11 g/dL — ABNORMAL LOW (ref 13.0–17.1)
LYMPH%: 28.5 % (ref 14.0–49.0)
MCH: 28 pg (ref 27.2–33.4)
MONO#: 0.8 10*3/uL (ref 0.1–0.9)
NEUT%: 37.6 % — ABNORMAL LOW (ref 39.0–75.0)
Platelets: 294 10*3/uL (ref 140–400)
WBC: 2.6 10*3/uL — ABNORMAL LOW (ref 4.0–10.3)
lymph#: 0.7 10*3/uL — ABNORMAL LOW (ref 0.9–3.3)

## 2013-01-24 NOTE — Telephone Encounter (Signed)
gv and printed appt sched and avs for pt...emaield MW to add tx.   °

## 2013-01-24 NOTE — Telephone Encounter (Signed)
Per staff message and POF I have scheduled appts.  [name]  

## 2013-01-24 NOTE — Patient Instructions (Addendum)
Your abosulute neutrophill count is too low to proceed with chemotherapy as scheduled today Follow netropenic precautions Follow up in one week to proceed with chemotherapy provided your counts are within normal range Follow up with Dr. Arbutus Ped in 3 weeks

## 2013-01-24 NOTE — Telephone Encounter (Signed)
Weekly lab work cbc and cmet drawn 01/19/13 at [facility] given to Dr Donnald Garre to review.

## 2013-01-24 NOTE — Progress Notes (Signed)
[facility] Telephone:([phone]   [phone]) [phone]  OFFICE PROGRESS NOTE  [name], MD Dr. Sharman Crate Hamrick [address] [place] [address]  DIAGNOSIS: Metastatic pancreatic adenocarcinoma, now with metastatic disease to the liver and the abdominal wall, initially diagnosed with stage IIIB(T3, N1, M0) pancreatic tail adenocarcinoma in November of 2011.   PRIOR THERAPY:  #1 distal pancreatectomy and splenectomy on 06/25/2010  #2 adjuvant chemotherapy with gemcitabine started 08/20/2010 and completed 2 cycles on 12/02/2010.  #3 status post concurrent chemoradiation with Xeloda from 12/15/2010 on 10 01/24/2011.  #4 status post 2 more cycles of adjuvant gemcitabine with gemcitabine completed on 04/04/2011.  #5 FOLFOX giving every 2 weeks status post 12 cycles.  #6 Continuous infusion 5-FU and leucovorin every 2 weeks. Status post 4 cycles discontinued today secondary to disease progression.  #7 The patient was started on 05/03/2012 the first cycle of systemic chemotherapy with gemcitabine 800 mg/M2 in addition to Abraxane 80 mg/M2 on weekly basis for 3 out of every 4 weeks. Status post 6 cycles, discontinued today secondary to disease progression.   CURRENT THERAPY: FOLFIRI (5FU, Leucovorin and Irinotecan) every 2 weeks. First cycle was given on 12/06/2012 . S/P 3 cycles.  INTERVAL HISTORY: Philip Shaw 78 y.o. male returns to the clinic today for followup visit accompanied by his wife. Today he voices no complaints. He continues to have decreased appetite however this is long-standing. He's had no further fevers.  He denied having any significant chills, no nausea or vomiting. He denied having any significant abdominal pain. He denied having any chest pain, shortness of breath, cough or hemoptysis. He continues to have some fatigue.  MEDICAL HISTORY: Past Medical History  Diagnosis Date  . Heart murmur   . Abdominal pain, periumbilical   .  Constipation   . Diabetes mellitus   . Hypertension   . Sleep apnea     pt states he no longer uses his cpap - lost weight  . pancreatic ca dx'd 04/2010    METASTATIC PANCREATIC CA WITH METS TO LIVER AND ABDOMINAL WALL  -chemo every 2 weeks - ONCOLOGIST DR. MOHAMED MOHAMED WITH [facility].  Marland Kitchen Port-a-cath in place     RIGHT UPPER CHEST - FOR CHEMO EVERY 2 WEEKS    ALLERGIES:  has No Known Allergies.  MEDICATIONS:  Current Outpatient Prescriptions  Medication Sig Dispense Refill  . Alum & Mag Hydroxide-Simeth (MAGIC MOUTHWASH) SOLN Take 5 mLs by mouth QID.  240 mL  0  . aspirin 81 MG tablet Take 81 mg by mouth every morning.       . gabapentin (NEURONTIN) 100 MG capsule Take 100 mg by mouth 2 (two) times daily.       . insulin glargine (LANTUS) 100 UNIT/ML injection Inject 24 Units into the skin at bedtime.      . lidocaine (XYLOCAINE) 2 % solution TAKE BY MOUTH FOUR TIMES DAILY  240 mL  1  . lidocaine-prilocaine (EMLA) cream Apply 1 application topically as needed.  30 g  2  . mirtazapine (REMERON) 30 MG tablet Take 30 mg by mouth at bedtime.      . ondansetron (ZOFRAN) 8 MG tablet Take 8 mg by mouth every 12 (twelve) hours as needed for nausea.      Marland Kitchen oxyCODONE (OXY IR/ROXICODONE) 5 MG immediate release tablet Take 1 tablet (5 mg total) by mouth every 4 (four) hours as needed.  30 tablet  0  .  oxyCODONE (OXYCONTIN) 10 MG 12 hr tablet Take 10 mg by mouth every 12 (twelve) hours.      . ranitidine (ZANTAC) 150 MG tablet Take 150 mg by mouth 2 (two) times daily.       . tamsulosin (FLOMAX) 0.4 MG CAPS Take 0.4 mg by mouth 2 (two) times daily.      Marland Kitchen terazosin (HYTRIN) 2 MG capsule Take 2 mg by mouth daily.      . [DISCONTINUED] metFORMIN (GLUCOPHAGE) 850 MG tablet Take 850 mg by mouth 2 (two) times daily with a meal.        No current facility-administered medications for this visit.   Facility-Administered Medications Ordered in Other Visits  Medication Dose Route  Frequency Provider Last Rate Last Dose  . sodium chloride 0.9 % injection 10 mL  10 mL Intracatheter PRN Si Gaul, MD   10 mL at 07/26/12 1715  . sodium chloride 0.9 % injection 10 mL  10 mL Intracatheter PRN Conni Slipper, PA-C   10 mL at 09/20/12 1736    SURGICAL HISTORY:  Past Surgical History  Procedure Laterality Date  . Appendectomy  39-32 years old  . Pancreatectomy  06/2010    with splenectomy  . Inguinal hernia repair  as infant, age 29    LIH- infant, RIH age 5  . Knee arthroscopy  04/23/2012    Procedure: ARTHROSCOPY KNEE;  Surgeon: Loanne Drilling, MD;  Location: [facility];  Service: Orthopedics;  Laterality: Right;  with Debridement  . Splenectomy, total      REVIEW OF SYSTEMS:  A comprehensive review of systems was negative except for: Constitutional: positive for fatigue   PHYSICAL EXAMINATION: General appearance: alert, cooperative, fatigued and no distress Head: Normocephalic, without obvious abnormality, atraumatic Neck: no adenopathy Lymph nodes: Cervical, supraclavicular, and axillary nodes normal. Resp: clear to auscultation bilaterally Cardio: regular rate and rhythm, S1, S2 normal, no murmur, click, rub or gallop GI: soft, non-tender; bowel sounds normal; no masses,  no organomegaly Extremities: extremities normal, atraumatic, no cyanosis or edema Neurologic: Alert and oriented X 3, normal strength and tone. Normal symmetric reflexes. Normal coordination and gait  ECOG PERFORMANCE STATUS: 1 - Symptomatic but completely ambulatory  Blood pressure 126/73, pulse 78, temperature 97 F (36.1 C), temperature source Oral, resp. rate 18, height 5\' 7"  (1.702 m), weight 157 lb 14.4 oz (71.623 kg).  LABORATORY DATA: Lab Results  Component Value Date   WBC 2.6* 01/24/2013   HGB 11.0* 01/24/2013   HCT 33.9* 01/24/2013   MCV 86.3 01/24/2013   PLT 294 01/24/2013      Chemistry      Component Value Date/Time   NA 135* 01/10/2013 0802   NA 131* 01/09/2013 0500   K 4.6  01/10/2013 0802   K 3.9 01/09/2013 0500   CL 99 01/10/2013 0802   CL 99 01/09/2013 0500   CO2 27 01/10/2013 0802   CO2 28 01/09/2013 0500   BUN 21.5 01/10/2013 0802   BUN 18 01/09/2013 0500   CREATININE 0.8 01/10/2013 0802   CREATININE 0.73 01/09/2013 0500      Component Value Date/Time   CALCIUM 9.1 01/10/2013 0802   CALCIUM 8.8 01/09/2013 0500   ALKPHOS 217* 01/10/2013 0802   ALKPHOS 211* 01/07/2013 2135   AST 28 01/10/2013 0802   AST 45* 01/07/2013 2135   ALT 49 01/10/2013 0802   ALT 38 01/07/2013 2135   BILITOT 0.52 01/10/2013 0802   BILITOT 0.4 01/07/2013 2135  RADIOGRAPHIC STUDIES: Dg Chest 2 View  01/07/2013   *RADIOLOGY REPORT*  Clinical Data: Fevers.  CHEST - 2 VIEW  Comparison: 11/08/2012  Findings: There is a right chest wall porta-catheter with tip in the SVC.  Heart size appears normal.  There is no pleural effusion or edema.  No airspace consolidation identified.  IMPRESSION:  1.  No acute cardiopulmonary abnormalities.   Original Report Authenticated By: Signa Kell, M.D.    ASSESSMENT/PLAN: This is a very pleasant 78 years old white male with metastatic pancreatic adenocarcinoma currently undergoing systemic chemotherapy with a FOLFIRI (5-FU, leucovorin and irinotecan) status post 3 cycles. Unfortunately his ANC is subtherapeutic at 1.0. Patient was discussed with Dr. Darrold Span in Dr. Asa Lente absence. We will postpone his chemotherapy by one week. He'll return in one week with repeat CBC differential and C. met. And proceed with cycle #4 of his FOLFIRI as long as his labs are in a therapeutic range. He'll followup with Dr. Arbutus Ped in 3 weeks with repeat CBC differential and C. Met. Prior to cycle #5.  Laural Benes, Vasti Yagi E, PA-C   All questions were answered. The patient knows to call the clinic with any problems, questions or concerns. We can certainly see the patient much sooner if necessary.

## 2013-01-25 ENCOUNTER — Other Ambulatory Visit: Payer: Medicare Other | Admitting: Lab

## 2013-01-31 ENCOUNTER — Other Ambulatory Visit: Payer: Self-pay | Admitting: Internal Medicine

## 2013-01-31 ENCOUNTER — Other Ambulatory Visit: Payer: Medicare Other

## 2013-01-31 ENCOUNTER — Other Ambulatory Visit (HOSPITAL_BASED_OUTPATIENT_CLINIC_OR_DEPARTMENT_OTHER): Payer: Medicare Other | Admitting: Lab

## 2013-01-31 ENCOUNTER — Ambulatory Visit (HOSPITAL_BASED_OUTPATIENT_CLINIC_OR_DEPARTMENT_OTHER): Payer: Medicare Other

## 2013-01-31 VITALS — BP 124/72 | HR 83 | Temp 97.1°F

## 2013-01-31 DIAGNOSIS — C787 Secondary malignant neoplasm of liver and intrahepatic bile duct: Secondary | ICD-10-CM

## 2013-01-31 DIAGNOSIS — C259 Malignant neoplasm of pancreas, unspecified: Secondary | ICD-10-CM

## 2013-01-31 DIAGNOSIS — C252 Malignant neoplasm of tail of pancreas: Secondary | ICD-10-CM

## 2013-01-31 DIAGNOSIS — Z5111 Encounter for antineoplastic chemotherapy: Secondary | ICD-10-CM

## 2013-01-31 LAB — CBC WITH DIFFERENTIAL/PLATELET
Basophils Absolute: 0 10*3/uL (ref 0.0–0.1)
EOS%: 9.9 % — ABNORMAL HIGH (ref 0.0–7.0)
HCT: 35.2 % — ABNORMAL LOW (ref 38.4–49.9)
HGB: 11.5 g/dL — ABNORMAL LOW (ref 13.0–17.1)
LYMPH%: 32 % (ref 14.0–49.0)
MCH: 28.3 pg (ref 27.2–33.4)
MCHC: 32.7 g/dL (ref 32.0–36.0)
MCV: 86.5 fL (ref 79.3–98.0)
MONO%: 32.7 % — ABNORMAL HIGH (ref 0.0–14.0)
NEUT%: 24.7 % — ABNORMAL LOW (ref 39.0–75.0)

## 2013-01-31 LAB — COMPREHENSIVE METABOLIC PANEL (CC13)
ALT: 26 U/L (ref 0–55)
AST: 27 U/L (ref 5–34)
Albumin: 2.8 g/dL — ABNORMAL LOW (ref 3.5–5.0)
Alkaline Phosphatase: 220 U/L — ABNORMAL HIGH (ref 40–150)
BUN: 19.2 mg/dL (ref 7.0–26.0)
CO2: 27 meq/L (ref 22–29)
Calcium: 9 mg/dL (ref 8.4–10.4)
Chloride: 102 meq/L (ref 98–107)
Creatinine: 0.8 mg/dL (ref 0.7–1.3)
Glucose: 134 mg/dL — ABNORMAL HIGH (ref 70–99)
Potassium: 4.4 meq/L (ref 3.5–5.1)
Sodium: 138 meq/L (ref 136–145)
Total Bilirubin: 0.45 mg/dL (ref 0.20–1.20)
Total Protein: 6.7 g/dL (ref 6.4–8.3)

## 2013-01-31 MED ORDER — LEUCOVORIN CALCIUM INJECTION 350 MG
400.0000 mg/m2 | Freq: Once | INTRAVENOUS | Status: AC
  Administered 2013-01-31: 740 mg via INTRAVENOUS
  Filled 2013-01-31: qty 37

## 2013-01-31 MED ORDER — SODIUM CHLORIDE 0.9 % IV SOLN
Freq: Once | INTRAVENOUS | Status: AC
  Administered 2013-01-31: 09:00:00 via INTRAVENOUS

## 2013-01-31 MED ORDER — DEXAMETHASONE SODIUM PHOSPHATE 20 MG/5ML IJ SOLN
20.0000 mg | Freq: Once | INTRAMUSCULAR | Status: AC
  Administered 2013-01-31: 20 mg via INTRAVENOUS

## 2013-01-31 MED ORDER — PEGFILGRASTIM INJECTION 6 MG/0.6ML: 6.0000 mg | Freq: Once | SUBCUTANEOUS | Status: DC

## 2013-01-31 MED ORDER — IRINOTECAN HCL CHEMO INJECTION 100 MG/5ML
180.0000 mg/m2 | Freq: Once | INTRAVENOUS | Status: AC
  Administered 2013-01-31: 334 mg via INTRAVENOUS
  Filled 2013-01-31: qty 16.7

## 2013-01-31 MED ORDER — SODIUM CHLORIDE 0.9 % IV SOLN
2400.0000 mg/m2 | INTRAVENOUS | Status: DC
  Administered 2013-01-31: 4450 mg via INTRAVENOUS
  Filled 2013-01-31: qty 89

## 2013-01-31 MED ORDER — ONDANSETRON 16 MG/50ML IVPB (CHCC)
16.0000 mg | Freq: Once | INTRAVENOUS | Status: AC
  Administered 2013-01-31: 16 mg via INTRAVENOUS

## 2013-01-31 MED ORDER — FLUOROURACIL CHEMO INJECTION 2.5 GM/50ML
400.0000 mg/m2 | Freq: Once | INTRAVENOUS | Status: AC
  Administered 2013-01-31: 750 mg via INTRAVENOUS
  Filled 2013-01-31: qty 15

## 2013-01-31 NOTE — Patient Instructions (Addendum)
[facility] Discharge Instructions for Patients Receiving Chemotherapy  Today you received the following chemotherapy agents:  Camptosar, Leucovorin and 5FU  To help prevent nausea and vomiting after your treatment, we encourage you to take your nausea medication as ordered per MD.   If you develop nausea and vomiting that is not controlled by your nausea medication, call the clinic.   BELOW ARE SYMPTOMS THAT SHOULD BE REPORTED IMMEDIATELY:  *FEVER GREATER THAN 100.5 F  *CHILLS WITH OR WITHOUT FEVER  NAUSEA AND VOMITING THAT IS NOT CONTROLLED WITH YOUR NAUSEA MEDICATION  *UNUSUAL SHORTNESS OF BREATH  *UNUSUAL BRUISING OR BLEEDING  TENDERNESS IN MOUTH AND THROAT WITH OR WITHOUT PRESENCE OF ULCERS  *URINARY PROBLEMS  *BOWEL PROBLEMS  UNUSUAL RASH Items with * indicate a potential emergency and should be followed up as soon as possible.  Feel free to call the clinic you have any questions or concerns. The clinic phone number is [phone].    

## 2013-01-31 NOTE — Progress Notes (Signed)
0850-OK to treat today with ANC-1.1 and WBC-4.5 per Dr. Arbutus Ped.  Pt to receive Neulasta injection on 02/02/13 with pump d/c per Dr. Arbutus Ped.

## 2013-02-01 ENCOUNTER — Other Ambulatory Visit: Payer: Medicare Other | Admitting: Lab

## 2013-02-02 ENCOUNTER — Ambulatory Visit (HOSPITAL_BASED_OUTPATIENT_CLINIC_OR_DEPARTMENT_OTHER): Payer: Medicare Other

## 2013-02-02 ENCOUNTER — Other Ambulatory Visit: Payer: Self-pay | Admitting: Physician Assistant

## 2013-02-02 VITALS — BP 125/71 | HR 79 | Temp 98.0°F | Resp 20

## 2013-02-02 DIAGNOSIS — C252 Malignant neoplasm of tail of pancreas: Secondary | ICD-10-CM

## 2013-02-02 DIAGNOSIS — C259 Malignant neoplasm of pancreas, unspecified: Secondary | ICD-10-CM

## 2013-02-02 DIAGNOSIS — C787 Secondary malignant neoplasm of liver and intrahepatic bile duct: Secondary | ICD-10-CM

## 2013-02-02 MED ORDER — PEGFILGRASTIM INJECTION 6 MG/0.6ML
6.0000 mg | Freq: Once | SUBCUTANEOUS | Status: AC
  Administered 2013-02-02: 6 mg via SUBCUTANEOUS
  Filled 2013-02-02: qty 0.6

## 2013-02-02 MED ORDER — SODIUM CHLORIDE 0.9 % IJ SOLN
10.0000 mL | INTRAMUSCULAR | Status: DC | PRN
  Administered 2013-02-02: 10 mL
  Filled 2013-02-02: qty 10

## 2013-02-02 MED ORDER — HEPARIN SOD (PORK) LOCK FLUSH 100 UNIT/ML IV SOLN
500.0000 [IU] | Freq: Once | INTRAVENOUS | Status: AC | PRN
  Administered 2013-02-02: 500 [IU]
  Filled 2013-02-02: qty 5

## 2013-02-02 NOTE — Patient Instructions (Signed)

## 2013-02-04 ENCOUNTER — Telehealth: Payer: Self-pay | Admitting: Internal Medicine

## 2013-02-04 ENCOUNTER — Encounter: Payer: Self-pay | Admitting: Internal Medicine

## 2013-02-04 NOTE — Telephone Encounter (Signed)
pt called to [facility]cle lab only on 6.16.14 due to going out of town...did not want to r/s

## 2013-02-07 ENCOUNTER — Inpatient Hospital Stay: Payer: Medicare Other

## 2013-02-07 ENCOUNTER — Other Ambulatory Visit: Payer: Medicare Other | Admitting: Lab

## 2013-02-08 ENCOUNTER — Other Ambulatory Visit: Payer: Medicare Other | Admitting: Lab

## 2013-02-14 ENCOUNTER — Other Ambulatory Visit: Payer: Medicare Other

## 2013-02-14 ENCOUNTER — Telehealth: Payer: Self-pay | Admitting: Internal Medicine

## 2013-02-14 ENCOUNTER — Telehealth: Payer: Self-pay | Admitting: *Deleted

## 2013-02-14 ENCOUNTER — Ambulatory Visit (HOSPITAL_BASED_OUTPATIENT_CLINIC_OR_DEPARTMENT_OTHER): Payer: Medicare Other

## 2013-02-14 ENCOUNTER — Encounter: Payer: Self-pay | Admitting: Internal Medicine

## 2013-02-14 ENCOUNTER — Ambulatory Visit (HOSPITAL_BASED_OUTPATIENT_CLINIC_OR_DEPARTMENT_OTHER): Payer: Medicare Other | Admitting: Internal Medicine

## 2013-02-14 ENCOUNTER — Other Ambulatory Visit (HOSPITAL_BASED_OUTPATIENT_CLINIC_OR_DEPARTMENT_OTHER): Payer: Medicare Other | Admitting: Lab

## 2013-02-14 VITALS — BP 114/70 | HR 82 | Temp 97.2°F | Resp 18 | Ht 67.0 in | Wt 158.8 lb

## 2013-02-14 DIAGNOSIS — C792 Secondary malignant neoplasm of skin: Secondary | ICD-10-CM

## 2013-02-14 DIAGNOSIS — C259 Malignant neoplasm of pancreas, unspecified: Secondary | ICD-10-CM

## 2013-02-14 DIAGNOSIS — C252 Malignant neoplasm of tail of pancreas: Secondary | ICD-10-CM

## 2013-02-14 DIAGNOSIS — C787 Secondary malignant neoplasm of liver and intrahepatic bile duct: Secondary | ICD-10-CM

## 2013-02-14 DIAGNOSIS — R5383 Other fatigue: Secondary | ICD-10-CM

## 2013-02-14 DIAGNOSIS — Z5111 Encounter for antineoplastic chemotherapy: Secondary | ICD-10-CM

## 2013-02-14 LAB — CBC WITH DIFFERENTIAL/PLATELET
BASO%: 0.2 % (ref 0.0–2.0)
Basophils Absolute: 0 10*3/uL (ref 0.0–0.1)
EOS%: 1.9 % (ref 0.0–7.0)
HCT: 33.5 % — ABNORMAL LOW (ref 38.4–49.9)
HGB: 11 g/dL — ABNORMAL LOW (ref 13.0–17.1)
LYMPH%: 10.5 % — ABNORMAL LOW (ref 14.0–49.0)
MCH: 28 pg (ref 27.2–33.4)
MCHC: 32.8 g/dL (ref 32.0–36.0)
MCV: 85.2 fL (ref 79.3–98.0)
MONO%: 14.7 % — ABNORMAL HIGH (ref 0.0–14.0)
NEUT%: 72.7 % (ref 39.0–75.0)
Platelets: 324 10*3/uL (ref 140–400)

## 2013-02-14 LAB — COMPREHENSIVE METABOLIC PANEL (CC13)
AST: 26 U/L (ref 5–34)
Albumin: 2.9 g/dL — ABNORMAL LOW (ref 3.5–5.0)
Alkaline Phosphatase: 227 U/L — ABNORMAL HIGH (ref 40–150)
BUN: 11.7 mg/dL (ref 7.0–26.0)
Creatinine: 0.8 mg/dL (ref 0.7–1.3)
Glucose: 91 mg/dl (ref 70–99)
Potassium: 4 mEq/L (ref 3.5–5.1)

## 2013-02-14 MED ORDER — ONDANSETRON 16 MG/50ML IVPB (CHCC)
16.0000 mg | Freq: Once | INTRAVENOUS | Status: AC
  Administered 2013-02-14: 16 mg via INTRAVENOUS

## 2013-02-14 MED ORDER — IRINOTECAN HCL CHEMO INJECTION 100 MG/5ML
180.0000 mg/m2 | Freq: Once | INTRAVENOUS | Status: AC
  Administered 2013-02-14: 334 mg via INTRAVENOUS
  Filled 2013-02-14: qty 16.7

## 2013-02-14 MED ORDER — SODIUM CHLORIDE 0.9 % IV SOLN
2400.0000 mg/m2 | INTRAVENOUS | Status: DC
  Administered 2013-02-14: 4450 mg via INTRAVENOUS
  Filled 2013-02-14: qty 89

## 2013-02-14 MED ORDER — OXYCODONE HCL 10 MG PO TB12: 10.0000 mg | ORAL_TABLET | Freq: Two times a day (BID) | ORAL | Status: DC

## 2013-02-14 MED ORDER — SODIUM CHLORIDE 0.9 % IV SOLN
Freq: Once | INTRAVENOUS | Status: AC
  Administered 2013-02-14: 11:00:00 via INTRAVENOUS

## 2013-02-14 MED ORDER — ATROPINE SULFATE 0.4 MG/ML IJ SOLN
0.5000 mg | Freq: Once | INTRAMUSCULAR | Status: AC | PRN
  Administered 2013-02-14: 0.5 mg via INTRAVENOUS
  Filled 2013-02-14: qty 1.25

## 2013-02-14 MED ORDER — DEXAMETHASONE SODIUM PHOSPHATE 20 MG/5ML IJ SOLN
20.0000 mg | Freq: Once | INTRAMUSCULAR | Status: AC
  Administered 2013-02-14: 20 mg via INTRAVENOUS

## 2013-02-14 MED ORDER — LEUCOVORIN CALCIUM INJECTION 350 MG
400.0000 mg/m2 | Freq: Once | INTRAVENOUS | Status: AC
  Administered 2013-02-14: 740 mg via INTRAVENOUS
  Filled 2013-02-14: qty 37

## 2013-02-14 MED ORDER — FLUOROURACIL CHEMO INJECTION 2.5 GM/50ML
400.0000 mg/m2 | Freq: Once | INTRAVENOUS | Status: AC
  Administered 2013-02-14: 750 mg via INTRAVENOUS
  Filled 2013-02-14: qty 15

## 2013-02-14 NOTE — Telephone Encounter (Signed)
gave pt appt for lab , chemo and MD for July 2014

## 2013-02-14 NOTE — Telephone Encounter (Signed)
Per staff message and POF I have scheduled appts.  [name]  

## 2013-02-14 NOTE — Progress Notes (Signed)
[facility] [phone]   [phone]  OFFICE PROGRESS NOTE  [name], MD Dr. Sharman Crate Hamrick [address] [place] [address]  DIAGNOSIS: Metastatic pancreatic adenocarcinoma, now with metastatic disease to the liver and the abdominal wall, initially diagnosed with stage IIIB(T3, N1, M0) pancreatic tail adenocarcinoma in November of 2011.   PRIOR THERAPY:  #1 distal pancreatectomy and splenectomy on 06/25/2010  #2 adjuvant chemotherapy with gemcitabine started 08/20/2010 and completed 2 cycles on 12/02/2010.  #3 status post concurrent chemoradiation with Xeloda from 12/15/2010 on 10 01/24/2011.  #4 status post 2 more cycles of adjuvant gemcitabine with gemcitabine completed on 04/04/2011.  #5 FOLFOX giving every 2 weeks status post 12 cycles.  #6 Continuous infusion 5-FU and leucovorin every 2 weeks. Status post 4 cycles discontinued today secondary to disease progression.  #7 The patient was started on 05/03/2012 the first cycle of systemic chemotherapy with gemcitabine 800 mg/M2 in addition to Abraxane 80 mg/M2 on weekly basis for 3 out of every 4 weeks. Status post 6 cycles, discontinued today secondary to disease progression.   CURRENT THERAPY: FOLFIRI (5FU, Leucovorin and Irinotecan) every 2 weeks. First cycle was given on 12/06/2012 . S/P 4 cycles.   INTERVAL HISTORY: Philip Shaw 78 y.o. male returns to the clinic today for followup visit accompanied his wife. The patient is feeling fine today with no specific complaints. He denied having any significant chest pain, shortness of breath, cough or hemoptysis. He has mild fatigue after the last cycle of his systemic chemotherapy. The patient has no significant diarrhea. He has no nausea or vomiting. In general he is tolerating his treatment fairly well. He is here today to start cycle #5 of his chemotherapy.  MEDICAL HISTORY: Past Medical History  Diagnosis Date  . Heart  murmur   . Abdominal pain, periumbilical   . Constipation   . Diabetes mellitus   . Hypertension   . Sleep apnea     pt states he no longer uses his cpap - lost weight  . pancreatic ca dx'd 04/2010    METASTATIC PANCREATIC CA WITH METS TO LIVER AND ABDOMINAL WALL  -chemo every 2 weeks - ONCOLOGIST DR. Caretha Rumbaugh WITH [facility].  Marland Kitchen Port-a-cath in place     RIGHT UPPER CHEST - FOR CHEMO EVERY 2 WEEKS    ALLERGIES:  has No Known Allergies.  MEDICATIONS:  Current Outpatient Prescriptions  Medication Sig Dispense Refill  . Alum & Mag Hydroxide-Simeth (MAGIC MOUTHWASH) SOLN Take 5 mLs by mouth QID.  240 mL  0  . aspirin 81 MG tablet Take 81 mg by mouth every morning.       . gabapentin (NEURONTIN) 100 MG capsule Take 100 mg by mouth 2 (two) times daily.       . insulin glargine (LANTUS) 100 UNIT/ML injection Inject 24 Units into the skin at bedtime.      . lidocaine (XYLOCAINE) 2 % solution TAKE BY MOUTH FOUR TIMES DAILY  240 mL  1  . lidocaine-prilocaine (EMLA) cream Apply 1 application topically as needed.  30 g  2  . mirtazapine (REMERON) 30 MG tablet Take 30 mg by mouth at bedtime.      . ondansetron (ZOFRAN) 8 MG tablet Take 8 mg by mouth every 12 (twelve) hours as needed for nausea.      Marland Kitchen oxyCODONE (OXY IR/ROXICODONE) 5 MG immediate release tablet Take 1 tablet (5 mg total) by mouth  every 4 (four) hours as needed.  30 tablet  0  . oxyCODONE (OXYCONTIN) 10 MG 12 hr tablet Take 10 mg by mouth every 12 (twelve) hours. 02/14/13-pt states he only takes one /day      . ranitidine (ZANTAC) 150 MG tablet Take 150 mg by mouth 2 (two) times daily.       Marland Kitchen terazosin (HYTRIN) 2 MG capsule Take 2 mg by mouth daily.      . tamsulosin (FLOMAX) 0.4 MG CAPS Take 0.4 mg by mouth 2 (two) times daily.      . [DISCONTINUED] metFORMIN (GLUCOPHAGE) 850 MG tablet Take 850 mg by mouth 2 (two) times daily with a meal.        No current facility-administered medications for this visit.     Facility-Administered Medications Ordered in Other Visits  Medication Dose Route Frequency Provider Last Rate Last Dose  . sodium chloride 0.9 % injection 10 mL  10 mL Intracatheter PRN Si Gaul, MD   10 mL at 07/26/12 1715  . sodium chloride 0.9 % injection 10 mL  10 mL Intracatheter PRN Conni Slipper, PA-C   10 mL at 09/20/12 1736    SURGICAL HISTORY:  Past Surgical History  Procedure Laterality Date  . Appendectomy  24-75 years old  . Pancreatectomy  06/2010    with splenectomy  . Inguinal hernia repair  as infant, age 20    LIH- infant, RIH age 77  . Knee arthroscopy  04/23/2012    Procedure: ARTHROSCOPY KNEE;  Surgeon: Loanne Drilling, MD;  Location: [facility];  Service: Orthopedics;  Laterality: Right;  with Debridement  . Splenectomy, total      REVIEW OF SYSTEMS:  A comprehensive review of systems was negative except for: Constitutional: positive for fatigue   PHYSICAL EXAMINATION: General appearance: alert, cooperative and no distress Head: Normocephalic, without obvious abnormality, atraumatic Neck: no adenopathy Lymph nodes: Cervical, supraclavicular, and axillary nodes normal. Resp: clear to auscultation bilaterally Cardio: regular rate and rhythm, S1, S2 normal, no murmur, click, rub or gallop GI: soft, non-tender; bowel sounds normal; no masses,  no organomegaly Extremities: extremities normal, atraumatic, no cyanosis or edema  ECOG PERFORMANCE STATUS: 1 - Symptomatic but completely ambulatory  Blood pressure 114/70, pulse 82, temperature 97.2 F (36.2 C), temperature source Oral, resp. rate 18, height 5\' 7"  (1.702 m), weight 158 lb 12.8 oz (72.031 kg).  LABORATORY DATA: Lab Results  Component Value Date   WBC 14.3* 02/14/2013   HGB 11.0* 02/14/2013   HCT 33.5* 02/14/2013   MCV 85.2 02/14/2013   PLT 324 02/14/2013      Chemistry      Component Value Date/Time   NA 138 01/31/2013 0821   NA 131* 01/09/2013 0500   K 4.4 01/31/2013 0821   K 3.9 01/09/2013 0500    CL 102 01/31/2013 0821   CL 99 01/09/2013 0500   CO2 27 01/31/2013 0821   CO2 28 01/09/2013 0500   BUN 19.2 01/31/2013 0821   BUN 18 01/09/2013 0500   CREATININE 0.8 01/31/2013 0821   CREATININE 0.73 01/09/2013 0500      Component Value Date/Time   CALCIUM 9.0 01/31/2013 0821   CALCIUM 8.8 01/09/2013 0500   ALKPHOS 220* 01/31/2013 0821   ALKPHOS 211* 01/07/2013 2135   AST 27 01/31/2013 0821   AST 45* 01/07/2013 2135   ALT 26 01/31/2013 0821   ALT 38 01/07/2013 2135   BILITOT 0.45 01/31/2013 0821   BILITOT 0.4 01/07/2013 2135  RADIOGRAPHIC STUDIES: No results found.  ASSESSMENT AND PLAN: This is a very pleasant 78 years old white male with metastatic pancreatic adenocarcinoma currently on treatment with FOLFIRI status post 4 cycles. The patient is tolerating his treatment fairly well except for mild fatigue. We'll proceed with cycle #5 today as scheduled. The patient has repeat CT scan of the chest, abdomen and pelvis after cycle #6.  He was given a refill of his pain medication in the form of OxyContin 10 mg by mouth q. 12 hour. He would come back for followup visit in 2 weeks with the next cycle of his chemotherapy.  He was advised to call immediately if he has any concerning symptoms in the interval.  All questions were answered. The patient knows to call the clinic with any problems, questions or concerns. We can certainly see the patient much sooner if necessary.

## 2013-02-14 NOTE — Patient Instructions (Addendum)
[facility] Discharge Instructions for Patients Receiving Chemotherapy  Today you received the following chemotherapy agents FOLFIRI  To help prevent nausea and vomiting after your treatment, we encourage you to take your nausea medication as needed   If you develop nausea and vomiting that is not controlled by your nausea medication, call the clinic.   BELOW ARE SYMPTOMS THAT SHOULD BE REPORTED IMMEDIATELY:  *FEVER GREATER THAN 100.5 F  *CHILLS WITH OR WITHOUT FEVER  NAUSEA AND VOMITING THAT IS NOT CONTROLLED WITH YOUR NAUSEA MEDICATION  *UNUSUAL SHORTNESS OF BREATH  *UNUSUAL BRUISING OR BLEEDING  TENDERNESS IN MOUTH AND THROAT WITH OR WITHOUT PRESENCE OF ULCERS  *URINARY PROBLEMS  *BOWEL PROBLEMS  UNUSUAL RASH Items with * indicate a potential emergency and should be followed up as soon as possible.  Feel free to call the clinic you have any questions or concerns. The clinic phone number is [phone].

## 2013-02-14 NOTE — Patient Instructions (Signed)
Continue chemotherapy today as scheduled.  Follow up visit in 2 weeks. 

## 2013-02-15 ENCOUNTER — Other Ambulatory Visit: Payer: Medicare Other | Admitting: Lab

## 2013-02-16 ENCOUNTER — Ambulatory Visit (HOSPITAL_BASED_OUTPATIENT_CLINIC_OR_DEPARTMENT_OTHER): Payer: Medicare Other

## 2013-02-16 VITALS — HR 80 | Temp 97.5°F | Resp 18

## 2013-02-16 DIAGNOSIS — C259 Malignant neoplasm of pancreas, unspecified: Secondary | ICD-10-CM

## 2013-02-16 DIAGNOSIS — C787 Secondary malignant neoplasm of liver and intrahepatic bile duct: Secondary | ICD-10-CM

## 2013-02-16 DIAGNOSIS — Z452 Encounter for adjustment and management of vascular access device: Secondary | ICD-10-CM

## 2013-02-16 DIAGNOSIS — C50919 Malignant neoplasm of unspecified site of unspecified female breast: Secondary | ICD-10-CM

## 2013-02-16 DIAGNOSIS — C172 Malignant neoplasm of ileum: Secondary | ICD-10-CM

## 2013-02-16 MED ORDER — SODIUM CHLORIDE 0.9 % IJ SOLN
10.0000 mL | INTRAMUSCULAR | Status: DC | PRN
  Administered 2013-02-16: 10 mL
  Filled 2013-02-16: qty 10

## 2013-02-16 MED ORDER — HEPARIN SOD (PORK) LOCK FLUSH 100 UNIT/ML IV SOLN
500.0000 [IU] | Freq: Once | INTRAVENOUS | Status: AC | PRN
  Administered 2013-02-16: 500 [IU]
  Filled 2013-02-16: qty 5

## 2013-02-16 NOTE — Patient Instructions (Signed)
Patient to call MD for problems or concerns

## 2013-02-21 ENCOUNTER — Other Ambulatory Visit: Payer: Medicare Other

## 2013-02-23 ENCOUNTER — Telehealth: Payer: Self-pay | Admitting: *Deleted

## 2013-02-23 NOTE — Telephone Encounter (Signed)
Pt called wanting to delay his lab/ MKM/ chemo x 1 week.  He states he wants more time to rest and has company coming into town.  Onc tx schedule filled out.  Msg routed to Dr Donnald Garre

## 2013-02-24 ENCOUNTER — Telehealth: Payer: Self-pay | Admitting: *Deleted

## 2013-02-24 NOTE — Telephone Encounter (Signed)
sw pt informed him that the only opening for tx on 03/07/13 was @1pm . Pt is aware to come in to have labs@ 11:45, ov@12 :15pm then tx....td

## 2013-02-24 NOTE — Telephone Encounter (Signed)
PEr scheduler and POF I have tried to move appt from 7/7 to 7/14. All I have is 1pm. Appt made scheduler notified. [name]

## 2013-02-24 NOTE — Telephone Encounter (Signed)
sw pt made him aware of his appt change d/t per pof to delay by 1 week 7/7/ appts. gv appts for labs@9 :15am, ov @9 :45am for 03/07/13 . Pt is aware that tx will follow after his tx...Marland KitchenMarland Kitchentd

## 2013-02-28 ENCOUNTER — Other Ambulatory Visit: Payer: Medicare Other

## 2013-02-28 ENCOUNTER — Ambulatory Visit: Payer: Medicare Other | Admitting: Internal Medicine

## 2013-02-28 ENCOUNTER — Inpatient Hospital Stay: Payer: Medicare Other

## 2013-03-03 ENCOUNTER — Telehealth: Payer: Self-pay | Admitting: Internal Medicine

## 2013-03-03 NOTE — Telephone Encounter (Signed)
Per MM due to AJ @ [facility] 7/14 moved pt's to another APP. Moved 7/14 appt to [facility] S/w pt re change and new time for lb/fu/tx 7/14 @ 9:30am. Pt to get new schedule when he comes in.

## 2013-03-07 ENCOUNTER — Encounter: Payer: Self-pay | Admitting: Family

## 2013-03-07 ENCOUNTER — Ambulatory Visit: Payer: Medicare Other | Admitting: Internal Medicine

## 2013-03-07 ENCOUNTER — Ambulatory Visit (HOSPITAL_BASED_OUTPATIENT_CLINIC_OR_DEPARTMENT_OTHER): Payer: Medicare Other

## 2013-03-07 ENCOUNTER — Ambulatory Visit (HOSPITAL_BASED_OUTPATIENT_CLINIC_OR_DEPARTMENT_OTHER): Payer: Medicare Other | Admitting: Lab

## 2013-03-07 ENCOUNTER — Ambulatory Visit (HOSPITAL_BASED_OUTPATIENT_CLINIC_OR_DEPARTMENT_OTHER): Payer: Medicare Other | Admitting: Family

## 2013-03-07 ENCOUNTER — Other Ambulatory Visit: Payer: Medicare Other | Admitting: Lab

## 2013-03-07 VITALS — BP 122/68 | HR 102 | Temp 98.2°F | Resp 20 | Ht 67.0 in | Wt 154.3 lb

## 2013-03-07 DIAGNOSIS — C252 Malignant neoplasm of tail of pancreas: Secondary | ICD-10-CM

## 2013-03-07 DIAGNOSIS — C787 Secondary malignant neoplasm of liver and intrahepatic bile duct: Secondary | ICD-10-CM

## 2013-03-07 DIAGNOSIS — R5383 Other fatigue: Secondary | ICD-10-CM

## 2013-03-07 DIAGNOSIS — C259 Malignant neoplasm of pancreas, unspecified: Secondary | ICD-10-CM

## 2013-03-07 DIAGNOSIS — C779 Secondary and unspecified malignant neoplasm of lymph node, unspecified: Secondary | ICD-10-CM

## 2013-03-07 DIAGNOSIS — Z5111 Encounter for antineoplastic chemotherapy: Secondary | ICD-10-CM

## 2013-03-07 LAB — COMPREHENSIVE METABOLIC PANEL (CC13)
AST: 55 U/L — ABNORMAL HIGH (ref 5–34)
Albumin: 2.8 g/dL — ABNORMAL LOW (ref 3.5–5.0)
Alkaline Phosphatase: 360 U/L — ABNORMAL HIGH (ref 40–150)
BUN: 19.3 mg/dL (ref 7.0–26.0)
Calcium: 9 mg/dL (ref 8.4–10.4)
Chloride: 101 mEq/L (ref 98–109)
Glucose: 108 mg/dl (ref 70–140)
Potassium: 4.4 mEq/L (ref 3.5–5.1)
Sodium: 134 mEq/L — ABNORMAL LOW (ref 136–145)
Total Protein: 6.9 g/dL (ref 6.4–8.3)

## 2013-03-07 LAB — CBC WITH DIFFERENTIAL/PLATELET
Eosinophils Absolute: 0.2 10*3/uL (ref 0.0–0.5)
HCT: 30.5 % — ABNORMAL LOW (ref 38.4–49.9)
LYMPH%: 12.3 % — ABNORMAL LOW (ref 14.0–49.0)
MCHC: 33.1 g/dL (ref 32.0–36.0)
MONO#: 2.3 10*3/uL — ABNORMAL HIGH (ref 0.1–0.9)
NEUT%: 63.9 % (ref 39.0–75.0)
Platelets: 574 10*3/uL — ABNORMAL HIGH (ref 140–400)
WBC: 10.7 10*3/uL — ABNORMAL HIGH (ref 4.0–10.3)

## 2013-03-07 MED ORDER — IRINOTECAN HCL CHEMO INJECTION 100 MG/5ML
180.0000 mg/m2 | Freq: Once | INTRAVENOUS | Status: AC
  Administered 2013-03-07: 334 mg via INTRAVENOUS
  Filled 2013-03-07: qty 16.7

## 2013-03-07 MED ORDER — ONDANSETRON 16 MG/50ML IVPB (CHCC)
16.0000 mg | Freq: Once | INTRAVENOUS | Status: AC
  Administered 2013-03-07: 16 mg via INTRAVENOUS

## 2013-03-07 MED ORDER — DEXAMETHASONE SODIUM PHOSPHATE 20 MG/5ML IJ SOLN
20.0000 mg | Freq: Once | INTRAMUSCULAR | Status: AC
  Administered 2013-03-07: 20 mg via INTRAVENOUS

## 2013-03-07 MED ORDER — FLUOROURACIL CHEMO INJECTION 5 GM/100ML
2400.0000 mg/m2 | INTRAVENOUS | Status: DC
  Administered 2013-03-07: 4450 mg via INTRAVENOUS
  Filled 2013-03-07: qty 89

## 2013-03-07 MED ORDER — ATROPINE SULFATE 1 MG/ML IJ SOLN
0.5000 mg | Freq: Once | INTRAMUSCULAR | Status: AC | PRN
  Administered 2013-03-07: 0.5 mg via INTRAVENOUS

## 2013-03-07 MED ORDER — FLUOROURACIL CHEMO INJECTION 2.5 GM/50ML
400.0000 mg/m2 | Freq: Once | INTRAVENOUS | Status: AC
  Administered 2013-03-07: 750 mg via INTRAVENOUS
  Filled 2013-03-07: qty 15

## 2013-03-07 MED ORDER — LEUCOVORIN CALCIUM INJECTION 350 MG
400.0000 mg/m2 | Freq: Once | INTRAVENOUS | Status: AC
  Administered 2013-03-07: 740 mg via INTRAVENOUS
  Filled 2013-03-07: qty 37

## 2013-03-07 MED ORDER — SODIUM CHLORIDE 0.9 % IV SOLN
Freq: Once | INTRAVENOUS | Status: AC
  Administered 2013-03-07: 13:00:00 via INTRAVENOUS

## 2013-03-07 NOTE — Patient Instructions (Signed)
[facility] Discharge Instructions for Patients Receiving Chemotherapy  Today you received the following chemotherapy agents :  Camptosar, Leucovorin, 5FU.  To help prevent nausea and vomiting after your treatment, we encourage you to take your nausea medication as instructed by your physician.   If you develop nausea and vomiting that is not controlled by your nausea medication, call the clinic.   BELOW ARE SYMPTOMS THAT SHOULD BE REPORTED IMMEDIATELY:  *FEVER GREATER THAN 100.5 F  *CHILLS WITH OR WITHOUT FEVER  NAUSEA AND VOMITING THAT IS NOT CONTROLLED WITH YOUR NAUSEA MEDICATION  *UNUSUAL SHORTNESS OF BREATH  *UNUSUAL BRUISING OR BLEEDING  TENDERNESS IN MOUTH AND THROAT WITH OR WITHOUT PRESENCE OF ULCERS  *URINARY PROBLEMS  *BOWEL PROBLEMS  UNUSUAL RASH Items with * indicate a potential emergency and should be followed up as soon as possible.  Feel free to call the clinic you have any questions or concerns. The clinic phone number is [phone].

## 2013-03-07 NOTE — Progress Notes (Addendum)
[facility] Telephone:([phone]   [phone]) [phone] OFFICE PROGRESS NOTE   ID: Philip Shaw   DOB: 1935/02/10  MRN: [id number]  JYN:[id number]   PCP:  [name], MD Dr. Burnell Blanks [address] [place] [address]   DIAGNOSIS: Metastatic pancreatic adenocarcinoma, now with metastatic disease to the liver and the abdominal wall, initially diagnosed with stage IIIB(T3, N1, M0) pancreatic tail adenocarcinoma in November of 2011.   PRIOR THERAPY:  #1 distal pancreatectomy and splenectomy on 06/25/2010  #2 adjuvant chemotherapy with gemcitabine started 08/20/2010 and completed 2 cycles on 12/02/2010.  #3 status post concurrent chemoradiation with Xeloda from 12/15/2010 on 10 01/24/2011.  #4 status post 2 more cycles of adjuvant gemcitabine with gemcitabine completed on 04/04/2011.  #5 FOLFOX giving every 2 weeks status post 12 cycles.  #6 Continuous infusion 5-FU and leucovorin every 2 weeks. Status post 4 cycles discontinued today secondary to disease progression.  #7 The patient was started on 05/03/2012 the first cycle of systemic chemotherapy with gemcitabine 800 mg/M2 in addition to Abraxane 80 mg/M2 on weekly basis for 3 out of every 4 weeks. Status post 6 cycles, discontinued today secondary to disease progression.   CURRENT THERAPY: FOLFIRI (5FU, Leucovorin and Irinotecan) every 2 weeks. First cycle was given on 12/06/2012 . S/P 5 cycles.   INTERVAL HISTORY: Philip Shaw 78 y.o. male returns to the clinic today for followup visit accompanied [name]. The patient is feeling fine today and states he was in a golf tournament this past weekend.  Philip Shaw reports having several episodes of diarrhea x 7 days after his last chemotherapy cycle.  He continues to have mild fatigue after systemic chemotherapy.  He also reports mild chills followed by feelings of warmth that lasts for 3-4 hours each.  The patient has not taken his  temperature during these episodes to know if he  has a fever.  He denies having any significant chest pain, shortness of breath, cough or hemoptysis. He has no nausea or vomiting.  Philip Shaw denies any other symptomatology including pain. In general he is tolerating his treatments fairly well. He is here today to start cycle #6 of his chemotherapy.   MEDICAL HISTORY: Past Medical History  Diagnosis Date  . Heart murmur   . Abdominal pain, periumbilical   . Constipation   . Diabetes mellitus   . Hypertension   . Sleep apnea     pt states he no longer uses his cpap - lost weight  . pancreatic ca dx'd 04/2010    METASTATIC PANCREATIC CA WITH METS TO LIVER AND ABDOMINAL WALL  -chemo every 2 weeks - ONCOLOGIST DR. MOHAMED MOHAMED WITH [facility].  Marland Kitchen Port-a-cath in place     RIGHT UPPER CHEST - FOR CHEMO EVERY 2 WEEKS    ALLERGIES:  has No Known Allergies.  MEDICATIONS:  Current Outpatient Prescriptions  Medication Sig Dispense Refill  . Alum & Mag Hydroxide-Simeth (MAGIC MOUTHWASH) SOLN Take 5 mLs by mouth QID.  240 mL  0  . aspirin 81 MG tablet Take 81 mg by mouth every morning.       . gabapentin (NEURONTIN) 100 MG capsule Take 100 mg by mouth 2 (two) times daily.       . insulin glargine (LANTUS) 100 UNIT/ML injection Inject 24 Units into the skin at bedtime.      . lidocaine (XYLOCAINE) 2 % solution TAKE BY MOUTH FOUR TIMES  DAILY  240 mL  1  . lidocaine-prilocaine (EMLA) cream Apply 1 application topically as needed.  30 g  2  . mirtazapine (REMERON) 30 MG tablet Take 30 mg by mouth at bedtime.      . ondansetron (ZOFRAN) 8 MG tablet Take 8 mg by mouth every 12 (twelve) hours as needed for nausea.      Marland Kitchen oxyCODONE (OXY IR/ROXICODONE) 5 MG immediate release tablet Take 1 tablet (5 mg total) by mouth every 4 (four) hours as needed.  30 tablet  0  . oxyCODONE (OXYCONTIN) 10 MG 12 hr tablet Take 1 tablet (10 mg total) by mouth every 12 (twelve) hours. 02/14/13-pt states he  only takes one /day  60 tablet  0  . ranitidine (ZANTAC) 150 MG tablet Take 150 mg by mouth 2 (two) times daily.       . tamsulosin (FLOMAX) 0.4 MG CAPS Take 0.4 mg by mouth 2 (two) times daily.      Marland Kitchen terazosin (HYTRIN) 2 MG capsule Take 2 mg by mouth daily.      . [DISCONTINUED] metFORMIN (GLUCOPHAGE) 850 MG tablet Take 850 mg by mouth 2 (two) times daily with a meal.        No current facility-administered medications for this visit.   Facility-Administered Medications Ordered in Other Visits  Medication Dose Route Frequency Provider Last Rate Last Dose  . sodium chloride 0.9 % injection 10 mL  10 mL Intracatheter PRN Si Gaul, MD   10 mL at 07/26/12 1715  . sodium chloride 0.9 % injection 10 mL  10 mL Intracatheter PRN Conni Slipper, PA-C   10 mL at 09/20/12 1736    SURGICAL HISTORY:  Past Surgical History  Procedure Laterality Date  . Appendectomy  18-15 years old  . Pancreatectomy  06/2010    with splenectomy  . Inguinal hernia repair  as infant, age 26    LIH- infant, RIH age 30  . Knee arthroscopy  04/23/2012    Procedure: ARTHROSCOPY KNEE;  Surgeon: Loanne Drilling, MD;  Location: [facility];  Service: Orthopedics;  Laterality: Right;  with Debridement  . Splenectomy, total      REVIEW OF SYSTEMS:  A comprehensive review of systems was negative except for fatigue, diarrhea, chills followed by warmth.  Review of systems is otherwise stable.  PHYSICAL EXAMINATION:  General appearance: Alert, cooperative, well nourished, no apparent distress Head: Normocephalic, without obvious abnormality, atraumatic Eyes: Arcus senilis, PERRLA, EOMI Nose: Nares, septum and mucosa are normal, no drainage or sinus tenderness Neck: No adenopathy, supple, symmetrical, trachea midline, thyroid not enlarged, no tenderness Resp: Clear to auscultation bilaterally Cardio: Regular rate and rhythm, S1, S2 normal, no murmur, click, rub or gallop, right chest Port-A-Cath covered with EMLA cream and  an occlusive bandage GI: Soft, slightly distended, non-tender, hypoactive bowel sounds, no organomegaly Extremities: Extremities normal, atraumatic, no cyanosis or edema Lymph nodes: Cervical, supraclavicular, and axillary nodes normal Neurologic: Grossly normal  ECOG PERFORMANCE STATUS: 1 - Symptomatic but completely ambulatory  Blood pressure 122/68, pulse 102, temperature 98.2 F (36.8 C), temperature source Oral, resp. rate 20, height 5\' 7"  (1.702 m), weight 154 lb 4.8 oz (69.99 kg).  LABORATORY DATA: Lab Results  Component Value Date   WBC 10.7* 03/07/2013   HGB 10.1* 03/07/2013   HCT 30.5* 03/07/2013   MCV 85.7 03/07/2013   PLT 574* 03/07/2013      Chemistry      Component Value Date/Time   NA 138 02/14/2013  0906   NA 131* 01/09/2013 0500   K 4.0 02/14/2013 0906   K 3.9 01/09/2013 0500   CL 103 02/14/2013 0906   CL 99 01/09/2013 0500   CO2 25 02/14/2013 0906   CO2 28 01/09/2013 0500   BUN 11.7 02/14/2013 0906   BUN 18 01/09/2013 0500   CREATININE 0.8 02/14/2013 0906   CREATININE 0.73 01/09/2013 0500      Component Value Date/Time   CALCIUM 9.0 02/14/2013 0906   CALCIUM 8.8 01/09/2013 0500   ALKPHOS 227* 02/14/2013 0906   ALKPHOS 211* 01/07/2013 2135   AST 26 02/14/2013 0906   AST 45* 01/07/2013 2135   ALT 34 02/14/2013 0906   ALT 38 01/07/2013 2135   BILITOT 0.40 02/14/2013 0906   BILITOT 0.4 01/07/2013 2135      RADIOGRAPHIC STUDIES: No results found.   ASSESSMENT AND PLAN:  Philip Shaw  is a very pleasant 78 years old male with metastatic pancreatic adenocarcinoma currently on treatment with FOLFIRI status post 5 cycles. The patient is tolerating his treatments fairly well except for mild fatigue, mild diarrhea during the last chemotherapy cycle, and feelings of chills followed by warmth.  Dr. Arbutus Ped and I recommend that Philip Shaw  proceed with cycle #6  today as scheduled.   We will schedule Philip Shaw for a repeat CT scan of the chest, abdomen and pelvis after the completion of his  current chemotherapy cycle #6.  He is scheduled for followup office visit in 2 weeks to review the results of his repeat CT scans.  We discussed his current symptomatology it in detail including his diet of primarily protein shakes, fruit and popcorn which may also contribute to his loose stools.  We discussed mild exercise (walking) may help with his fatigue.  He was asked to take his temperature if he has another episode of chills followed by warmth and to contact us for a temperature of 100.1 or greater. He was advised to call immediately if he has any other concerning symptoms in the interim.  All questions were answered.  Philip Shaw were encouraged  to call the clinic with any problems, questions or concerns. We can certainly see the patient much sooner if necessary.  Larina Bras, NP-C 03/07/2013  3:42 PM  ADDENDUM: Hematology/oncology attending: I have face to face encounter with the patient today. I recommended his care plan. The patient is feeling fine and tolerating his treatment with FOLFIRI fairly well except for fatigue. He denied having any significant nausea or vomiting. The patient denied having any fever or chills. We'll proceed with cycle #6 today as scheduled. The patient would come back for follow up visit in 2 weeks with repeat CT scan of the chest, abdomen and pelvis for restaging of his disease. Lajuana Matte., MD 03/07/2013

## 2013-03-07 NOTE — Patient Instructions (Addendum)
Please contact us at [phone] if you have any questions or concerns.  Please continue to do well and enjoy life!!!  Get plenty of rest, drink plenty of water (fluids, fluids, fluids), exercise daily (walking), eat a balanced diet.  Check your temperature if you are having chills and/or hot spells.

## 2013-03-09 ENCOUNTER — Other Ambulatory Visit: Payer: Self-pay | Admitting: *Deleted

## 2013-03-09 ENCOUNTER — Ambulatory Visit (HOSPITAL_BASED_OUTPATIENT_CLINIC_OR_DEPARTMENT_OTHER): Payer: Medicare Other

## 2013-03-09 ENCOUNTER — Telehealth: Payer: Self-pay | Admitting: Internal Medicine

## 2013-03-09 VITALS — BP 126/75 | HR 108 | Temp 97.0°F

## 2013-03-09 DIAGNOSIS — C787 Secondary malignant neoplasm of liver and intrahepatic bile duct: Secondary | ICD-10-CM

## 2013-03-09 DIAGNOSIS — C259 Malignant neoplasm of pancreas, unspecified: Secondary | ICD-10-CM

## 2013-03-09 DIAGNOSIS — Z452 Encounter for adjustment and management of vascular access device: Secondary | ICD-10-CM

## 2013-03-09 DIAGNOSIS — C252 Malignant neoplasm of tail of pancreas: Secondary | ICD-10-CM

## 2013-03-09 MED ORDER — SODIUM CHLORIDE 0.9 % IJ SOLN
10.0000 mL | INTRAMUSCULAR | Status: DC | PRN
  Administered 2013-03-09: 10 mL
  Filled 2013-03-09: qty 10

## 2013-03-09 MED ORDER — MIRTAZAPINE 30 MG PO TABS: 30.0000 mg | ORAL_TABLET | Freq: Every day | ORAL | Status: DC

## 2013-03-09 MED ORDER — HEPARIN SOD (PORK) LOCK FLUSH 100 UNIT/ML IV SOLN
500.0000 [IU] | Freq: Once | INTRAVENOUS | Status: AC | PRN
  Administered 2013-03-09: 500 [IU]
  Filled 2013-03-09: qty 5

## 2013-03-09 NOTE — Telephone Encounter (Signed)
Pt stopped by for schedule today. Pt given schedule for July and August. Per MM due to AM [facility] 7/28 he will see pt at 1pm. tx added immediately after f/u.

## 2013-03-11 ENCOUNTER — Ambulatory Visit (HOSPITAL_COMMUNITY)
Admission: RE | Admit: 2013-03-11 | Discharge: 2013-03-11 | Disposition: A | Discharge status: Home / Self Care | Payer: Medicare Other | Source: Ambulatory Visit | Attending: Family | Admitting: Family

## 2013-03-11 DIAGNOSIS — Z923 Personal history of irradiation: Secondary | ICD-10-CM | POA: Insufficient documentation

## 2013-03-11 DIAGNOSIS — Z9221 Personal history of antineoplastic chemotherapy: Secondary | ICD-10-CM | POA: Insufficient documentation

## 2013-03-11 DIAGNOSIS — N289 Disorder of kidney and ureter, unspecified: Secondary | ICD-10-CM | POA: Insufficient documentation

## 2013-03-11 DIAGNOSIS — K6389 Other specified diseases of intestine: Secondary | ICD-10-CM | POA: Insufficient documentation

## 2013-03-11 DIAGNOSIS — C259 Malignant neoplasm of pancreas, unspecified: Secondary | ICD-10-CM | POA: Insufficient documentation

## 2013-03-11 DIAGNOSIS — J90 Pleural effusion, not elsewhere classified: Secondary | ICD-10-CM | POA: Insufficient documentation

## 2013-03-11 DIAGNOSIS — K319 Disease of stomach and duodenum, unspecified: Secondary | ICD-10-CM | POA: Insufficient documentation

## 2013-03-11 DIAGNOSIS — K5730 Diverticulosis of large intestine without perforation or abscess without bleeding: Secondary | ICD-10-CM | POA: Insufficient documentation

## 2013-03-11 DIAGNOSIS — N200 Calculus of kidney: Secondary | ICD-10-CM | POA: Insufficient documentation

## 2013-03-11 DIAGNOSIS — I2510 Atherosclerotic heart disease of native coronary artery without angina pectoris: Secondary | ICD-10-CM | POA: Insufficient documentation

## 2013-03-11 DIAGNOSIS — C787 Secondary malignant neoplasm of liver and intrahepatic bile duct: Secondary | ICD-10-CM | POA: Insufficient documentation

## 2013-03-11 MED ORDER — IOHEXOL 300 MG/ML  SOLN
100.0000 mL | Freq: Once | INTRAMUSCULAR | Status: AC | PRN
  Administered 2013-03-11: 100 mL via INTRAVENOUS

## 2013-03-14 ENCOUNTER — Other Ambulatory Visit: Payer: Medicare Other | Admitting: Lab

## 2013-03-14 ENCOUNTER — Inpatient Hospital Stay: Payer: Medicare Other

## 2013-03-21 ENCOUNTER — Encounter: Payer: Self-pay | Admitting: Internal Medicine

## 2013-03-21 ENCOUNTER — Telehealth: Payer: Self-pay | Admitting: Internal Medicine

## 2013-03-21 ENCOUNTER — Ambulatory Visit (HOSPITAL_BASED_OUTPATIENT_CLINIC_OR_DEPARTMENT_OTHER): Payer: Medicare Other

## 2013-03-21 ENCOUNTER — Other Ambulatory Visit: Payer: Self-pay | Admitting: Internal Medicine

## 2013-03-21 ENCOUNTER — Other Ambulatory Visit (HOSPITAL_BASED_OUTPATIENT_CLINIC_OR_DEPARTMENT_OTHER): Payer: Medicare Other

## 2013-03-21 ENCOUNTER — Ambulatory Visit (HOSPITAL_BASED_OUTPATIENT_CLINIC_OR_DEPARTMENT_OTHER): Payer: Medicare Other | Admitting: Internal Medicine

## 2013-03-21 VITALS — BP 113/69 | HR 111 | Temp 99.3°F | Resp 20 | Ht 67.0 in | Wt 152.9 lb

## 2013-03-21 DIAGNOSIS — C259 Malignant neoplasm of pancreas, unspecified: Secondary | ICD-10-CM

## 2013-03-21 DIAGNOSIS — Z5111 Encounter for antineoplastic chemotherapy: Secondary | ICD-10-CM

## 2013-03-21 DIAGNOSIS — R5383 Other fatigue: Secondary | ICD-10-CM

## 2013-03-21 DIAGNOSIS — R5381 Other malaise: Secondary | ICD-10-CM

## 2013-03-21 DIAGNOSIS — C50919 Malignant neoplasm of unspecified site of unspecified female breast: Secondary | ICD-10-CM

## 2013-03-21 DIAGNOSIS — C252 Malignant neoplasm of tail of pancreas: Secondary | ICD-10-CM

## 2013-03-21 DIAGNOSIS — C779 Secondary and unspecified malignant neoplasm of lymph node, unspecified: Secondary | ICD-10-CM

## 2013-03-21 DIAGNOSIS — R52 Pain, unspecified: Secondary | ICD-10-CM

## 2013-03-21 DIAGNOSIS — C787 Secondary malignant neoplasm of liver and intrahepatic bile duct: Secondary | ICD-10-CM

## 2013-03-21 LAB — COMPREHENSIVE METABOLIC PANEL (CC13)
AST: 28 U/L (ref 5–34)
Albumin: 2.7 g/dL — ABNORMAL LOW (ref 3.5–5.0)
Alkaline Phosphatase: 250 U/L — ABNORMAL HIGH (ref 40–150)
Chloride: 97 mEq/L — ABNORMAL LOW (ref 98–109)
Glucose: 121 mg/dl (ref 70–140)
Potassium: 4.4 mEq/L (ref 3.5–5.1)
Sodium: 132 mEq/L — ABNORMAL LOW (ref 136–145)
Total Protein: 6.9 g/dL (ref 6.4–8.3)

## 2013-03-21 LAB — CBC WITH DIFFERENTIAL/PLATELET
Basophils Absolute: 0 10*3/uL (ref 0.0–0.1)
EOS%: 1.8 % (ref 0.0–7.0)
HGB: 9 g/dL — ABNORMAL LOW (ref 13.0–17.1)
MCH: 29.1 pg (ref 27.2–33.4)
MONO#: 1.6 10*3/uL — ABNORMAL HIGH (ref 0.1–0.9)
NEUT#: 3.7 10*3/uL (ref 1.5–6.5)
RDW: 21.2 % — ABNORMAL HIGH (ref 11.0–14.6)
WBC: 6.5 10*3/uL (ref 4.0–10.3)
lymph#: 1.1 10*3/uL (ref 0.9–3.3)

## 2013-03-21 MED ORDER — HEPARIN SOD (PORK) LOCK FLUSH 100 UNIT/ML IV SOLN
500.0000 [IU] | Freq: Once | INTRAVENOUS | Status: DC | PRN
  Filled 2013-03-21: qty 5

## 2013-03-21 MED ORDER — LEUCOVORIN CALCIUM INJECTION 350 MG
400.0000 mg/m2 | Freq: Once | INTRAVENOUS | Status: AC
  Administered 2013-03-21: 740 mg via INTRAVENOUS
  Filled 2013-03-21: qty 37

## 2013-03-21 MED ORDER — DEXAMETHASONE SODIUM PHOSPHATE 20 MG/5ML IJ SOLN
20.0000 mg | Freq: Once | INTRAMUSCULAR | Status: AC
  Administered 2013-03-21: 20 mg via INTRAVENOUS

## 2013-03-21 MED ORDER — SODIUM CHLORIDE 0.9 % IV SOLN
Freq: Once | INTRAVENOUS | Status: AC
  Administered 2013-03-21: 14:00:00 via INTRAVENOUS

## 2013-03-21 MED ORDER — FLUOROURACIL CHEMO INJECTION 2.5 GM/50ML
400.0000 mg/m2 | Freq: Once | INTRAVENOUS | Status: AC
  Administered 2013-03-21: 750 mg via INTRAVENOUS
  Filled 2013-03-21: qty 15

## 2013-03-21 MED ORDER — IRINOTECAN HCL CHEMO INJECTION 100 MG/5ML
160.0000 mg/m2 | Freq: Once | INTRAVENOUS | Status: AC
  Administered 2013-03-21: 298 mg via INTRAVENOUS
  Filled 2013-03-21: qty 14.9

## 2013-03-21 MED ORDER — SODIUM CHLORIDE 0.9 % IV SOLN
2000.0000 mg/m2 | INTRAVENOUS | Status: DC
  Administered 2013-03-21: 3700 mg via INTRAVENOUS
  Filled 2013-03-21: qty 74

## 2013-03-21 MED ORDER — SODIUM CHLORIDE 0.9 % IJ SOLN
10.0000 mL | INTRAMUSCULAR | Status: DC | PRN
  Filled 2013-03-21: qty 10

## 2013-03-21 MED ORDER — ONDANSETRON 16 MG/50ML IVPB (CHCC)
16.0000 mg | Freq: Once | INTRAVENOUS | Status: AC
  Administered 2013-03-21: 16 mg via INTRAVENOUS

## 2013-03-21 MED ORDER — ATROPINE SULFATE 1 MG/ML IJ SOLN
0.5000 mg | Freq: Once | INTRAMUSCULAR | Status: AC | PRN
  Administered 2013-03-21: 0.5 mg via INTRAVENOUS

## 2013-03-21 NOTE — Telephone Encounter (Signed)
Gave pt appt for lab,MD and chemo for july and august 2014

## 2013-03-21 NOTE — Patient Instructions (Addendum)
Your Scan showed slight disease progression in the liver lesion. Continue chemotherapy as scheduled. Followup visit in 2 weeks

## 2013-03-21 NOTE — Telephone Encounter (Signed)
Per staff message and POF I have scheduled appts.  [name]  

## 2013-03-21 NOTE — Patient Instructions (Signed)
[facility] Discharge Instructions for Patients Receiving Chemotherapy  Today you received the following chemotherapy agents: irinotecan, leucovorin, 73fu, 63fu pump.    To help prevent nausea and vomiting after your treatment, we encourage you to take your nausea medication.  Take it as often as prescribed.     If you develop nausea and vomiting that is not controlled by your nausea medication, call the clinic. If it is after clinic hours your family physician or the after hours number for the clinic or go to the Emergency Department.   BELOW ARE SYMPTOMS THAT SHOULD BE REPORTED IMMEDIATELY:  *FEVER GREATER THAN 100.5 F  *CHILLS WITH OR WITHOUT FEVER  NAUSEA AND VOMITING THAT IS NOT CONTROLLED WITH YOUR NAUSEA MEDICATION  *UNUSUAL SHORTNESS OF BREATH  *UNUSUAL BRUISING OR BLEEDING  TENDERNESS IN MOUTH AND THROAT WITH OR WITHOUT PRESENCE OF ULCERS  *URINARY PROBLEMS  *BOWEL PROBLEMS  UNUSUAL RASH Items with * indicate a potential emergency and should be followed up as soon as possible.  Feel free to call the clinic you have any questions or concerns. The clinic phone number is [phone].   I have been informed and understand all the instructions given to me. I know to contact the clinic, my physician, or go to the Emergency Department if any problems should occur. I do not have any questions at this time, but understand that I may call the clinic during office hours   should I have any questions or need assistance in obtaining follow up care.    __________________________________________  _____________  __________ Signature of Patient or Authorized Representative            Date                   Time    __________________________________________ Nurse's Signature

## 2013-03-21 NOTE — Telephone Encounter (Signed)
gave pt appt for lab,chemo and MD for July and August 2014

## 2013-03-21 NOTE — Progress Notes (Signed)
[facility] [phone]   [phone]  OFFICE PROGRESS NOTE  [name], MD Dr. Sharman Crate Hamrick [address] [place] [address]  DIAGNOSIS: Metastatic pancreatic adenocarcinoma, now with metastatic disease to the liver and the abdominal wall, initially diagnosed with stage IIIB(T3, N1, M0) pancreatic tail adenocarcinoma in November of 2011.   PRIOR THERAPY:  #1 distal pancreatectomy and splenectomy on 06/25/2010  #2 adjuvant chemotherapy with gemcitabine started 08/20/2010 and completed 2 cycles on 12/02/2010.  #3 status post concurrent chemoradiation with Xeloda from 12/15/2010 on 10 01/24/2011.  #4 status post 2 more cycles of adjuvant gemcitabine with gemcitabine completed on 04/04/2011.  #5 FOLFOX giving every 2 weeks status post 12 cycles.  #6 Continuous infusion 5-FU and leucovorin every 2 weeks. Status post 4 cycles discontinued today secondary to disease progression.  #7 The patient was started on 05/03/2012 the first cycle of systemic chemotherapy with gemcitabine 800 mg/M2 in addition to Abraxane 80 mg/M2 on weekly basis for 3 out of every 4 weeks. Status post 6 cycles, discontinued today secondary to disease progression.   CURRENT THERAPY: FOLFIRI (5FU, Leucovorin and Irinotecan) every 2 weeks. First cycle was given on 12/06/2012 . S/P 6 cycles.  DISEASE STAGE: Metastatic pancreatic adenocarcinoma, now with metastatic disease to the liver and the abdominal wall, initially diagnosed with stage IIIB(T3, N1, M0) pancreatic tail adenocarcinoma in November of 2011.   CHEMOTHERAPY INTENT: Palliative  CURRENT # OF CHEMOTHERAPY CYCLES: 7  CURRENT ANTIEMETICS: Zofran, Decadron and Compazine  CURRENT SMOKING STATUS: Nonsmoker  ORAL CHEMOTHERAPY AND CONSENT: None  CURRENT BISPHOSPHONATES USE: None  PAIN MANAGEMENT: Well-tolerated with OxyContin and oxycodone  NARCOTICS INDUCED CONSTIPATION: Over-the-counter stool  softner  LIVING WILL AND CODE STATUS: Full code  INTERVAL HISTORY: FABRIZZIO MARCELLA 78 y.o. male returns to the clinic today for followup visit accompanied by his wife and daughter. The patient is feeling fine today with no specific complaints except for generalized fatigue and weakness. The patient mentions that it takes him longer time to recover after every chemotherapy cycle. He denied having any significant nausea or vomiting. He denied having any fever or chills. The patient has no significant chest pain, cough, shortness of breath or hemoptysis. He continues to have mild pain at the right upper quadrant worse with coughing. He denied having any significant weight loss or night sweats. The patient had repeat CT scan of the chest, abdomen and pelvis performed recently and he is here for evaluation and discussion of his scan results.   MEDICAL HISTORY: Past Medical History  Diagnosis Date  . Heart murmur   . Abdominal pain, periumbilical   . Constipation   . Diabetes mellitus   . Hypertension   . Sleep apnea     pt states he no longer uses his cpap - lost weight  . pancreatic ca dx'd 04/2010    METASTATIC PANCREATIC CA WITH METS TO LIVER AND ABDOMINAL WALL  -chemo every 2 weeks - ONCOLOGIST DR. Jacqulin Brandenburger WITH [facility].  Marland Kitchen Port-a-cath in place     RIGHT UPPER CHEST - FOR CHEMO EVERY 2 WEEKS    ALLERGIES:  has No Known Allergies.  MEDICATIONS:  Current Outpatient Prescriptions  Medication Sig Dispense Refill  . Alum & Mag Hydroxide-Simeth (MAGIC MOUTHWASH) SOLN Take 5 mLs by mouth QID.  240 mL  0  . aspirin 81 MG tablet Take 81 mg by mouth every morning.       Marland Kitchen  gabapentin (NEURONTIN) 100 MG capsule Take 100 mg by mouth 2 (two) times daily.       . insulin glargine (LANTUS) 100 UNIT/ML injection Inject 24 Units into the skin at bedtime.      . lidocaine (XYLOCAINE) 2 % solution TAKE BY MOUTH FOUR TIMES DAILY  240 mL  1  . lidocaine-prilocaine (EMLA) cream  Apply 1 application topically as needed.  30 g  2  . mirtazapine (REMERON) 30 MG tablet Take 1 tablet (30 mg total) by mouth at bedtime.  30 tablet  0  . ondansetron (ZOFRAN) 8 MG tablet Take 8 mg by mouth every 12 (twelve) hours as needed for nausea.      Marland Kitchen oxyCODONE (OXY IR/ROXICODONE) 5 MG immediate release tablet Take 1 tablet (5 mg total) by mouth every 4 (four) hours as needed.  30 tablet  0  . oxyCODONE (OXYCONTIN) 10 MG 12 hr tablet Take 1 tablet (10 mg total) by mouth every 12 (twelve) hours. 02/14/13-pt states he only takes one /day  60 tablet  0  . ranitidine (ZANTAC) 150 MG tablet Take 150 mg by mouth 2 (two) times daily.       . tamsulosin (FLOMAX) 0.4 MG CAPS Take 0.4 mg by mouth 2 (two) times daily.      Marland Kitchen terazosin (HYTRIN) 2 MG capsule Take 2 mg by mouth daily.      . [DISCONTINUED] metFORMIN (GLUCOPHAGE) 850 MG tablet Take 850 mg by mouth 2 (two) times daily with a meal.        No current facility-administered medications for this visit.   Facility-Administered Medications Ordered in Other Visits  Medication Dose Route Frequency Provider Last Rate Last Dose  . sodium chloride 0.9 % injection 10 mL  10 mL Intracatheter PRN Si Gaul, MD   10 mL at 07/26/12 1715  . sodium chloride 0.9 % injection 10 mL  10 mL Intracatheter PRN Conni Slipper, PA-C   10 mL at 09/20/12 1736    SURGICAL HISTORY:  Past Surgical History  Procedure Laterality Date  . Appendectomy  84-49 years old  . Pancreatectomy  06/2010    with splenectomy  . Inguinal hernia repair  as infant, age 59    LIH- infant, RIH age 61  . Knee arthroscopy  04/23/2012    Procedure: ARTHROSCOPY KNEE;  Surgeon: Loanne Drilling, MD;  Location: [facility];  Service: Orthopedics;  Laterality: Right;  with Debridement  . Splenectomy, total      REVIEW OF SYSTEMS:  A comprehensive review of systems was negative except for: Constitutional: positive for fatigue   PHYSICAL EXAMINATION: General appearance: alert, cooperative,  fatigued and no distress Head: Normocephalic, without obvious abnormality, atraumatic Neck: no adenopathy Lymph nodes: Cervical, supraclavicular, and axillary nodes normal. Resp: clear to auscultation bilaterally Cardio: regular rate and rhythm, S1, S2 normal, no murmur, click, rub or gallop GI: soft, non-tender; bowel sounds normal; no masses,  no organomegaly Extremities: extremities normal, atraumatic, no cyanosis or edema Neurologic: Alert and oriented X 3, normal strength and tone. Normal symmetric reflexes. Normal coordination and gait  ECOG PERFORMANCE STATUS: 1 - Symptomatic but completely ambulatory  Blood pressure 113/69, pulse 111, temperature 99.3 F (37.4 C), temperature source Oral, resp. rate 20, height 5\' 7"  (1.702 m), weight 152 lb 14.4 oz (69.355 kg).  LABORATORY DATA: Lab Results  Component Value Date   WBC 6.5 03/21/2013   HGB 9.0* 03/21/2013   HCT 27.1* 03/21/2013   MCV 87.7 03/21/2013  PLT 385 03/21/2013      Chemistry      Component Value Date/Time   NA 134* 03/07/2013 0948   NA 131* 01/09/2013 0500   K 4.4 03/07/2013 0948   K 3.9 01/09/2013 0500   CL 103 02/14/2013 0906   CL 99 01/09/2013 0500   CO2 25 03/07/2013 0948   CO2 28 01/09/2013 0500   BUN 19.3 03/07/2013 0948   BUN 18 01/09/2013 0500   CREATININE 0.8 03/07/2013 0948   CREATININE 0.73 01/09/2013 0500      Component Value Date/Time   CALCIUM 9.0 03/07/2013 0948   CALCIUM 8.8 01/09/2013 0500   ALKPHOS 360* 03/07/2013 0948   ALKPHOS 211* 01/07/2013 2135   AST 55* 03/07/2013 0948   AST 45* 01/07/2013 2135   ALT 77* 03/07/2013 0948   ALT 38 01/07/2013 2135   BILITOT 0.87 03/07/2013 0948   BILITOT 0.4 01/07/2013 2135       RADIOGRAPHIC STUDIES: Ct Chest W Contrast  03/11/2013   *RADIOLOGY REPORT*  Clinical Data:  Pancreatic cancer.  Ongoing chemotherapy.  40% pancreatectomy.  Completed radiotherapy.  CT CHEST, ABDOMEN AND PELVIS WITH CONTRAST  Technique:  Multidetector CT imaging of the chest, abdomen and  pelvis was performed following the standard protocol during bolus administration of intravenous contrast.  Contrast: OMNIPAQUE IOHEXOL 300 MG/ML  SOLN  Comparison:  Multiple exams, including 11/08/2012  \  CT CHEST  Findings:  Right IJ line tip:  SVC.  Moderate sized right pleural effusion noted, likely exudative - I cannot exclude malignant effusion given the slightly nodular appearance along the right hemidiaphragmatic margin posteriorly. There is subtle nodularity along the right-sided fissures.  No pathologic thoracic adenopathy.  Atherosclerotic calcification the patient in the proximal left anterior descending coronary artery noted.    Small calcified granuloma noted in the left lower lobe posteriorly.  IMPRESSION:  1.  New moderate right sided pleural effusion with subtle nodularity along the right hemidiaphragm and along the fissures - I cannot exclude a malignant pleural effusion. 2.  Atherosclerosis.    CT ABDOMEN AND PELVIS  Findings:  Heterogeneous enhancing mass in segment two of the liver, 3.8 x 3.8 cm, formerly 3.2 x 3.4 cm. Stable pneumobilia. Contracted gallbladder.  Spleen absent.  Distal pancreatectomy noted with scattered calcifications in dilated duct in the remaining segment of the pancreatic body and head, along with severe parenchymal atrophy.  Distended stomach.  Spleen absent.  Continued infiltration of the left omentum with stranding around lobulations of adipose tissue, query fat necrosis or tumor infiltration.  Adrenal glands unremarkable.  Hypodense renal lesions noted; some of these including the 1.0 x 0.7 cm right kidney upper pole lesion on image 63 of series 2 and the bilobed 2.7 x 1.6 cm lesion in the right kidney lower pole have complex elements.  A 3 mm right kidney upper pole nonobstructive calculus.  4 mm and separate 6 mm right kidney lower pole nonobstructive calculus. Left kidney lower pole 3 mm nonobstructive calculus.  No hydronephrosis or hydroureter.  Urinary  bladder unremarkable. Prominent central prostate gland indents the bladder base.  Soft tissue density/scarring along the anterior renal fascia/anterior pararenal space is stable.  No dilated small bowel.  Descending and sigmoid colon diverticulosis noted with wall thickening in the proximal sigmoid colon.  Aortoiliac atherosclerotic calcification is present.  IMPRESSION:  1.  Enlarging metastatic lesion in segment two of the liver. 2.  Essentially stable appearance of stranding/infiltrative process in the left omentum, and soft  tissue density along the anterior renal fascia/anterior para renal space.  Some of this may be postoperative fat necrosis and scarring, although monitoring is likely warranted. 3.  Bilateral hypodense renal lesions.  Some of the left renal lesions are complex.  If the patient has hematuria or if further workup is warranted in light of the patient's overall clinical situation, renal protocol MRI of the abdomen with and without contrast would be the best way work these out to exclude small foci of renal cell carcinoma. 4.  Focal wall thickening in the proximal sigmoid colon could be related to the diverticulosis, but I cannot exclude a sigmoid colon malignancy.  Consider colonoscopy if feasible and if not recently performed. 5.  Bilateral nonobstructive nephrolithiasis. 6.  Gastric distention.   Original Report Authenticated By: Gaylyn Rong, M.D.     ASSESSMENT AND PLAN: This is a very pleasant 78 years old white male with metastatic pancreatic adenocarcinoma currently on systemic chemotherapy with FOLFIRI status post 6 cycles. The patient is doing fine except for the chemotherapy-induced fatigue and weakness which is taking him more time to recover. His recent scan showed stable disease except for slightly enlarging metastatic lesion in the segment two of the liver. I have a lengthy discussion with the patient and his family today about his condition and the scan results. In the  absence of any other good treatment options and the presence of metastatic disease., I recommended for the patient to continue his current treatment plan with FOLFIRI. Because of his increasing fatigue and weakness, I would reduce his dose of the chemotherapy. The patient to start cycle #7 today.  He would come back for followup visit in 2 weeks with the next cycle of his chemotherapy. For pain management he'll continue on OxyContin and oxycodone. The patient was advised to call immediately if he has any concerning symptoms in the interval. The patient voices understanding of current disease status and treatment options and is in agreement with the current care plan.  All questions were answered. The patient knows to call the clinic with any problems, questions or concerns. We can certainly see the patient much sooner if necessary.  I spent 15 minutes counseling the patient face to face. The total time spent in the appointment was 25 minutes.

## 2013-03-23 ENCOUNTER — Ambulatory Visit (HOSPITAL_BASED_OUTPATIENT_CLINIC_OR_DEPARTMENT_OTHER): Payer: Medicare Other

## 2013-03-23 VITALS — BP 128/83 | HR 110 | Temp 98.0°F

## 2013-03-23 DIAGNOSIS — C259 Malignant neoplasm of pancreas, unspecified: Secondary | ICD-10-CM

## 2013-03-23 MED ORDER — HEPARIN SOD (PORK) LOCK FLUSH 100 UNIT/ML IV SOLN
500.0000 [IU] | Freq: Once | INTRAVENOUS | Status: AC | PRN
  Administered 2013-03-23: 500 [IU]
  Filled 2013-03-23: qty 5

## 2013-03-23 MED ORDER — SODIUM CHLORIDE 0.9 % IJ SOLN
10.0000 mL | INTRAMUSCULAR | Status: DC | PRN
  Administered 2013-03-23: 10 mL
  Filled 2013-03-23: qty 10

## 2013-03-30 ENCOUNTER — Other Ambulatory Visit: Payer: Self-pay

## 2013-04-04 ENCOUNTER — Ambulatory Visit (HOSPITAL_BASED_OUTPATIENT_CLINIC_OR_DEPARTMENT_OTHER): Payer: Medicare Other | Admitting: Physician Assistant

## 2013-04-04 ENCOUNTER — Other Ambulatory Visit (HOSPITAL_BASED_OUTPATIENT_CLINIC_OR_DEPARTMENT_OTHER): Payer: Medicare Other | Admitting: Lab

## 2013-04-04 ENCOUNTER — Telehealth: Payer: Self-pay | Admitting: Internal Medicine

## 2013-04-04 ENCOUNTER — Ambulatory Visit: Payer: Medicare Other

## 2013-04-04 ENCOUNTER — Encounter: Payer: Self-pay | Admitting: Physician Assistant

## 2013-04-04 ENCOUNTER — Telehealth: Payer: Self-pay | Admitting: *Deleted

## 2013-04-04 VITALS — BP 111/67 | HR 112 | Temp 98.1°F | Resp 18 | Ht 67.0 in | Wt 152.2 lb

## 2013-04-04 DIAGNOSIS — C787 Secondary malignant neoplasm of liver and intrahepatic bile duct: Secondary | ICD-10-CM

## 2013-04-04 DIAGNOSIS — R5381 Other malaise: Secondary | ICD-10-CM

## 2013-04-04 DIAGNOSIS — C252 Malignant neoplasm of tail of pancreas: Secondary | ICD-10-CM

## 2013-04-04 DIAGNOSIS — R52 Pain, unspecified: Secondary | ICD-10-CM

## 2013-04-04 DIAGNOSIS — C50919 Malignant neoplasm of unspecified site of unspecified female breast: Secondary | ICD-10-CM

## 2013-04-04 DIAGNOSIS — C259 Malignant neoplasm of pancreas, unspecified: Secondary | ICD-10-CM

## 2013-04-04 LAB — CBC WITH DIFFERENTIAL/PLATELET
BASO%: 0.6 % (ref 0.0–2.0)
Eosinophils Absolute: 0.1 10*3/uL (ref 0.0–0.5)
MCHC: 33 g/dL (ref 32.0–36.0)
MONO#: 1.6 10*3/uL — ABNORMAL HIGH (ref 0.1–0.9)
NEUT#: 0.7 10*3/uL — ABNORMAL LOW (ref 1.5–6.5)
Platelets: 430 10*3/uL — ABNORMAL HIGH (ref 140–400)
RBC: 2.94 10*6/uL — ABNORMAL LOW (ref 4.20–5.82)
RDW: 20.9 % — ABNORMAL HIGH (ref 11.0–14.6)
WBC: 3.5 10*3/uL — ABNORMAL LOW (ref 4.0–10.3)
lymph#: 1 10*3/uL (ref 0.9–3.3)

## 2013-04-04 MED ORDER — ONDANSETRON HCL 8 MG PO TABS: 8.0000 mg | ORAL_TABLET | Freq: Two times a day (BID) | ORAL | Status: DC | PRN

## 2013-04-04 MED ORDER — OXYCODONE HCL 10 MG PO TB12: 10.0000 mg | ORAL_TABLET | Freq: Two times a day (BID) | ORAL | Status: AC

## 2013-04-04 NOTE — Progress Notes (Addendum)
[facility] [phone]   [phone]  SHARED VISIT PROGRESS NOTE  [name], MD Dr. Sharman Crate Hamrick [address] [place] [address]  DIAGNOSIS: Metastatic pancreatic adenocarcinoma, now with metastatic disease to the liver and the abdominal wall, initially diagnosed with stage IIIB(T3, N1, M0) pancreatic tail adenocarcinoma in November of 2011.   PRIOR THERAPY:  #1 distal pancreatectomy and splenectomy on 06/25/2010  #2 adjuvant chemotherapy with gemcitabine started 08/20/2010 and completed 2 cycles on 12/02/2010.  #3 status post concurrent chemoradiation with Xeloda from 12/15/2010 on 10 01/24/2011.  #4 status post 2 more cycles of adjuvant gemcitabine with gemcitabine completed on 04/04/2011.  #5 FOLFOX giving every 2 weeks status post 12 cycles.  #6 Continuous infusion 5-FU and leucovorin every 2 weeks. Status post 4 cycles discontinued today secondary to disease progression.  #7 The patient was started on 05/03/2012 the first cycle of systemic chemotherapy with gemcitabine 800 mg/M2 in addition to Abraxane 80 mg/M2 on weekly basis for 3 out of every 4 weeks. Status post 6 cycles, discontinued today secondary to disease progression.   CURRENT THERAPY: FOLFIRI (5FU, Leucovorin and Irinotecan) every 2 weeks. First cycle was given on 12/06/2012 . S/P 7 cycles.  DISEASE STAGE: Metastatic pancreatic adenocarcinoma, now with metastatic disease to the liver and the abdominal wall, initially diagnosed with stage IIIB(T3, N1, M0) pancreatic tail adenocarcinoma in November of 2011.   CHEMOTHERAPY INTENT: Palliative  CURRENT # OF CHEMOTHERAPY CYCLES: 7  CURRENT ANTIEMETICS: Zofran, Decadron and Compazine  CURRENT SMOKING STATUS: Nonsmoker  ORAL CHEMOTHERAPY AND CONSENT: None  CURRENT BISPHOSPHONATES USE: None  PAIN MANAGEMENT: Well-tolerated with OxyContin and oxycodone  NARCOTICS INDUCED CONSTIPATION: Over-the-counter stool  softner  LIVING WILL AND CODE STATUS: Full code  INTERVAL HISTORY: Philip Shaw 78 y.o. male returns to the clinic today for followup visit accompanied by his wife.  The patient is feeling fine today with no specific complaints except for generalized fatigue, weakness and occasional low back pain after standing or sitting for long periods of time. He reports about a two-hour time span where he was "shivering". He feels that this is because he "didn't too much" earlier that morning. He did not check his temperature. He continues to ingest 8-9 protein shakes daily. His wife makes these with cold milk, cream and fruit and protein powder. He requests refills for his Zofran his OxyContin and he thinks he also needs a refill for his Remeron. He denied having any significant nausea or vomiting. He denied having any fever. The patient has no significant chest pain, cough, shortness of breath or hemoptysis. He continues to have mild pain at the right upper quadrant worse with coughing. He denied having any significant weight loss or night sweats.   MEDICAL HISTORY: Past Medical History  Diagnosis Date  . Heart murmur   . Abdominal pain, periumbilical   . Constipation   . Diabetes mellitus   . Hypertension   . Sleep apnea     pt states he no longer uses his cpap - lost weight  . pancreatic ca dx'd 04/2010    METASTATIC PANCREATIC CA WITH METS TO LIVER AND ABDOMINAL WALL  -chemo every 2 weeks - ONCOLOGIST DR. MOHAMED MOHAMED WITH [facility].  Marland Kitchen Port-a-cath in place     RIGHT UPPER CHEST - FOR CHEMO EVERY 2 WEEKS    ALLERGIES:  has No Known Allergies.  MEDICATIONS:  Current Outpatient Prescriptions  Medication Sig Dispense Refill  .  Alum & Mag Hydroxide-Simeth (MAGIC MOUTHWASH) SOLN Take 5 mLs by mouth QID.  240 mL  0  . aspirin 81 MG tablet Take 81 mg by mouth every morning.       . gabapentin (NEURONTIN) 100 MG capsule Take 100 mg by mouth 2 (two) times daily.       . insulin  glargine (LANTUS) 100 UNIT/ML injection Inject 24 Units into the skin at bedtime.      . lidocaine (XYLOCAINE) 2 % solution TAKE BY MOUTH FOUR TIMES DAILY  240 mL  1  . lidocaine-prilocaine (EMLA) cream Apply 1 application topically as needed.  30 g  2  . mirtazapine (REMERON) 30 MG tablet TAKE 1 TABLET BY MOUTH EVERY NIGHT AT BEDTIME  30 tablet  2  . ondansetron (ZOFRAN) 8 MG tablet Take 1 tablet (8 mg total) by mouth every 12 (twelve) hours as needed for nausea.  20 tablet  3  . oxyCODONE (OXY IR/ROXICODONE) 5 MG immediate release tablet Take 1 tablet (5 mg total) by mouth every 4 (four) hours as needed.  30 tablet  0  . oxyCODONE (OXYCONTIN) 10 MG 12 hr tablet Take 1 tablet (10 mg total) by mouth every 12 (twelve) hours. 02/14/13-pt states he only takes one /day  60 tablet  0  . ranitidine (ZANTAC) 150 MG tablet Take 150 mg by mouth 2 (two) times daily.       . tamsulosin (FLOMAX) 0.4 MG CAPS Take 0.4 mg by mouth 2 (two) times daily.      . [DISCONTINUED] metFORMIN (GLUCOPHAGE) 850 MG tablet Take 850 mg by mouth 2 (two) times daily with a meal.        No current facility-administered medications for this visit.   Facility-Administered Medications Ordered in Other Visits  Medication Dose Route Frequency Provider Last Rate Last Dose  . sodium chloride 0.9 % injection 10 mL  10 mL Intracatheter PRN Si Gaul, MD   10 mL at 07/26/12 1715  . sodium chloride 0.9 % injection 10 mL  10 mL Intracatheter PRN Conni Slipper, PA-C   10 mL at 09/20/12 1736    SURGICAL HISTORY:  Past Surgical History  Procedure Laterality Date  . Appendectomy  66-79 years old  . Pancreatectomy  06/2010    with splenectomy  . Inguinal hernia repair  as infant, age 37    LIH- infant, RIH age 36  . Knee arthroscopy  04/23/2012    Procedure: ARTHROSCOPY KNEE;  Surgeon: Loanne Drilling, MD;  Location: [facility];  Service: Orthopedics;  Laterality: Right;  with Debridement  . Splenectomy, total      REVIEW OF  SYSTEMS:  A comprehensive review of systems was negative except for: Constitutional: positive for fatigue   PHYSICAL EXAMINATION: General appearance: alert, cooperative, fatigued and no distress Head: Normocephalic, without obvious abnormality, atraumatic Neck: no adenopathy Lymph nodes: Cervical, supraclavicular, and axillary nodes normal. Resp: clear to auscultation bilaterally Cardio: regular rate and rhythm, S1, S2 normal, no murmur, click, rub or gallop GI: soft, non-tender; bowel sounds normal; no masses,  no organomegaly Extremities: extremities normal, atraumatic, no cyanosis or edema Neurologic: Alert and oriented X 3, normal strength and tone. Normal symmetric reflexes. Normal coordination and gait  ECOG PERFORMANCE STATUS: 1 - Symptomatic but completely ambulatory  Blood pressure 111/67, pulse 112, temperature 98.1 F (36.7 C), temperature source Oral, resp. rate 18, height 5\' 7"  (1.702 m), weight 152 lb 3.2 oz (69.037 kg).  LABORATORY DATA: Lab Results  Component Value Date   WBC 3.5* 04/04/2013   HGB 8.7* 04/04/2013   HCT 26.4* 04/04/2013   MCV 89.8 04/04/2013   PLT 430* 04/04/2013      Chemistry      Component Value Date/Time   NA 132* 03/21/2013 1225   NA 131* 01/09/2013 0500   K 4.4 03/21/2013 1225   K 3.9 01/09/2013 0500   CL 103 02/14/2013 0906   CL 99 01/09/2013 0500   CO2 25 03/21/2013 1225   CO2 28 01/09/2013 0500   BUN 21.6 03/21/2013 1225   BUN 18 01/09/2013 0500   CREATININE 0.8 03/21/2013 1225   CREATININE 0.73 01/09/2013 0500      Component Value Date/Time   CALCIUM 9.0 03/21/2013 1225   CALCIUM 8.8 01/09/2013 0500   ALKPHOS 250* 03/21/2013 1225   ALKPHOS 211* 01/07/2013 2135   AST 28 03/21/2013 1225   AST 45* 01/07/2013 2135   ALT 31 03/21/2013 1225   ALT 38 01/07/2013 2135   BILITOT 1.12 03/21/2013 1225   BILITOT 0.4 01/07/2013 2135       RADIOGRAPHIC STUDIES: Ct Chest W Contrast  03/11/2013   *RADIOLOGY REPORT*  Clinical Data:  Pancreatic cancer.  Ongoing  chemotherapy.  40% pancreatectomy.  Completed radiotherapy.  CT CHEST, ABDOMEN AND PELVIS WITH CONTRAST  Technique:  Multidetector CT imaging of the chest, abdomen and pelvis was performed following the standard protocol during bolus administration of intravenous contrast.  Contrast: OMNIPAQUE IOHEXOL 300 MG/ML  SOLN  Comparison:  Multiple exams, including 11/08/2012  \  CT CHEST  Findings:  Right IJ line tip:  SVC.  Moderate sized right pleural effusion noted, likely exudative - I cannot exclude malignant effusion given the slightly nodular appearance along the right hemidiaphragmatic margin posteriorly. There is subtle nodularity along the right-sided fissures.  No pathologic thoracic adenopathy.  Atherosclerotic calcification the patient in the proximal left anterior descending coronary artery noted.    Small calcified granuloma noted in the left lower lobe posteriorly.  IMPRESSION:  1.  New moderate right sided pleural effusion with subtle nodularity along the right hemidiaphragm and along the fissures - I cannot exclude a malignant pleural effusion. 2.  Atherosclerosis.    CT ABDOMEN AND PELVIS  Findings:  Heterogeneous enhancing mass in segment two of the liver, 3.8 x 3.8 cm, formerly 3.2 x 3.4 cm. Stable pneumobilia. Contracted gallbladder.  Spleen absent.  Distal pancreatectomy noted with scattered calcifications in dilated duct in the remaining segment of the pancreatic body and head, along with severe parenchymal atrophy.  Distended stomach.  Spleen absent.  Continued infiltration of the left omentum with stranding around lobulations of adipose tissue, query fat necrosis or tumor infiltration.  Adrenal glands unremarkable.  Hypodense renal lesions noted; some of these including the 1.0 x 0.7 cm right kidney upper pole lesion on image 63 of series 2 and the bilobed 2.7 x 1.6 cm lesion in the right kidney lower pole have complex elements.  A 3 mm right kidney upper pole nonobstructive calculus.  4  mm and separate 6 mm right kidney lower pole nonobstructive calculus. Left kidney lower pole 3 mm nonobstructive calculus.  No hydronephrosis or hydroureter.  Urinary bladder unremarkable. Prominent central prostate gland indents the bladder base.  Soft tissue density/scarring along the anterior renal fascia/anterior pararenal space is stable.  No dilated small bowel.  Descending and sigmoid colon diverticulosis noted with wall thickening in the proximal sigmoid colon.  Aortoiliac atherosclerotic calcification is present.  IMPRESSION:  1.  Enlarging metastatic lesion in segment two of the liver. 2.  Essentially stable appearance of stranding/infiltrative process in the left omentum, and soft tissue density along the anterior renal fascia/anterior para renal space.  Some of this may be postoperative fat necrosis and scarring, although monitoring is likely warranted. 3.  Bilateral hypodense renal lesions.  Some of the left renal lesions are complex.  If the patient has hematuria or if further workup is warranted in light of the patient's overall clinical situation, renal protocol MRI of the abdomen with and without contrast would be the best way work these out to exclude small foci of renal cell carcinoma. 4.  Focal wall thickening in the proximal sigmoid colon could be related to the diverticulosis, but I cannot exclude a sigmoid colon malignancy.  Consider colonoscopy if feasible and if not recently performed. 5.  Bilateral nonobstructive nephrolithiasis. 6.  Gastric distention.   Original Report Authenticated By: Gaylyn Rong, M.D.     ASSESSMENT AND PLAN: This is a very pleasant 78 years old white male with metastatic pancreatic adenocarcinoma currently on systemic chemotherapy with FOLFIRI status post 7 cycles with his chemotherapy doses being reduced with cycle #7 due to increasing fatigue and weakness.. The patient is doing fine except for the chemotherapy-induced fatigue and weakness which is taking  him more time to recover. He also has some occasional low back pain after standing or sitting for long periods of time. It's unclear what the etiology of this back pain as. We'll continue to monitor his complaints of back pain. Unfortunately his for neutrophil count is subtherapeutic proceed with chemotherapy today as scheduled at 0.7. Patient was advised to monitor his temperature. We'll reschedule lab and chemotherapy to next week. He will followup in 3 weeks with another symptom management visit and prior to his next scheduled cycle of chemotherapy. Neutropenic precautions were reviewed the patient's wife and both voiced understanding. Patient was discussed with him also seen by Dr. Arbutus Ped. He is given a prescription for his OxyContin 10 mg tablets a total of 60 tablets a refill prescription for his Zofran 8 mg tablets was sent to his pharmacy of record via E. scribed. He artery had refills available for his Remeron.  Laural Benes, Kemonie Cutillo E, PA-C    The patient was advised to call immediately if he has any concerning symptoms in the interval. The patient voices understanding of current disease status and treatment options and is in agreement with the current care plan.  All questions were answered. The patient knows to call the clinic with any problems, questions or concerns. We can certainly see the patient much sooner if necessary.  ADDENDUM: Hematology/Oncology Attending: I have face to face encounter with the patient today. I recommended his care plan. The patient tolerated the last cycle of his systemic chemotherapy with FOLFIRI fairly well except for mild fatigue. His absolute neutrophil count is low today. I recommended for him to delay the start of the next cycle of chemotherapy until next week. He would come back for follow up visit in 3 weeks with the following cycle of chemotherapy. He was advised to call immediately if she has any concerning symptoms in the interval. Lajuana Matte.,  MD 04/04/2013

## 2013-04-04 NOTE — Patient Instructions (Addendum)
Your neutrophil count is too low to proceed with chemotherapy as scheduled today. Your chemotherapy will be rescheduled to start again next week as long as you're counts are within normal ranges Followup in 3 weeks for a symptom management visit prior to your next scheduled cycle of chemotherapy Follow neutropenic precautions and notify our office immediately if he develops any fever.

## 2013-04-04 NOTE — Telephone Encounter (Signed)
Per staff message and POF I have scheduled appts.  [name]  

## 2013-04-04 NOTE — Telephone Encounter (Signed)
gv and printed appt sched and avs for pt...emailed MW to add tx... °

## 2013-04-11 ENCOUNTER — Other Ambulatory Visit: Payer: Self-pay | Admitting: *Deleted

## 2013-04-11 ENCOUNTER — Ambulatory Visit (HOSPITAL_BASED_OUTPATIENT_CLINIC_OR_DEPARTMENT_OTHER): Payer: Medicare Other

## 2013-04-11 ENCOUNTER — Other Ambulatory Visit (HOSPITAL_BASED_OUTPATIENT_CLINIC_OR_DEPARTMENT_OTHER): Payer: Medicare Other | Admitting: Lab

## 2013-04-11 VITALS — BP 119/66 | HR 107 | Temp 98.0°F

## 2013-04-11 DIAGNOSIS — C259 Malignant neoplasm of pancreas, unspecified: Secondary | ICD-10-CM

## 2013-04-11 DIAGNOSIS — C787 Secondary malignant neoplasm of liver and intrahepatic bile duct: Secondary | ICD-10-CM

## 2013-04-11 DIAGNOSIS — Z5111 Encounter for antineoplastic chemotherapy: Secondary | ICD-10-CM

## 2013-04-11 DIAGNOSIS — C252 Malignant neoplasm of tail of pancreas: Secondary | ICD-10-CM

## 2013-04-11 LAB — COMPREHENSIVE METABOLIC PANEL (CC13)
Alkaline Phosphatase: 249 U/L — ABNORMAL HIGH (ref 40–150)
CO2: 26 mEq/L (ref 22–29)
Creatinine: 0.8 mg/dL (ref 0.7–1.3)
Glucose: 155 mg/dl — ABNORMAL HIGH (ref 70–140)
Total Bilirubin: 0.58 mg/dL (ref 0.20–1.20)

## 2013-04-11 LAB — CBC WITH DIFFERENTIAL/PLATELET
Basophils Absolute: 0 10*3/uL (ref 0.0–0.1)
EOS%: 3.2 % (ref 0.0–7.0)
Eosinophils Absolute: 0.2 10*3/uL (ref 0.0–0.5)
LYMPH%: 13 % — ABNORMAL LOW (ref 14.0–49.0)
MCH: 29.6 pg (ref 27.2–33.4)
MCV: 92.4 fL (ref 79.3–98.0)
MONO%: 32.1 % — ABNORMAL HIGH (ref 0.0–14.0)
NEUT#: 3.9 10*3/uL (ref 1.5–6.5)
Platelets: 556 10*3/uL — ABNORMAL HIGH (ref 140–400)
RBC: 3.14 10*6/uL — ABNORMAL LOW (ref 4.20–5.82)
RDW: 20.3 % — ABNORMAL HIGH (ref 11.0–14.6)

## 2013-04-11 MED ORDER — SODIUM CHLORIDE 0.9 % IV SOLN
Freq: Once | INTRAVENOUS | Status: AC
  Administered 2013-04-11: 10:00:00 via INTRAVENOUS

## 2013-04-11 MED ORDER — ONDANSETRON 16 MG/50ML IVPB (CHCC)
16.0000 mg | Freq: Once | INTRAVENOUS | Status: AC
  Administered 2013-04-11: 16 mg via INTRAVENOUS

## 2013-04-11 MED ORDER — IRINOTECAN HCL CHEMO INJECTION 100 MG/5ML
160.0000 mg/m2 | Freq: Once | INTRAVENOUS | Status: AC
  Administered 2013-04-11: 298 mg via INTRAVENOUS
  Filled 2013-04-11: qty 14.9

## 2013-04-11 MED ORDER — ATROPINE SULFATE 1 MG/ML IJ SOLN
0.5000 mg | Freq: Once | INTRAMUSCULAR | Status: AC | PRN
  Administered 2013-04-11: 0.5 mg via INTRAVENOUS

## 2013-04-11 MED ORDER — DEXAMETHASONE SODIUM PHOSPHATE 20 MG/5ML IJ SOLN
20.0000 mg | Freq: Once | INTRAMUSCULAR | Status: AC
  Administered 2013-04-11: 20 mg via INTRAVENOUS

## 2013-04-11 MED ORDER — FLUOROURACIL CHEMO INJECTION 2.5 GM/50ML
400.0000 mg/m2 | Freq: Once | INTRAVENOUS | Status: AC
  Administered 2013-04-11: 750 mg via INTRAVENOUS
  Filled 2013-04-11: qty 15

## 2013-04-11 MED ORDER — SODIUM CHLORIDE 0.9 % IV SOLN
2000.0000 mg/m2 | INTRAVENOUS | Status: DC
  Administered 2013-04-11: 3700 mg via INTRAVENOUS
  Filled 2013-04-11: qty 74

## 2013-04-11 MED ORDER — LEUCOVORIN CALCIUM INJECTION 350 MG
400.0000 mg/m2 | Freq: Once | INTRAVENOUS | Status: AC
  Administered 2013-04-11: 740 mg via INTRAVENOUS
  Filled 2013-04-11: qty 37

## 2013-04-11 NOTE — Patient Instructions (Addendum)
[facility] Discharge Instructions for Patients Receiving Chemotherapy  Today you received the following chemotherapy agents Irinotecan/Leucovorin/5FU.  To help prevent nausea and vomiting after your treatment, we encourage you to take your nausea medication as prescribed.  If you develop nausea and vomiting that is not controlled by your nausea medication, call the clinic.   BELOW ARE SYMPTOMS THAT SHOULD BE REPORTED IMMEDIATELY:  *FEVER GREATER THAN 100.5 F  *CHILLS WITH OR WITHOUT FEVER  NAUSEA AND VOMITING THAT IS NOT CONTROLLED WITH YOUR NAUSEA MEDICATION  *UNUSUAL SHORTNESS OF BREATH  *UNUSUAL BRUISING OR BLEEDING  TENDERNESS IN MOUTH AND THROAT WITH OR WITHOUT PRESENCE OF ULCERS  *URINARY PROBLEMS  *BOWEL PROBLEMS  UNUSUAL RASH Items with * indicate a potential emergency and should be followed up as soon as possible.  Feel free to call the clinic you have any questions or concerns. The clinic phone number is [phone].    

## 2013-04-13 ENCOUNTER — Ambulatory Visit (HOSPITAL_BASED_OUTPATIENT_CLINIC_OR_DEPARTMENT_OTHER): Payer: Medicare Other

## 2013-04-13 VITALS — BP 125/76 | HR 89 | Temp 96.8°F

## 2013-04-13 DIAGNOSIS — C259 Malignant neoplasm of pancreas, unspecified: Secondary | ICD-10-CM

## 2013-04-13 MED ORDER — HEPARIN SOD (PORK) LOCK FLUSH 100 UNIT/ML IV SOLN
500.0000 [IU] | Freq: Once | INTRAVENOUS | Status: AC | PRN
  Administered 2013-04-13: 500 [IU]
  Filled 2013-04-13: qty 5

## 2013-04-13 MED ORDER — SODIUM CHLORIDE 0.9 % IJ SOLN
10.0000 mL | INTRAMUSCULAR | Status: DC | PRN
  Administered 2013-04-13: 10 mL
  Filled 2013-04-13: qty 10

## 2013-04-18 ENCOUNTER — Other Ambulatory Visit: Payer: Medicare Other | Admitting: Lab

## 2013-04-18 ENCOUNTER — Inpatient Hospital Stay: Payer: Medicare Other

## 2013-04-26 ENCOUNTER — Encounter: Payer: Self-pay | Admitting: Internal Medicine

## 2013-04-26 ENCOUNTER — Telehealth: Payer: Self-pay | Admitting: *Deleted

## 2013-04-26 ENCOUNTER — Telehealth: Payer: Self-pay | Admitting: Internal Medicine

## 2013-04-26 ENCOUNTER — Ambulatory Visit (HOSPITAL_BASED_OUTPATIENT_CLINIC_OR_DEPARTMENT_OTHER): Payer: Medicare Other

## 2013-04-26 ENCOUNTER — Ambulatory Visit (HOSPITAL_BASED_OUTPATIENT_CLINIC_OR_DEPARTMENT_OTHER): Payer: Medicare Other | Admitting: Internal Medicine

## 2013-04-26 ENCOUNTER — Other Ambulatory Visit (HOSPITAL_BASED_OUTPATIENT_CLINIC_OR_DEPARTMENT_OTHER): Payer: Medicare Other | Admitting: Lab

## 2013-04-26 VITALS — BP 129/62 | HR 122 | Temp 98.6°F | Resp 19 | Ht 67.0 in | Wt 153.4 lb

## 2013-04-26 DIAGNOSIS — C189 Malignant neoplasm of colon, unspecified: Secondary | ICD-10-CM

## 2013-04-26 DIAGNOSIS — C787 Secondary malignant neoplasm of liver and intrahepatic bile duct: Secondary | ICD-10-CM

## 2013-04-26 DIAGNOSIS — Z5111 Encounter for antineoplastic chemotherapy: Secondary | ICD-10-CM

## 2013-04-26 DIAGNOSIS — C259 Malignant neoplasm of pancreas, unspecified: Secondary | ICD-10-CM

## 2013-04-26 DIAGNOSIS — R0609 Other forms of dyspnea: Secondary | ICD-10-CM

## 2013-04-26 DIAGNOSIS — C252 Malignant neoplasm of tail of pancreas: Secondary | ICD-10-CM

## 2013-04-26 DIAGNOSIS — J209 Acute bronchitis, unspecified: Secondary | ICD-10-CM

## 2013-04-26 DIAGNOSIS — M549 Dorsalgia, unspecified: Secondary | ICD-10-CM

## 2013-04-26 LAB — COMPREHENSIVE METABOLIC PANEL (CC13)
AST: 19 U/L (ref 5–34)
BUN: 18.4 mg/dL (ref 7.0–26.0)
Calcium: 8.3 mg/dL — ABNORMAL LOW (ref 8.4–10.4)
Chloride: 97 mEq/L — ABNORMAL LOW (ref 98–109)
Creatinine: 0.8 mg/dL (ref 0.7–1.3)

## 2013-04-26 LAB — CBC WITH DIFFERENTIAL/PLATELET
BASO%: 0.5 % (ref 0.0–2.0)
HCT: 25.8 % — ABNORMAL LOW (ref 38.4–49.9)
LYMPH%: 18.5 % (ref 14.0–49.0)
MCH: 29.1 pg (ref 27.2–33.4)
MCHC: 32.6 g/dL (ref 32.0–36.0)
MONO#: 1.5 10*3/uL — ABNORMAL HIGH (ref 0.1–0.9)
NEUT%: 38.9 % — ABNORMAL LOW (ref 39.0–75.0)
Platelets: 598 10*3/uL — ABNORMAL HIGH (ref 140–400)

## 2013-04-26 MED ORDER — DEXAMETHASONE SODIUM PHOSPHATE 20 MG/5ML IJ SOLN
20.0000 mg | Freq: Once | INTRAMUSCULAR | Status: AC
  Administered 2013-04-26: 20 mg via INTRAVENOUS

## 2013-04-26 MED ORDER — AZITHROMYCIN 250 MG PO TABS: ORAL_TABLET | ORAL | Status: DC

## 2013-04-26 MED ORDER — LEUCOVORIN CALCIUM INJECTION 350 MG
400.0000 mg/m2 | Freq: Once | INTRAVENOUS | Status: AC
  Administered 2013-04-26: 740 mg via INTRAVENOUS
  Filled 2013-04-26: qty 37

## 2013-04-26 MED ORDER — LIDOCAINE-PRILOCAINE 2.5-2.5 % EX CREA: 1.0000 "application " | TOPICAL_CREAM | CUTANEOUS | Status: AC | PRN

## 2013-04-26 MED ORDER — SODIUM CHLORIDE 0.9 % IV SOLN
2000.0000 mg/m2 | INTRAVENOUS | Status: DC
  Administered 2013-04-26: 3700 mg via INTRAVENOUS
  Filled 2013-04-26: qty 74

## 2013-04-26 MED ORDER — SODIUM CHLORIDE 0.9 % IV SOLN
Freq: Once | INTRAVENOUS | Status: AC
  Administered 2013-04-26: 11:00:00 via INTRAVENOUS

## 2013-04-26 MED ORDER — ONDANSETRON HCL 8 MG PO TABS: 8.0000 mg | ORAL_TABLET | Freq: Two times a day (BID) | ORAL | Status: AC | PRN

## 2013-04-26 MED ORDER — ATROPINE SULFATE 1 MG/ML IJ SOLN
0.5000 mg | Freq: Once | INTRAMUSCULAR | Status: AC | PRN
  Administered 2013-04-26: 0.5 mg via INTRAVENOUS

## 2013-04-26 MED ORDER — MIRTAZAPINE 30 MG PO TABS: ORAL_TABLET | ORAL | Status: AC

## 2013-04-26 MED ORDER — ONDANSETRON 16 MG/50ML IVPB (CHCC)
16.0000 mg | Freq: Once | INTRAVENOUS | Status: AC
  Administered 2013-04-26: 16 mg via INTRAVENOUS

## 2013-04-26 MED ORDER — FLUOROURACIL CHEMO INJECTION 2.5 GM/50ML
400.0000 mg/m2 | Freq: Once | INTRAVENOUS | Status: AC
  Administered 2013-04-26: 750 mg via INTRAVENOUS
  Filled 2013-04-26: qty 15

## 2013-04-26 MED ORDER — IRINOTECAN HCL CHEMO INJECTION 100 MG/5ML
160.0000 mg/m2 | Freq: Once | INTRAVENOUS | Status: AC
  Administered 2013-04-26: 298 mg via INTRAVENOUS
  Filled 2013-04-26: qty 14.9

## 2013-04-26 NOTE — Telephone Encounter (Signed)
gave pt appt for lab , ML and DC pump, eamiled michelle regarding chemo

## 2013-04-26 NOTE — Telephone Encounter (Signed)
Per staff message and POF I have scheduled appts.  [name]  

## 2013-04-26 NOTE — Telephone Encounter (Signed)
Talked to pt's wife she is aware of all appts on September 2014

## 2013-04-26 NOTE — Patient Instructions (Signed)
[facility] Discharge Instructions for Patients Receiving Chemotherapy  Today you received the following chemotherapy agents 64fu, leucovorin, irinotecan  To help prevent nausea and vomiting after your treatment, we encourage you to take your nausea medication as needed   If you develop nausea and vomiting that is not controlled by your nausea medication, call the clinic.   BELOW ARE SYMPTOMS THAT SHOULD BE REPORTED IMMEDIATELY:  *FEVER GREATER THAN 100.5 F  *CHILLS WITH OR WITHOUT FEVER  NAUSEA AND VOMITING THAT IS NOT CONTROLLED WITH YOUR NAUSEA MEDICATION  *UNUSUAL SHORTNESS OF BREATH  *UNUSUAL BRUISING OR BLEEDING  TENDERNESS IN MOUTH AND THROAT WITH OR WITHOUT PRESENCE OF ULCERS  *URINARY PROBLEMS  *BOWEL PROBLEMS  UNUSUAL RASH Items with * indicate a potential emergency and should be followed up as soon as possible.  Feel free to call the clinic you have any questions or concerns. The clinic phone number is [phone].

## 2013-04-26 NOTE — Progress Notes (Signed)
[facility] Telephone:([phone]   [phone]  OFFICE PROGRESS NOTE  [name], MD Dr. Sharman Crate Hamrick [address] [place] [address]  DIAGNOSIS: Metastatic pancreatic adenocarcinoma, now with metastatic disease to the liver and the abdominal wall, initially diagnosed with stage IIIB(T3, N1, M0) pancreatic tail adenocarcinoma in November of 2011.   PRIOR THERAPY:  #1 distal pancreatectomy and splenectomy on 06/25/2010  #2 adjuvant chemotherapy with gemcitabine started 08/20/2010 and completed 2 cycles on 12/02/2010.  #3 status post concurrent chemoradiation with Xeloda from 12/15/2010 on 10 01/24/2011.  #4 status post 2 more cycles of adjuvant gemcitabine with gemcitabine completed on 04/04/2011.  #5 FOLFOX giving every 2 weeks status post 12 cycles.  #6 Continuous infusion 5-FU and leucovorin every 2 weeks. Status post 4 cycles discontinued today secondary to disease progression.  #7 The patient was started on 05/03/2012 the first cycle of systemic chemotherapy with gemcitabine 800 mg/M2 in addition to Abraxane 80 mg/M2 on weekly basis for 3 out of every 4 weeks. Status post 6 cycles, discontinued today secondary to disease progression.   CURRENT THERAPY: FOLFIRI (5FU, Leucovorin and Irinotecan) every 2 weeks. First cycle was given on 12/06/2012 . S/P 8 cycles.   CHEMOTHERAPY INTENT: Palliative  CURRENT # OF CHEMOTHERAPY CYCLES: 8  CURRENT ANTIEMETICS: Zofran, Decadron and Compazine  CURRENT SMOKING STATUS: Nonsmoker  ORAL CHEMOTHERAPY AND CONSENT: None  CURRENT BISPHOSPHONATES USE: None  PAIN MANAGEMENT: Well-tolerated with OxyContin and oxycodone  NARCOTICS INDUCED CONSTIPATION: Over-the-counter stool softner  LIVING WILL AND CODE STATUS: Full code   INTERVAL HISTORY: Philip Shaw 78 y.o. male returns to the clinic today for followup visit accompanied his wife. The patient is feeling fine today with no specific complaints  except for shortness of breath with exertion over the last 2 weeks with cough productive of whitish sputum. The patient also continues to have the low back pain but no significant change from before. He denied having any significant fever or chills. He denied having any nausea or vomiting. He is tolerating his systemic chemotherapy with FOLFIRI well. He denied having any significant weight loss or night sweats.  MEDICAL HISTORY: Past Medical History  Diagnosis Date  . Heart murmur   . Abdominal pain, periumbilical   . Constipation   . Diabetes mellitus   . Hypertension   . Sleep apnea     pt states he no longer uses his cpap - lost weight  . pancreatic ca dx'd 04/2010    METASTATIC PANCREATIC CA WITH METS TO LIVER AND ABDOMINAL WALL  -chemo every 2 weeks - ONCOLOGIST DR. Iara Monds WITH [facility].  Marland Kitchen Port-a-cath in place     RIGHT UPPER CHEST - FOR CHEMO EVERY 2 WEEKS    ALLERGIES:  has No Known Allergies.  MEDICATIONS:  Current Outpatient Prescriptions  Medication Sig Dispense Refill  . aspirin 81 MG tablet Take 81 mg by mouth every morning.       . gabapentin (NEURONTIN) 100 MG capsule Take 100 mg by mouth 2 (two) times daily.       . insulin glargine (LANTUS) 100 UNIT/ML injection Inject 24 Units into the skin at bedtime.      . lidocaine (XYLOCAINE) 2 % solution TAKE BY MOUTH FOUR TIMES DAILY  240 mL  1  . lidocaine-prilocaine (EMLA) cream Apply 1 application topically as needed.  30 g  2  . mirtazapine (REMERON) 30 MG tablet TAKE 1 TABLET BY  MOUTH EVERY NIGHT AT BEDTIME  30 tablet  2  . ondansetron (ZOFRAN) 8 MG tablet Take 1 tablet (8 mg total) by mouth every 12 (twelve) hours as needed for nausea.  20 tablet  3  . oxyCODONE (OXY IR/ROXICODONE) 5 MG immediate release tablet Take 1 tablet (5 mg total) by mouth every 4 (four) hours as needed.  30 tablet  0  . oxyCODONE (OXYCONTIN) 10 MG 12 hr tablet Take 1 tablet (10 mg total) by mouth every 12 (twelve)  hours. 02/14/13-pt states he only takes one /day  60 tablet  0  . ranitidine (ZANTAC) 150 MG tablet Take 150 mg by mouth 2 (two) times daily.       . tamsulosin (FLOMAX) 0.4 MG CAPS Take 0.4 mg by mouth 2 (two) times daily.      . Alum & Mag Hydroxide-Simeth (MAGIC MOUTHWASH) SOLN Take 5 mLs by mouth QID.  240 mL  0  . [DISCONTINUED] metFORMIN (GLUCOPHAGE) 850 MG tablet Take 850 mg by mouth 2 (two) times daily with a meal.        No current facility-administered medications for this visit.   Facility-Administered Medications Ordered in Other Visits  Medication Dose Route Frequency Provider Last Rate Last Dose  . sodium chloride 0.9 % injection 10 mL  10 mL Intracatheter PRN Si Gaul, MD   10 mL at 07/26/12 1715  . sodium chloride 0.9 % injection 10 mL  10 mL Intracatheter PRN Conni Slipper, PA-C   10 mL at 09/20/12 1736    SURGICAL HISTORY:  Past Surgical History  Procedure Laterality Date  . Appendectomy  41-62 years old  . Pancreatectomy  06/2010    with splenectomy  . Inguinal hernia repair  as infant, age 86    LIH- infant, RIH age 66  . Knee arthroscopy  04/23/2012    Procedure: ARTHROSCOPY KNEE;  Surgeon: Loanne Drilling, MD;  Location: [facility];  Service: Orthopedics;  Laterality: Right;  with Debridement  . Splenectomy, total      REVIEW OF SYSTEMS:  A comprehensive review of systems was negative except for: Constitutional: positive for fatigue Respiratory: positive for cough, dyspnea on exertion and sputum   PHYSICAL EXAMINATION: General appearance: alert, cooperative, fatigued and no distress Head: Normocephalic, without obvious abnormality, atraumatic Neck: no adenopathy Lymph nodes: Cervical, supraclavicular, and axillary nodes normal. Resp: clear to auscultation bilaterally Cardio: regular rate and rhythm, S1, S2 normal, no murmur, click, rub or gallop GI: soft, non-tender; bowel sounds normal; no masses,  no organomegaly Extremities: extremities normal,  atraumatic, no cyanosis or edema Neurologic: Alert and oriented X 3, normal strength and tone. Normal symmetric reflexes. Normal coordination and gait  ECOG PERFORMANCE STATUS: 1 - Symptomatic but completely ambulatory  Blood pressure 129/62, pulse 122, temperature 98.6 F (37 C), temperature source Oral, resp. rate 19, height 5\' 7"  (1.702 m), weight 153 lb 6.4 oz (69.582 kg), SpO2 95.00%.  LABORATORY DATA: Lab Results  Component Value Date   WBC 7.6 04/11/2013   HGB 9.3* 04/11/2013   HCT 29.0* 04/11/2013   MCV 92.4 04/11/2013   PLT 556* 04/11/2013      Chemistry      Component Value Date/Time   NA 135* 04/11/2013 0904   NA 131* 01/09/2013 0500   K 4.6 04/11/2013 0904   K 3.9 01/09/2013 0500   CL 103 02/14/2013 0906   CL 99 01/09/2013 0500   CO2 26 04/11/2013 0904   CO2 28 01/09/2013 0500  BUN 18.1 04/11/2013 0904   BUN 18 01/09/2013 0500   CREATININE 0.8 04/11/2013 0904   CREATININE 0.73 01/09/2013 0500      Component Value Date/Time   CALCIUM 8.8 04/11/2013 0904   CALCIUM 8.8 01/09/2013 0500   ALKPHOS 249* 04/11/2013 0904   ALKPHOS 211* 01/07/2013 2135   AST 20 04/11/2013 0904   AST 45* 01/07/2013 2135   ALT 22 04/11/2013 0904   ALT 38 01/07/2013 2135   BILITOT 0.58 04/11/2013 0904   BILITOT 0.4 01/07/2013 2135       RADIOGRAPHIC STUDIES: No results found.  ASSESSMENT AND PLAN: This is a very pleasant 78 years old white male with metastatic pancreatic adenocarcinoma currently on treatment with FOLFIRI status post 8 cycles. He is tolerating his treatment fairly well with no significant adverse effects. We will proceed with cycle #9 today as scheduled. For the acute bronchitis, I will start the patient on Z-Pak. He was given a refill for his medication with Remeron 30 mg by mouth each bedtime, Zofran 8 mg by mouth every 12 hours as needed for nausea in addition to Emla Cream. He would come back for followup visit in 2 weeks for evaluation and management any adverse effect of his  treatment.  The patient voices understanding of current disease status and treatment options and is in agreement with the current care plan.  All questions were answered. The patient knows to call the clinic with any problems, questions or concerns. We can certainly see the patient much sooner if necessary.  I spent 15 minutes counseling the patient face to face. The total time spent in the appointment was 25 minutes.

## 2013-04-28 ENCOUNTER — Ambulatory Visit (HOSPITAL_BASED_OUTPATIENT_CLINIC_OR_DEPARTMENT_OTHER): Payer: Medicare Other

## 2013-04-28 VITALS — BP 113/75 | HR 118 | Temp 98.0°F | Resp 18

## 2013-04-28 DIAGNOSIS — C252 Malignant neoplasm of tail of pancreas: Secondary | ICD-10-CM

## 2013-04-28 DIAGNOSIS — C259 Malignant neoplasm of pancreas, unspecified: Secondary | ICD-10-CM

## 2013-04-28 DIAGNOSIS — C787 Secondary malignant neoplasm of liver and intrahepatic bile duct: Secondary | ICD-10-CM

## 2013-04-28 DIAGNOSIS — Z452 Encounter for adjustment and management of vascular access device: Secondary | ICD-10-CM

## 2013-04-28 MED ORDER — SODIUM CHLORIDE 0.9 % IJ SOLN
10.0000 mL | INTRAMUSCULAR | Status: DC | PRN
  Administered 2013-04-28: 10 mL
  Filled 2013-04-28: qty 10

## 2013-04-28 MED ORDER — HEPARIN SOD (PORK) LOCK FLUSH 100 UNIT/ML IV SOLN
500.0000 [IU] | Freq: Once | INTRAVENOUS | Status: AC | PRN
  Administered 2013-04-28: 500 [IU]
  Filled 2013-04-28: qty 5

## 2013-05-02 ENCOUNTER — Telehealth: Payer: Self-pay | Admitting: Dietician

## 2013-05-02 NOTE — Telephone Encounter (Signed)
Brief Outpatient Oncology Nutrition Note  Patient has been identified to be at risk on malnutrition screen.  Wt Readings from Last 10 Encounters:  04/26/13 153 lb 6.4 oz (69.582 kg)  04/04/13 152 lb 3.2 oz (69.037 kg)  03/21/13 152 lb 14.4 oz (69.355 kg)  03/07/13 154 lb 4.8 oz (69.99 kg)  02/14/13 158 lb 12.8 oz (72.031 kg)  01/24/13 157 lb 14.4 oz (71.623 kg)  01/10/13 158 lb 12.8 oz (72.031 kg)  01/07/13 158 lb 8.2 oz (71.9 kg)  12/20/12 159 lb 4.8 oz (72.258 kg)  12/01/12 161 lb 3.2 oz (73.12 kg)    Patient with pancreatic cancer.  Last seen [facility] 07/05/12.  Patient weighted 159 lbs at that time.  Patient with about a 6 lb weight loss in the past 3 months.  Per chart, patient is drinking 6-8 shakes daily made with milk, cream, fruit, and protein powder.  Called patient who was unavailable.  Contact information provided for [facility].  Oran Rein, RD, LDN

## 2013-05-03 ENCOUNTER — Encounter (HOSPITAL_COMMUNITY): Payer: Self-pay | Admitting: Emergency Medicine

## 2013-05-03 ENCOUNTER — Inpatient Hospital Stay (HOSPITAL_COMMUNITY): Payer: Medicare Other

## 2013-05-03 ENCOUNTER — Inpatient Hospital Stay (HOSPITAL_COMMUNITY)
Admission: EM | Admit: 2013-05-03 | Discharge: 2013-05-11 | DRG: 388 | Disposition: A | Discharge status: Home / Self Care | Payer: Medicare Other | Attending: Internal Medicine | Admitting: Internal Medicine

## 2013-05-03 ENCOUNTER — Emergency Department (HOSPITAL_COMMUNITY): Payer: Medicare Other

## 2013-05-03 ENCOUNTER — Telehealth: Payer: Self-pay | Admitting: Medical Oncology

## 2013-05-03 DIAGNOSIS — K56609 Unspecified intestinal obstruction, unspecified as to partial versus complete obstruction: Secondary | ICD-10-CM

## 2013-05-03 DIAGNOSIS — R1905 Periumbilic swelling, mass or lump: Secondary | ICD-10-CM

## 2013-05-03 DIAGNOSIS — IMO0002 Reserved for concepts with insufficient information to code with codable children: Secondary | ICD-10-CM

## 2013-05-03 DIAGNOSIS — E440 Moderate protein-calorie malnutrition: Secondary | ICD-10-CM | POA: Insufficient documentation

## 2013-05-03 DIAGNOSIS — C786 Secondary malignant neoplasm of retroperitoneum and peritoneum: Secondary | ICD-10-CM | POA: Diagnosis present

## 2013-05-03 DIAGNOSIS — T451X5A Adverse effect of antineoplastic and immunosuppressive drugs, initial encounter: Secondary | ICD-10-CM | POA: Diagnosis present

## 2013-05-03 DIAGNOSIS — Z800 Family history of malignant neoplasm of digestive organs: Secondary | ICD-10-CM

## 2013-05-03 DIAGNOSIS — C787 Secondary malignant neoplasm of liver and intrahepatic bile duct: Secondary | ICD-10-CM | POA: Diagnosis present

## 2013-05-03 DIAGNOSIS — I10 Essential (primary) hypertension: Secondary | ICD-10-CM | POA: Diagnosis present

## 2013-05-03 DIAGNOSIS — C7800 Secondary malignant neoplasm of unspecified lung: Secondary | ICD-10-CM | POA: Diagnosis present

## 2013-05-03 DIAGNOSIS — R64 Cachexia: Secondary | ICD-10-CM | POA: Diagnosis present

## 2013-05-03 DIAGNOSIS — R112 Nausea with vomiting, unspecified: Secondary | ICD-10-CM

## 2013-05-03 DIAGNOSIS — C259 Malignant neoplasm of pancreas, unspecified: Secondary | ICD-10-CM | POA: Diagnosis present

## 2013-05-03 DIAGNOSIS — Z7982 Long term (current) use of aspirin: Secondary | ICD-10-CM

## 2013-05-03 DIAGNOSIS — K560 Paralytic ileus: Secondary | ICD-10-CM | POA: Diagnosis present

## 2013-05-03 DIAGNOSIS — D638 Anemia in other chronic diseases classified elsewhere: Secondary | ICD-10-CM

## 2013-05-03 DIAGNOSIS — J189 Pneumonia, unspecified organism: Secondary | ICD-10-CM | POA: Diagnosis present

## 2013-05-03 DIAGNOSIS — E876 Hypokalemia: Secondary | ICD-10-CM

## 2013-05-03 DIAGNOSIS — D6481 Anemia due to antineoplastic chemotherapy: Secondary | ICD-10-CM | POA: Diagnosis present

## 2013-05-03 DIAGNOSIS — R011 Cardiac murmur, unspecified: Secondary | ICD-10-CM | POA: Diagnosis present

## 2013-05-03 DIAGNOSIS — E871 Hypo-osmolality and hyponatremia: Secondary | ICD-10-CM | POA: Diagnosis present

## 2013-05-03 DIAGNOSIS — N200 Calculus of kidney: Secondary | ICD-10-CM | POA: Diagnosis present

## 2013-05-03 DIAGNOSIS — E119 Type 2 diabetes mellitus without complications: Secondary | ICD-10-CM | POA: Diagnosis present

## 2013-05-03 DIAGNOSIS — G4730 Sleep apnea, unspecified: Secondary | ICD-10-CM | POA: Diagnosis present

## 2013-05-03 DIAGNOSIS — J90 Pleural effusion, not elsewhere classified: Secondary | ICD-10-CM

## 2013-05-03 DIAGNOSIS — J910 Malignant pleural effusion: Secondary | ICD-10-CM | POA: Diagnosis present

## 2013-05-03 DIAGNOSIS — Z794 Long term (current) use of insulin: Secondary | ICD-10-CM

## 2013-05-03 DIAGNOSIS — Z87891 Personal history of nicotine dependence: Secondary | ICD-10-CM

## 2013-05-03 DIAGNOSIS — Z79899 Other long term (current) drug therapy: Secondary | ICD-10-CM

## 2013-05-03 DIAGNOSIS — R0902 Hypoxemia: Secondary | ICD-10-CM | POA: Diagnosis present

## 2013-05-03 LAB — URINALYSIS, ROUTINE W REFLEX MICROSCOPIC
Ketones, ur: 40 mg/dL — AB
Nitrite: NEGATIVE
Urobilinogen, UA: 0.2 mg/dL (ref 0.0–1.0)

## 2013-05-03 LAB — BASIC METABOLIC PANEL
Calcium: 9.5 mg/dL (ref 8.4–10.5)
GFR calc Af Amer: 90 mL/min — ABNORMAL LOW (ref >90)
GFR calc non Af Amer: 78 mL/min — ABNORMAL LOW (ref >90)
Sodium: 131 mEq/L — ABNORMAL LOW (ref 135–145)

## 2013-05-03 LAB — CBC WITH DIFFERENTIAL/PLATELET
Basophils Absolute: 0 10*3/uL (ref 0.0–0.1)
Basophils Relative: 0 % (ref 0–1)
Eosinophils Absolute: 0 10*3/uL (ref 0.0–0.7)
Eosinophils Relative: 0 % (ref 0–5)
HCT: 28 % — ABNORMAL LOW (ref 39.0–52.0)
Hemoglobin: 9.3 g/dL — ABNORMAL LOW (ref 13.0–17.0)
Lymphocytes Relative: 25 % (ref 12–46)
Lymphs Abs: 0.4 10*3/uL — ABNORMAL LOW (ref 0.7–4.0)
MCH: 29.2 pg (ref 26.0–34.0)
MCHC: 33.2 g/dL (ref 30.0–36.0)
MCV: 87.8 fL (ref 78.0–100.0)
Monocytes Absolute: 0.2 10*3/uL (ref 0.1–1.0)
Monocytes Relative: 13 % — ABNORMAL HIGH (ref 3–12)
Neutro Abs: 0.9 10*3/uL — ABNORMAL LOW (ref 1.7–7.7)
Neutrophils Relative %: 62 % (ref 43–77)
Platelets: 310 10*3/uL (ref 150–400)
RBC: 3.19 MIL/uL — ABNORMAL LOW (ref 4.22–5.81)
RDW: 18.9 % — ABNORMAL HIGH (ref 11.5–15.5)
WBC: 1.5 10*3/uL — ABNORMAL LOW (ref 4.0–10.5)

## 2013-05-03 LAB — GLUCOSE, PERITONEAL FLUID: Glucose, Peritoneal Fluid: 99 mg/dL

## 2013-05-03 LAB — PROTIME-INR: INR: 1.39 (ref 0.00–1.49)

## 2013-05-03 LAB — APTT: aPTT: 29 seconds (ref 24–37)

## 2013-05-03 LAB — GRAM STAIN

## 2013-05-03 LAB — CBC
MCH: 29.2 pg (ref 26.0–34.0)
Platelets: 307 10*3/uL (ref 150–400)
RBC: 3.15 MIL/uL — ABNORMAL LOW (ref 4.22–5.81)
WBC: 1.5 10*3/uL — ABNORMAL LOW (ref 4.0–10.5)

## 2013-05-03 LAB — POCT I-STAT TROPONIN I: Troponin i, poc: 0 ng/mL (ref 0.00–0.08)

## 2013-05-03 LAB — LACTATE DEHYDROGENASE, PLEURAL OR PERITONEAL FLUID

## 2013-05-03 MED ORDER — SODIUM CHLORIDE 0.9 % IV SOLN
INTRAVENOUS | Status: DC
  Administered 2013-05-03: 14:00:00 via INTRAVENOUS

## 2013-05-03 MED ORDER — INSULIN GLARGINE 100 UNIT/ML ~~LOC~~ SOLN
24.0000 [IU] | Freq: Every day | SUBCUTANEOUS | Status: DC
  Administered 2013-05-03: 24 [IU] via SUBCUTANEOUS
  Filled 2013-05-03 (×2): qty 0.24

## 2013-05-03 MED ORDER — OXYCODONE HCL 5 MG PO TABS: 5.0000 mg | ORAL_TABLET | ORAL | Status: DC | PRN

## 2013-05-03 MED ORDER — TERAZOSIN HCL 2 MG PO CAPS
2.0000 mg | ORAL_CAPSULE | Freq: Every day | ORAL | Status: DC
  Filled 2013-05-03: qty 1

## 2013-05-03 MED ORDER — VANCOMYCIN HCL IN DEXTROSE 1-5 GM/200ML-% IV SOLN
1000.0000 mg | INTRAVENOUS | Status: AC
  Administered 2013-05-03: 1000 mg via INTRAVENOUS
  Filled 2013-05-03: qty 200

## 2013-05-03 MED ORDER — SODIUM CHLORIDE 0.9 % IJ SOLN
3.0000 mL | Freq: Two times a day (BID) | INTRAMUSCULAR | Status: DC
  Administered 2013-05-03 – 2013-05-09 (×3): 3 mL via INTRAVENOUS

## 2013-05-03 MED ORDER — FAMOTIDINE IN NACL 20-0.9 MG/50ML-% IV SOLN
20.0000 mg | Freq: Two times a day (BID) | INTRAVENOUS | Status: DC
  Administered 2013-05-04 – 2013-05-11 (×15): 20 mg via INTRAVENOUS
  Filled 2013-05-03 (×18): qty 50

## 2013-05-03 MED ORDER — DEXTROSE 5 % IV SOLN
2.0000 g | Freq: Three times a day (TID) | INTRAVENOUS | Status: DC
  Administered 2013-05-03 – 2013-05-04 (×2): 2 g via INTRAVENOUS
  Filled 2013-05-03 (×3): qty 2

## 2013-05-03 MED ORDER — VANCOMYCIN HCL IN DEXTROSE 750-5 MG/150ML-% IV SOLN
750.0000 mg | Freq: Two times a day (BID) | INTRAVENOUS | Status: DC
  Administered 2013-05-04 – 2013-05-05 (×4): 750 mg via INTRAVENOUS
  Filled 2013-05-03 (×5): qty 150

## 2013-05-03 MED ORDER — DEXTROSE 5 % IV SOLN
2.0000 g | INTRAVENOUS | Status: AC
  Administered 2013-05-03: 2 g via INTRAVENOUS
  Filled 2013-05-03: qty 2

## 2013-05-03 MED ORDER — FAMOTIDINE 20 MG PO TABS
20.0000 mg | ORAL_TABLET | Freq: Two times a day (BID) | ORAL | Status: DC
  Filled 2013-05-03: qty 1

## 2013-05-03 MED ORDER — LIDOCAINE-PRILOCAINE 2.5-2.5 % EX CREA
1.0000 "application " | TOPICAL_CREAM | CUTANEOUS | Status: DC | PRN
  Filled 2013-05-03: qty 5

## 2013-05-03 MED ORDER — GABAPENTIN 100 MG PO CAPS
100.0000 mg | ORAL_CAPSULE | Freq: Two times a day (BID) | ORAL | Status: DC
  Administered 2013-05-03 – 2013-05-04 (×2): 100 mg via ORAL
  Filled 2013-05-03 (×3): qty 1

## 2013-05-03 MED ORDER — ONDANSETRON HCL 4 MG/2ML IJ SOLN
4.0000 mg | Freq: Once | INTRAMUSCULAR | Status: AC
  Administered 2013-05-03: 4 mg via INTRAVENOUS
  Filled 2013-05-03: qty 2

## 2013-05-03 MED ORDER — SODIUM CHLORIDE 0.9 % IV SOLN
Freq: Once | INTRAVENOUS | Status: AC
  Administered 2013-05-03: 14:00:00 via INTRAVENOUS

## 2013-05-03 MED ORDER — ONDANSETRON 8 MG/NS 50 ML IVPB
8.0000 mg | Freq: Once | INTRAVENOUS | Status: AC
  Administered 2013-05-03: 8 mg via INTRAVENOUS
  Filled 2013-05-03: qty 8

## 2013-05-03 MED ORDER — TAMSULOSIN HCL 0.4 MG PO CAPS
0.4000 mg | ORAL_CAPSULE | Freq: Two times a day (BID) | ORAL | Status: DC
  Administered 2013-05-03 – 2013-05-04 (×2): 0.4 mg via ORAL
  Filled 2013-05-03 (×3): qty 1

## 2013-05-03 MED ORDER — LIDOCAINE VISCOUS 2 % MT SOLN
15.0000 mL | Freq: Four times a day (QID) | OROMUCOSAL | Status: DC | PRN
  Filled 2013-05-03: qty 15

## 2013-05-03 MED ORDER — ENOXAPARIN SODIUM 40 MG/0.4ML ~~LOC~~ SOLN
40.0000 mg | SUBCUTANEOUS | Status: DC
  Administered 2013-05-03 – 2013-05-10 (×7): 40 mg via SUBCUTANEOUS
  Filled 2013-05-03 (×9): qty 0.4

## 2013-05-03 MED ORDER — HYDROMORPHONE HCL PF 1 MG/ML IJ SOLN
1.0000 mg | INTRAMUSCULAR | Status: DC | PRN
  Administered 2013-05-03 – 2013-05-10 (×29): 1 mg via INTRAVENOUS
  Filled 2013-05-03 (×30): qty 1

## 2013-05-03 MED ORDER — MIRTAZAPINE 7.5 MG PO TABS
7.5000 mg | ORAL_TABLET | Freq: Every day | ORAL | Status: DC
  Administered 2013-05-03: 23:00:00 7.5 mg via ORAL
  Filled 2013-05-03 (×2): qty 1

## 2013-05-03 MED ORDER — OXYCODONE HCL ER 10 MG PO T12A: 10.0000 mg | EXTENDED_RELEASE_TABLET | Freq: Two times a day (BID) | ORAL | Status: DC

## 2013-05-03 NOTE — Plan of Care (Cosign Needed)
Gm stain from pleural fluid called by RN- will continue empiric tx  Junious Silk, ANP

## 2013-05-03 NOTE — ED Notes (Signed)
Pt c/o SOB x 1 wk , bloating, and vomiting x 3 days.  Pt has pancreatic cancer.

## 2013-05-03 NOTE — Procedures (Signed)
Thoracentesis Procedure Note  Pre-operative Diagnosis: large rt effusion, r/o malignant  Post-operative Diagnosis: same  Indications: sob, diag and therapeutric  Procedure Details  Consent: Informed consent was obtained. Risks of the procedure were discussed including: infection, bleeding, pain, pneumothorax.  Under sterile conditions the patient was positioned. Betadine solution and sterile drapes were utilized.  1% buffered lidocaine was used to anesthetize the 6th rib space. Fluid was obtained without any difficulties and minimal blood loss.  A dressing was applied to the wound and wound care instructions were provided.   Findings 1.8 litersof amber dark, nonbloody pleural fluid was obtained. A sample was sent to Pathology for cytogenetics, flow, and cell counts, as well as for infection analysis.  Complications:  None; patient tolerated the procedure well.          Condition: stable  Plan A follow up chest x-ray was ordered. Bed Rest for 5 hours. Tylenol 650 mg. for pain.  Attending Attestation: I performed the procedure.  US guidance  Mcarthur Rossetti. Tyson Alias, MD, FACP Pgr: [phone] [facility] Blood loss less 1 cc

## 2013-05-03 NOTE — Progress Notes (Signed)
CRITICAL VALUE ALERT  Critical value received:  Gram Stain   Date of notification:  05/03/2013   Time of notification:  2048  Critical value read back:yes  Nurse who received alert:  Carollee Herter  MD notified (1st page):  Rennis Harding  Time of first page:  2100  MD notified (2nd page):  Time of second page:  Responding MD:  Rennis Harding  Time MD responded:

## 2013-05-03 NOTE — H&P (Signed)
[facility] History and Physical  Philip Shaw WUJ:[id number] DOB: 02/22/1935 DOA: 05/03/2013  Referring physician: Dr Juleen China PCP: [name], MD  Specialists: Dr Arbutus Ped  Chief Complaint: nausea and vomiting since last Tuesday.   HPI: Philip Shaw is a 78 y.o. male with prior h/o metastatic pancreatic cancer , hypertension, DM, was brought in by family for persistent nausea and vomiting. After arrival to ED, he was found hypoxic, dyspneic, and his oxygen sats were in low 80's. His CXR revealed large right pleural effusion. He denies any chest pain or back pain. He is on 2 to3 lit of [place] oxygen with sats in 91%. He also reports left quadrant  abdominal pain and adbominal distention. His abdominal films show mod degree of SBO. He is being admitted to medical service for further evaluation and management. PCCM consulted for thoracocentesis and surgery consulted for SBO,.   Review of Systems: The patient denies  fever, , vision loss, decreased hearing, hoarseness, chest pain, syncope,  peripheral edema, balance deficits, hemoptysis,  melena, hematochezia, severe indigestion/heartburn, hematuria, incontinence, genital sores, muscle weakness, suspicious skin lesions, transient blindness, difficulty walking, depression, unusual weight change, abnormal bleeding, enlarged lymph nodes, angioedema, and breast masses.   Past Medical History  Diagnosis Date  . Heart murmur   . Abdominal pain, periumbilical   . Constipation   . Diabetes mellitus   . Hypertension   . Sleep apnea     pt states he no longer uses his cpap - lost weight  . pancreatic ca dx'd 04/2010    METASTATIC PANCREATIC CA WITH METS TO LIVER AND ABDOMINAL WALL  -chemo every 2 weeks - ONCOLOGIST DR. MOHAMED MOHAMED WITH [facility].  Marland Kitchen Port-a-cath in place     RIGHT UPPER CHEST - FOR CHEMO EVERY 2 WEEKS   Past Surgical History  Procedure Laterality Date  . Appendectomy  19-4 years old  . Pancreatectomy   06/2010    with splenectomy  . Inguinal hernia repair  as infant, age 21    LIH- infant, RIH age 19  . Knee arthroscopy  04/23/2012    Procedure: ARTHROSCOPY KNEE;  Surgeon: Loanne Drilling, MD;  Location: [facility];  Service: Orthopedics;  Laterality: Right;  with Debridement  . Splenectomy, total     Social History:  reports that he quit smoking about 41 years ago. He has never used smokeless tobacco. He reports that he does not drink alcohol or use illicit drugs.  where does patient live--home Can patient participate in ADLs?  No Known Allergies  Family History  Problem Relation Age of Onset  . Breast cancer Sister   . Throat cancer Father     Prior to Admission medications   Medication Sig Start Date End Date Taking? Authorizing Provider  Alum & Mag Hydroxide-Simeth (MAGIC MOUTHWASH) SOLN Take 5 mLs by mouth QID. 01/21/13  Yes Si Gaul, MD  aspirin 81 MG tablet Take 81 mg by mouth every morning.    Yes Historical Provider, MD  gabapentin (NEURONTIN) 100 MG capsule Take 100 mg by mouth 2 (two) times daily.  10/18/12  Yes Adrena E Johnson, PA-C  insulin glargine (LANTUS) 100 UNIT/ML injection Inject 24 Units into the skin at bedtime.   Yes Historical Provider, MD  lidocaine (XYLOCAINE) 2 % solution TAKE BY MOUTH FOUR TIMES DAILY 01/21/13  Yes Si Gaul, MD  lidocaine-prilocaine (EMLA) cream Apply 1 application topically as needed. 04/26/13  Yes Si Gaul, MD  mirtazapine (REMERON) 30 MG  tablet TAKE 1 TABLET BY MOUTH EVERY NIGHT AT BEDTIME 04/26/13  Yes Si Gaul, MD  ondansetron (ZOFRAN) 8 MG tablet Take 1 tablet (8 mg total) by mouth every 12 (twelve) hours as needed for nausea. 04/26/13  Yes Si Gaul, MD  oxyCODONE (OXY IR/ROXICODONE) 5 MG immediate release tablet Take 1 tablet (5 mg total) by mouth every 4 (four) hours as needed. 10/13/11  Yes Si Gaul, MD  oxyCODONE (OXYCONTIN) 10 MG 12 hr tablet Take 1 tablet (10 mg total) by mouth every 12 (twelve)  hours. 02/14/13-pt states he only takes one /day 04/04/13  Yes Adrena E Johnson, PA-C  ranitidine (ZANTAC) 150 MG tablet Take 150 mg by mouth 2 (two) times daily.  05/22/11  Yes Historical Provider, MD  tamsulosin (FLOMAX) 0.4 MG CAPS Take 0.4 mg by mouth 2 (two) times daily.   Yes Historical Provider, MD  terazosin (HYTRIN) 2 MG capsule Take 2 mg by mouth at bedtime.  03/22/13  Yes Historical Provider, MD   Physical Exam: Filed Vitals:   05/03/13 1400  BP: 148/82  Pulse: 106  Temp: 98.3 F (36.8 C)  Resp: 21    Constitutional: Vital signs reviewed.  Patient is a well-developed and poorly-nourished  in no acute distress and cooperative with exam. Alert and oriented x3.  Head: Normocephalic and atraumati Mouth: no erythema or exudates, MMM Eyes: PERRL, EOMI, conjunctivae normal, No scleral icterus.  Neck: Supple, Trachea midline normal ROM, No JVD, mass, thyromegaly,   Cardiovascular: RRR, S1 normal, S2 normal, no MRG,  Pulmonary/Chest: decreased on the right side, good air entry on the left.  Abdominal: firm, tender int he left upper quadrant and lower belly. BS decreased.   Musculoskeletal: No joint deformities, erythema, or stiffness, ROM full and no nontender Neurological: A&O x3, speech normal, able to move all extremities. No facial droop.  Skin: Warm, dry and intact. No rash, cyanosis, or clubbing.  Psychiatric: Normal mood and affect. speech and behavior is normal.    Labs on Admission:  Basic Metabolic Panel:  Recent Labs Lab 05/03/13 1230  NA 131*  K 3.9  CL 83*  CO2 35*  GLUCOSE 115*  BUN 30*  CREATININE 0.99  CALCIUM 9.5   Liver Function Tests: No results found for this basename: AST, ALT, ALKPHOS, BILITOT, PROT, ALBUMIN,  in the last 168 hours No results found for this basename: LIPASE, AMYLASE,  in the last 168 hours No results found for this basename: AMMONIA,  in the last 168 hours CBC:  Recent Labs Lab 05/03/13 1230  WBC 1.5*  1.5*  NEUTROABS 0.9*   HGB 9.2*  9.3*  HCT 27.7*  28.0*  MCV 87.9  87.8  PLT 307  310   Cardiac Enzymes: No results found for this basename: CKTOTAL, CKMB, CKMBINDEX, TROPONINI,  in the last 168 hours  BNP (last 3 results) No results found for this basename: PROBNP,  in the last 8760 hours CBG: No results found for this basename: GLUCAP,  in the last 168 hours  Radiological Exams on Admission: Dg Chest 2 View (if Patient Has Fever And/or Copd)  05/03/2013   *RADIOLOGY REPORT*  Clinical Data: Shortness of breath, vomiting  CHEST - 2 VIEW  Comparison: 03/11/2013  Findings: There is large right pleural effusion with atelectasis or infiltrate in the right lower lobe.  Atelectasis is noted also in the right upper lobe.  There is only aeration of the upper segments of the right upper lobe.  Left lung is clear.  Right IJ Port-A-Cath with tip in SVC.  No pulmonary edema.  IMPRESSION: Large right pleural effusion with atelectasis or infiltrate in the right midlung and right lower lobe.  Left lung is clear.  No pulmonary edema.   Original Report Authenticated By: Natasha Mead, M.D.    EKG: not done  Assessment/Plan Active Problems: 1. Persistent Nausea and vomiting:  - abdominal films revealed high grade SBO.  - NG TUBE to be placed. NPO.  - surgery consulted.  - hydration with IV fluids.  - pain and anti emetics.    2. Large right pleural effusion: ? infectiious vs malignant vs passive - admitted to telemetry. Requiring 2 to 3 lit of [place] oxygen.  - initially thought IR would do thoracocentesis, but unfortunately it was not done. Will request PCCm for thoracocentesis.  - initial CXR showed possible infiltrate vs atelectasis , would start him on IV antibiotics empirically - repeat CXR in am after thoracocentesis.  - labs ordered for gram stain, culture, glucose, protein, ph and cytology for diagnostic .    3. Metastatic pancreatic cancer: - further management as per oncology. - spoke to Dr Arbutus Ped and he is  aware of pt's admission.   4. Hyponatremia: probably from dehydration.   5. Anemia:STABLE.   6. Malnutrition: nutrition consulted.   DVT prophylaxis    Code Status: full code Family Communication: wife and daughter at bedside Disposition Plan: pending Time spent: 90 minutes.   [facility] Pager [phone]  If 7PM-7AM, please contact night-coverage [email or web address] Password [email or web address] 05/03/2013, 3:28 PM

## 2013-05-03 NOTE — ED Notes (Signed)
MD at bedside.  EDP Kohut evaluating pt - witnessed vomiting 1000cc bile fliud

## 2013-05-03 NOTE — Telephone Encounter (Signed)
I returned daughter's call . Her dad is in [facility] ed with coughing, sob, and vomiting. Z-pak given last wee,. Dr Arbutus Ped notified.

## 2013-05-03 NOTE — ED Notes (Signed)
Patient transported to Ultrasound 

## 2013-05-03 NOTE — ED Provider Notes (Addendum)
CSN: [id number]     Arrival date & time 05/03/13  1117 History   First MD Initiated Contact with Patient 05/03/13 1135     Chief Complaint  Patient presents with  . Shortness of Breath  . Bloated  . Emesis   (Consider location/radiation/quality/duration/timing/severity/associated sxs/prior Treatment) HPI   78yM with n/v and dyspnea. Hx significant for pancreatic adenocarcinoma w/ mets to the liver and the abdominal wall, currently getting chemo. He reports worsening symptoms over the past few days. He feels bloated nauseated and has vomited several times. Denies any abdominal pain. Has been progressively feeling more and more short of breath over the past several days as well. He was initially only short of breath with exertion now he feels short of breath even at rest. He denies any chest pain. No fevers or chills. Occasional cough. No diarrhea. Surgical hx significant for inguinal hernia repair, appendectomy and splenectomy/pancreatectomy.   Past Medical History  Diagnosis Date  . Heart murmur   . Abdominal pain, periumbilical   . Constipation   . Diabetes mellitus   . Hypertension   . Sleep apnea     pt states he no longer uses his cpap - lost weight  . pancreatic ca dx'd 04/2010    METASTATIC PANCREATIC CA WITH METS TO LIVER AND ABDOMINAL WALL  -chemo every 2 weeks - ONCOLOGIST DR. MOHAMED MOHAMED WITH [facility].  Marland Kitchen Port-a-cath in place     RIGHT UPPER CHEST - FOR CHEMO EVERY 2 WEEKS   Past Surgical History  Procedure Laterality Date  . Appendectomy  56-69 years old  . Pancreatectomy  06/2010    with splenectomy  . Inguinal hernia repair  as infant, age 37    LIH- infant, RIH age 31  . Knee arthroscopy  04/23/2012    Procedure: ARTHROSCOPY KNEE;  Surgeon: Loanne Drilling, MD;  Location: [facility];  Service: Orthopedics;  Laterality: Right;  with Debridement  . Splenectomy, total     Family History  Problem Relation Age of Onset  . Breast cancer Sister   . Throat  cancer Father    History  Substance Use Topics  . Smoking status: Former Smoker -- 1.00 packs/day for 12 years    Quit date: 06/13/1971  . Smokeless tobacco: Never Used  . Alcohol Use: No    Review of Systems  All systems reviewed and negative, other than as noted in HPI.   Allergies  Review of patient's allergies indicates no known allergies.  Home Medications   Current Outpatient Rx  Name  Route  Sig  Dispense  Refill  . Alum & Mag Hydroxide-Simeth (MAGIC MOUTHWASH) SOLN   Oral   Take 5 mLs by mouth QID.   240 mL   0   . aspirin 81 MG tablet   Oral   Take 81 mg by mouth every morning.          . gabapentin (NEURONTIN) 100 MG capsule   Oral   Take 100 mg by mouth 2 (two) times daily.          . insulin glargine (LANTUS) 100 UNIT/ML injection   Subcutaneous   Inject 24 Units into the skin at bedtime.         . lidocaine (XYLOCAINE) 2 % solution      TAKE BY MOUTH FOUR TIMES DAILY   240 mL   1   . lidocaine-prilocaine (EMLA) cream   Topical   Apply 1 application topically as  needed.   30 g   2   . mirtazapine (REMERON) 30 MG tablet      TAKE 1 TABLET BY MOUTH EVERY NIGHT AT BEDTIME   30 tablet   2   . ondansetron (ZOFRAN) 8 MG tablet   Oral   Take 1 tablet (8 mg total) by mouth every 12 (twelve) hours as needed for nausea.   20 tablet   3   . oxyCODONE (OXY IR/ROXICODONE) 5 MG immediate release tablet   Oral   Take 1 tablet (5 mg total) by mouth every 4 (four) hours as needed.   30 tablet   0   . oxyCODONE (OXYCONTIN) 10 MG 12 hr tablet   Oral   Take 1 tablet (10 mg total) by mouth every 12 (twelve) hours. 02/14/13-pt states he only takes one /day   60 tablet   0   . ranitidine (ZANTAC) 150 MG tablet   Oral   Take 150 mg by mouth 2 (two) times daily.          . tamsulosin (FLOMAX) 0.4 MG CAPS   Oral   Take 0.4 mg by mouth 2 (two) times daily.         Marland Kitchen terazosin (HYTRIN) 2 MG capsule   Oral   Take 2 mg by mouth at  bedtime.           BP 121/80  Pulse 111  Temp(Src) 98.6 F (37 C) (Oral)  Resp 20  SpO2 95% Physical Exam  Nursing note and vitals reviewed. Constitutional:  Actively vomiting. Chronically ill appearing.   HENT:  Head: Normocephalic and atraumatic.  Eyes: Conjunctivae are normal. Right eye exhibits no discharge. Left eye exhibits no discharge.  Neck: Neck supple.  Cardiovascular: Normal rate, regular rhythm and normal heart sounds.  Exam reveals no gallop and no friction rub.   No murmur heard. Mild tachycardia. Port R chest.   Pulmonary/Chest: Effort normal and breath sounds normal. No respiratory distress.  Crackles b/l, decreased breath sounds R  Abdominal: Soft. He exhibits distension. There is no tenderness.  Mildly distended.No tenderness. No rebound/guarding.   Musculoskeletal: He exhibits no edema and no tenderness.  Neurological: He is alert.  Skin: Skin is warm and dry.  Psychiatric: He has a normal mood and affect. His behavior is normal. Thought content normal.    ED Course  Procedures (including critical care time) Labs Review Labs Reviewed  BASIC METABOLIC PANEL - Abnormal; Notable for the following:    Sodium 131 (*)    Chloride 83 (*)    CO2 35 (*)    Glucose, Bld 115 (*)    BUN 30 (*)    GFR calc non Af Amer 78 (*)    GFR calc Af Amer 90 (*)    All other components within normal limits  CBC - Abnormal; Notable for the following:    WBC 1.5 (*)    RBC 3.15 (*)    Hemoglobin 9.2 (*)    HCT 27.7 (*)    RDW 18.8 (*)    All other components within normal limits  CBC WITH DIFFERENTIAL - Abnormal; Notable for the following:    WBC 1.5 (*)    RBC 3.19 (*)    Hemoglobin 9.3 (*)    HCT 28.0 (*)    RDW 18.9 (*)    All other components within normal limits  BODY FLUID CULTURE  GRAM STAIN  PH, BODY FLUID  BODY FLUID CELL COUNT WITH DIFFERENTIAL  LACTATE DEHYDROGENASE, BODY FLUID  GLUCOSE, PERITONEAL FLUID  PROTEIN, BODY FLUID  URINALYSIS, ROUTINE  W REFLEX MICROSCOPIC  POCT I-STAT TROPONIN I  CYTOLOGY - NON PAP   Imaging Review Dg Chest 2 View (if Patient Has Fever And/or Copd)  05/03/2013   *RADIOLOGY REPORT*  Clinical Data: Shortness of breath, vomiting  CHEST - 2 VIEW  Comparison: 03/11/2013  Findings: There is large right pleural effusion with atelectasis or infiltrate in the right lower lobe.  Atelectasis is noted also in the right upper lobe.  There is only aeration of the upper segments of the right upper lobe.  Left lung is clear.  Right IJ Port-A-Cath with tip in SVC.  No pulmonary edema.  IMPRESSION: Large right pleural effusion with atelectasis or infiltrate in the right midlung and right lower lobe.  Left lung is clear.  No pulmonary edema.   Original Report Authenticated By: Natasha Mead, M.D.   EKG:  Rhythm: sinus tachycardia with PVCs Vent. rate 113 BPM PR interval 132 ms QRS duration 106 ms QT/QTc 344/472 ms ST segments: NS ST changes   MDM   1. Pleural effusion, right   2. Hypoxemia   3. Nausea and vomiting   2. Hx of metastatic pancreatic CA  78yM with dyspnea, fatigue and n/v. Large R sided pleural effusion. Likely sympathetic from intraabdominal neoplastic process. Does not appear to have known lungs mets. Will cover for possible infection particularly with leukopenia, although I feel less likely. Will check differential. Needs thoracentesis. Symptomatic tx of n/v at this time. Pt has benign abdominal exam and imaging deferred.   2:07 PM Spoke with radiology. Will do thoracentesis. Discussed with pt/wife/daughter. Updated on plan for procedure/admission.    Raeford Razor, MD 05/03/13 1412  2:31 PM Pt vomiting again in IR and being sent back to ED. Will check obs series. Re medicate and try again.   Raeford Razor, MD 05/03/13 1431

## 2013-05-03 NOTE — Progress Notes (Signed)
ANTIBIOTIC CONSULT NOTE - INITIAL  Pharmacy Consult for Vancomycin and Cefepime Indication: HCAP  No Known Allergies  Patient Measurements:   Weight: 70kg on 04/26/13  Vital Signs: Temp: 98.6 F (37 C) (09/09 1131) Temp src: Oral (09/09 1131) BP: 121/80 mmHg (09/09 1131) Pulse Rate: 111 (09/09 1131) Intake/Output from previous day:   Intake/Output from this shift: Total I/O In: -  Out: 1000 [Emesis/NG output:1000]  Labs:  Recent Labs  05/03/13 1230  WBC 1.5*  HGB 9.2*  PLT 307   The CrCl is unknown because both a height and weight (above a minimum accepted value) are required for this calculation. No results found for this basename: VANCOTROUGH, Leodis Binet, VANCORANDOM, GENTTROUGH, GENTPEAK, GENTRANDOM, TOBRATROUGH, TOBRAPEAK, TOBRARND, AMIKACINPEAK, AMIKACINTROU, AMIKACIN,  in the last 72 hours   Microbiology: Recent Results (from the past 720 hour(s))  TECHNOLOGIST REVIEW     Status: None   Collection Time    04/11/13  9:03 AM      Result Value Range Status   Technologist Review     Final   Value: Moderate acanthos and fragments, few targets, slight polychromasia    Medical History: Past Medical History  Diagnosis Date  . Heart murmur   . Abdominal pain, periumbilical   . Constipation   . Diabetes mellitus   . Hypertension   . Sleep apnea     pt states he no longer uses his cpap - lost weight  . pancreatic ca dx'd 04/2010    METASTATIC PANCREATIC CA WITH METS TO LIVER AND ABDOMINAL WALL  -chemo every 2 weeks - ONCOLOGIST DR. MOHAMED MOHAMED WITH [facility].  Marland Kitchen Port-a-cath in place     RIGHT UPPER CHEST - FOR CHEMO EVERY 2 WEEKS    Medications:   (Not in a hospital admission) Anti-infectives   None     Assessment: 78yo M with pancreatic CA presents with SOB, bloating, and emesis. Completed a cycle of chemotherapy on 9/4. Starting empiric broad-spectrum abx for suspected HCAP.  Afebrile.  WBCs low at 1.5.  Recent wt. 70kg.  On  9/2 SCr = 0.8, CrCl ~80.  Goal of Therapy:  Vancomycin trough level 15-20 mcg/ml  Plan:   Vancomycin 1g x 1 then 750mg  IV q12h.  Cefepime 2g IV q8h.  Measure Vanc trough at steady state.  Follow up renal fxn and culture results.  Charolotte Eke, PharmD, pager [phone]. 05/03/2013,1:29 PM.

## 2013-05-03 NOTE — Care Management Note (Addendum)
Page 1 of 2   05/11/2013     2:01:35 PM   CARE MANAGEMENT NOTE 05/11/2013  Patient:  Philip Shaw, Philip Shaw   Account Number:  [id number]  Date Initiated:  05/03/2013  Documentation initiated by:  Lanier Clam  Subjective/Objective Assessment:   78 Y/O M ADMITTED W/SOB.PNA.  PANCREATIC CA W/METS.     Action/Plan:   FROM HOME W/[facility].DTR-[facility] HH CM.INDEP PTA.   Anticipated DC Date:  05/11/2013   Anticipated DC Plan:  HOME W HOME HEALTH SERVICES      DC Planning Services  CM consult      [facility] Choice  HOME HEALTH  DURABLE MEDICAL EQUIPMENT   Choice offered to / List presented to:  C-4 Adult Children   DME arranged  3-N-1      DME agency  [facility]     [facility] arranged  HH-1 RN      [facility] agency  [facility]   Status of service:  Completed, signed off Medicare Important Message given?   (If response is "NO", the following Medicare IM given date fields will be blank) Date Medicare IM given:   Date Additional Medicare IM given:    Discharge Disposition:  HOME W HOME HEALTH SERVICES  Per UR Regulation:  Reviewed for med. necessity/level of care/duration of stay  If discussed at Long Length of Stay Meetings, dates discussed:   05/10/2013    Comments:  05/11/13 Secret Kristensen RN,BSN NCM [phone] HHRN-ORDERED FOR PLEURX CATH DRAIN PER PROTOCAL.[facility] DEBBIE AWARE OF D/C & HHRN ORDER.PER NSG DTR WILL MANAGE PLEURX CATH DRAIN FOR TODAY.4 PLEURX CATH DRAIN KITS FOR HOME.[facility] #[id number] TO-FINALIZED HOME ORDER PER Kadedra Vanaken @ [facility]-THEY WANT PATIENT TO CALL ONCE @HOME  TO CONFIRM ORDER,BUT IF HOME HEALTH CARE IS ORDERED @ D/C FROM HOSPITAL THE HOME CARE AGENCY WILL PROVIDE THE SUPPLIES THROUGH THEIR SUPPLIER.INFORMED PATIENT'S SPOUSE,& GAVE COPY OF [facility] ORDERTHAT HAS TEL#,DTR HAS COPY OF [facility] ORDER TO F/U ON ORDER IF CONCERNS.[facility] DME HAS DELIVERED 3N1. [facility] REP DEBBIE FOLLOWING AWAITING FINAL HHRN-PLEURX CATH DRAIN  MGMNT.AWAITING  HOME 3N1 [facility] DME REP NOTIFIED.ADVANCING DIET,WEANING TNA.PLEURX CATH KITS IN RM FOR HOME USE.WILL FINALIZE W/[facility] PLEURX CATH HOME NEEDS ONCE D/C ORDER PLACED.  05/10/13 Shariah Assad RN,BSN NCM [phone] [facility] TEL#[id number] SPOKE TO MORGAN ABOUT CURRENT STATUS OF PLEURX CATH DRAINAGE CANNISTERS ORDER FOR HOME.THEY DO NOT DELIVER TO THE HOSPITAL,THEY DELIVER TO THE HOME AFTER D/C,LIKELY NEXT DAY-3DAYS AFTER D/C.THE ADDRESS THEY WILL DELIVER TO IS THE DTR'S [address],[place] [address].THEY ARE STILL VERIFYING THE INSURANCE.WE MUST CALL DAY OF D/C WITH INFO:DATE CATH INSERTED,HOW OFTEN CATH DRAINING,HOW MANY DAYS OF CANNISTERS D/C WITH TO LAST HOW MANY DAYS.NURSE KATIE MADE AWARE OF THE NEEDS-SHE WILL ORDER 5 KITS FOR TOMORROW SINCE DRAINING 1XDAY,35OCC TODAY. AWAITING FINALL HHRN ORDER FOR PLEURX CATH DRAIN.[facility] DEBBIEW UPDATED.  [facility] DEBBIE REP FOLLOWING FOR HHRN-PLEURX CATH,AWAITING FINAL HHRN ORDER.TC [facility] TO CONFIRM PLEURX CATH SUPPLIES.TNA @ 70CC/HR GOAL.PLAN TO WEAN TNA,ADVANCING DIET-CLEARS-FULL LIQ.NO FURTHER D/C NEEDS.  05-08-13 Lorenda Ishihara RN CM 1300 Spoke with Eunice Blase with [facility], completed requisition. Faxed PleurX requistion to number provided. Anticipate d/c to home with daughter middle of the week. Awaiting consult with oncologist to discuss hospice options.  05/05/13 Bentley Fissel RN,BSN NCM [phone] SBO,NG,ICECHIPS,BOWEL REST.R PLEURAL EFFUSION-?PLEURX CATH.PCCM SIGNED OFF.DTR WORKS W/[facility].SPOKE TO DEBBIE REP [facility] AWARE OF ?PLEURX CATH WILL PROVIDE HH IF NEEDED.PATIENT WILL STAY W/DTR @ D/C.AWAIT FINAL HH  ORDERS IF NEEDED.  05/04/13 Gracin Mcpartland RN,BSN NCM [phone] S/P THORACENTESIS.NGT-LIS,BOWEL REST.NO ANTICIPATED D/C NEEDS.  05/03/13 Eathan Groman RN,BSN NCM [phone].

## 2013-05-04 ENCOUNTER — Other Ambulatory Visit (HOSPITAL_COMMUNITY): Payer: Medicare Other

## 2013-05-04 ENCOUNTER — Ambulatory Visit (HOSPITAL_COMMUNITY): Payer: Medicare Other

## 2013-05-04 ENCOUNTER — Inpatient Hospital Stay (HOSPITAL_COMMUNITY): Payer: Medicare Other

## 2013-05-04 ENCOUNTER — Other Ambulatory Visit: Payer: Self-pay | Admitting: Radiology

## 2013-05-04 DIAGNOSIS — C7889 Secondary malignant neoplasm of other digestive organs: Secondary | ICD-10-CM

## 2013-05-04 DIAGNOSIS — K56609 Unspecified intestinal obstruction, unspecified as to partial versus complete obstruction: Principal | ICD-10-CM

## 2013-05-04 DIAGNOSIS — E440 Moderate protein-calorie malnutrition: Secondary | ICD-10-CM | POA: Insufficient documentation

## 2013-05-04 DIAGNOSIS — D6481 Anemia due to antineoplastic chemotherapy: Secondary | ICD-10-CM

## 2013-05-04 DIAGNOSIS — J90 Pleural effusion, not elsewhere classified: Secondary | ICD-10-CM

## 2013-05-04 DIAGNOSIS — D638 Anemia in other chronic diseases classified elsewhere: Secondary | ICD-10-CM

## 2013-05-04 DIAGNOSIS — R0602 Shortness of breath: Secondary | ICD-10-CM

## 2013-05-04 LAB — BASIC METABOLIC PANEL
Calcium: 8.3 mg/dL — ABNORMAL LOW (ref 8.4–10.5)
Chloride: 89 mEq/L — ABNORMAL LOW (ref 96–112)
Creatinine, Ser: 1.26 mg/dL (ref 0.50–1.35)
GFR calc Af Amer: 62 mL/min — ABNORMAL LOW (ref >90)
Sodium: 131 mEq/L — ABNORMAL LOW (ref 135–145)

## 2013-05-04 LAB — GLUCOSE, CAPILLARY
Glucose-Capillary: 159 mg/dL — ABNORMAL HIGH (ref 70–99)
Glucose-Capillary: 138 mg/dL — ABNORMAL HIGH (ref 70–99)
Glucose-Capillary: 88 mg/dL (ref 70–99)
Glucose-Capillary: 65 mg/dL — ABNORMAL LOW (ref 70–99)
Glucose-Capillary: 116 mg/dL — ABNORMAL HIGH (ref 70–99)

## 2013-05-04 LAB — PH, BODY FLUID: pH, Fluid: 8.5

## 2013-05-04 LAB — CBC
Platelets: 261 10*3/uL (ref 150–400)
RDW: 18.8 % — ABNORMAL HIGH (ref 11.5–15.5)
WBC: 2.4 10*3/uL — ABNORMAL LOW (ref 4.0–10.5)

## 2013-05-04 LAB — LACTATE DEHYDROGENASE: LDH: 279 U/L — ABNORMAL HIGH (ref 94–250)

## 2013-05-04 LAB — BODY FLUID CELL COUNT WITH DIFFERENTIAL
Eos, Fluid: 6 %
Total Nucleated Cell Count, Fluid: 195 cu mm (ref 0–1000)

## 2013-05-04 MED ORDER — GABAPENTIN 250 MG/5ML PO SOLN
100.0000 mg | Freq: Two times a day (BID) | ORAL | Status: DC
  Filled 2013-05-04: qty 2

## 2013-05-04 MED ORDER — HEPARIN SOD (PORK) LOCK FLUSH 100 UNIT/ML IV SOLN
250.0000 [IU] | INTRAVENOUS | Status: DC | PRN
  Filled 2013-05-04: qty 3

## 2013-05-04 MED ORDER — INSULIN GLARGINE 100 UNIT/ML ~~LOC~~ SOLN
24.0000 [IU] | Freq: Every day | SUBCUTANEOUS | Status: DC
  Administered 2013-05-04 – 2013-05-05 (×2): 24 [IU] via SUBCUTANEOUS
  Filled 2013-05-04 (×2): qty 0.24

## 2013-05-04 MED ORDER — INSULIN GLARGINE 100 UNIT/ML ~~LOC~~ SOLN: 12.0000 [IU] | Freq: Every day | SUBCUTANEOUS | Status: DC

## 2013-05-04 MED ORDER — ACETAMINOPHEN 325 MG PO TABS
650.0000 mg | ORAL_TABLET | Freq: Once | ORAL | Status: AC
  Administered 2013-05-04: 650 mg via ORAL
  Filled 2013-05-04: qty 2

## 2013-05-04 MED ORDER — DIPHENHYDRAMINE HCL 50 MG/ML IJ SOLN
25.0000 mg | Freq: Once | INTRAMUSCULAR | Status: AC
Start: 2013-05-04 — End: 2013-05-04
  Administered 2013-05-04: 25 mg via INTRAVENOUS
  Filled 2013-05-04: qty 1

## 2013-05-04 MED ORDER — DEXTROSE 50 % IV SOLN
25.0000 mL | Freq: Once | INTRAVENOUS | Status: AC | PRN
  Administered 2013-05-04: 25 mL via INTRAVENOUS
  Filled 2013-05-04: qty 50

## 2013-05-04 MED ORDER — HEPARIN SOD (PORK) LOCK FLUSH 100 UNIT/ML IV SOLN
500.0000 [IU] | Freq: Every day | INTRAVENOUS | Status: AC | PRN
  Administered 2013-05-11: 500 [IU]
  Filled 2013-05-04: qty 5

## 2013-05-04 MED ORDER — DEXTROSE 5 % IV SOLN
2.0000 g | Freq: Two times a day (BID) | INTRAVENOUS | Status: DC
  Administered 2013-05-04 – 2013-05-05 (×2): 2 g via INTRAVENOUS
  Filled 2013-05-04 (×4): qty 2

## 2013-05-04 MED ORDER — DEXTROSE-NACL 5-0.9 % IV SOLN
INTRAVENOUS | Status: AC
  Administered 2013-05-04 – 2013-05-07 (×4): via INTRAVENOUS

## 2013-05-04 MED ORDER — SODIUM CHLORIDE 0.9 % IJ SOLN: 3.0000 mL | INTRAMUSCULAR | Status: DC | PRN

## 2013-05-04 MED ORDER — IOHEXOL 300 MG/ML  SOLN
25.0000 mL | INTRAMUSCULAR | Status: AC
  Administered 2013-05-04 (×2): 25 mL via ORAL

## 2013-05-04 MED ORDER — FUROSEMIDE 10 MG/ML IJ SOLN
20.0000 mg | Freq: Once | INTRAMUSCULAR | Status: AC
  Administered 2013-05-04: 18:00:00 20 mg via INTRAVENOUS
  Filled 2013-05-04: qty 2

## 2013-05-04 MED ORDER — SODIUM CHLORIDE 0.9 % IJ SOLN: 10.0000 mL | INTRAMUSCULAR | Status: DC | PRN

## 2013-05-04 MED ORDER — IOHEXOL 300 MG/ML  SOLN
100.0000 mL | Freq: Once | INTRAMUSCULAR | Status: AC | PRN
  Administered 2013-05-04: 100 mL via INTRAVENOUS

## 2013-05-04 MED ORDER — SODIUM CHLORIDE 0.9 % IV SOLN: 250.0000 mL | Freq: Once | INTRAVENOUS | Status: AC

## 2013-05-04 MED ORDER — SODIUM CHLORIDE 0.9 % IJ SOLN
10.0000 mL | INTRAMUSCULAR | Status: DC | PRN
  Administered 2013-05-04: 05:00:00 10 mL

## 2013-05-04 NOTE — Clinical Documentation Improvement (Signed)
THIS DOCUMENT IS NOT A PERMANENT PART OF THE MEDICAL RECORD  Please update your documentation with the medical record to reflect your response to this query. If you need help knowing how to do this please call [phone].  05/04/13   Dear Dr. Philip Aspen / Associates,  In a better effort to capture your patient's severity of illness, reflect appropriate length of stay and utilization of resources, a review of the patient medical record has revealed the following indicators.    Based on your clinical judgment, please clarify and document in a progress note and/or discharge summary the clinical condition associated with the following supporting information:  In responding to this query please exercise your independent judgment.  The fact that a query is asked, does not imply that any particular answer is desired or expected.  Pt with pleural effusion,  Clarification Needed    Please clarify if pleural effusion in setting of metastatic disease can be further specified as one of the diagnoses listed below or other diagnosis and document in pn or d/c summary.     Possible Clinical Conditions?  Bacterial pleural Effusion  Malignant Pleural Effusion   ____________________ _______Other Condition__________________ _______Cannot Clinically Determine   Supporting Information:  Risk Factors:  SBO  Pleural Effusion  Cachexia  Malignant liver ca  CA of pancreas  Hyponatremia  Signs & Symptoms: CN 05/04/13  His CXR revealed large right pleural effusion. He was admitted by medicine and a large right pleural effusion was tapped, 1.8 liters.  CN 05/03/13 Data Reviewed:  IMP: SBO, likely from his metastatic disease; large pleural effusion, ?   Diagnostics: Chest Xray 05/03/13 IMPRESSION: Large right pleural effusion with atelectasis or infiltrate in the right midlung and right lower lobe.  Treatment Thoracentesis  You may use possible, probable, or suspect with  inpatient documentation. possible, probable, suspected diagnoses MUST be documented at the time of discharge  Reviewed:  no additional documentation provided  Thank You,  Enis Slipper RN, BSN, MSN/Inf, CCDS Clinical Documentation Specialist [facility] HIM Dept Pager: [phone] / E-mail: [email or web address]  [phone] Health Information Management [place]

## 2013-05-04 NOTE — Progress Notes (Addendum)
[facility] PROGRESS NOTE  ARDIAN HABERLAND JWJ:[id number] DOB: Jan 08, 1935 DOA: 05/03/2013 PCP: [name], MD  Assessment/Plan: SBO -Likely from progression of cancer. -Appreciate surgical input. -Continue NG decompression. -Review CT scans and possibly repeat abdominal films in the morning to determine when appropriate to attempt trials of clears.  Large Exudative Right Pleural Effusion -S/p large volume thoracentesis on 9/9 of 2 L. -May need pleurex catheter at the discretion of pulmonary. -Greatly appreciate Dr. Gwendolyn Grant input. -CT Chest done today with results pending. -On antibiotics for ?PNA. CT chest should help clarify.  Metastatic Pancreatic Cancer -As per oncology. -CT Abdomen pending.  AOCD -Hb is 7.9 today. -Onc has ordered transfusion of 2 units of PRBCs.   Code Status: Full Code Family Communication: Wife and daughter at bedside updated on plan of care.  Disposition Plan: Home when medically ready. Anticipate a few more days in the hospital.   Consultants:  Surgery  Pulmonary   Antibiotics:  Vanc  Cefepime   Subjective: Feels much better today, Less SOB/nausea and decreased abdominal pain.  Objective: Filed Vitals:   05/03/13 2010 05/04/13 0439 05/04/13 1445 05/04/13 1505  BP: 132/71 105/65 124/58 107/64  Pulse: 105 82 87 91  Temp: 98.8 F (37.1 C) 97.4 F (36.3 C) 97.9 F (36.6 C) 97.7 F (36.5 C)  TempSrc: Oral Oral Oral Oral  Resp: 18 18 18 18   Height:      Weight:      SpO2: 97% 95% 96% 98%    Intake/Output Summary (Last 24 hours) at 05/04/13 1601 Last data filed at 05/04/13 1505  Gross per 24 hour  Intake  315.5 ml  Output   1000 ml  Net -684.5 ml   Filed Weights   05/03/13 1538 05/03/13 1841  Weight: 69.6 kg (153 lb 7 oz) 65.4 kg (144 lb 2.9 oz)    Exam:   General:  AA Ox3  Cardiovascular: RRR, no M/R/G  Respiratory: decreased BS right base  Abdomen: S/ND/+BS  Extremities: 1+edema   Neurologic:   Non-focal.  Data Reviewed: Basic Metabolic Panel:  Recent Labs Lab 05/03/13 1230 05/04/13 0455  NA 131* 131*  K 3.9 3.5  CL 83* 89*  CO2 35* 36*  GLUCOSE 115* 167*  BUN 30* 36*  CREATININE 0.99 1.26  CALCIUM 9.5 8.3*   Liver Function Tests: No results found for this basename: AST, ALT, ALKPHOS, BILITOT, PROT, ALBUMIN,  in the last 168 hours No results found for this basename: LIPASE, AMYLASE,  in the last 168 hours No results found for this basename: AMMONIA,  in the last 168 hours CBC:  Recent Labs Lab 05/03/13 1230 05/04/13 0455  WBC 1.5*  1.5* 2.4*  NEUTROABS 0.9*  --   HGB 9.2*  9.3* 7.9*  HCT 27.7*  28.0* 24.2*  MCV 87.9  87.8 88.6  PLT 307  310 261   Cardiac Enzymes: No results found for this basename: CKTOTAL, CKMB, CKMBINDEX, TROPONINI,  in the last 168 hours BNP (last 3 results) No results found for this basename: PROBNP,  in the last 8760 hours CBG:  Recent Labs Lab 05/03/13 2007 05/03/13 2356 05/04/13 0411 05/04/13 0752 05/04/13 1153  GLUCAP 182* 186* 159* 138* 88    Recent Results (from the past 240 hour(s))  BODY FLUID CULTURE     Status: None   Collection Time    05/03/13  7:01 PM      Result Value Range Status   Specimen Description THORACENTESIS   Final   Special  Requests Immunocompromised   Final   Gram Stain     Final   Value: WBC PRESENT,BOTH PMN AND MONONUCLEAR     NO ORGANISMS SEEN     Performed by [facility]     Performed at [facility]   Culture PENDING   Incomplete   Report Status PENDING   Incomplete  GRAM STAIN     Status: None   Collection Time    05/03/13  7:02 PM      Result Value Range Status   Specimen Description THORACENTESIS   Final   Special Requests Immunocompromised   Final   Gram Stain     Final   Value: CYTOSPIN     NO ORGANISMS SEEN     WBC PRESENT,BOTH PMN AND MONONUCLEAR     Gram Stain Report Called to,Read Back By and Verified With: S YOUNG RN 2048 05/03/13 A NAVARRO   Report  Status 05/03/2013 FINAL   Final     Studies: Dg Chest 2 View (if Patient Has Fever And/or Copd)  05/03/2013   *RADIOLOGY REPORT*  Clinical Data: Shortness of breath, vomiting  CHEST - 2 VIEW  Comparison: 03/11/2013  Findings: There is large right pleural effusion with atelectasis or infiltrate in the right lower lobe.  Atelectasis is noted also in the right upper lobe.  There is only aeration of the upper segments of the right upper lobe.  Left lung is clear.  Right IJ Port-A-Cath with tip in SVC.  No pulmonary edema.  IMPRESSION: Large right pleural effusion with atelectasis or infiltrate in the right midlung and right lower lobe.  Left lung is clear.  No pulmonary edema.   Original Report Authenticated By: Natasha Mead, M.D.   Dg Chest Port 1 View  05/04/2013   CLINICAL DATA:  Evaluate pleural effusion. Shortness of Breath. Weakness.  EXAM: PORTABLE CHEST - 1 VIEW  COMPARISON:  None 914  FINDINGS: Endotracheal tube terminates 4.8 cm above Carina. Nasogastric tube extends beyond the inferior aspect of the film.  Right internal jugular line terminates at mid SVC.  The chin overlies the apices. Cardiomegaly, exaggerated by AP portable technique. Right sided pleural effusion is moderate and not significantly changed. The left pleural effusion. No significant change in airspace disease within the right mid and lower lung. Left lung remains clear.  IMPRESSION: No significant change in moderate right-sided pleural effusion and adjacent airspace opacity.  Interval placement of endotracheal tube, terminating 4.8 cm above Carina.   Electronically Signed   By: Jeronimo Greaves   On: 05/04/2013 14:32   Dg Chest Port 1 View  05/03/2013   CLINICAL DATA:  Right pleural effusion. Status post thoracentesis.  EXAM: PORTABLE CHEST - 1 VIEW  COMPARISON:  05/03/2013  FINDINGS: A moderate right pleural effusion shows interval decrease in size. No pneumothorax identified. Right lower lung atelectasis or consolidation again  demonstrated. Left lung is clear. Heart size is stable. Right-sided power port remains in appropriate position. A new nasogastric tube is seen with tip in the proximal stomach.  IMPRESSION: Decreased size of right pleural effusion. No pneumothorax identified. Persistent right lower lung atelectasis versus consolidation.   Electronically Signed   By: Myles Rosenthal   On: 05/03/2013 19:08   Dg Abd Portable 2v  05/03/2013   *RADIOLOGY REPORT*  Clinical Data: Nausea, vomiting, bloating, question small bowel obstruction  PORTABLE ABDOMEN - 2 VIEW  Comparison: None Correlation:  CT abdomen pelvis 03/11/2013  Findings: Gaseous distention of stomach. Markedly  distended small bowel loops up to 7.2 cm diameter. Decompressed distal small bowel loops in colon. Findings are consistent with a high-grade proximal to mid small bowel obstruction. No definite free intraperitoneal air. Bones diffusely demineralized. Nonobstructing right renal calculi.  IMPRESSION: Marked distention of proximal small bowel loops and gaseous distention of stomach consistent with a high-grade proximal to mid small bowel obstruction.   Original Report Authenticated By: Ulyses Southward, M.D.    Scheduled Meds: . ceFEPime (MAXIPIME) IV  2 g Intravenous Q12H  . enoxaparin (LOVENOX) injection  40 mg Subcutaneous Q24H  . famotidine (PEPCID) IV  20 mg Intravenous Q12H  . furosemide  20 mg Intravenous Once  . gabapentin  100 mg Oral BID  . insulin glargine  24 Units Subcutaneous QHS  . mirtazapine  7.5 mg Oral QHS  . sodium chloride  3 mL Intravenous Q12H  . tamsulosin  0.4 mg Oral BID  . vancomycin  750 mg Intravenous Q12H   Continuous Infusions:   Active Problems:   Malnutrition of moderate degree    Time spent: 35 minutes.    Chaya Jan  [facility] Pager [phone]  If 7PM-7AM, please contact night-coverage at [email or web address], password [facility] 05/04/2013, 4:01 PM  LOS: 1 day

## 2013-05-04 NOTE — Progress Notes (Signed)
INITIAL NUTRITION ASSESSMENT  DOCUMENTATION CODES Per approved criteria  -Non-severe (moderate) malnutrition in the context of chronic illness  Pt meets criteria for Moderate MALNUTRITION in the context of Chronic as evidenced by 9% wt loss in less than 3 months and signs of mild/moderate muscle and fat wasting evidenced in physical exam.  INTERVENTION: Diet advancement per MD discretion RD to monitor and add supplements for clear liquid diet Family to provide protein shakes once diet advanced to full liquid or regular diet  NUTRITION DIAGNOSIS: Inadequate oral intake related to inability to eat as evidenced by NPO status.   Goal: Pt to meet >/= 90% of their estimated nutrition needs   Monitor:  Diet advancement Bowel Function PO intake Weight Labs  Reason for Assessment: Malnutrition Screening Tool (MST), score of 3  78 y.o. male  Admitting Dx: Pleural Effusion and SBO  ASSESSMENT: 78 y.o. male with prior h/o metastatic pancreatic cancer , hypertension, DM, was brought in by family for persistent nausea and vomiting. After arrival to ED, he was found hypoxic, dyspneic, and his oxygen sats were in low 80's. His CXR revealed large right pleural effusion. He also reports left quadrant abdominal pain and adbominal distention. His abdominal films show mod degree of SBO.  Pt reports having nausea and vomiting since Sunday. He states he weighed 153 lbs last week. Per wt history pt has had 9% wt loss in less than 3 months. Pt reports that he usually drinks 9 protein shakes daily (protein powder, milk, fruit) and eats very little solid food. Pt is followed by cancer dietitian, Vernell Leep. Pt reports feeling better today- nausea improved, no vomiting today, and breathing easier.  Pt's daughter reports that pt is unable to tolerate Ensure or Boost supplements; she will provide pt's protein drinks once pt's diet is advanced.   Height: Ht Readings from Last 1 Encounters:  05/03/13 5\' 7"   (1.702 m)    Weight: Wt Readings from Last 1 Encounters:  05/03/13 144 lb 2.9 oz (65.4 kg)    Ideal Body Weight: 148 lbs  % Ideal Body Weight: 97%  Wt Readings from Last 10 Encounters:  05/03/13 144 lb 2.9 oz (65.4 kg)  04/26/13 153 lb 6.4 oz (69.582 kg)  04/04/13 152 lb 3.2 oz (69.037 kg)  03/21/13 152 lb 14.4 oz (69.355 kg)  03/07/13 154 lb 4.8 oz (69.99 kg)  02/14/13 158 lb 12.8 oz (72.031 kg)  01/24/13 157 lb 14.4 oz (71.623 kg)  01/10/13 158 lb 12.8 oz (72.031 kg)  01/07/13 158 lb 8.2 oz (71.9 kg)  12/20/12 159 lb 4.8 oz (72.258 kg)    Usual Body Weight: 153-165 lbs  % Usual Body Weight: 87-94%  BMI:  Body mass index is 22.58 kg/(m^2).  Estimated Nutritional Needs: Kcal: 1700-1900 Protein: 80-90 grams Fluid: 1.9 L/day  Skin: WDL  Nutrition Focused Physical Exam:  Subcutaneous Fat:  Orbital Region: wnl Upper Arm Region: mild wasting Thoracic and Lumbar Region: NA  Muscle:  Temple Region: moderate wasting Clavicle Bone Region: mild wasting Clavicle and Acromion Bone Region: mild wasting Scapular Bone Region: NA Dorsal Hand: moderate wasting Patellar Region: moderate wasting Anterior Thigh Region: moderate wasting Posterior Calf Region: mild wasting  Edema: none  Diet Order: NPO  EDUCATION NEEDS: -No education needs identified at this time   Intake/Output Summary (Last 24 hours) at 05/04/13 1039 Last data filed at 05/04/13 0700  Gross per 24 hour  Intake    303 ml  Output   1700 ml  Net  -  1397 ml    Last BM: 9/7   Labs:   Recent Labs Lab 05/03/13 1230 05/04/13 0455  NA 131* 131*  K 3.9 3.5  CL 83* 89*  CO2 35* 36*  BUN 30* 36*  CREATININE 0.99 1.26  CALCIUM 9.5 8.3*  GLUCOSE 115* 167*    CBG (last 3)   Recent Labs  05/03/13 2356 05/04/13 0411 05/04/13 0752  GLUCAP 186* 159* 138*    Scheduled Meds: . acetaminophen  650 mg Oral Once  . ceFEPime (MAXIPIME) IV  2 g Intravenous Q8H  . diphenhydrAMINE  25 mg  Intravenous Once  . enoxaparin (LOVENOX) injection  40 mg Subcutaneous Q24H  . famotidine (PEPCID) IV  20 mg Intravenous Q12H  . furosemide  20 mg Intravenous Once  . gabapentin  100 mg Oral BID  . insulin glargine  24 Units Subcutaneous QHS  . mirtazapine  7.5 mg Oral QHS  . sodium chloride  3 mL Intravenous Q12H  . tamsulosin  0.4 mg Oral BID  . vancomycin  750 mg Intravenous Q12H    Continuous Infusions:   Past Medical History  Diagnosis Date  . Heart murmur   . Abdominal pain, periumbilical   . Constipation   . Diabetes mellitus   . Hypertension   . Sleep apnea     pt states he no longer uses his cpap - lost weight  . pancreatic ca dx'd 04/2010    METASTATIC PANCREATIC CA WITH METS TO LIVER AND ABDOMINAL WALL  -chemo every 2 weeks - ONCOLOGIST DR. MOHAMED MOHAMED WITH [facility].  Marland Kitchen Port-a-cath in place     RIGHT UPPER CHEST - FOR CHEMO EVERY 2 WEEKS    Past Surgical History  Procedure Laterality Date  . Appendectomy  87-76 years old  . Pancreatectomy  06/2010    with splenectomy  . Inguinal hernia repair  as infant, age 59    LIH- infant, RIH age 88  . Knee arthroscopy  04/23/2012    Procedure: ARTHROSCOPY KNEE;  Surgeon: Loanne Drilling, MD;  Location: [facility];  Service: Orthopedics;  Laterality: Right;  with Debridement  . Splenectomy, total     Ian Malkin RD, LDN Inpatient Clinical Dietitian Pager: [phone] After Hours Pager: [phone]

## 2013-05-04 NOTE — Consult Note (Signed)
Reason for Consult:SBO Referring Physician: [name] is an 78 y.o. male.  HPI: Philip Shaw is a 78 y.o. male with prior h/o metastatic pancreatic cancer Stage IIIB T3 N1M0, s/p Distal pancreectomy and spleenectomy 06/2010 on Chemo and radiation therapy. Also with a hx of hypertension, DM,  Who was brought in by family for persistent nausea and vomiting. He started having distension last weekend, it became more severe on Sunday 05/01/13, he had nausea and started vomiting.  It is also the last time he had a BM.  His family brought him to the ER last PM.  After arrival to ED, he was found hypoxic, dyspneic, and his oxygen sats were in low 80's. His CXR revealed large right pleural effusion. He denies any chest pain or back pain. He was admitted by medicine and a large right pleural effusion was tapped, 1.8 liters.  He was also found to have a large stomach and small bowel distension on films consistent with an acute SBO.  We were ask to see.   CT scan of the chest and abdomen/pelvis were done on 03/07/13 by oncology.  Chest CT showed a new moderate right sided effusion with some nodularity along the right hemidiaphragm.  CT of the abdomen showed:  Enlarging metastatic lesion in segment two of the liver.  Essentially stable appearance of stranding/infiltrative process in the left omentum, and soft tissue density along the anterior renal fascia/anterior para renal space.  We are ask to see to assist in care with SBO.  Past Medical History  Diagnosis Date  . Heart murmur   . Abdominal pain, periumbilical   . Constipation   . Diabetes mellitus   . Hypertension   . Sleep apnea     pt states he no longer uses his cpap - lost weight  . pancreatic ca dx'd 04/2010    METASTATIC PANCREATIC CA WITH METS TO LIVER AND ABDOMINAL WALL  -chemo every 2 weeks - ONCOLOGIST DR. MOHAMED MOHAMED WITH [facility].  Marland Kitchen Port-a-cath in place     RIGHT UPPER CHEST - FOR CHEMO EVERY 2 WEEKS     Past Surgical History  Procedure Laterality Date  . Appendectomy  62-33 years old  . Pancreatectomy  06/2010    with splenectomy  . Inguinal hernia repair  as infant, age 25    LIH- infant, RIH age 9  . Knee arthroscopy  04/23/2012    Procedure: ARTHROSCOPY KNEE;  Surgeon: Loanne Drilling, MD;  Location: [facility];  Service: Orthopedics;  Laterality: Right;  with Debridement  . Splenectomy, total      Family History  Problem Relation Age of Onset  . Breast cancer Sister   . Throat cancer Father     Social History:  reports that he quit smoking about 41 years ago. He has never used smokeless tobacco. He reports that he does not drink alcohol or use illicit drugs.  Allergies: No Known Allergies  Medications:  Prior to Admission:  Prescriptions prior to admission  Medication Sig Dispense Refill  . Alum & Mag Hydroxide-Simeth (MAGIC MOUTHWASH) SOLN Take 5 mLs by mouth QID.  240 mL  0  . aspirin 81 MG tablet Take 81 mg by mouth every morning.       . gabapentin (NEURONTIN) 100 MG capsule Take 100 mg by mouth 2 (two) times daily.       . insulin glargine (LANTUS) 100 UNIT/ML injection Inject 24 Units into the skin  at bedtime.      . lidocaine (XYLOCAINE) 2 % solution TAKE BY MOUTH FOUR TIMES DAILY  240 mL  1  . lidocaine-prilocaine (EMLA) cream Apply 1 application topically as needed.  30 g  2  . mirtazapine (REMERON) 30 MG tablet TAKE 1 TABLET BY MOUTH EVERY NIGHT AT BEDTIME  30 tablet  2  . ondansetron (ZOFRAN) 8 MG tablet Take 1 tablet (8 mg total) by mouth every 12 (twelve) hours as needed for nausea.  20 tablet  3  . oxyCODONE (OXY IR/ROXICODONE) 5 MG immediate release tablet Take 1 tablet (5 mg total) by mouth every 4 (four) hours as needed.  30 tablet  0  . oxyCODONE (OXYCONTIN) 10 MG 12 hr tablet Take 1 tablet (10 mg total) by mouth every 12 (twelve) hours. 02/14/13-pt states he only takes one /day  60 tablet  0  . ranitidine (ZANTAC) 150 MG tablet Take 150 mg by mouth 2  (two) times daily.       . tamsulosin (FLOMAX) 0.4 MG CAPS Take 0.4 mg by mouth 2 (two) times daily.      Marland Kitchen terazosin (HYTRIN) 2 MG capsule Take 2 mg by mouth at bedtime.        Scheduled: . ceFEPime (MAXIPIME) IV  2 g Intravenous Q8H  . enoxaparin (LOVENOX) injection  40 mg Subcutaneous Q24H  . famotidine (PEPCID) IV  20 mg Intravenous Q12H  . gabapentin  100 mg Oral BID  . insulin glargine  24 Units Subcutaneous QHS  . mirtazapine  7.5 mg Oral QHS  . sodium chloride  3 mL Intravenous Q12H  . tamsulosin  0.4 mg Oral BID  . vancomycin  750 mg Intravenous Q12H   Continuous:  [id number] (DILAUDID) injection, lidocaine, lidocaine-prilocaine, sodium chloride Anti-infectives   Start     Dose/Rate Route Frequency Ordered Stop   05/04/13 0400  vancomycin (VANCOCIN) IVPB 750 mg/150 ml premix     750 mg 150 mL/hr over 60 Minutes Intravenous Every 12 hours 05/03/13 1332     05/03/13 2200  ceFEPIme (MAXIPIME) 2 g in dextrose 5 % 50 mL IVPB     2 g 100 mL/hr over 30 Minutes Intravenous 3 times per day 05/03/13 1332     05/03/13 1500  vancomycin (VANCOCIN) IVPB 1000 mg/200 mL premix     1,000 mg 200 mL/hr over 60 Minutes Intravenous STAT 05/03/13 1332 05/03/13 1548   05/03/13 1400  ceFEPIme (MAXIPIME) 2 g in dextrose 5 % 50 mL IVPB     2 g 100 mL/hr over 30 Minutes Intravenous STAT 05/03/13 1332 05/03/13 1442      Results for orders placed during the hospital encounter of 05/03/13 (from the past 48 hour(s))  BASIC METABOLIC PANEL     Status: Abnormal   Collection Time    05/03/13 12:30 PM      Result Value Range   Sodium 131 (*) 135 - 145 mEq/L   Potassium 3.9  3.5 - 5.1 mEq/L   Chloride 83 (*) 96 - 112 mEq/L   CO2 35 (*) 19 - 32 mEq/L   Glucose, Bld 115 (*) 70 - 99 mg/dL   BUN 30 (*) 6 - 23 mg/dL   Creatinine, Ser 5.78  0.50 - 1.35 mg/dL   Calcium 9.5  8.4 - 46.9 mg/dL   GFR calc non Af Amer 78 (*) >90 mL/min   GFR calc Af Amer 90 (*) >90 mL/min   Comment: (NOTE)     The  eGFR has been calculated using the CKD EPI equation.     This calculation has not been validated in all clinical situations.     eGFR's persistently <90 mL/min signify possible Chronic Kidney     Disease.  CBC     Status: Abnormal   Collection Time    05/03/13 12:30 PM      Result Value Range   WBC 1.5 (*) 4.0 - 10.5 K/uL   RBC 3.15 (*) 4.22 - 5.81 MIL/uL   Hemoglobin 9.2 (*) 13.0 - 17.0 g/dL   HCT 56.2 (*) 13.0 - 86.5 %   MCV 87.9  78.0 - 100.0 fL   MCH 29.2  26.0 - 34.0 pg   MCHC 33.2  30.0 - 36.0 g/dL   RDW 78.4 (*) 69.6 - 29.5 %   Platelets 307  150 - 400 K/uL  CBC WITH DIFFERENTIAL     Status: Abnormal   Collection Time    05/03/13 12:30 PM      Result Value Range   WBC 1.5 (*) 4.0 - 10.5 K/uL   RBC 3.19 (*) 4.22 - 5.81 MIL/uL   Hemoglobin 9.3 (*) 13.0 - 17.0 g/dL   HCT 28.4 (*) 13.2 - 44.0 %   MCV 87.8  78.0 - 100.0 fL   MCH 29.2  26.0 - 34.0 pg   MCHC 33.2  30.0 - 36.0 g/dL   RDW 10.2 (*) 72.5 - 36.6 %   Platelets 310  150 - 400 K/uL   Neutrophils Relative % 62  43 - 77 %   Lymphocytes Relative 25  12 - 46 %   Monocytes Relative 13 (*) 3 - 12 %   Eosinophils Relative 0  0 - 5 %   Basophils Relative 0  0 - 1 %   Neutro Abs 0.9 (*) 1.7 - 7.7 K/uL   Lymphs Abs 0.4 (*) 0.7 - 4.0 K/uL   Monocytes Absolute 0.2  0.1 - 1.0 K/uL   Eosinophils Absolute 0.0  0.0 - 0.7 K/uL   Basophils Absolute 0.0  0.0 - 0.1 K/uL   WBC Morphology MILD LEFT SHIFT (1-5% METAS, OCC MYELO, OCC BANDS)    POCT I-STAT TROPONIN I     Status: None   Collection Time    05/03/13  1:01 PM      Result Value Range   Troponin i, poc 0.00  0.00 - 0.08 ng/mL   Comment 3            Comment: Due to the release kinetics of cTnI,     a negative result within the first hours     of the onset of symptoms does not rule out     myocardial infarction with certainty.     If myocardial infarction is still suspected,     repeat the test at appropriate intervals.  URINALYSIS, ROUTINE W REFLEX MICROSCOPIC      Status: Abnormal   Collection Time    05/03/13  6:17 PM      Result Value Range   Color, Urine AMBER (*) YELLOW   Comment: BIOCHEMICALS MAY BE AFFECTED BY COLOR   APPearance CLOUDY (*) CLEAR   Specific Gravity, Urine 1.027  1.005 - 1.030   pH 5.0  5.0 - 8.0   Glucose, UA NEGATIVE  NEGATIVE mg/dL   Hgb urine dipstick NEGATIVE  NEGATIVE   Bilirubin Urine SMALL (*) NEGATIVE   Ketones, ur 40 (*) NEGATIVE mg/dL   Protein, ur NEGATIVE  NEGATIVE mg/dL  Urobilinogen, UA 0.2  0.0 - 1.0 mg/dL   Nitrite NEGATIVE  NEGATIVE   Leukocytes, UA NEGATIVE  NEGATIVE   Comment: MICROSCOPIC NOT DONE ON URINES WITH NEGATIVE PROTEIN, BLOOD, LEUKOCYTES, NITRITE, OR GLUCOSE <1000 mg/dL.  PROTIME-INR     Status: Abnormal   Collection Time    05/03/13  6:19 PM      Result Value Range   Prothrombin Time 16.7 (*) 11.6 - 15.2 seconds   INR 1.39  0.00 - 1.49  APTT     Status: None   Collection Time    05/03/13  6:19 PM      Result Value Range   aPTT 29  24 - 37 seconds  BODY FLUID CULTURE     Status: None   Collection Time    05/03/13  7:01 PM      Result Value Range   Specimen Description THORACENTESIS     Special Requests Immunocompromised     Gram Stain       Value: WBC PRESENT,BOTH PMN AND MONONUCLEAR     NO ORGANISMS SEEN     Performed by [facility]     Performed at [facility]   Culture PENDING     Report Status PENDING    PH, BODY FLUID     Status: None   Collection Time    05/03/13  7:01 PM      Result Value Range   pH, Fluid Type THORACENTESIS     pH, Fluid 8.50     Comment: Performed at [facility]  BODY FLUID CELL COUNT WITH DIFFERENTIAL     Status: Abnormal   Collection Time    05/03/13  7:01 PM      Result Value Range   Fluid Type-FCT THORACENTESIS     Color, Fluid YELLOW  YELLOW   Appearance, Fluid CLOUDY (*) CLEAR   WBC, Fluid 195  0 - 1000 cu mm   Neutrophil Count, Fluid 5  0 - 25 %   Lymphs, Fluid 23     Monocyte-Macrophage-Serous Fluid 66  50  - 90 %   Eos, Fluid 6     Other Cells, Fluid PENDING PATHOLOGY REPORT     Comment: OTHER CELLS PRESENT, UNIDENTIFIED.  LACTATE DEHYDROGENASE, BODY FLUID     Status: Abnormal   Collection Time    05/03/13  7:01 PM      Result Value Range   LD, Fluid 191 (*) 3 - 23 U/L   Comment: Performed at [facility]   Fluid Type-FLDH THORACENTESIS    PROTEIN, BODY FLUID     Status: None   Collection Time    05/03/13  7:01 PM      Result Value Range   Total protein, fluid 4.0     Comment: NO NORMAL RANGE ESTABLISHED FOR THIS TEST     Performed at [facility]   Fluid Type-FTP THORACENTESIS    GRAM STAIN     Status: None   Collection Time    05/03/13  7:02 PM      Result Value Range   Specimen Description THORACENTESIS     Special Requests Immunocompromised     Gram Stain       Value: CYTOSPIN     NO ORGANISMS SEEN     WBC PRESENT,BOTH PMN AND MONONUCLEAR     Gram Stain Report Called to,Read Back By and Verified With: S YOUNG RN [id number] 05/03/13 A NAVARRO   Report Status 05/03/2013  FINAL    GLUCOSE, PERITONEAL FLUID     Status: None   Collection Time    05/03/13  7:02 PM      Result Value Range   Glucose, Peritoneal Fluid 99     Comment: NO NORMAL RANGE ESTABLISHED FOR THIS TEST     Performed at [facility]  GLUCOSE, CAPILLARY     Status: Abnormal   Collection Time    05/03/13  8:07 PM      Result Value Range   Glucose-Capillary 182 (*) 70 - 99 mg/dL   Comment 1 Documented in Chart     Comment 2 Notify RN    GLUCOSE, CAPILLARY     Status: Abnormal   Collection Time    05/03/13 11:56 PM      Result Value Range   Glucose-Capillary 186 (*) 70 - 99 mg/dL   Comment 1 Documented in Chart     Comment 2 Notify RN    GLUCOSE, CAPILLARY     Status: Abnormal   Collection Time    05/04/13  4:11 AM      Result Value Range   Glucose-Capillary 159 (*) 70 - 99 mg/dL   Comment 1 Documented in Chart     Comment 2 Notify RN    BASIC METABOLIC PANEL     Status: Abnormal    Collection Time    05/04/13  4:55 AM      Result Value Range   Sodium 131 (*) 135 - 145 mEq/L   Potassium 3.5  3.5 - 5.1 mEq/L   Chloride 89 (*) 96 - 112 mEq/L   CO2 36 (*) 19 - 32 mEq/L   Glucose, Bld 167 (*) 70 - 99 mg/dL   BUN 36 (*) 6 - 23 mg/dL   Creatinine, Ser 1.61  0.50 - 1.35 mg/dL   Calcium 8.3 (*) 8.4 - 10.5 mg/dL   GFR calc non Af Amer 54 (*) >90 mL/min   GFR calc Af Amer 62 (*) >90 mL/min   Comment: (NOTE)     The eGFR has been calculated using the CKD EPI equation.     This calculation has not been validated in all clinical situations.     eGFR's persistently <90 mL/min signify possible Chronic Kidney     Disease.  CBC     Status: Abnormal   Collection Time    05/04/13  4:55 AM      Result Value Range   WBC 2.4 (*) 4.0 - 10.5 K/uL   RBC 2.73 (*) 4.22 - 5.81 MIL/uL   Hemoglobin 7.9 (*) 13.0 - 17.0 g/dL   HCT 09.6 (*) 04.5 - 40.9 %   MCV 88.6  78.0 - 100.0 fL   MCH 28.9  26.0 - 34.0 pg   MCHC 32.6  30.0 - 36.0 g/dL   RDW 81.1 (*) 91.4 - 78.2 %   Platelets 261  150 - 400 K/uL    Dg Chest 2 View (if Patient Has Fever And/or Copd)  05/03/2013   *RADIOLOGY REPORT*  Clinical Data: Shortness of breath, vomiting  CHEST - 2 VIEW  Comparison: 03/11/2013  Findings: There is large right pleural effusion with atelectasis or infiltrate in the right lower lobe.  Atelectasis is noted also in the right upper lobe.  There is only aeration of the upper segments of the right upper lobe.  Left lung is clear.  Right IJ Port-A-Cath with tip in SVC.  No pulmonary edema.  IMPRESSION: Large right pleural effusion  with atelectasis or infiltrate in the right midlung and right lower lobe.  Left lung is clear.  No pulmonary edema.   Original Report Authenticated By: Natasha Mead, M.D.   Dg Chest Port 1 View  05/03/2013   CLINICAL DATA:  Right pleural effusion. Status post thoracentesis.  EXAM: PORTABLE CHEST - 1 VIEW  COMPARISON:  05/03/2013  FINDINGS: A moderate right pleural effusion shows interval  decrease in size. No pneumothorax identified. Right lower lung atelectasis or consolidation again demonstrated. Left lung is clear. Heart size is stable. Right-sided power port remains in appropriate position. A new nasogastric tube is seen with tip in the proximal stomach.  IMPRESSION: Decreased size of right pleural effusion. No pneumothorax identified. Persistent right lower lung atelectasis versus consolidation.   Electronically Signed   By: Myles Rosenthal   On: 05/03/2013 19:08   Dg Abd Portable 2v  05/03/2013   *RADIOLOGY REPORT*  Clinical Data: Nausea, vomiting, bloating, question small bowel obstruction  PORTABLE ABDOMEN - 2 VIEW  Comparison: None Correlation:  CT abdomen pelvis 03/11/2013  Findings: Gaseous distention of stomach. Markedly distended small bowel loops up to 7.2 cm diameter. Decompressed distal small bowel loops in colon. Findings are consistent with a high-grade proximal to mid small bowel obstruction. No definite free intraperitoneal air. Bones diffusely demineralized. Nonobstructing right renal calculi.  IMPRESSION: Marked distention of proximal small bowel loops and gaseous distention of stomach consistent with a high-grade proximal to mid small bowel obstruction.   Original Report Authenticated By: Ulyses Southward, M.D.    Review of Systems  Constitutional: Negative for fever, chills and weight loss.  HENT: Negative.   Eyes: Negative.   Respiratory: Positive for shortness of breath.   Cardiovascular: Negative.   Gastrointestinal: Positive for nausea and vomiting. Negative for heartburn, abdominal pain, diarrhea, constipation and blood in stool.       Started having bloating last weekend  Progressed to nausea and vomiting over the weekend till he came to the ER.  Genitourinary: Negative.   Musculoskeletal: Positive for back pain (he has some back pain he says it's midline about where his belt is.  hurts more sitting and lying than standing.).  Skin: Negative.   Neurological:  Negative.   Psychiatric/Behavioral: Negative.    Blood pressure 105/65, pulse 82, temperature 97.4 F (36.3 C), temperature source Oral, resp. rate 18, height 5\' 7"  (1.702 m), weight 65.4 kg (144 lb 2.9 oz), SpO2 95.00%. Physical Exam  Constitutional: He is oriented to person, place, and time. He appears well-developed.  Thin WM no acute distress, feels better than last PM when he came in.  HENT:  Head: Normocephalic and atraumatic.  Nose: Nose normal.  NG in place  Eyes: Conjunctivae and EOM are normal. Pupils are equal, round, and reactive to light. Right eye exhibits no discharge. Left eye exhibits no discharge. No scleral icterus.  Neck: Normal range of motion. Neck supple. No JVD present. No tracheal deviation present. No thyromegaly present.  Cardiovascular: Normal rate, regular rhythm, normal heart sounds and intact distal pulses.  Exam reveals no gallop.   No murmur heard. Respiratory: Effort normal. No respiratory distress. He has no wheezes. He has no rales. He exhibits no tenderness.  BS down on the right below site of thoracentesis.  GI: Soft. He exhibits distension. He exhibits no mass. There is no tenderness. There is no rebound and no guarding.  Decreased BS, mild distension, he and daughter says it's less than last PM  Musculoskeletal: He exhibits no  edema.  Lymphadenopathy:    He has no cervical adenopathy.  Neurological: He is alert and oriented to person, place, and time. No cranial nerve deficit.  Skin: Skin is warm and dry. No rash noted. No erythema. No pallor.  Psychiatric: He has a normal mood and affect. His behavior is normal. Judgment and thought content normal.    Assessment/Plan: 1.  SBO with  pancreatic cancer Stage IIIB T3 N1M0, s/p Distal pancreectomy and spleenectomy 06/2010. 2. On going Chemotherapy 3.AODM s/p pancreatectomy 4.Hypertension  Plan:  Conservative therapy with NG decompression, hydration and bowel rest.  He will need Oncology to see. If  this is carcinomatosis palliative care may need to be considered.  Acute 3 way abdominal films pending.  Philip Shaw 05/04/2013, 7:41 AM

## 2013-05-04 NOTE — Progress Notes (Signed)
Patient had some oral meds ordered and is NPO status but has NGT, called pharmacy to verify if OK to give patient's meds through NGT. Pharmacist said that one of patient's meds (Flomax) not able to be given through NGT as the manufacturer recommends not opening up the capsule. No liquid form available. Junious Silk, NP on call, notified that flomax not being given and after reviewing the patient's chart, NP d/c'd all oral meds. No meds have been administered through patient's NGT for this shift. Will continue to monitor.

## 2013-05-04 NOTE — Progress Notes (Signed)
Inpatient Diabetes Program Recommendations  AACE/ADA: New Consensus Statement on Inpatient Glycemic Control (2013)  Target Ranges:  Prepandial:   less than 140 mg/dL      Peak postprandial:   less than 180 mg/dL (1-2 hours)      Critically ill patients:  140 - 180 mg/dL   Inpatient Diabetes Program Recommendations Correction (SSI): consider starting Novolog sensitive scale TID Thank you  Piedad Climes BSN, RN,CDE Inpatient Diabetes Coordinator [phone] (team pager)

## 2013-05-04 NOTE — Consult Note (Signed)
PULMONARY  / CRITICAL CARE MEDICINE  Name: Philip Shaw MRN: [id number] DOB: 1935/06/26    ADMISSION DATE:  05/03/2013 CONSULTATION DATE:  9/10  REFERRING MD :  Ardyth Harps PRIMARY SERVICE:  [facility]   CHIEF COMPLAINT:  Right effusion   BRIEF PATIENT DESCRIPTION:  78 year old male w/ known metastatic pancreatic cancer now on palliative chemo. Admitted on 9/9 with SBO and dyspnea. Found to have large right effusion of eval for dyspnea. PCCM asked to see for effusion.   SIGNIFICANT EVENTS / STUDIES:  Site: right effusion Date: 9/9  Pleural fluid Serum   LDH: 191 LDH:  Protein: 4 Protein:  Cholesterol: not sent  Cholesterol:  Afb:not sent    Gm stain: negative   Culture:   Fungal: not drawn   Hct: not drawn   Color: cloudy   Wbc: 195   Neutrophils: 5%   Lymph: 23%   Ph: 8.5   Rheumatoid factor:   Adenosine Deaminase: not drawn   Glucose: 99   Cytology:       Special notes:   1.8 lit off, stopped to avoid re expansion edema    LINES / TUBES:   CULTURES: Pleural fluid 9/9>>>  ANTIBIOTICS: Cefepime 9/9>>> vanc 9/9>>>  HISTORY OF PRESENT ILLNESS:   78 y.o. male with prior h/o metastatic pancreatic cancer , hypertension, DM, was brought in on 9/9 by family for persistent nausea and vomiting. After arrival to ED, he was found hypoxic, dyspneic, and his oxygen sats were in low 80's. His CXR revealed large right pleural effusion. He denies any chest pain or back pain. He is on 2 to3 lit of [place] oxygen with sats in 91%. He also reports left quadrant abdominal pain and adbominal distention. His abdominal films show mod degree of SBO. He was admitted to medical service for further evaluation and management. PCCM consulted for thoracocentesis and surgery consulted for SBO.    PAST MEDICAL HISTORY :  Past Medical History  Diagnosis Date  . Heart murmur   . Abdominal pain, periumbilical   . Constipation   . Diabetes mellitus   . Hypertension   . Sleep apnea     pt states he no  longer uses his cpap - lost weight  . pancreatic ca dx'd 04/2010    METASTATIC PANCREATIC CA WITH METS TO LIVER AND ABDOMINAL WALL  -chemo every 2 weeks - ONCOLOGIST DR. MOHAMED MOHAMED WITH [facility].  Marland Kitchen Port-a-cath in place     RIGHT UPPER CHEST - FOR CHEMO EVERY 2 WEEKS   Past Surgical History  Procedure Laterality Date  . Appendectomy  42-93 years old  . Pancreatectomy  06/2010    with splenectomy  . Inguinal hernia repair  as infant, age 72    LIH- infant, RIH age 38  . Knee arthroscopy  04/23/2012    Procedure: ARTHROSCOPY KNEE;  Surgeon: Loanne Drilling, MD;  Location: [facility];  Service: Orthopedics;  Laterality: Right;  with Debridement  . Splenectomy, total     Prior to Admission medications   Medication Sig Start Date End Date Taking? Authorizing Provider  Alum & Mag Hydroxide-Simeth (MAGIC MOUTHWASH) SOLN Take 5 mLs by mouth QID. 01/21/13  Yes Si Gaul, MD  aspirin 81 MG tablet Take 81 mg by mouth every morning.    Yes Historical Provider, MD  gabapentin (NEURONTIN) 100 MG capsule Take 100 mg by mouth 2 (two) times daily.  10/18/12  Yes Adrena E Johnson, PA-C  insulin glargine (LANTUS)  100 UNIT/ML injection Inject 24 Units into the skin at bedtime.   Yes Historical Provider, MD  lidocaine (XYLOCAINE) 2 % solution TAKE BY MOUTH FOUR TIMES DAILY 01/21/13  Yes Si Gaul, MD  lidocaine-prilocaine (EMLA) cream Apply 1 application topically as needed. 04/26/13  Yes Si Gaul, MD  mirtazapine (REMERON) 30 MG tablet TAKE 1 TABLET BY MOUTH EVERY NIGHT AT BEDTIME 04/26/13  Yes Si Gaul, MD  ondansetron (ZOFRAN) 8 MG tablet Take 1 tablet (8 mg total) by mouth every 12 (twelve) hours as needed for nausea. 04/26/13  Yes Si Gaul, MD  oxyCODONE (OXY IR/ROXICODONE) 5 MG immediate release tablet Take 1 tablet (5 mg total) by mouth every 4 (four) hours as needed. 10/13/11  Yes Si Gaul, MD  oxyCODONE (OXYCONTIN) 10 MG 12 hr tablet Take 1 tablet (10 mg  total) by mouth every 12 (twelve) hours. 02/14/13-pt states he only takes one /day 04/04/13  Yes Adrena E Johnson, PA-C  ranitidine (ZANTAC) 150 MG tablet Take 150 mg by mouth 2 (two) times daily.  05/22/11  Yes Historical Provider, MD  tamsulosin (FLOMAX) 0.4 MG CAPS Take 0.4 mg by mouth 2 (two) times daily.   Yes Historical Provider, MD  terazosin (HYTRIN) 2 MG capsule Take 2 mg by mouth at bedtime.  03/22/13  Yes Historical Provider, MD   No Known Allergies  FAMILY HISTORY:  Family History  Problem Relation Age of Onset  . Breast cancer Sister   . Throat cancer Father    SOCIAL HISTORY:  reports that he quit smoking about 41 years ago. He has never used smokeless tobacco. He reports that he does not drink alcohol or use illicit drugs.  REVIEW OF SYSTEMS bolds are positive :   Constitutional: Negative for fever, chills, weight loss, malaise/fatigue and diaphoresis.  HENT: Negative for hearing loss, ear pain, nosebleeds, congestion, sore throat, neck pain, tinnitus and ear discharge.   Eyes: Negative for blurred vision, double vision, photophobia, pain, discharge and redness.  Respiratory: Negative for cough, hemoptysis, sputum production, shortness of breath on presentation w/ sats in 80s, this has improved after thora on 9/9,  wheezing and stridor.   Cardiovascular: Negative for chest pain, palpitations, orthopnea, claudication, leg swelling and PND.  Gastrointestinal: Negative for heartburn, nausea, vomiting, abdominal pain on admission, improved after gastric decompression w/ NGT, diarrhea, constipation, blood in stool and melena.  Genitourinary: Negative for dysuria, urgency, frequency, hematuria and flank pain.  Musculoskeletal: Negative for myalgias, back pain, joint pain and falls.  Skin: Negative for itching and rash.  Neurological: Negative for dizziness, tingling, tremors, sensory change, speech change, focal weakness, seizures, loss of consciousness, weakness and headaches.   Endo/Heme/Allergies: Negative for environmental allergies and polydipsia. Does not bruise/bleed easily.  SUBJECTIVE:  Feels better  VITAL SIGNS: Temp:  [97.4 F (36.3 C)-98.8 F (37.1 C)] 97.4 F (36.3 C) (09/10 0439) Pulse Rate:  [82-111] 82 (09/10 0439) Resp:  [18-21] 18 (09/10 0439) BP: (105-148)/(65-82) 105/65 mmHg (09/10 0439) SpO2:  [87 %-97 %] 95 % (09/10 0439) FiO2 (%):  [2 %] 2 % (09/09 1132) Weight:  [65.4 kg (144 lb 2.9 oz)-69.6 kg (153 lb 7 oz)] 65.4 kg (144 lb 2.9 oz) (09/09 1841)  PHYSICAL EXAMINATION: General:  Chronically ill appearing white male, no acute distress Neuro:  No focal def  HEENT:  NGT in nare, neck veins flat, MMM  Cardiovascular:  rrr Lungs:  Decreased on right  Abdomen:  Soft, + bowel sounds  Musculoskeletal:  Intact  Skin:  Intact    Recent Labs Lab 05/03/13 1230 05/04/13 0455  NA 131* 131*  K 3.9 3.5  CL 83* 89*  CO2 35* 36*  BUN 30* 36*  CREATININE 0.99 1.26  GLUCOSE 115* 167*    Recent Labs Lab 05/03/13 1230 05/04/13 0455  HGB 9.2*  9.3* 7.9*  HCT 27.7*  28.0* 24.2*  WBC 1.5*  1.5* 2.4*  PLT 307  310 261   Dg Chest 2 View (if Patient Has Fever And/or Copd)  05/03/2013   *RADIOLOGY REPORT*  Clinical Data: Shortness of breath, vomiting  CHEST - 2 VIEW  Comparison: 03/11/2013  Findings: There is large right pleural effusion with atelectasis or infiltrate in the right lower lobe.  Atelectasis is noted also in the right upper lobe.  There is only aeration of the upper segments of the right upper lobe.  Left lung is clear.  Right IJ Port-A-Cath with tip in SVC.  No pulmonary edema.  IMPRESSION: Large right pleural effusion with atelectasis or infiltrate in the right midlung and right lower lobe.  Left lung is clear.  No pulmonary edema.   Original Report Authenticated By: Natasha Mead, M.D.   Dg Chest Port 1 View  05/03/2013   CLINICAL DATA:  Right pleural effusion. Status post thoracentesis.  EXAM: PORTABLE CHEST - 1 VIEW   COMPARISON:  05/03/2013  FINDINGS: A moderate right pleural effusion shows interval decrease in size. No pneumothorax identified. Right lower lung atelectasis or consolidation again demonstrated. Left lung is clear. Heart size is stable. Right-sided power port remains in appropriate position. A new nasogastric tube is seen with tip in the proximal stomach.  IMPRESSION: Decreased size of right pleural effusion. No pneumothorax identified. Persistent right lower lung atelectasis versus consolidation.   Electronically Signed   By: Myles Rosenthal   On: 05/03/2013 19:08   Dg Abd Portable 2v  05/03/2013   *RADIOLOGY REPORT*  Clinical Data: Nausea, vomiting, bloating, question small bowel obstruction  PORTABLE ABDOMEN - 2 VIEW  Comparison: None Correlation:  CT abdomen pelvis 03/11/2013  Findings: Gaseous distention of stomach. Markedly distended small bowel loops up to 7.2 cm diameter. Decompressed distal small bowel loops in colon. Findings are consistent with a high-grade proximal to mid small bowel obstruction. No definite free intraperitoneal air. Bones diffusely demineralized. Nonobstructing right renal calculi.  IMPRESSION: Marked distention of proximal small bowel loops and gaseous distention of stomach consistent with a high-grade proximal to mid small bowel obstruction.   Original Report Authenticated By: Ulyses Southward, M.D.  >persistant Right sided consolidation/atx, can't exclude mass  ASSESSMENT / PLAN: Metastatic pancreatic cancer (adenocarcinoma) w/ mets to liver and abd wall. He is currently on palliative Chemo with: FOLFIRI (5FU, Leucovorin and Irinotecan) every 2 weeks. First cycle was given on 12/06/2012 . S/P 8 cycles.  Ileus, r/o SBO Plan: Repeat CT abd pelvis Further recs per onc / surgery  Large exudative right pleural effusion in setting of metastatic pancreatic cancer. He is s/p large vol thoracentesis 9/9 of 1.9 liters.  Plan: CT chest r/o mass as we have drained 2 liters F/u cytology, if  positive then will need pleurex cath If cytology negative and no mass lesions on ct chest, would likely need repeat to drain remainder fluid for symptom releif If we recommend pleurex wold allow chest to fill back up for procedure success of pleurex  SBO: this is likely due to progression of his cancer.  Plan: Cont NGT decompression  CT abd/pelvis   Anemia of chronic  disease Plan Trend CBC  Hyperglycemia Plan ssi   Pulmonary and Critical Care Medicine [facility] Pager: [phone]  05/04/2013, 10:56 AM  Extensive discussion with famiy, pt, wife and daughter  I have fully examined this patient and agree with above findings.    And edited in full  Mcarthur Rossetti. Tyson Alias, MD, FACP Pgr: [phone] [facility]

## 2013-05-04 NOTE — Progress Notes (Signed)
Hypoglycemic Event  CBG: 65   Treatment: D50 IV 25 mL  Symptoms: None  Follow-up CBG: Time: 2134 CBG Result:116  Possible Reasons for Event: Other: Patient NPO and not receiving fluids  Comments/MD notified: Notified Junious Silk, NP and fluids added    Philip Shaw, Philip Shaw  Remember to initiate Hypoglycemia Order Set & complete

## 2013-05-04 NOTE — Progress Notes (Addendum)
ANTIBIOTIC CONSULT NOTE - Follow Up  Pharmacy Consult for Vancomycin and Cefepime Indication: HCAP  No Known Allergies  Patient Measurements: Height: 5\' 7"  (170.2 cm) Weight: 144 lb 2.9 oz (65.4 kg) IBW/kg (Calculated) : 66.1 Weight: 70kg on 04/26/13  Vital Signs: Temp: 97.4 F (36.3 C) (09/10 0439) Temp src: Oral (09/10 0439) BP: 105/65 mmHg (09/10 0439) Pulse Rate: 82 (09/10 0439)  Labs:  Recent Labs  05/03/13 1230 05/04/13 0455  WBC 1.5*  1.5* 2.4*  HGB 9.2*  9.3* 7.9*  PLT 307  310 261  CREATININE 0.99 1.26   Estimated Creatinine Clearance: 46.1 ml/min (by C-G formula based on Cr of 1.26). No results found for this basename: VANCOTROUGH, Leodis Binet, VANCORANDOM, GENTTROUGH, GENTPEAK, GENTRANDOM, TOBRATROUGH, TOBRAPEAK, TOBRARND, AMIKACINPEAK, AMIKACINTROU, AMIKACIN,  in the last 72 hours   Microbiology: Recent Results (from the past 720 hour(s))  TECHNOLOGIST REVIEW     Status: None   Collection Time    04/11/13  9:03 AM      Result Value Range Status   Technologist Review     Final   Value: Moderate acanthos and fragments, few targets, slight polychromasia  BODY FLUID CULTURE     Status: None   Collection Time    05/03/13  7:01 PM      Result Value Range Status   Specimen Description THORACENTESIS   Final   Special Requests Immunocompromised   Final   Gram Stain     Final   Value: WBC PRESENT,BOTH PMN AND MONONUCLEAR     NO ORGANISMS SEEN     Performed by [facility]     Performed at [facility]   Culture PENDING   Incomplete   Report Status PENDING   Incomplete  GRAM STAIN     Status: None   Collection Time    05/03/13  7:02 PM      Result Value Range Status   Specimen Description THORACENTESIS   Final   Special Requests Immunocompromised   Final   Gram Stain     Final   Value: CYTOSPIN     NO ORGANISMS SEEN     WBC PRESENT,BOTH PMN AND MONONUCLEAR     Gram Stain Report Called to,Read Back By and Verified With: S YOUNG RN [id number]  05/03/13 A NAVARRO   Report Status 05/03/2013 FINAL   Final     Medications:   Anti-infectives   Start     Dose/Rate Route Frequency Ordered Stop   05/04/13 0400  vancomycin (VANCOCIN) IVPB 750 mg/150 ml premix     750 mg 150 mL/hr over 60 Minutes Intravenous Every 12 hours 05/03/13 1332     05/03/13 2200  ceFEPIme (MAXIPIME) 2 g in dextrose 5 % 50 mL IVPB     2 g 100 mL/hr over 30 Minutes Intravenous 3 times per day 05/03/13 1332     05/03/13 1500  vancomycin (VANCOCIN) IVPB 1000 mg/200 mL premix     1,000 mg 200 mL/hr over 60 Minutes Intravenous STAT 05/03/13 1332 05/03/13 1548   05/03/13 1400  ceFEPIme (MAXIPIME) 2 g in dextrose 5 % 50 mL IVPB     2 g 100 mL/hr over 30 Minutes Intravenous STAT 05/03/13 1332 05/03/13 1442     Assessment: 78 yo M with pancreatic CA presents 9/9 with SOB, bloating, and emesis. Completed a cycle of chemotherapy on 9/4. Started empiric Vanco and cefepime for suspected HCAP on 9/9.  Based on updated wt = 65kg and CrCl~62ml/min, will reduce  cefepime to q12h. Vanco dose remains empirically appropriate.   Goal of Therapy:  Vancomycin trough level 15-20 mcg/ml  Plan:   Change cefepime to 2g IV q12h.  Measure Vanc trough at steady state.  Follow up renal fxn and culture results.  Darrol Angel, PharmD Pager: [phone] 05/04/2013 11:14 AM

## 2013-05-04 NOTE — Consult Note (Signed)
Agree with A&P of WJ,PA. I am not optimistic that surgery will add anything to improve his situation

## 2013-05-04 NOTE — Consult Note (Signed)
NAMEADONAI Shaw       DOB: 09/19/1934           DATE: 05/03/2013       YNW:[id number]  CC:  Chief Complaint  Patient presents with  . Shortness of Breath  . Bloated  . Emesis    HPI: This patient has known metastatic pancreatic cancer undergoing chemo. There is a large liver met and a CT a few weeks ago suggested involvemtn of omentum and perhaps other disease in the abdomen. He has been basically on a high protein liquid diet for a while. Sunday he developed some bloating and nausea, then vomiting. Was better for a time then recurred and presented to ED and then admitted. He has not had a BM or passed gas Since Sunday. He is not having significant abd pain. He just had an N/G placed. His nausea improved after the last time he vomited. He has significant dyspnea and is scheduled for a thoracentesis shortly.  EXAM: Vital signs: BP 105/65  Pulse 82  Temp(Src) 97.4 F (36.3 C) (Oral)  Resp 18  Ht 5\' 7"  (1.702 m)  Wt 144 lb 2.9 oz (65.4 kg)  BMI 22.58 kg/m2  SpO2 95%  General: Patient alert, pale, short of breath, uncomfortable and mildly cachectic. Abd: Slightly distended and a bit tympanitic, not tender, no obvious mass.   Data Reviewed: Looked at old records and reviewed recent CT and KUB. He has evidence of high grade SBO on the KUB. CT is worrisome for carcinomatosis IMP: SBO, likely from his metastatic disease; large pleural effusion, ? malignant  PLAN: For now the effusion and his respiratory status need management. N/G tube can buy [place] some time. Will need to review with his oncologist as it may be time to look at palliative care. If he has carcinomatosis, surgery is not likely to produce any long term benefit.  Will see him again on 9/10  [name] 05/04/2013

## 2013-05-04 NOTE — Plan of Care (Cosign Needed)
RN called re: ? substitute for Flomax since NPO and pharmacy says this med can't be crushed and there are no other therapeutic substitutions. Chart/labs/CT abdomen reviewed. Has NGT for SBO. Have dc'd all oral meds since would not likely be absorbed after administration thru NG in setting of SBO. Will add IVF (with dextrose since would like to continue usual Lantus and recent CBG 65) noting had 1400 cc plus out thru NG in past 24 hours so likely volume depleted. Would follow labs. CT also shows significant right colon constipation.  Junious Silk, ANP

## 2013-05-04 NOTE — Progress Notes (Signed)
DIAGNOSIS: Metastatic pancreatic adenocarcinoma, now with metastatic disease to the liver and the abdominal wall, initially diagnosed with stage IIIB(T3, N1, M0) pancreatic tail adenocarcinoma in November of 2011.  PRIOR THERAPY:  #1 distal pancreatectomy and splenectomy on 06/25/2010  #2 adjuvant chemotherapy with gemcitabine started 08/20/2010 and completed 2 cycles on 12/02/2010.  #3 status post concurrent chemoradiation with Xeloda from 12/15/2010 on 10 01/24/2011.  #4 status post 2 more cycles of adjuvant gemcitabine with gemcitabine completed on 04/04/2011.  #5 FOLFOX giving every 2 weeks status post 12 cycles.  #6 Continuous infusion 5-FU and leucovorin every 2 weeks. Status post 4 cycles discontinued today secondary to disease progression.  #7 The patient was started on 05/03/2012 the first cycle of systemic chemotherapy with gemcitabine 800 mg/M2 in addition to Abraxane 80 mg/M2 on weekly basis for 3 out of every 4 weeks. Status post 6 cycles, discontinued today secondary to disease progression.  CURRENT THERAPY: FOLFIRI (5FU, Leucovorin and Irinotecan) every 2 weeks. First cycle was given on 12/06/2012 . S/P 8 cycles.  CHEMOTHERAPY INTENT: Palliative  CURRENT # OF CHEMOTHERAPY CYCLES: 8  CURRENT ANTIEMETICS: Zofran, Decadron and Compazine  CURRENT SMOKING STATUS: Nonsmoker  ORAL CHEMOTHERAPY AND CONSENT: None  CURRENT BISPHOSPHONATES USE: None  PAIN MANAGEMENT: Well-tolerated with OxyContin and oxycodone  NARCOTICS INDUCED CONSTIPATION: Over-the-counter stool softner  LIVING WILL AND CODE STATUS: Full code  Subjective: The patient was admitted with intractable nausea and vomiting as well as abdominal pain and shortness breath with minimal exertion. Chest x-ray showed large right pleural effusion and abdomen x-ray showed small bowel obstruction. The patient is currently on NG tube suction. He is feeling much better than yesterday. He underwent a right sided thoracentesis by the critical  care team with drainage of 1500 cc of pleural fluid. He has no fever or chills. He has no current nausea or vomiting. He has no chest pain and continues to have mild shortness of breath.  Objective: Vital signs in last 24 hours: Temp:  [97.4 F (36.3 C)-98.8 F (37.1 C)] 97.4 F (36.3 C) (09/10 0439) Pulse Rate:  [82-111] 82 (09/10 0439) Resp:  [18-21] 18 (09/10 0439) BP: (105-148)/(65-82) 105/65 mmHg (09/10 0439) SpO2:  [87 %-97 %] 95 % (09/10 0439) FiO2 (%):  [2 %] 2 % (09/09 1132) Weight:  [144 lb 2.9 oz (65.4 kg)-153 lb 7 oz (69.6 kg)] 144 lb 2.9 oz (65.4 kg) (09/09 1841)  Intake/Output from previous day: 09/09 0701 - 09/10 0700 In: 303 [I.V.:3; IV Piggyback:300] Out: 1700 [Urine:250; Emesis/NG output:1450] Intake/Output this shift:    General appearance: alert, cooperative, fatigued and no distress Resp: diminished breath sounds RLL and dullness to percussion RLL Cardio: regular rate and rhythm, S1, S2 normal, no murmur, click, rub or gallop GI: Soft slightly tender and non-distended Extremities: extremities normal, atraumatic, no cyanosis or edema  Lab Results:   Recent Labs  05/03/13 1230 05/04/13 0455  WBC 1.5*  1.5* 2.4*  HGB 9.2*  9.3* 7.9*  HCT 27.7*  28.0* 24.2*  PLT 307  310 261   BMET  Recent Labs  05/03/13 1230 05/04/13 0455  NA 131* 131*  K 3.9 3.5  CL 83* 89*  CO2 35* 36*  GLUCOSE 115* 167*  BUN 30* 36*  CREATININE 0.99 1.26  CALCIUM 9.5 8.3*    Studies/Results: Dg Chest 2 View (if Patient Has Fever And/or Copd)  05/03/2013   *RADIOLOGY REPORT*  Clinical Data: Shortness of breath, vomiting  CHEST - 2 VIEW  Comparison: 03/11/2013  Findings: There is large right pleural effusion with atelectasis or infiltrate in the right lower lobe.  Atelectasis is noted also in the right upper lobe.  There is only aeration of the upper segments of the right upper lobe.  Left lung is clear.  Right IJ Port-A-Cath with tip in SVC.  No pulmonary edema.   IMPRESSION: Large right pleural effusion with atelectasis or infiltrate in the right midlung and right lower lobe.  Left lung is clear.  No pulmonary edema.   Original Report Authenticated By: Natasha Mead, M.D.   Dg Chest Port 1 View  05/03/2013   CLINICAL DATA:  Right pleural effusion. Status post thoracentesis.  EXAM: PORTABLE CHEST - 1 VIEW  COMPARISON:  05/03/2013  FINDINGS: A moderate right pleural effusion shows interval decrease in size. No pneumothorax identified. Right lower lung atelectasis or consolidation again demonstrated. Left lung is clear. Heart size is stable. Right-sided power port remains in appropriate position. A new nasogastric tube is seen with tip in the proximal stomach.  IMPRESSION: Decreased size of right pleural effusion. No pneumothorax identified. Persistent right lower lung atelectasis versus consolidation.   Electronically Signed   By: Myles Rosenthal   On: 05/03/2013 19:08   Dg Abd Portable 2v  05/03/2013   *RADIOLOGY REPORT*  Clinical Data: Nausea, vomiting, bloating, question small bowel obstruction  PORTABLE ABDOMEN - 2 VIEW  Comparison: None Correlation:  CT abdomen pelvis 03/11/2013  Findings: Gaseous distention of stomach. Markedly distended small bowel loops up to 7.2 cm diameter. Decompressed distal small bowel loops in colon. Findings are consistent with a high-grade proximal to mid small bowel obstruction. No definite free intraperitoneal air. Bones diffusely demineralized. Nonobstructing right renal calculi.  IMPRESSION: Marked distention of proximal small bowel loops and gaseous distention of stomach consistent with a high-grade proximal to mid small bowel obstruction.   Original Report Authenticated By: Ulyses Southward, M.D.    Medications: I have reviewed the patient's current medications.  Assessment/Plan: 1) This is a very pleasant 78 years old white male with metastatic pancreatic adenocarcinoma status post several chemotherapy regimen and currently on treatment with  FOLFIRI. I will continue to hold his chemotherapy for now. I will order CT scan of the chest, abdomen and pelvis for restaging of his disease. 2) right-sided pleural effusion, status post right thoracentesis. I will order CT scan of the chest for evaluation of his condition and to see the patient would need repeat right-sided thoracentesis. 3) small bowel obstruction could be secondary to peritoneal carcinomatosis. I will order CT scan of the abdomen and pelvis for evaluation of his condition. Continue NG tube suction. The patient is followed by surgery. 4) chemotherapy-induced anemia: I will arrange for the patient to receive 2 units of PRBCs transfusion today. 5) questionable pneumonia: continue Maxipime and vancomycin. 6) nausea and vomiting: secondary to small bowel obstruction. Improved. 7) pain management: continue current pain medication. Thank you for taking good care of Philip Shaw, I will continue to follow the patient with you and assist in his management an as-needed basis.  LOS: 1 day    Keelynn Furgerson K. 05/04/2013

## 2013-05-05 ENCOUNTER — Inpatient Hospital Stay (HOSPITAL_COMMUNITY): Payer: Medicare Other

## 2013-05-05 DIAGNOSIS — J910 Malignant pleural effusion: Secondary | ICD-10-CM

## 2013-05-05 DIAGNOSIS — K56609 Unspecified intestinal obstruction, unspecified as to partial versus complete obstruction: Secondary | ICD-10-CM

## 2013-05-05 DIAGNOSIS — D638 Anemia in other chronic diseases classified elsewhere: Secondary | ICD-10-CM

## 2013-05-05 DIAGNOSIS — C259 Malignant neoplasm of pancreas, unspecified: Secondary | ICD-10-CM

## 2013-05-05 DIAGNOSIS — E876 Hypokalemia: Secondary | ICD-10-CM

## 2013-05-05 LAB — GLUCOSE, CAPILLARY
Glucose-Capillary: 134 mg/dL — ABNORMAL HIGH (ref 70–99)
Glucose-Capillary: 118 mg/dL — ABNORMAL HIGH (ref 70–99)
Glucose-Capillary: 115 mg/dL — ABNORMAL HIGH (ref 70–99)

## 2013-05-05 LAB — PROTIME-INR
INR: 1.61 — ABNORMAL HIGH (ref 0.00–1.49)
Prothrombin Time: 18.7 seconds — ABNORMAL HIGH (ref 11.6–15.2)

## 2013-05-05 LAB — CBC
HCT: 30.9 % — ABNORMAL LOW (ref 39.0–52.0)
HCT: 30.4 % — ABNORMAL LOW (ref 39.0–52.0)
Hemoglobin: 10.4 g/dL — ABNORMAL LOW (ref 13.0–17.0)
Hemoglobin: 10.3 g/dL — ABNORMAL LOW (ref 13.0–17.0)
MCH: 29.5 pg (ref 26.0–34.0)
MCHC: 33.7 g/dL (ref 30.0–36.0)
MCHC: 33.9 g/dL (ref 30.0–36.0)
MCV: 87.1 fL (ref 78.0–100.0)
Platelets: 266 10*3/uL (ref 150–400)
RBC: 3.49 MIL/uL — ABNORMAL LOW (ref 4.22–5.81)
RBC: 3.49 MIL/uL — ABNORMAL LOW (ref 4.22–5.81)
RDW: 17.6 % — ABNORMAL HIGH (ref 11.5–15.5)
WBC: 2.1 10*3/uL — ABNORMAL LOW (ref 4.0–10.5)
WBC: 2 10*3/uL — ABNORMAL LOW (ref 4.0–10.5)

## 2013-05-05 LAB — COMPREHENSIVE METABOLIC PANEL
ALT: 12 U/L (ref 0–53)
ALT: 11 U/L (ref 0–53)
AST: 14 U/L (ref 0–37)
Albumin: 1.9 g/dL — ABNORMAL LOW (ref 3.5–5.2)
Alkaline Phosphatase: 176 U/L — ABNORMAL HIGH (ref 39–117)
Alkaline Phosphatase: 162 U/L — ABNORMAL HIGH (ref 39–117)
BUN: 29 mg/dL — ABNORMAL HIGH (ref 6–23)
BUN: 18 mg/dL (ref 6–23)
CO2: 36 mEq/L — ABNORMAL HIGH (ref 19–32)
CO2: 35 mEq/L — ABNORMAL HIGH (ref 19–32)
Calcium: 7.8 mg/dL — ABNORMAL LOW (ref 8.4–10.5)
Chloride: 91 mEq/L — ABNORMAL LOW (ref 96–112)
Chloride: 95 mEq/L — ABNORMAL LOW (ref 96–112)
Creatinine, Ser: 0.86 mg/dL (ref 0.50–1.35)
GFR calc Af Amer: 59 mL/min — ABNORMAL LOW (ref >90)
GFR calc Af Amer: >90 mL/min (ref >90)
GFR calc non Af Amer: 82 mL/min — ABNORMAL LOW (ref >90)
Glucose, Bld: 82 mg/dL (ref 70–99)
Glucose, Bld: 92 mg/dL (ref 70–99)
Potassium: 3 mEq/L — ABNORMAL LOW (ref 3.5–5.1)
Potassium: 3.3 mEq/L — ABNORMAL LOW (ref 3.5–5.1)
Sodium: 134 mEq/L — ABNORMAL LOW (ref 135–145)
Sodium: 133 mEq/L — ABNORMAL LOW (ref 135–145)
Total Bilirubin: 0.6 mg/dL (ref 0.3–1.2)
Total Bilirubin: 0.5 mg/dL (ref 0.3–1.2)
Total Protein: 5.8 g/dL — ABNORMAL LOW (ref 6.0–8.3)
Total Protein: 5.6 g/dL — ABNORMAL LOW (ref 6.0–8.3)

## 2013-05-05 LAB — TYPE AND SCREEN
Antibody Screen: NEGATIVE
Unit division: 0

## 2013-05-05 LAB — APTT: aPTT: 36 seconds (ref 24–37)

## 2013-05-05 LAB — SURGICAL PCR SCREEN: MRSA, PCR: NEGATIVE

## 2013-05-05 MED ORDER — DEXTROSE 5 % IV SOLN
1.5000 g | INTRAVENOUS | Status: AC
  Administered 2013-05-06: 1.5 g via INTRAVENOUS
  Filled 2013-05-05: qty 1.5

## 2013-05-05 MED ORDER — POTASSIUM CHLORIDE 10 MEQ/100ML IV SOLN
10.0000 meq | INTRAVENOUS | Status: AC
  Administered 2013-05-05 (×4): 10 meq via INTRAVENOUS
  Filled 2013-05-05 (×4): qty 100

## 2013-05-05 NOTE — Progress Notes (Addendum)
[facility] PROGRESS NOTE  NAKSH RADI ZOX:[id number] DOB: Nov 03, 1934 DOA: 05/03/2013 PCP: [name], MD  Assessment/Plan: SBO -Likely from progression of cancer. -Appreciate surgical input. -Continue NG decompression. -Agree that he would be a poor surgical candidate if needed for his SBO, but what other option will he have if he fails conservative measures?  Large Exudative Right Pleural Effusion -S/p large volume thoracentesis on 9/9 of 2 L. -Best option for him is pleurex catheter. -CT surgery has already been consulted by pulmonary. -Pleural fluid with malignant cells. -Do not feel that clinically or by imaging he has PNA, so will DC vanc/Cefepime.  Metastatic Pancreatic Cancer -Pleural fluid with malignant cells, CT with pleural and peritoneal nodules concerning for metastatic disease. -As per oncology.  AOCD -He is now s/p transfusion of 2 units of PRBCs with a Hb of 10.4.  Hypokalemia -From NG suction. -Replete IV.  Code Status: Full Code Family Communication: Wife and daughter at bedside updated on plan of care.  Disposition Plan: Home when medically ready.    Consultants:  Surgery  Pulmonary   Antibiotics:  None  Subjective: Feels much better today, Less SOB/nausea and decreased abdominal pain. In good spirits: multiple family members and friends at bedside.  Objective: Filed Vitals:   05/05/13 0105 05/05/13 0205 05/05/13 0530 05/05/13 1400  BP: 111/69 114/61 116/66 112/70  Pulse: 93 91 92 89  Temp: 98.1 F (36.7 C) 97.7 F (36.5 C) 98 F (36.7 C) 98.2 F (36.8 C)  TempSrc: Oral Oral Oral Oral  Resp: 18 18 18 17   Height:      Weight:      SpO2: 97% 96% 95% 98%    Intake/Output Summary (Last 24 hours) at 05/05/13 1703 Last data filed at 05/05/13 1400  Gross per 24 hour  Intake 2067.08 ml  Output   2265 ml  Net -197.92 ml   Filed Weights   05/03/13 1538 05/03/13 1841  Weight: 69.6 kg (153 lb 7 oz) 65.4 kg (144 lb 2.9 oz)     Exam:   General:  AA Ox3  Cardiovascular: RRR, no M/R/G  Respiratory: decreased BS right base  Abdomen: S/ND/+BS  Extremities: 1+edema   Neurologic:  Non-focal.  Data Reviewed: Basic Metabolic Panel:  Recent Labs Lab 05/03/13 1230 05/04/13 0455 05/05/13 0430  NA 131* 131* 134*  K 3.9 3.5 3.0*  CL 83* 89* 91*  CO2 35* 36* 36*  GLUCOSE 115* 167* 82  BUN 30* 36* 29*  CREATININE 0.99 1.26 1.32  CALCIUM 9.5 8.3* 8.0*   Liver Function Tests:  Recent Labs Lab 05/05/13 0430  AST 17  ALT 12  ALKPHOS 176*  BILITOT 0.6  PROT 5.8*  ALBUMIN 2.0*   No results found for this basename: LIPASE, AMYLASE,  in the last 168 hours No results found for this basename: AMMONIA,  in the last 168 hours CBC:  Recent Labs Lab 05/03/13 1230 05/04/13 0455 05/05/13 0430  WBC 1.5*  1.5* 2.4* 2.1*  NEUTROABS 0.9*  --   --   HGB 9.2*  9.3* 7.9* 10.4*  HCT 27.7*  28.0* 24.2* 30.9*  MCV 87.9  87.8 88.6 88.5  PLT 307  310 261 245   Cardiac Enzymes: No results found for this basename: CKTOTAL, CKMB, CKMBINDEX, TROPONINI,  in the last 168 hours BNP (last 3 results) No results found for this basename: PROBNP,  in the last 8760 hours CBG:  Recent Labs Lab 05/04/13 2134 05/05/13 0016 05/05/13 0528 05/05/13 0802 05/05/13 1125  GLUCAP 116* 115* 134* 118* 115*    Recent Results (from the past 240 hour(s))  BODY FLUID CULTURE     Status: None   Collection Time    05/03/13  7:01 PM      Result Value Range Status   Specimen Description THORACENTESIS   Final   Special Requests Immunocompromised   Final   Gram Stain     Final   Value: WBC PRESENT,BOTH PMN AND MONONUCLEAR     NO ORGANISMS SEEN     Performed by [facility]     Performed at [facility]   Culture     Final   Value: NO GROWTH 1 DAY     Performed at [facility]   Report Status PENDING   Incomplete  GRAM STAIN     Status: None   Collection Time    05/03/13  7:02 PM       Result Value Range Status   Specimen Description THORACENTESIS   Final   Special Requests Immunocompromised   Final   Gram Stain     Final   Value: CYTOSPIN     NO ORGANISMS SEEN     WBC PRESENT,BOTH PMN AND MONONUCLEAR     Gram Stain Report Called to,Read Back By and Verified With: S YOUNG RN 2048 05/03/13 A NAVARRO   Report Status 05/03/2013 FINAL   Final     Studies: Ct Chest W Contrast  05/04/2013   CLINICAL DATA:  Pancreatic carcinoma diagnosed 2011. Chemotherapy and radiation therapy completed. Small bowel obstruction.  EXAM: CT CHEST, ABDOMEN, AND PELVIS WITH CONTRAST  TECHNIQUE: Multidetector CT imaging of the chest, abdomen and pelvis was performed following the standard protocol during bolus administration of intravenous contrast.  CONTRAST:  OMNIPAQUE IOHEXOL 300 MG/ML  SOLN  COMPARISON:  CT 03/11/2013, plain films 05/03/2013  FINDINGS: CT CHEST FINDINGS  Port in the right anterior chest wall. No axillary or supraclavicular lymphadenopathy. No mediastinal or hilar lymphadenopathy. No pericardial fluid. There is a moderate-sized right pleural effusion which is increased in size compared to prior. There is passive atelectasis of the right middle lobe and right lower lobe. No obstructing lesion is identified. The lung parenchyma demonstrates mild ground-glass opacities in the right lobe adjacent to the effusion which is likely atelectasis. There is some peripheral ground-glass nodules in the left upper lobe which are new from prior (image 13). It likely infectious or inflammatory.  In the medial aspect of the right upper lobe there is nodular pleural thickening to 7 mm which is increased from prior. Additional 5 mm nodule in medial aspect of the right middle lobe is increased from prior.  CT ABDOMEN AND PELVIS FINDINGS  Again demonstrated a low-density lesion lateral aspect of the left hepatic lobe measuring 3.3 x 4.3 cm unchanged from prior. No new hepatic lesions. In pneumobilia in the  nondependent left hepatic lobe. There is a NG tube extending into the gastric antrum. Patient status post Whipple procedure. No new periportal adenopathy. There is a nodule along the ventral peritoneal surface inferior to the greater curvature of stomach measuring 7 mm (image 66) which is unchanged from prior. Within the right lower abdomen there is a 12 mm nodule within the mesenteries (image 96) which is similar to 10 mm on prior but slightly increased. Small nodule within the central mesentery measuring 8 mm (image 79) increased from 5 mm on prior  The stomach is normal. Dilated loops of small bowel up to  3.5 cm in the left abdomen. The distal small bowel is reduced caliber. Contrast does flow into the distal small bowel albeit a small amount. Contrast does not reach the terminal ileum. There is a caliber change between the proximal small bowel and distal small bowel. No clear transition point is identified but most likely in the mid central abdomen. No evidence of intraperitoneal free air or pneumatosis. No portal venous gas.  The bladder is distended. Prostate gland is normal. No pelvic lymphadenopathy.  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  Interval increase in pleural fluid and atelectasis in the right hemi thorax.  New ground-glass regions in the left upper lobe suggests inflammatory infectious process.  CT ABDOMEN AND PELVIS IMPRESSION  Partial mechanical small bowel obstruction with a transition point likely in the mid central abdomen. As there are several peritoneal metastases, this could be the source of the partial obstruction.  Stable hepatic metastasis in the left hepatic lobe.  No evidence of local pancreatic cancer recurrence.  Mild increase in size of several peritoneal metastasis.   Electronically Signed   By: Genevive Bi M.D.   On: 05/04/2013 17:47   Ct  Abdomen Pelvis W Contrast  05/04/2013   CLINICAL DATA:  Pancreatic carcinoma diagnosed 2011. Chemotherapy and radiation therapy completed. Small bowel obstruction.  EXAM: CT CHEST, ABDOMEN, AND PELVIS WITH CONTRAST  TECHNIQUE: Multidetector CT imaging of the chest, abdomen and pelvis was performed following the standard protocol during bolus administration of intravenous contrast.  CONTRAST:  OMNIPAQUE IOHEXOL 300 MG/ML  SOLN  COMPARISON:  CT 03/11/2013, plain films 05/03/2013  FINDINGS: CT CHEST FINDINGS  Port in the right anterior chest wall. No axillary or supraclavicular lymphadenopathy. No mediastinal or hilar lymphadenopathy. No pericardial fluid. There is a moderate-sized right pleural effusion which is increased in size compared to prior. There is passive atelectasis of the right middle lobe and right lower lobe. No obstructing lesion is identified. The lung parenchyma demonstrates mild ground-glass opacities in the right lobe adjacent to the effusion which is likely atelectasis. There is some peripheral ground-glass nodules in the left upper lobe which are new from prior (image 13). It likely infectious or inflammatory.  In the medial aspect of the right upper lobe there is nodular pleural thickening to 7 mm which is increased from prior. Additional 5 mm nodule in medial aspect of the right middle lobe is increased from prior.  CT ABDOMEN AND PELVIS FINDINGS  Again demonstrated a low-density lesion lateral aspect of the left hepatic lobe measuring 3.3 x 4.3 cm unchanged from prior. No new hepatic lesions. In pneumobilia in the nondependent left hepatic lobe. There is a NG tube extending into the gastric antrum. Patient status post Whipple procedure. No new periportal adenopathy. There is a nodule along the ventral peritoneal surface inferior to the greater curvature of stomach measuring 7 mm (image 66) which is unchanged from prior. Within the right lower abdomen there is a 12 mm nodule within the  mesenteries (image 96) which is similar to 10 mm on prior but slightly increased. Small nodule within the central mesentery measuring 8 mm (image 79) increased from 5 mm on prior  The stomach is normal. Dilated loops of small bowel up to 3.5 cm in the left abdomen. The distal small bowel is reduced caliber. Contrast does flow into the distal small bowel albeit a small  amount. Contrast does not reach the terminal ileum. There is a caliber change between the proximal small bowel and distal small bowel. No clear transition point is identified but most likely in the mid central abdomen. No evidence of intraperitoneal free air or pneumatosis. No portal venous gas.  The bladder is distended. Prostate gland is normal. No pelvic lymphadenopathy.  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  Interval increase in pleural fluid and atelectasis in the right hemi thorax.  New ground-glass regions in the left upper lobe suggests inflammatory infectious process.  CT ABDOMEN AND PELVIS IMPRESSION  Partial mechanical small bowel obstruction with a transition point likely in the mid central abdomen. As there are several peritoneal metastases, this could be the source of the partial obstruction.  Stable hepatic metastasis in the left hepatic lobe.  No evidence of local pancreatic cancer recurrence.  Mild increase in size of several peritoneal metastasis.   Electronically Signed   By: Genevive Bi M.D.   On: 05/04/2013 17:47   Dg Chest Port 1 View  05/05/2013   CLINICAL DATA:  Assess pleural effusion, history hypertension, diabetes, pancreatic cancer  EXAM: PORTABLE CHEST - 1 VIEW  COMPARISON:  Portable exam 0510 hr compared to 05/04/2013  FINDINGS: Right jugular Port-A-Cath stable tip projecting over SVC.  Nasogastric tube extends into stomach.  Minimal enlargement of cardiac silhouette.   Mediastinal contours normal.  Large right pleural effusion with significant atelectasis of the right lung.  Underlying infiltrate and mass not excluded with this appearance.  Minimal pulmonary vascular congestion with left lung clear.  No pneumothorax.  IMPRESSION: Persistent large right pleural effusion with significant compressive atelectasis of the right lung.   Electronically Signed   By: Ulyses Southward M.D.   On: 05/05/2013 08:02   Dg Chest Port 1 View  05/04/2013   ADDENDUM REPORT: 05/04/2013 16:37  ADDENDUM: Upon further review and consultation with the ordering physician, no endotracheal tube is present on the study. Initial dictation by Dr. Reche Dixon and addendum by Dr. Amil Amen   Electronically Signed   By: Genevive Bi M.D.   On: 05/04/2013 16:37   05/04/2013   CLINICAL DATA:  Evaluate pleural effusion. Shortness of Breath. Weakness.  EXAM: PORTABLE CHEST - 1 VIEW  COMPARISON:  None 914  FINDINGS: Endotracheal tube terminates 4.8 cm above Carina. Nasogastric tube extends beyond the inferior aspect of the film.  Right internal jugular line terminates at mid SVC.  The chin overlies the apices. Cardiomegaly, exaggerated by AP portable technique. Right sided pleural effusion is moderate and not significantly changed. The left pleural effusion. No significant change in airspace disease within the right mid and lower lung. Left lung remains clear.  IMPRESSION: No significant change in moderate right-sided pleural effusion and adjacent airspace opacity.  Interval placement of endotracheal tube, terminating 4.8 cm above Carina.  Electronically Signed: By: Jeronimo Greaves On: 05/04/2013 14:32   Dg Chest Port 1 View  05/03/2013   CLINICAL DATA:  Right pleural effusion. Status post thoracentesis.  EXAM: PORTABLE CHEST - 1 VIEW  COMPARISON:  05/03/2013  FINDINGS: A moderate right pleural effusion shows interval decrease in size. No pneumothorax identified. Right lower lung atelectasis or consolidation again  demonstrated. Left lung is clear. Heart size is stable. Right-sided power port remains in appropriate position. A new nasogastric tube is seen with tip in the proximal stomach.  IMPRESSION: Decreased size of right pleural effusion. No pneumothorax identified. Persistent right lower lung atelectasis versus consolidation.   Electronically Signed   By: Myles Rosenthal   On: 05/03/2013 19:08   Dg Abd Portable 1v  05/05/2013   CLINICAL DATA:  Small bowel obstruction  EXAM: PORTABLE ABDOMEN - 1 VIEW  COMPARISON:  05/03/2013  Correlation: CT abdomen and pelvis 05/04/2013  FINDINGS: Nasogastric tube in stomach.  Oral contrast administered from prior CT is identified within the distal small bowel and colon.  However persistent dilatation of proximal small bowel loops in left abdomen compatible with persistent small bowel obstruction.  Excreted contrast within urinary bladder and renal collecting systems, with right renal collecting system slightly prominent as noted on CT.  No definite bowel wall thickening.  IMPRESSION: Persistent small bowel obstruction.   Electronically Signed   By: Ulyses Southward M.D.   On: 05/05/2013 08:07    Scheduled Meds: . ceFEPime (MAXIPIME) IV  2 g Intravenous Q12H  . enoxaparin (LOVENOX) injection  40 mg Subcutaneous Q24H  . famotidine (PEPCID) IV  20 mg Intravenous Q12H  . insulin glargine  24 Units Subcutaneous QHS  . sodium chloride  3 mL Intravenous Q12H  . vancomycin  750 mg Intravenous Q12H   Continuous Infusions: . dextrose 5 % and 0.9% NaCl 100 mL/hr at 05/04/13 2151    Active Problems:   Malnutrition of moderate degree   Malignant pleural effusion    Time spent: 35 minutes.    Chaya Jan  [facility] Pager [phone]  If 7PM-7AM, please contact night-coverage at [email or web address], password [facility] 05/05/2013, 5:03 PM  LOS: 2 days

## 2013-05-05 NOTE — Progress Notes (Signed)
Subjective: He feels better, he thinks distension is less.   NG not working. He reports some flatus started last PM Objective: Vital signs in last 24 hours: Temp:  [97.3 F (36.3 C)-98.5 F (36.9 C)] 98 F (36.7 C) (09/11 0530) Pulse Rate:  [73-93] 92 (09/11 0530) Resp:  [16-20] 18 (09/11 0530) BP: (102-125)/(54-95) 116/66 mmHg (09/11 0530) SpO2:  [94 %-99 %] 95 % (09/11 0530) Last BM Date: 05/01/13 200 from NG recorded. Afebrile, VSS K+ is 3.0, it needs to be up around 4.0 WBC is still very low, but stable KUB today show he still has SBO but he has contrast in right colon. Intake/Output from previous day: 09/10 0701 - 09/11 0700 In: 1454.6 [I.V.:698.3; Blood:706.3; IV Piggyback:50] Out: 2565 [Urine:2365; Emesis/NG output:200] Intake/Output this shift:    General appearance: alert, cooperative and no distress Resp: clear to auscultation bilaterally GI: mild distension, BS hypoactive, he is not tender.  Reports flatus started last PM  Lab Results:   Recent Labs  05/04/13 0455 05/05/13 0430  WBC 2.4* 2.1*  HGB 7.9* 10.4*  HCT 24.2* 30.9*  PLT 261 245    BMET  Recent Labs  05/04/13 0455 05/05/13 0430  NA 131* 134*  K 3.5 3.0*  CL 89* 91*  CO2 36* 36*  GLUCOSE 167* 82  BUN 36* 29*  CREATININE 1.26 1.32  CALCIUM 8.3* 8.0*   PT/INR  Recent Labs  05/03/13 1819  LABPROT 16.7*  INR 1.39     Recent Labs Lab 05/05/13 0430  AST 17  ALT 12  ALKPHOS 176*  BILITOT 0.6  PROT 5.8*  ALBUMIN 2.0*     Lipase  No results found for this basename: lipase     Studies/Results: Dg Chest 2 View (if Patient Has Fever And/or Copd)  05/03/2013   *RADIOLOGY REPORT*  Clinical Data: Shortness of breath, vomiting  CHEST - 2 VIEW  Comparison: 03/11/2013  Findings: There is large right pleural effusion with atelectasis or infiltrate in the right lower lobe.  Atelectasis is noted also in the right upper lobe.  There is only aeration of the upper segments of the  right upper lobe.  Left lung is clear.  Right IJ Port-A-Cath with tip in SVC.  No pulmonary edema.  IMPRESSION: Large right pleural effusion with atelectasis or infiltrate in the right midlung and right lower lobe.  Left lung is clear.  No pulmonary edema.   Original Report Authenticated By: Natasha Mead, M.D.   Ct Chest W Contrast  05/04/2013   CLINICAL DATA:  Pancreatic carcinoma diagnosed 2011. Chemotherapy and radiation therapy completed. Small bowel obstruction.  EXAM: CT CHEST, ABDOMEN, AND PELVIS WITH CONTRAST  TECHNIQUE: Multidetector CT imaging of the chest, abdomen and pelvis was performed following the standard protocol during bolus administration of intravenous contrast.  CONTRAST:  OMNIPAQUE IOHEXOL 300 MG/ML  SOLN  COMPARISON:  CT 03/11/2013, plain films 05/03/2013  FINDINGS: CT CHEST FINDINGS  Port in the right anterior chest wall. No axillary or supraclavicular lymphadenopathy. No mediastinal or hilar lymphadenopathy. No pericardial fluid. There is a moderate-sized right pleural effusion which is increased in size compared to prior. There is passive atelectasis of the right middle lobe and right lower lobe. No obstructing lesion is identified. The lung parenchyma demonstrates mild ground-glass opacities in the right lobe adjacent to the effusion which is likely atelectasis. There is some peripheral ground-glass nodules in the left upper lobe which are new from prior (image 13). It likely infectious or inflammatory.  In the medial aspect of the right upper lobe there is nodular pleural thickening to 7 mm which is increased from prior. Additional 5 mm nodule in medial aspect of the right middle lobe is increased from prior.  CT ABDOMEN AND PELVIS FINDINGS  Again demonstrated a low-density lesion lateral aspect of the left hepatic lobe measuring 3.3 x 4.3 cm unchanged from prior. No new hepatic lesions. In pneumobilia in the nondependent left hepatic lobe. There is a NG tube extending into the  gastric antrum. Patient status post Whipple procedure. No new periportal adenopathy. There is a nodule along the ventral peritoneal surface inferior to the greater curvature of stomach measuring 7 mm (image 66) which is unchanged from prior. Within the right lower abdomen there is a 12 mm nodule within the mesenteries (image 96) which is similar to 10 mm on prior but slightly increased. Small nodule within the central mesentery measuring 8 mm (image 79) increased from 5 mm on prior  The stomach is normal. Dilated loops of small bowel up to 3.5 cm in the left abdomen. The distal small bowel is reduced caliber. Contrast does flow into the distal small bowel albeit a small amount. Contrast does not reach the terminal ileum. There is a caliber change between the proximal small bowel and distal small bowel. No clear transition point is identified but most likely in the mid central abdomen. No evidence of intraperitoneal free air or pneumatosis. No portal venous gas.  The bladder is distended. Prostate gland is normal. No pelvic lymphadenopathy.  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  Interval increase in pleural fluid and atelectasis in the right hemi thorax.  New ground-glass regions in the left upper lobe suggests inflammatory infectious process.  CT ABDOMEN AND PELVIS IMPRESSION  Partial mechanical small bowel obstruction with a transition point likely in the mid central abdomen. As there are several peritoneal metastases, this could be the source of the partial obstruction.  Stable hepatic metastasis in the left hepatic lobe.  No evidence of local pancreatic cancer recurrence.  Mild increase in size of several peritoneal metastasis.   Electronically Signed   By: Genevive Bi M.D.   On: 05/04/2013 17:47   Ct Abdomen Pelvis W Contrast  05/04/2013   CLINICAL DATA:  Pancreatic  carcinoma diagnosed 2011. Chemotherapy and radiation therapy completed. Small bowel obstruction.  EXAM: CT CHEST, ABDOMEN, AND PELVIS WITH CONTRAST  TECHNIQUE: Multidetector CT imaging of the chest, abdomen and pelvis was performed following the standard protocol during bolus administration of intravenous contrast.  CONTRAST:  OMNIPAQUE IOHEXOL 300 MG/ML  SOLN  COMPARISON:  CT 03/11/2013, plain films 05/03/2013  FINDINGS: CT CHEST FINDINGS  Port in the right anterior chest wall. No axillary or supraclavicular lymphadenopathy. No mediastinal or hilar lymphadenopathy. No pericardial fluid. There is a moderate-sized right pleural effusion which is increased in size compared to prior. There is passive atelectasis of the right middle lobe and right lower lobe. No obstructing lesion is identified. The lung parenchyma demonstrates mild ground-glass opacities in the right lobe adjacent to the effusion which is likely atelectasis. There is some peripheral ground-glass nodules in the left upper lobe which are new from prior (image 13). It likely infectious or inflammatory.  In the medial aspect of the right upper lobe there is nodular pleural thickening to 7 mm which is increased from prior. Additional 5  mm nodule in medial aspect of the right middle lobe is increased from prior.  CT ABDOMEN AND PELVIS FINDINGS  Again demonstrated a low-density lesion lateral aspect of the left hepatic lobe measuring 3.3 x 4.3 cm unchanged from prior. No new hepatic lesions. In pneumobilia in the nondependent left hepatic lobe. There is a NG tube extending into the gastric antrum. Patient status post Whipple procedure. No new periportal adenopathy. There is a nodule along the ventral peritoneal surface inferior to the greater curvature of stomach measuring 7 mm (image 66) which is unchanged from prior. Within the right lower abdomen there is a 12 mm nodule within the mesenteries (image 96) which is similar to 10 mm on prior but slightly  increased. Small nodule within the central mesentery measuring 8 mm (image 79) increased from 5 mm on prior  The stomach is normal. Dilated loops of small bowel up to 3.5 cm in the left abdomen. The distal small bowel is reduced caliber. Contrast does flow into the distal small bowel albeit a small amount. Contrast does not reach the terminal ileum. There is a caliber change between the proximal small bowel and distal small bowel. No clear transition point is identified but most likely in the mid central abdomen. No evidence of intraperitoneal free air or pneumatosis. No portal venous gas.  The bladder is distended. Prostate gland is normal. No pelvic lymphadenopathy.  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  Interval increase in pleural fluid and atelectasis in the right hemi thorax.  New ground-glass regions in the left upper lobe suggests inflammatory infectious process.  CT ABDOMEN AND PELVIS IMPRESSION  Partial mechanical small bowel obstruction with a transition point likely in the mid central abdomen. As there are several peritoneal metastases, this could be the source of the partial obstruction.  Stable hepatic metastasis in the left hepatic lobe.  No evidence of local pancreatic cancer recurrence.  Mild increase in size of several peritoneal metastasis.   Electronically Signed   By: Genevive Bi M.D.   On: 05/04/2013 17:47   Dg Chest Port 1 View  05/05/2013   CLINICAL DATA:  Assess pleural effusion, history hypertension, diabetes, pancreatic cancer  EXAM: PORTABLE CHEST - 1 VIEW  COMPARISON:  Portable exam 0510 hr compared to 05/04/2013  FINDINGS: Right jugular Port-A-Cath stable tip projecting over SVC.  Nasogastric tube extends into stomach.  Minimal enlargement of cardiac silhouette.  Mediastinal contours normal.  Large right pleural effusion with significant  atelectasis of the right lung.  Underlying infiltrate and mass not excluded with this appearance.  Minimal pulmonary vascular congestion with left lung clear.  No pneumothorax.  IMPRESSION: Persistent large right pleural effusion with significant compressive atelectasis of the right lung.   Electronically Signed   By: Ulyses Southward M.D.   On: 05/05/2013 08:02   Dg Chest Port 1 View  05/04/2013   ADDENDUM REPORT: 05/04/2013 16:37  ADDENDUM: Upon further review and consultation with the ordering physician, no endotracheal tube is present on the study. Initial dictation by Dr. Reche Dixon and addendum by Dr. Amil Amen   Electronically Signed   By: Genevive Bi M.D.   On: 05/04/2013 16:37   05/04/2013   CLINICAL DATA:  Evaluate pleural effusion. Shortness of Breath. Weakness.  EXAM: PORTABLE CHEST - 1 VIEW  COMPARISON:  None 914  FINDINGS: Endotracheal tube terminates 4.8 cm above Carina.  Nasogastric tube extends beyond the inferior aspect of the film.  Right internal jugular line terminates at mid SVC.  The chin overlies the apices. Cardiomegaly, exaggerated by AP portable technique. Right sided pleural effusion is moderate and not significantly changed. The left pleural effusion. No significant change in airspace disease within the right mid and lower lung. Left lung remains clear.  IMPRESSION: No significant change in moderate right-sided pleural effusion and adjacent airspace opacity.  Interval placement of endotracheal tube, terminating 4.8 cm above Carina.  Electronically Signed: By: Jeronimo Greaves On: 05/04/2013 14:32   Dg Chest Port 1 View  05/03/2013   CLINICAL DATA:  Right pleural effusion. Status post thoracentesis.  EXAM: PORTABLE CHEST - 1 VIEW  COMPARISON:  05/03/2013  FINDINGS: A moderate right pleural effusion shows interval decrease in size. No pneumothorax identified. Right lower lung atelectasis or consolidation again demonstrated. Left lung is clear. Heart size is stable. Right-sided power port  remains in appropriate position. A new nasogastric tube is seen with tip in the proximal stomach.  IMPRESSION: Decreased size of right pleural effusion. No pneumothorax identified. Persistent right lower lung atelectasis versus consolidation.   Electronically Signed   By: Myles Rosenthal   On: 05/03/2013 19:08   Dg Abd Portable 1v  05/05/2013   CLINICAL DATA:  Small bowel obstruction  EXAM: PORTABLE ABDOMEN - 1 VIEW  COMPARISON:  05/03/2013  Correlation: CT abdomen and pelvis 05/04/2013  FINDINGS: Nasogastric tube in stomach.  Oral contrast administered from prior CT is identified within the distal small bowel and colon.  However persistent dilatation of proximal small bowel loops in left abdomen compatible with persistent small bowel obstruction.  Excreted contrast within urinary bladder and renal collecting systems, with right renal collecting system slightly prominent as noted on CT.  No definite bowel wall thickening.  IMPRESSION: Persistent small bowel obstruction.   Electronically Signed   By: Ulyses Southward M.D.   On: 05/05/2013 08:07   Dg Abd Portable 2v  05/03/2013   *RADIOLOGY REPORT*  Clinical Data: Nausea, vomiting, bloating, question small bowel obstruction  PORTABLE ABDOMEN - 2 VIEW  Comparison: None Correlation:  CT abdomen pelvis 03/11/2013  Findings: Gaseous distention of stomach. Markedly distended small bowel loops up to 7.2 cm diameter. Decompressed distal small bowel loops in colon. Findings are consistent with a high-grade proximal to mid small bowel obstruction. No definite free intraperitoneal air. Bones diffusely demineralized. Nonobstructing right renal calculi.  IMPRESSION: Marked distention of proximal small bowel loops and gaseous distention of stomach consistent with a high-grade proximal to mid small bowel obstruction.   Original Report Authenticated By: Ulyses Southward, M.D.    Medications: . ceFEPime (MAXIPIME) IV  2 g Intravenous Q12H  . enoxaparin (LOVENOX) injection  40 mg  Subcutaneous Q24H  . famotidine (PEPCID) IV  20 mg Intravenous Q12H  . insulin glargine  24 Units Subcutaneous QHS  . sodium chloride  3 mL Intravenous Q12H  . vancomycin  750 mg Intravenous Q12H    Assessment/Plan 1. SBO with pancreatic cancer Stage IIIB T3 N1M0, s/p Distal pancreectomy and spleenectomy 06/2010.  2. On going Chemotherapy  3.AODM s/p pancreatectomy  4.Hypertension  5. Hypokalemia  PLan:  Mobilize, few ice chips and continue suction/bowel rest.  Hope it clears. Get his K+ up to 4.0, I will defer to Dr. Ardyth Harps for this.      LOS: 2 days    Philip Shaw 05/05/2013

## 2013-05-05 NOTE — Progress Notes (Signed)
Patient has malignant cells in pleural fluid. His abd films show passage of contrast into colon, so obstruction may be partial. I do not believe he will gain much from ex lap and a g-tube might be better palliation. Have we considered a palliative care consult?

## 2013-05-05 NOTE — Progress Notes (Signed)
Patient ID: Philip Shaw, male   DOB: [birth year], 78 y.o.   MRN: [id number]      [address]       [place] [address]             [phone]        Philip Shaw [facility] Medical Record #[id number] Date of Birth: 06-10-1935  Referring: No ref. provider found Primary Care: [name], MD  Chief Complaint:    Chief Complaint  Patient presents with  . Shortness of Breath  . Bloated  . Emesis    History of Present Illness:     Patient with 3 year history of pancreatic  cancer sp distal pancreatic resection, now with large rt plural effusion and partial SBO. Effusion rapidly re accumulated after thoracentesis 4 days ago   Current Activity/ Functional Status: Patient was independent with mobility/ambulation, transfers, ADL's, IADL's.   Zubrod Score: At the time of surgery this patient's most appropriate activity status/level should be described as: []  Normal activity, no symptoms [x]  Symptoms, fully ambulatory []  Symptoms, in bed less than or equal to 50% of the time []  Symptoms, in bed greater than 50% of the time but less than 100% []  Bedridden []  Moribund  Past Medical History  Diagnosis Date  . Heart murmur   . Abdominal pain, periumbilical   . Constipation   . Diabetes mellitus   . Hypertension   . Sleep apnea     pt states he no longer uses his cpap - lost weight  . pancreatic ca dx'd 04/2010    METASTATIC PANCREATIC CA WITH METS TO LIVER AND ABDOMINAL WALL  -chemo every 2 weeks - ONCOLOGIST DR. MOHAMED MOHAMED WITH [facility].  Marland Kitchen Port-a-cath in place     RIGHT UPPER CHEST - FOR CHEMO EVERY 2 WEEKS    Past Surgical History  Procedure Laterality Date  . Appendectomy  27-56 years old  . Pancreatectomy  06/2010    with splenectomy  . Inguinal hernia repair  as infant, age 47    LIH- infant, RIH age 39  . Knee arthroscopy  04/23/2012    Procedure: ARTHROSCOPY KNEE;  Surgeon: Loanne Drilling, MD;  Location: [facility];   Service: Orthopedics;  Laterality: Right;  with Debridement  . Splenectomy, total      History  Smoking status  . Former Smoker -- 1.00 packs/day for 12 years  . Quit date: 06/13/1971  Smokeless tobacco  . Never Used    History  Alcohol Use No    History   Social History  . Marital Status: Married    Spouse Name: N/A    Number of Children: N/A  . Years of Education: N/A   Occupational History  . Not on file.   Social History Main Topics  . Smoking status: Former Smoker -- 1.00 packs/day for 12 years    Quit date: 06/13/1971  . Smokeless tobacco: Never Used  . Alcohol Use: No  . Drug Use: No  . Sexual Activity: Not Currently   Other Topics Concern  . Not on file   Social History Narrative  . No narrative on file    No Known Allergies  Current Facility-Administered Medications  Medication Dose Route Frequency Provider Last Rate Last Dose  . cefUROXime (ZINACEF) 1.5 g in dextrose 5 % 50 mL IVPB  1.5 g Intravenous 60 min Pre-Op Delight Ovens, MD      . dextrose 5 %-0.9 % sodium  chloride infusion   Intravenous Continuous Russella Dar, NP 100 mL/hr at 05/04/13 2151    . enoxaparin (LOVENOX) injection 40 mg  40 mg Subcutaneous Q24H Kathlen Mody, MD   40 mg at 05/04/13 2104  . famotidine (PEPCID) IVPB 20 mg  20 mg Intravenous Q12H Kathlen Mody, MD   20 mg at 05/05/13 1213  . heparin lock flush 100 unit/mL  500 Units Intracatheter Daily PRN Si Gaul, MD      . heparin lock flush 100 unit/mL  250 Units Intracatheter PRN Si Gaul, MD      . HYDROmorphone (DILAUDID) injection 1 mg  1 mg Intravenous Q4H PRN Kathlen Mody, MD   1 mg at 05/05/13 1213  . insulin glargine (LANTUS) injection 24 Units  24 Units Subcutaneous QHS Russella Dar, NP   24 Units at 05/04/13 2200  . lidocaine (XYLOCAINE) 2 % viscous mouth solution 15 mL  15 mL Mouth/Throat Q6H PRN Kathlen Mody, MD      . lidocaine-prilocaine (EMLA) cream 1 application  1 application Topical PRN  Kathlen Mody, MD      . potassium chloride 10 mEq in 100 mL IVPB  10 mEq Intravenous Q1 Hr x 4 Estela Isaiah Blakes, MD   10 mEq at 05/05/13 1746  . sodium chloride 0.9 % injection 10 mL  10 mL Intracatheter PRN Si Gaul, MD      . sodium chloride 0.9 % injection 10-40 mL  10-40 mL Intracatheter PRN Kathlen Mody, MD   10 mL at 05/04/13 0455  . sodium chloride 0.9 % injection 3 mL  3 mL Intravenous Q12H Kathlen Mody, MD   3 mL at 05/04/13 0952  . sodium chloride 0.9 % injection 3 mL  3 mL Intracatheter PRN Si Gaul, MD       Facility-Administered Medications Ordered in Other Encounters  Medication Dose Route Frequency Provider Last Rate Last Dose  . sodium chloride 0.9 % injection 10 mL  10 mL Intracatheter PRN Si Gaul, MD   10 mL at 07/26/12 1715  . sodium chloride 0.9 % injection 10 mL  10 mL Intracatheter PRN Conni Slipper, PA-C   10 mL at 09/20/12 1736    Prescriptions prior to admission  Medication Sig Dispense Refill  . Alum & Mag Hydroxide-Simeth (MAGIC MOUTHWASH) SOLN Take 5 mLs by mouth QID.  240 mL  0  . aspirin 81 MG tablet Take 81 mg by mouth every morning.       . gabapentin (NEURONTIN) 100 MG capsule Take 100 mg by mouth 2 (two) times daily.       . insulin glargine (LANTUS) 100 UNIT/ML injection Inject 24 Units into the skin at bedtime.      . lidocaine (XYLOCAINE) 2 % solution TAKE BY MOUTH FOUR TIMES DAILY  240 mL  1  . lidocaine-prilocaine (EMLA) cream Apply 1 application topically as needed.  30 g  2  . mirtazapine (REMERON) 30 MG tablet TAKE 1 TABLET BY MOUTH EVERY NIGHT AT BEDTIME  30 tablet  2  . ondansetron (ZOFRAN) 8 MG tablet Take 1 tablet (8 mg total) by mouth every 12 (twelve) hours as needed for nausea.  20 tablet  3  . oxyCODONE (OXY IR/ROXICODONE) 5 MG immediate release tablet Take 1 tablet (5 mg total) by mouth every 4 (four) hours as needed.  30 tablet  0  . oxyCODONE (OXYCONTIN) 10 MG 12 hr tablet Take 1 tablet (10 mg total) by  mouth  every 12 (twelve) hours. 02/14/13-pt states he only takes one /day  60 tablet  0  . ranitidine (ZANTAC) 150 MG tablet Take 150 mg by mouth 2 (two) times daily.       . tamsulosin (FLOMAX) 0.4 MG CAPS Take 0.4 mg by mouth 2 (two) times daily.      Marland Kitchen terazosin (HYTRIN) 2 MG capsule Take 2 mg by mouth at bedtime.         Family History  Problem Relation Age of Onset  . Breast cancer Sister   . Throat cancer Father      Review of Systems:     Cardiac Review of Systems: Y or N  Chest Pain [ y   ]  Resting SOB [ y  ] Exertional SOB  [ y ]  44 [ y ]   Pedal Edema [ y  ]    Palpitations [ n ] Syncope  [  ]   Presyncope [   ]  General Review of Systems: [Y] = yes [  ]=no Constitional: recent weight change [  ]; anorexia [  ]; fatigue [  ]; nausea [  ]; night sweats [  ]; fever [  ]; or chills [  ]                                                               Dental: poor dentition[  ]; Last Dentist visit:   Eye : blurred vision [  ]; diplopia [   ]; vision changes [  ];  Amaurosis fugax[  ]; Resp: cough [  ];  wheezing[  ];  hemoptysis[  ]; shortness of breath[  ]; paroxysmal nocturnal dyspnea[  ]; dyspnea on exertion[  ]; or orthopnea[  ];  GI:  gallstones[  ], vomiting[  ];  dysphagia[  ]; melena[  ];  hematochezia [  ]; heartburn[  ];   Hx of  Colonoscopy[  ]; GU: kidney stones [  ]; hematuria[  ];   dysuria [  ];  nocturia[  ];  history of     obstruction [  ]; urinary frequency [  ]             Skin: rash, swelling[  ];, hair loss[  ];  peripheral edema[  ];  or itching[  ]; Musculosketetal: myalgias[  ];  joint swelling[  ];  joint erythema[  ];  joint pain[  ];  back pain[  ];  Heme/Lymph: bruising[  ];  bleeding[  ];  anemia[  ];  Neuro: TIA[  ];  headaches[  ];  stroke[  ];  vertigo[  ];  seizures[  ];   paresthesias[  ];  difficulty walking[  ];  Psych:depression[  ]; anxiety[  ];  Endocrine: diabetes[  ];  thyroid dysfunction[  ];  Immunizations: Flu [  ]; Pneumococcal[   ];  Other:  Physical Exam: BP 112/70  Pulse 89  Temp(Src) 98.2 F (36.8 C) (Oral)  Resp 17  Ht 5\' 7"  (1.702 m)  Wt 144 lb 2.9 oz (65.4 kg)  BMI 22.58 kg/m2  SpO2 98%  General appearance: alert, cooperative, appears older than stated age, fatigued, mild distress and pale Neurologic: intact Heart: regular rate and rhythm, S1, S2 normal, no  murmur, click, rub or gallop and normal apical impulse Lungs: diminished breath sounds RLL and RML Abdomen: slight distention, ng inplace hypoactive bowel sounds Extremities: extremities normal, atraumatic, no cyanosis or edema and Homans sign is negative, no sign of DVT    Diagnostic Studies & Laboratory data:     Recent Radiology Findings:   Ct Chest W Contrast  05/04/2013   CLINICAL DATA:  Pancreatic carcinoma diagnosed 2011. Chemotherapy and radiation therapy completed. Small bowel obstruction.  EXAM: CT CHEST, ABDOMEN, AND PELVIS WITH CONTRAST  TECHNIQUE: Multidetector CT imaging of the chest, abdomen and pelvis was performed following the standard protocol during bolus administration of intravenous contrast.  CONTRAST:  OMNIPAQUE IOHEXOL 300 MG/ML  SOLN  COMPARISON:  CT 03/11/2013, plain films 05/03/2013  FINDINGS: CT CHEST FINDINGS  Port in the right anterior chest wall. No axillary or supraclavicular lymphadenopathy. No mediastinal or hilar lymphadenopathy. No pericardial fluid. There is a moderate-sized right pleural effusion which is increased in size compared to prior. There is passive atelectasis of the right middle lobe and right lower lobe. No obstructing lesion is identified. The lung parenchyma demonstrates mild ground-glass opacities in the right lobe adjacent to the effusion which is likely atelectasis. There is some peripheral ground-glass nodules in the left upper lobe which are new from prior (image 13). It likely infectious or inflammatory.  In the medial aspect of the right upper lobe there is nodular pleural thickening to 7 mm  which is increased from prior. Additional 5 mm nodule in medial aspect of the right middle lobe is increased from prior.  CT ABDOMEN AND PELVIS FINDINGS  Again demonstrated a low-density lesion lateral aspect of the left hepatic lobe measuring 3.3 x 4.3 cm unchanged from prior. No new hepatic lesions. In pneumobilia in the nondependent left hepatic lobe. There is a NG tube extending into the gastric antrum. Patient status post Whipple procedure. No new periportal adenopathy. There is a nodule along the ventral peritoneal surface inferior to the greater curvature of stomach measuring 7 mm (image 66) which is unchanged from prior. Within the right lower abdomen there is a 12 mm nodule within the mesenteries (image 96) which is similar to 10 mm on prior but slightly increased. Small nodule within the central mesentery measuring 8 mm (image 79) increased from 5 mm on prior  The stomach is normal. Dilated loops of small bowel up to 3.5 cm in the left abdomen. The distal small bowel is reduced caliber. Contrast does flow into the distal small bowel albeit a small amount. Contrast does not reach the terminal ileum. There is a caliber change between the proximal small bowel and distal small bowel. No clear transition point is identified but most likely in the mid central abdomen. No evidence of intraperitoneal free air or pneumatosis. No portal venous gas.  The bladder is distended. Prostate gland is normal. No pelvic lymphadenopathy.  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  Interval increase in pleural fluid and atelectasis in the right hemi thorax.  New ground-glass regions in the left upper lobe suggests inflammatory infectious process.  CT ABDOMEN AND PELVIS IMPRESSION  Partial mechanical small bowel obstruction with a transition point likely in the mid central abdomen. As there are  several peritoneal metastases, this could be the source of the partial obstruction.  Stable hepatic metastasis in the left hepatic lobe.  No  evidence of local pancreatic cancer recurrence.  Mild increase in size of several peritoneal metastasis.   Electronically Signed   By: Genevive Bi M.D.   On: 05/04/2013 17:47   Ct Abdomen Pelvis W Contrast  05/04/2013   CLINICAL DATA:  Pancreatic carcinoma diagnosed 2011. Chemotherapy and radiation therapy completed. Small bowel obstruction.  EXAM: CT CHEST, ABDOMEN, AND PELVIS WITH CONTRAST  TECHNIQUE: Multidetector CT imaging of the chest, abdomen and pelvis was performed following the standard protocol during bolus administration of intravenous contrast.  CONTRAST:  OMNIPAQUE IOHEXOL 300 MG/ML  SOLN  COMPARISON:  CT 03/11/2013, plain films 05/03/2013  FINDINGS: CT CHEST FINDINGS  Port in the right anterior chest wall. No axillary or supraclavicular lymphadenopathy. No mediastinal or hilar lymphadenopathy. No pericardial fluid. There is a moderate-sized right pleural effusion which is increased in size compared to prior. There is passive atelectasis of the right middle lobe and right lower lobe. No obstructing lesion is identified. The lung parenchyma demonstrates mild ground-glass opacities in the right lobe adjacent to the effusion which is likely atelectasis. There is some peripheral ground-glass nodules in the left upper lobe which are new from prior (image 13). It likely infectious or inflammatory.  In the medial aspect of the right upper lobe there is nodular pleural thickening to 7 mm which is increased from prior. Additional 5 mm nodule in medial aspect of the right middle lobe is increased from prior.  CT ABDOMEN AND PELVIS FINDINGS  Again demonstrated a low-density lesion lateral aspect of the left hepatic lobe measuring 3.3 x 4.3 cm unchanged from prior. No new hepatic lesions. In pneumobilia in the nondependent left hepatic lobe. There is a NG tube  extending into the gastric antrum. Patient status post Whipple procedure. No new periportal adenopathy. There is a nodule along the ventral peritoneal surface inferior to the greater curvature of stomach measuring 7 mm (image 66) which is unchanged from prior. Within the right lower abdomen there is a 12 mm nodule within the mesenteries (image 96) which is similar to 10 mm on prior but slightly increased. Small nodule within the central mesentery measuring 8 mm (image 79) increased from 5 mm on prior  The stomach is normal. Dilated loops of small bowel up to 3.5 cm in the left abdomen. The distal small bowel is reduced caliber. Contrast does flow into the distal small bowel albeit a small amount. Contrast does not reach the terminal ileum. There is a caliber change between the proximal small bowel and distal small bowel. No clear transition point is identified but most likely in the mid central abdomen. No evidence of intraperitoneal free air or pneumatosis. No portal venous gas.  The bladder is distended. Prostate gland is normal. No pelvic lymphadenopathy.  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  Interval increase in pleural fluid and atelectasis in the right hemi thorax.  New ground-glass regions in the left upper lobe suggests inflammatory infectious process.  CT ABDOMEN AND PELVIS IMPRESSION  Partial mechanical small bowel obstruction with a transition point likely in the mid central abdomen. As there are several peritoneal metastases, this could be the source of the partial obstruction.  Stable hepatic metastasis in the left hepatic lobe.  No evidence of local pancreatic cancer recurrence.  Mild increase in size of several peritoneal metastasis.   Electronically Signed   By: Genevive Bi  M.D.   On: 05/04/2013 17:47   Dg Chest Port 1 View  05/05/2013   CLINICAL DATA:   Assess pleural effusion, history hypertension, diabetes, pancreatic cancer  EXAM: PORTABLE CHEST - 1 VIEW  COMPARISON:  Portable exam 0510 hr compared to 05/04/2013  FINDINGS: Right jugular Port-A-Cath stable tip projecting over SVC.  Nasogastric tube extends into stomach.  Minimal enlargement of cardiac silhouette.  Mediastinal contours normal.  Large right pleural effusion with significant atelectasis of the right lung.  Underlying infiltrate and mass not excluded with this appearance.  Minimal pulmonary vascular congestion with left lung clear.  No pneumothorax.  IMPRESSION: Persistent large right pleural effusion with significant compressive atelectasis of the right lung.   Electronically Signed   By: Ulyses Southward M.D.   On: 05/05/2013 08:02   Dg Chest Port 1 View  05/04/2013   ADDENDUM REPORT: 05/04/2013 16:37  ADDENDUM: Upon further review and consultation with the ordering physician, no endotracheal tube is present on the study. Initial dictation by Dr. Reche Dixon and addendum by Dr. Amil Amen   Electronically Signed   By: Genevive Bi M.D.   On: 05/04/2013 16:37   05/04/2013   CLINICAL DATA:  Evaluate pleural effusion. Shortness of Breath. Weakness.  EXAM: PORTABLE CHEST - 1 VIEW  COMPARISON:  None 914  FINDINGS: Endotracheal tube terminates 4.8 cm above Carina. Nasogastric tube extends beyond the inferior aspect of the film.  Right internal jugular line terminates at mid SVC.  The chin overlies the apices. Cardiomegaly, exaggerated by AP portable technique. Right sided pleural effusion is moderate and not significantly changed. The left pleural effusion. No significant change in airspace disease within the right mid and lower lung. Left lung remains clear.  IMPRESSION: No significant change in moderate right-sided pleural effusion and adjacent airspace opacity.  Interval placement of endotracheal tube, terminating 4.8 cm above Carina.  Electronically Signed: By: Jeronimo Greaves On: 05/04/2013 14:32   Dg  Chest Port 1 View  05/03/2013   CLINICAL DATA:  Right pleural effusion. Status post thoracentesis.  EXAM: PORTABLE CHEST - 1 VIEW  COMPARISON:  05/03/2013  FINDINGS: A moderate right pleural effusion shows interval decrease in size. No pneumothorax identified. Right lower lung atelectasis or consolidation again demonstrated. Left lung is clear. Heart size is stable. Right-sided power port remains in appropriate position. A new nasogastric tube is seen with tip in the proximal stomach.  IMPRESSION: Decreased size of right pleural effusion. No pneumothorax identified. Persistent right lower lung atelectasis versus consolidation.   Electronically Signed   By: Myles Rosenthal   On: 05/03/2013 19:08   Dg Abd Portable 1v  05/05/2013   CLINICAL DATA:  Small bowel obstruction  EXAM: PORTABLE ABDOMEN - 1 VIEW  COMPARISON:  05/03/2013  Correlation: CT abdomen and pelvis 05/04/2013  FINDINGS: Nasogastric tube in stomach.  Oral contrast administered from prior CT is identified within the distal small bowel and colon.  However persistent dilatation of proximal small bowel loops in left abdomen compatible with persistent small bowel obstruction.  Excreted contrast within urinary bladder and renal collecting systems, with right renal collecting system slightly prominent as noted on CT.  No definite bowel wall thickening.  IMPRESSION: Persistent small bowel obstruction.   Electronically Signed   By: Ulyses Southward M.D.   On: 05/05/2013 08:07      Recent Lab Findings: Lab Results  Component Value Date   WBC 2.1* 05/05/2013   HGB 10.4* 05/05/2013   HCT 30.9* 05/05/2013   PLT  245 05/05/2013   GLUCOSE 82 05/05/2013   ALT 12 05/05/2013   AST 17 05/05/2013   NA 134* 05/05/2013   K 3.0* 05/05/2013   CL 91* 05/05/2013   CREATININE 1.32 05/05/2013   BUN 29* 05/05/2013   CO2 36* 05/05/2013   INR 1.39 05/03/2013      Assessment / Plan:      Stage IV pancreatic cancer with recurrent rt effusion  I have recommended to the patient  placement of rt pleurix cath to control effusion. Will plan for am at [facility] Risks and options dicussed with patient and family       Delight Ovens MD      [address] [place].[address] [place] [address] Office [phone]   Beeper [phone]  05/05/2013 6:27 PM

## 2013-05-05 NOTE — Progress Notes (Signed)
PULMONARY  / CRITICAL CARE MEDICINE  Name: Philip Shaw MRN: [id number] DOB: Sep 10, 1934    ADMISSION DATE:  05/03/2013 CONSULTATION DATE:  9/10  REFERRING MD :  Ardyth Harps PRIMARY SERVICE:  [facility]   CHIEF COMPLAINT:  Right effusion   BRIEF PATIENT DESCRIPTION:  78 year old male w/ known metastatic pancreatic cancer now on palliative chemo. Admitted on 9/9 with SBO and dyspnea. Found to have large right effusion of eval for dyspnea. PCCM asked to see for effusion.   SIGNIFICANT EVENTS / STUDIES:  Site: right effusion Date: 9/9  Pleural fluid Serum   LDH: 191 LDH: 279  Protein: 4 Protein:  Cholesterol: not sent  Cholesterol: not drawn  Afb:not sent    Gm stain: negative   Culture:   Fungal: not drawn   Hct: not drawn   Color: cloudy   Wbc: 195   Neutrophils: 5%   Lymph: 23%   Ph: 8.5   Rheumatoid factor: not sent   Adenosine Deaminase: not drawn   Glucose: 99   Cytology:       Special notes:   1.8 lit off, stopped to avoid re expansion edema    LINES / TUBES:  CULTURES: Pleural fluid 9/9>>>ng  ANTIBIOTICS: Cefepime 9/9>>> vanc 9/9>>> 9/11  SUBJECTIVE:  Afebrile High NG ouput  VITAL SIGNS: Temp:  [97.3 F (36.3 C)-98.5 F (36.9 C)] 98 F (36.7 C) (09/11 0530) Pulse Rate:  [73-93] 92 (09/11 0530) Resp:  [16-20] 18 (09/11 0530) BP: (102-125)/(54-95) 116/66 mmHg (09/11 0530) SpO2:  [94 %-99 %] 95 % (09/11 0530)  PHYSICAL EXAMINATION: General:  Chronically ill appearing white male, no acute distress Neuro:  No focal def  HEENT:  NGT in nare, neck veins flat, MMM  Cardiovascular:  rrr Lungs:  Decreased on right  Abdomen:  Soft, + bowel sounds  Musculoskeletal:  Intact  Skin:  Intact    Recent Labs Lab 05/03/13 1230 05/04/13 0455 05/05/13 0430  NA 131* 131* 134*  K 3.9 3.5 3.0*  CL 83* 89* 91*  CO2 35* 36* 36*  BUN 30* 36* 29*  CREATININE 0.99 1.26 1.32  GLUCOSE 115* 167* 82    Recent Labs Lab 05/03/13 1230 05/04/13 0455 05/05/13 0430   HGB 9.2*  9.3* 7.9* 10.4*  HCT 27.7*  28.0* 24.2* 30.9*  WBC 1.5*  1.5* 2.4* 2.1*  PLT 307  310 261 245   Dg Chest 2 View (if Patient Has Fever And/or Copd)  05/03/2013   *RADIOLOGY REPORT*  Clinical Data: Shortness of breath, vomiting  CHEST - 2 VIEW  Comparison: 03/11/2013  Findings: There is large right pleural effusion with atelectasis or infiltrate in the right lower lobe.  Atelectasis is noted also in the right upper lobe.  There is only aeration of the upper segments of the right upper lobe.  Left lung is clear.  Right IJ Port-A-Cath with tip in SVC.  No pulmonary edema.  IMPRESSION: Large right pleural effusion with atelectasis or infiltrate in the right midlung and right lower lobe.  Left lung is clear.  No pulmonary edema.   Original Report Authenticated By: Natasha Mead, M.D.   Ct Chest W Contrast  05/04/2013   CLINICAL DATA:  Pancreatic carcinoma diagnosed 2011. Chemotherapy and radiation therapy completed. Small bowel obstruction.  EXAM: CT CHEST, ABDOMEN, AND PELVIS WITH CONTRAST  TECHNIQUE: Multidetector CT imaging of the chest, abdomen and pelvis was performed following the standard protocol during bolus administration of intravenous contrast.  CONTRAST:  OMNIPAQUE IOHEXOL  300 MG/ML  SOLN  COMPARISON:  CT 03/11/2013, plain films 05/03/2013  FINDINGS: CT CHEST FINDINGS  Port in the right anterior chest wall. No axillary or supraclavicular lymphadenopathy. No mediastinal or hilar lymphadenopathy. No pericardial fluid. There is a moderate-sized right pleural effusion which is increased in size compared to prior. There is passive atelectasis of the right middle lobe and right lower lobe. No obstructing lesion is identified. The lung parenchyma demonstrates mild ground-glass opacities in the right lobe adjacent to the effusion which is likely atelectasis. There is some peripheral ground-glass nodules in the left upper lobe which are new from prior (image 13). It likely infectious or  inflammatory.  In the medial aspect of the right upper lobe there is nodular pleural thickening to 7 mm which is increased from prior. Additional 5 mm nodule in medial aspect of the right middle lobe is increased from prior.  CT ABDOMEN AND PELVIS FINDINGS  Again demonstrated a low-density lesion lateral aspect of the left hepatic lobe measuring 3.3 x 4.3 cm unchanged from prior. No new hepatic lesions. In pneumobilia in the nondependent left hepatic lobe. There is a NG tube extending into the gastric antrum. Patient status post Whipple procedure. No new periportal adenopathy. There is a nodule along the ventral peritoneal surface inferior to the greater curvature of stomach measuring 7 mm (image 66) which is unchanged from prior. Within the right lower abdomen there is a 12 mm nodule within the mesenteries (image 96) which is similar to 10 mm on prior but slightly increased. Small nodule within the central mesentery measuring 8 mm (image 79) increased from 5 mm on prior  The stomach is normal. Dilated loops of small bowel up to 3.5 cm in the left abdomen. The distal small bowel is reduced caliber. Contrast does flow into the distal small bowel albeit a small amount. Contrast does not reach the terminal ileum. There is a caliber change between the proximal small bowel and distal small bowel. No clear transition point is identified but most likely in the mid central abdomen. No evidence of intraperitoneal free air or pneumatosis. No portal venous gas.  The bladder is distended. Prostate gland is normal. No pelvic lymphadenopathy.  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  Interval increase in pleural fluid and atelectasis in the right hemi thorax.  New ground-glass regions in the left upper lobe suggests inflammatory infectious process.  CT ABDOMEN AND PELVIS IMPRESSION  Partial  mechanical small bowel obstruction with a transition point likely in the mid central abdomen. As there are several peritoneal metastases, this could be the source of the partial obstruction.  Stable hepatic metastasis in the left hepatic lobe.  No evidence of local pancreatic cancer recurrence.  Mild increase in size of several peritoneal metastasis.   Electronically Signed   By: Genevive Bi M.D.   On: 05/04/2013 17:47   Ct Abdomen Pelvis W Contrast  05/04/2013   CLINICAL DATA:  Pancreatic carcinoma diagnosed 2011. Chemotherapy and radiation therapy completed. Small bowel obstruction.  EXAM: CT CHEST, ABDOMEN, AND PELVIS WITH CONTRAST  TECHNIQUE: Multidetector CT imaging of the chest, abdomen and pelvis was performed following the standard protocol during bolus administration of intravenous contrast.  CONTRAST:  OMNIPAQUE IOHEXOL 300 MG/ML  SOLN  COMPARISON:  CT 03/11/2013, plain films 05/03/2013  FINDINGS: CT CHEST FINDINGS  Port in the right anterior chest  wall. No axillary or supraclavicular lymphadenopathy. No mediastinal or hilar lymphadenopathy. No pericardial fluid. There is a moderate-sized right pleural effusion which is increased in size compared to prior. There is passive atelectasis of the right middle lobe and right lower lobe. No obstructing lesion is identified. The lung parenchyma demonstrates mild ground-glass opacities in the right lobe adjacent to the effusion which is likely atelectasis. There is some peripheral ground-glass nodules in the left upper lobe which are new from prior (image 13). It likely infectious or inflammatory.  In the medial aspect of the right upper lobe there is nodular pleural thickening to 7 mm which is increased from prior. Additional 5 mm nodule in medial aspect of the right middle lobe is increased from prior.  CT ABDOMEN AND PELVIS FINDINGS  Again demonstrated a low-density lesion lateral aspect of the left hepatic lobe measuring 3.3 x 4.3 cm unchanged  from prior. No new hepatic lesions. In pneumobilia in the nondependent left hepatic lobe. There is a NG tube extending into the gastric antrum. Patient status post Whipple procedure. No new periportal adenopathy. There is a nodule along the ventral peritoneal surface inferior to the greater curvature of stomach measuring 7 mm (image 66) which is unchanged from prior. Within the right lower abdomen there is a 12 mm nodule within the mesenteries (image 96) which is similar to 10 mm on prior but slightly increased. Small nodule within the central mesentery measuring 8 mm (image 79) increased from 5 mm on prior  The stomach is normal. Dilated loops of small bowel up to 3.5 cm in the left abdomen. The distal small bowel is reduced caliber. Contrast does flow into the distal small bowel albeit a small amount. Contrast does not reach the terminal ileum. There is a caliber change between the proximal small bowel and distal small bowel. No clear transition point is identified but most likely in the mid central abdomen. No evidence of intraperitoneal free air or pneumatosis. No portal venous gas.  The bladder is distended. Prostate gland is normal. No pelvic lymphadenopathy.  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  Interval increase in pleural fluid and atelectasis in the right hemi thorax.  New ground-glass regions in the left upper lobe suggests inflammatory infectious process.  CT ABDOMEN AND PELVIS IMPRESSION  Partial mechanical small bowel obstruction with a transition point likely in the mid central abdomen. As there are several peritoneal metastases, this could be the source of the partial obstruction.  Stable hepatic metastasis in the left hepatic lobe.  No evidence of local pancreatic cancer recurrence.  Mild increase in size of several peritoneal metastasis.   Electronically Signed    By: Genevive Bi M.D.   On: 05/04/2013 17:47   Dg Chest Port 1 View  05/05/2013   CLINICAL DATA:  Assess pleural effusion, history hypertension, diabetes, pancreatic cancer  EXAM: PORTABLE CHEST - 1 VIEW  COMPARISON:  Portable exam 0510 hr compared to 05/04/2013  FINDINGS: Right jugular Port-A-Cath stable tip projecting over SVC.  Nasogastric tube extends into stomach.  Minimal enlargement of cardiac silhouette.  Mediastinal contours normal.  Large right pleural effusion with significant atelectasis of the right lung.  Underlying infiltrate and mass not excluded with this appearance.  Minimal pulmonary vascular congestion with left lung clear.  No pneumothorax.  IMPRESSION: Persistent large right pleural effusion with significant compressive atelectasis of the  right lung.   Electronically Signed   By: Ulyses Southward M.D.   On: 05/05/2013 08:02   Dg Chest Port 1 View  05/04/2013   ADDENDUM REPORT: 05/04/2013 16:37  ADDENDUM: Upon further review and consultation with the ordering physician, no endotracheal tube is present on the study. Initial dictation by Dr. Reche Dixon and addendum by Dr. Amil Amen   Electronically Signed   By: Genevive Bi M.D.   On: 05/04/2013 16:37   05/04/2013   CLINICAL DATA:  Evaluate pleural effusion. Shortness of Breath. Weakness.  EXAM: PORTABLE CHEST - 1 VIEW  COMPARISON:  None 914  FINDINGS: Endotracheal tube terminates 4.8 cm above Carina. Nasogastric tube extends beyond the inferior aspect of the film.  Right internal jugular line terminates at mid SVC.  The chin overlies the apices. Cardiomegaly, exaggerated by AP portable technique. Right sided pleural effusion is moderate and not significantly changed. The left pleural effusion. No significant change in airspace disease within the right mid and lower lung. Left lung remains clear.  IMPRESSION: No significant change in moderate right-sided pleural effusion and adjacent airspace opacity.  Interval placement of endotracheal  tube, terminating 4.8 cm above Carina.  Electronically Signed: By: Jeronimo Greaves On: 05/04/2013 14:32   Dg Chest Port 1 View  05/03/2013   CLINICAL DATA:  Right pleural effusion. Status post thoracentesis.  EXAM: PORTABLE CHEST - 1 VIEW  COMPARISON:  05/03/2013  FINDINGS: A moderate right pleural effusion shows interval decrease in size. No pneumothorax identified. Right lower lung atelectasis or consolidation again demonstrated. Left lung is clear. Heart size is stable. Right-sided power port remains in appropriate position. A new nasogastric tube is seen with tip in the proximal stomach.  IMPRESSION: Decreased size of right pleural effusion. No pneumothorax identified. Persistent right lower lung atelectasis versus consolidation.   Electronically Signed   By: Myles Rosenthal   On: 05/03/2013 19:08   Dg Abd Portable 1v  05/05/2013   CLINICAL DATA:  Small bowel obstruction  EXAM: PORTABLE ABDOMEN - 1 VIEW  COMPARISON:  05/03/2013  Correlation: CT abdomen and pelvis 05/04/2013  FINDINGS: Nasogastric tube in stomach.  Oral contrast administered from prior CT is identified within the distal small bowel and colon.  However persistent dilatation of proximal small bowel loops in left abdomen compatible with persistent small bowel obstruction.  Excreted contrast within urinary bladder and renal collecting systems, with right renal collecting system slightly prominent as noted on CT.  No definite bowel wall thickening.  IMPRESSION: Persistent small bowel obstruction.   Electronically Signed   By: Ulyses Southward M.D.   On: 05/05/2013 08:07   Dg Abd Portable 2v  05/03/2013   *RADIOLOGY REPORT*  Clinical Data: Nausea, vomiting, bloating, question small bowel obstruction  PORTABLE ABDOMEN - 2 VIEW  Comparison: None Correlation:  CT abdomen pelvis 03/11/2013  Findings: Gaseous distention of stomach. Markedly distended small bowel loops up to 7.2 cm diameter. Decompressed distal small bowel loops in colon. Findings are consistent  with a high-grade proximal to mid small bowel obstruction. No definite free intraperitoneal air. Bones diffusely demineralized. Nonobstructing right renal calculi.  IMPRESSION: Marked distention of proximal small bowel loops and gaseous distention of stomach consistent with a high-grade proximal to mid small bowel obstruction.   Original Report Authenticated By: Ulyses Southward, M.D.    ASSESSMENT / PLAN: Metastatic pancreatic cancer (adenocarcinoma) w/ mets to liver and abd wall. He is currently on palliative Chemo with: FOLFIRI (5FU, Leucovorin and Irinotecan) every 2 weeks. First  cycle was given on 12/06/2012 . S/P 8 cycles.  Ileus, r/o SBO Plan:  Further recs per onc / surgery  Large exudative right pleural effusion in setting of metastatic pancreatic cancer. He is s/p large vol thoracentesis 9/9 of 1.9 liters. F/U CT w/ concern about pleural based nodules in RUL and RML. PLeural fluid POS for adenoCA c/w malignant pleural effusion.   Plan: Discussed optiosn with pt & daughter - plan for  pleurex cath (current volume of effusion should facilitate Pleurex w/out difficulty) Discussed QOL issues I have called CT surgery - may have to wait until SBO resolved  SBO: this is likely due to progression of his cancer.  Plan: NG to LIS, corect K  PCCM to sign off  Cyril Mourning MD. Tonny Bollman. [facility] Pager [phone] If no response call [phone]

## 2013-05-05 NOTE — Progress Notes (Signed)
Subjective: The patient is seen and examined today. His wife and daughter were at the bedside. He is feeling much better. He isstill on NG tube suctioning. The daughter is concerned about his nutrition as he is currently n.p.o.. he denied having any fever or chills. He continues to have mild pain in the right side of the chest as well as shortness of breath with exertion. He received 2 units of PRBCs transfusion last night. He also had repeat CT scan of the chest, abdomen and pelvis for restaging of his disease.   Objective: Vital signs in last 24 hours: Temp:  [97.5 F (36.4 C)-98.5 F (36.9 C)] 98.2 F (36.8 C) (09/11 1400) Pulse Rate:  [73-93] 89 (09/11 1400) Resp:  [17-20] 17 (09/11 1400) BP: (106-125)/(61-95) 112/70 mmHg (09/11 1400) SpO2:  [94 %-99 %] 98 % (09/11 1400)  Intake/Output from previous day: 09/10 0701 - 09/11 0700 In: 1454.6 [I.V.:698.3; Blood:706.3; IV Piggyback:50] Out: 2565 [Urine:2365; Emesis/NG output:200] Intake/Output this shift: Total I/O In: 1250 [I.V.:700; NG/GT:450; IV Piggyback:100] Out: -   General appearance: alert, cooperative and no distress Resp: diminished breath sounds RLL and dullness to percussion RLL Cardio: regular rate and rhythm, S1, S2 normal, no murmur, click, rub or gallop GI: soft, non-tender; bowel sounds normal; no masses,  no organomegaly Extremities: extremities normal, atraumatic, no cyanosis or edema  Lab Results:   Recent Labs  05/04/13 0455 05/05/13 0430  WBC 2.4* 2.1*  HGB 7.9* 10.4*  HCT 24.2* 30.9*  PLT 261 245   BMET  Recent Labs  05/04/13 0455 05/05/13 0430  NA 131* 134*  K 3.5 3.0*  CL 89* 91*  CO2 36* 36*  GLUCOSE 167* 82  BUN 36* 29*  CREATININE 1.26 1.32  CALCIUM 8.3* 8.0*    Studies/Results: Ct Chest W Contrast  05/04/2013   CLINICAL DATA:  Pancreatic carcinoma diagnosed 2011. Chemotherapy and radiation therapy completed. Small bowel obstruction.  EXAM: CT CHEST, ABDOMEN, AND PELVIS WITH  CONTRAST  TECHNIQUE: Multidetector CT imaging of the chest, abdomen and pelvis was performed following the standard protocol during bolus administration of intravenous contrast.  CONTRAST:  OMNIPAQUE IOHEXOL 300 MG/ML  SOLN  COMPARISON:  CT 03/11/2013, plain films 05/03/2013  FINDINGS: CT CHEST FINDINGS  Port in the right anterior chest wall. No axillary or supraclavicular lymphadenopathy. No mediastinal or hilar lymphadenopathy. No pericardial fluid. There is a moderate-sized right pleural effusion which is increased in size compared to prior. There is passive atelectasis of the right middle lobe and right lower lobe. No obstructing lesion is identified. The lung parenchyma demonstrates mild ground-glass opacities in the right lobe adjacent to the effusion which is likely atelectasis. There is some peripheral ground-glass nodules in the left upper lobe which are new from prior (image 13). It likely infectious or inflammatory.  In the medial aspect of the right upper lobe there is nodular pleural thickening to 7 mm which is increased from prior. Additional 5 mm nodule in medial aspect of the right middle lobe is increased from prior.  CT ABDOMEN AND PELVIS FINDINGS  Again demonstrated a low-density lesion lateral aspect of the left hepatic lobe measuring 3.3 x 4.3 cm unchanged from prior. No new hepatic lesions. In pneumobilia in the nondependent left hepatic lobe. There is a NG tube extending into the gastric antrum. Patient status post Whipple procedure. No new periportal adenopathy. There is a nodule along the ventral peritoneal surface inferior to the greater curvature of stomach measuring 7 mm (image 66) which  is unchanged from prior. Within the right lower abdomen there is a 12 mm nodule within the mesenteries (image 96) which is similar to 10 mm on prior but slightly increased. Small nodule within the central mesentery measuring 8 mm (image 79) increased from 5 mm on prior  The stomach is normal.  Dilated loops of small bowel up to 3.5 cm in the left abdomen. The distal small bowel is reduced caliber. Contrast does flow into the distal small bowel albeit a small amount. Contrast does not reach the terminal ileum. There is a caliber change between the proximal small bowel and distal small bowel. No clear transition point is identified but most likely in the mid central abdomen. No evidence of intraperitoneal free air or pneumatosis. No portal venous gas.  The bladder is distended. Prostate gland is normal. No pelvic lymphadenopathy.  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  Interval increase in pleural fluid and atelectasis in the right hemi thorax.  New ground-glass regions in the left upper lobe suggests inflammatory infectious process.  CT ABDOMEN AND PELVIS IMPRESSION  Partial mechanical small bowel obstruction with a transition point likely in the mid central abdomen. As there are several peritoneal metastases, this could be the source of the partial obstruction.  Stable hepatic metastasis in the left hepatic lobe.  No evidence of local pancreatic cancer recurrence.  Mild increase in size of several peritoneal metastasis.   Electronically Signed   By: Genevive Bi M.D.   On: 05/04/2013 17:47   Ct Abdomen Pelvis W Contrast  05/04/2013   CLINICAL DATA:  Pancreatic carcinoma diagnosed 2011. Chemotherapy and radiation therapy completed. Small bowel obstruction.  EXAM: CT CHEST, ABDOMEN, AND PELVIS WITH CONTRAST  TECHNIQUE: Multidetector CT imaging of the chest, abdomen and pelvis was performed following the standard protocol during bolus administration of intravenous contrast.  CONTRAST:  OMNIPAQUE IOHEXOL 300 MG/ML  SOLN  COMPARISON:  CT 03/11/2013, plain films 05/03/2013  FINDINGS: CT CHEST FINDINGS  Port in the right anterior chest wall. No axillary or  supraclavicular lymphadenopathy. No mediastinal or hilar lymphadenopathy. No pericardial fluid. There is a moderate-sized right pleural effusion which is increased in size compared to prior. There is passive atelectasis of the right middle lobe and right lower lobe. No obstructing lesion is identified. The lung parenchyma demonstrates mild ground-glass opacities in the right lobe adjacent to the effusion which is likely atelectasis. There is some peripheral ground-glass nodules in the left upper lobe which are new from prior (image 13). It likely infectious or inflammatory.  In the medial aspect of the right upper lobe there is nodular pleural thickening to 7 mm which is increased from prior. Additional 5 mm nodule in medial aspect of the right middle lobe is increased from prior.  CT ABDOMEN AND PELVIS FINDINGS  Again demonstrated a low-density lesion lateral aspect of the left hepatic lobe measuring 3.3 x 4.3 cm unchanged from prior. No new hepatic lesions. In pneumobilia in the nondependent left hepatic lobe. There is a NG tube extending into the gastric antrum. Patient status post Whipple procedure. No new periportal adenopathy. There is a nodule along the ventral peritoneal surface inferior to the greater curvature of stomach measuring 7 mm (image 66) which is unchanged from prior. Within the right lower abdomen there is a 12 mm nodule within the mesenteries (image 96) which is similar to  10 mm on prior but slightly increased. Small nodule within the central mesentery measuring 8 mm (image 79) increased from 5 mm on prior  The stomach is normal. Dilated loops of small bowel up to 3.5 cm in the left abdomen. The distal small bowel is reduced caliber. Contrast does flow into the distal small bowel albeit a small amount. Contrast does not reach the terminal ileum. There is a caliber change between the proximal small bowel and distal small bowel. No clear transition point is identified but most likely in the mid  central abdomen. No evidence of intraperitoneal free air or pneumatosis. No portal venous gas.  The bladder is distended. Prostate gland is normal. No pelvic lymphadenopathy.  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  Interval increase in pleural fluid and atelectasis in the right hemi thorax.  New ground-glass regions in the left upper lobe suggests inflammatory infectious process.  CT ABDOMEN AND PELVIS IMPRESSION  Partial mechanical small bowel obstruction with a transition point likely in the mid central abdomen. As there are several peritoneal metastases, this could be the source of the partial obstruction.  Stable hepatic metastasis in the left hepatic lobe.  No evidence of local pancreatic cancer recurrence.  Mild increase in size of several peritoneal metastasis.   Electronically Signed   By: Genevive Bi M.D.   On: 05/04/2013 17:47   Dg Chest Port 1 View  05/05/2013   CLINICAL DATA:  Assess pleural effusion, history hypertension, diabetes, pancreatic cancer  EXAM: PORTABLE CHEST - 1 VIEW  COMPARISON:  Portable exam 0510 hr compared to 05/04/2013  FINDINGS: Right jugular Port-A-Cath stable tip projecting over SVC.  Nasogastric tube extends into stomach.  Minimal enlargement of cardiac silhouette.  Mediastinal contours normal.  Large right pleural effusion with significant atelectasis of the right lung.  Underlying infiltrate and mass not excluded with this appearance.  Minimal pulmonary vascular congestion with left lung clear.  No pneumothorax.  IMPRESSION: Persistent large right pleural effusion with significant compressive atelectasis of the right lung.   Electronically Signed   By: Ulyses Southward M.D.   On: 05/05/2013 08:02   Dg Chest Port 1 View  05/04/2013   ADDENDUM REPORT: 05/04/2013 16:37  ADDENDUM: Upon further review and consultation with the ordering  physician, no endotracheal tube is present on the study. Initial dictation by Dr. Reche Dixon and addendum by Dr. Amil Amen   Electronically Signed   By: Genevive Bi M.D.   On: 05/04/2013 16:37   05/04/2013   CLINICAL DATA:  Evaluate pleural effusion. Shortness of Breath. Weakness.  EXAM: PORTABLE CHEST - 1 VIEW  COMPARISON:  None 914  FINDINGS: Endotracheal tube terminates 4.8 cm above Carina. Nasogastric tube extends beyond the inferior aspect of the film.  Right internal jugular line terminates at mid SVC.  The chin overlies the apices. Cardiomegaly, exaggerated by AP portable technique. Right sided pleural effusion is moderate and not significantly changed. The left pleural effusion. No significant change in airspace disease within the right mid and lower lung. Left lung remains clear.  IMPRESSION: No significant change in moderate right-sided pleural effusion and adjacent airspace opacity.  Interval placement of endotracheal tube, terminating 4.8 cm above Carina.  Electronically Signed: By: Jeronimo Greaves On: 05/04/2013 14:32   Dg Chest Port 1 View  05/03/2013   CLINICAL DATA:  Right pleural effusion. Status post thoracentesis.  EXAM: PORTABLE CHEST - 1 VIEW  COMPARISON:  05/03/2013  FINDINGS: A moderate right pleural effusion shows interval decrease in size. No pneumothorax identified. Right lower lung atelectasis or consolidation again demonstrated. Left lung is clear. Heart size is stable. Right-sided power port remains in appropriate position. A new nasogastric tube is seen with tip in the proximal stomach.  IMPRESSION: Decreased size of right pleural effusion. No pneumothorax identified. Persistent right lower lung atelectasis versus consolidation.   Electronically Signed   By: Myles Rosenthal   On: 05/03/2013 19:08   Dg Abd Portable 1v  05/05/2013   CLINICAL DATA:  Small bowel obstruction  EXAM: PORTABLE ABDOMEN - 1 VIEW  COMPARISON:  05/03/2013  Correlation: CT abdomen and pelvis 05/04/2013  FINDINGS:  Nasogastric tube in stomach.  Oral contrast administered from prior CT is identified within the distal small bowel and colon.  However persistent dilatation of proximal small bowel loops in left abdomen compatible with persistent small bowel obstruction.  Excreted contrast within urinary bladder and renal collecting systems, with right renal collecting system slightly prominent as noted on CT.  No definite bowel wall thickening.  IMPRESSION: Persistent small bowel obstruction.   Electronically Signed   By: Ulyses Southward M.D.   On: 05/05/2013 08:07    Medications: I have reviewed the patient's current medications.  Assessment/Plan: 1) Metastatic pancreatic adenocarcinoma status post several chemotherapy regimen and currently on treatment with FOLFIRI. I will continue to hold his chemotherapy for now. CT scan of the chest, abdomen and pelvis showed no significant evidence for disease progression except for the right pleural effusion which is concerning for transpleural spread of the tumor.  2) right-sided pleural effusion, status post right thoracentesis. The patient would need repeat right-sided thoracentesis.  3) small bowel obstruction could be secondary to peritoneal carcinomatosis. No evidence for disease progression in the liver. The patient continues to have peritoneal carcinomatosis which may have contributed to his small bowel obstruction. Continue NG tube suction. The patient is followed by surgery.  4) chemotherapy-induced anemia: Improved after the patient received 2 units of PRBCs transfusion.  5) questionable pneumonia: continue Maxipime and vancomycin.  6) nausea and vomiting: secondary to small bowel obstruction. Improved.    LOS: 2 days    Takesha Steger K. 05/05/2013

## 2013-05-05 NOTE — Progress Notes (Signed)
At 1830, Dr. Tyrone Sage requested to get notes from PCP office. Request sent but office is closed. Philip Shaw A

## 2013-05-06 ENCOUNTER — Inpatient Hospital Stay (HOSPITAL_COMMUNITY): Payer: Medicare Other

## 2013-05-06 ENCOUNTER — Inpatient Hospital Stay (HOSPITAL_COMMUNITY): Payer: Medicare Other | Admitting: Anesthesiology

## 2013-05-06 ENCOUNTER — Encounter (HOSPITAL_COMMUNITY): Payer: Self-pay | Admitting: Anesthesiology

## 2013-05-06 ENCOUNTER — Encounter (HOSPITAL_COMMUNITY)
Admission: EM | Disposition: A | Discharge status: Home / Self Care | Payer: Self-pay | Source: Home / Self Care | Attending: Internal Medicine

## 2013-05-06 ENCOUNTER — Encounter (HOSPITAL_COMMUNITY): Payer: Self-pay | Admitting: Certified Registered"

## 2013-05-06 DIAGNOSIS — J90 Pleural effusion, not elsewhere classified: Secondary | ICD-10-CM

## 2013-05-06 HISTORY — PX: CHEST TUBE INSERTION: SHX231

## 2013-05-06 LAB — GLUCOSE, CAPILLARY
Glucose-Capillary: 133 mg/dL — ABNORMAL HIGH (ref 70–99)
Glucose-Capillary: 72 mg/dL (ref 70–99)
Glucose-Capillary: 59 mg/dL — ABNORMAL LOW (ref 70–99)
Glucose-Capillary: 76 mg/dL (ref 70–99)

## 2013-05-06 SURGERY — INSERTION, PLEURAL DRAINAGE CATHETER
Anesthesia: Monitor Anesthesia Care | Site: Chest | Laterality: Right | Wound class: Clean

## 2013-05-06 MED ORDER — LIDOCAINE HCL 1 % IJ SOLN
INTRAMUSCULAR | Status: DC | PRN
  Administered 2013-05-06: 10 mL via INTRADERMAL

## 2013-05-06 MED ORDER — DEXTROSE 50 % IV SOLN
25.0000 mL | Freq: Once | INTRAVENOUS | Status: AC | PRN
  Administered 2013-05-06: 25 mL via INTRAVENOUS
  Filled 2013-05-06: qty 50

## 2013-05-06 MED ORDER — MIDAZOLAM HCL 5 MG/5ML IJ SOLN
INTRAMUSCULAR | Status: DC | PRN
  Administered 2013-05-06: 2 mg via INTRAVENOUS

## 2013-05-06 MED ORDER — DEXTROSE 50 % IV SOLN
INTRAVENOUS | Status: AC
  Filled 2013-05-06: qty 50

## 2013-05-06 MED ORDER — LACTATED RINGERS IV SOLN
INTRAVENOUS | Status: DC
  Administered 2013-05-06: 09:00:00 via INTRAVENOUS

## 2013-05-06 MED ORDER — POTASSIUM CHLORIDE 10 MEQ/100ML IV SOLN
10.0000 meq | INTRAVENOUS | Status: AC
  Administered 2013-05-06 (×3): 10 meq via INTRAVENOUS
  Filled 2013-05-06 (×3): qty 100

## 2013-05-06 MED ORDER — DEXTROSE 50 % IV SOLN
25.0000 mL | Freq: Once | INTRAVENOUS | Status: AC | PRN
  Administered 2013-05-06: 25 mL via INTRAVENOUS

## 2013-05-06 MED ORDER — DEXTROSE 50 % IV SOLN
25.0000 mL | Freq: Once | INTRAVENOUS | Status: AC | PRN
  Filled 2013-05-06: qty 50

## 2013-05-06 MED ORDER — FENTANYL CITRATE 0.05 MG/ML IJ SOLN
INTRAMUSCULAR | Status: DC | PRN
  Administered 2013-05-06 (×2): 50 ug via INTRAVENOUS

## 2013-05-06 MED ORDER — INSULIN ASPART 100 UNIT/ML ~~LOC~~ SOLN
0.0000 [IU] | SUBCUTANEOUS | Status: DC
  Administered 2013-05-07: 1 [IU] via SUBCUTANEOUS
  Administered 2013-05-07 – 2013-05-08 (×4): 2 [IU] via SUBCUTANEOUS
  Administered 2013-05-08 (×2): 1 [IU] via SUBCUTANEOUS
  Administered 2013-05-08 – 2013-05-09 (×3): 2 [IU] via SUBCUTANEOUS
  Administered 2013-05-09: 1 [IU] via SUBCUTANEOUS
  Administered 2013-05-09: 2 [IU] via SUBCUTANEOUS
  Administered 2013-05-09 (×2): 1 [IU] via SUBCUTANEOUS
  Administered 2013-05-10 (×5): 2 [IU] via SUBCUTANEOUS
  Administered 2013-05-10: 16:00:00 3 [IU] via SUBCUTANEOUS
  Administered 2013-05-11: 08:00:00 2 [IU] via SUBCUTANEOUS
  Administered 2013-05-11: 3 [IU] via SUBCUTANEOUS
  Administered 2013-05-11 (×2): 2 [IU] via SUBCUTANEOUS

## 2013-05-06 MED ORDER — PROMETHAZINE HCL 25 MG/ML IJ SOLN: 6.2500 mg | INTRAMUSCULAR | Status: DC | PRN

## 2013-05-06 MED ORDER — FENTANYL CITRATE 0.05 MG/ML IJ SOLN: 25.0000 ug | INTRAMUSCULAR | Status: DC | PRN

## 2013-05-06 MED ORDER — 0.9 % SODIUM CHLORIDE (POUR BTL) OPTIME
TOPICAL | Status: DC | PRN
  Administered 2013-05-06: 1000 mL

## 2013-05-06 MED ORDER — CEFUROXIME SODIUM 1.5 G IJ SOLR
1.5000 g | INTRAMUSCULAR | Status: DC
  Filled 2013-05-06: qty 1.5

## 2013-05-06 SURGICAL SUPPLY — 28 items
BRUSH SCRUB EZ PLAIN DRY (MISCELLANEOUS) ×4 IMPLANT
CANISTER SUCTION 2500CC (MISCELLANEOUS) ×2 IMPLANT
COVER SURGICAL LIGHT HANDLE (MISCELLANEOUS) ×2 IMPLANT
COVER TRANSDUCER ULTRASND GEL (DRAPE) ×2 IMPLANT
DERMABOND ADVANCED (GAUZE/BANDAGES/DRESSINGS) ×1
DERMABOND ADVANCED .7 DNX12 (GAUZE/BANDAGES/DRESSINGS) ×1 IMPLANT
DRAPE C-ARM 42X72 X-RAY (DRAPES) ×2 IMPLANT
DRAPE LAPAROSCOPIC ABDOMINAL (DRAPES) ×2 IMPLANT
DRSG AQUACEL AG ADV 3.5X14 (GAUZE/BANDAGES/DRESSINGS) ×2 IMPLANT
GLOVE BIO SURGEON STRL SZ 6.5 (GLOVE) ×6 IMPLANT
GLOVE BIOGEL PI IND STRL 6.5 (GLOVE) ×1 IMPLANT
GLOVE BIOGEL PI IND STRL 7.0 (GLOVE) ×1 IMPLANT
GLOVE BIOGEL PI INDICATOR 6.5 (GLOVE) ×1
GLOVE BIOGEL PI INDICATOR 7.0 (GLOVE) ×1
GOWN STRL NON-REIN LRG LVL3 (GOWN DISPOSABLE) ×4 IMPLANT
KIT BASIN OR (CUSTOM PROCEDURE TRAY) ×2 IMPLANT
KIT PLEURX DRAIN CATH 1000ML (MISCELLANEOUS) ×2 IMPLANT
KIT PLEURX DRAIN CATH 15.5FR (DRAIN) ×2 IMPLANT
KIT ROOM TURNOVER OR (KITS) ×2 IMPLANT
NS IRRIG 1000ML POUR BTL (IV SOLUTION) ×2 IMPLANT
PACK GENERAL/GYN (CUSTOM PROCEDURE TRAY) ×2 IMPLANT
PAD ARMBOARD 7.5X6 YLW CONV (MISCELLANEOUS) ×4 IMPLANT
SET DRAINAGE LINE (MISCELLANEOUS) IMPLANT
SUT ETHILON 3 0 FSL (SUTURE) ×2 IMPLANT
SUT VIC AB 3-0 X1 27 (SUTURE) ×2 IMPLANT
TOWEL OR 17X24 6PK STRL BLUE (TOWEL DISPOSABLE) ×2 IMPLANT
TOWEL OR 17X26 10 PK STRL BLUE (TOWEL DISPOSABLE) ×2 IMPLANT
WATER STERILE IRR 1000ML POUR (IV SOLUTION) ×2 IMPLANT

## 2013-05-06 NOTE — Plan of Care (Cosign Needed)
CBG decreased to 67 despite dextrose IVF- will dc Lantus in favor of SSI.  Junious Silk, ANP

## 2013-05-06 NOTE — Anesthesia Procedure Notes (Signed)
Procedure Name: MAC Date/Time: 05/06/2013 8:57 AM Performed by: Jerilee Hoh Pre-anesthesia Checklist: Patient identified, Emergency Drugs available, Suction available and Patient being monitored Patient Re-evaluated:Patient Re-evaluated prior to inductionOxygen Delivery Method: Simple face mask Intubation Type: IV induction Placement Confirmation: positive ETCO2 and breath sounds checked- equal and bilateral

## 2013-05-06 NOTE — Progress Notes (Signed)
Hypoglycemic Event  CBG: 66  Treatment: D50 IV 25 mL  Symptoms: None  Follow-up CBG: Time: 0110 CBG Result:133  Possible Reasons for Event: Medication regimen: 24 u Lantus HS  Comments/MD notified:Ellis, NP notified, Lantus d/c'd in favor of SSI    Lael Pilch, Estanislado Emms  Remember to initiate Hypoglycemia Order Set & complete

## 2013-05-06 NOTE — Progress Notes (Signed)
Day of Surgery  Subjective: No real improvement  No recorded drainage, tube has been moved. He got the pleurex drain this Am  Objective: Vital signs in last 24 hours: Temp:  [97.6 F (36.4 C)-98.4 F (36.9 C)] 97.6 F (36.4 C) (09/12 1030) Pulse Rate:  [81-95] 91 (09/12 1030) Resp:  [10-20] 10 (09/12 1030) BP: (108-121)/(61-73) 108/61 mmHg (09/12 1000) SpO2:  [94 %-100 %] 100 % (09/12 1030) Last BM Date: 05/01/13 Afebrile, VSS No labs today. Film shows contrast still in right colon with increased distension Intake/Output from previous day: 09/11 0701 - 09/12 0700 In: 2800 [I.V.:2000; NG/GT:600; IV Piggyback:200] Out: 525 [Urine:525] Intake/Output this shift: Total I/O In: 200 [I.V.:200] Out: 5 [Blood:5]  General appearance: alert, cooperative and no distress GI: soft, still distended he's not tender.  BS are hyper active.  Lab Results:   Recent Labs  05/05/13 0430 05/05/13 1848  WBC 2.1* 2.0*  HGB 10.4* 10.3*  HCT 30.9* 30.4*  PLT 245 266    BMET  Recent Labs  05/05/13 0430 05/05/13 1848  NA 134* 133*  K 3.0* 3.3*  CL 91* 95*  CO2 36* 35*  GLUCOSE 82 92  BUN 29* 18  CREATININE 1.32 0.86  CALCIUM 8.0* 7.8*   PT/INR  Recent Labs  05/03/13 1819 05/05/13 1848  LABPROT 16.7* 18.7*  INR 1.39 1.61*     Recent Labs Lab 05/05/13 0430 05/05/13 1848  AST 17 14  ALT 12 11  ALKPHOS 176* 162*  BILITOT 0.6 0.5  PROT 5.8* 5.6*  ALBUMIN 2.0* 1.9*     Lipase  No results found for this basename: lipase     Studies/Results: Dg Abd 1 View  05/06/2013   CLINICAL DATA:  Small bowel obstruction, abdominal discomfort, history hypertension, diabetes  EXAM: ABDOMEN - 1 VIEW  COMPARISON:  05/05/2013  FINDINGS: Nasogastric tube projects over expected position of the duodenal C loop, tip approximately at ligament of Treitz.  Retained contrast in right colon.  Increased small bowel distention compatible with persistent small bowel obstruction.  No definite bowel  wall thickening.  Bones demineralized.  Right pelvic phlebolith stable.  IMPRESSION: Persistent small bowel obstruction with increased distention of small bowel loops in the upper abdomen.   Electronically Signed   By: Ulyses Southward M.D.   On: 05/06/2013 08:03   Ct Chest W Contrast  05/04/2013   CLINICAL DATA:  Pancreatic carcinoma diagnosed 2011. Chemotherapy and radiation therapy completed. Small bowel obstruction.  EXAM: CT CHEST, ABDOMEN, AND PELVIS WITH CONTRAST  TECHNIQUE: Multidetector CT imaging of the chest, abdomen and pelvis was performed following the standard protocol during bolus administration of intravenous contrast.  CONTRAST:  OMNIPAQUE IOHEXOL 300 MG/ML  SOLN  COMPARISON:  CT 03/11/2013, plain films 05/03/2013  FINDINGS: CT CHEST FINDINGS  Port in the right anterior chest wall. No axillary or supraclavicular lymphadenopathy. No mediastinal or hilar lymphadenopathy. No pericardial fluid. There is a moderate-sized right pleural effusion which is increased in size compared to prior. There is passive atelectasis of the right middle lobe and right lower lobe. No obstructing lesion is identified. The lung parenchyma demonstrates mild ground-glass opacities in the right lobe adjacent to the effusion which is likely atelectasis. There is some peripheral ground-glass nodules in the left upper lobe which are new from prior (image 13). It likely infectious or inflammatory.  In the medial aspect of the right upper lobe there is nodular pleural thickening to 7 mm which is increased from prior. Additional 5  mm nodule in medial aspect of the right middle lobe is increased from prior.  CT ABDOMEN AND PELVIS FINDINGS  Again demonstrated a low-density lesion lateral aspect of the left hepatic lobe measuring 3.3 x 4.3 cm unchanged from prior. No new hepatic lesions. In pneumobilia in the nondependent left hepatic lobe. There is a NG tube extending into the gastric antrum. Patient status post Whipple  procedure. No new periportal adenopathy. There is a nodule along the ventral peritoneal surface inferior to the greater curvature of stomach measuring 7 mm (image 66) which is unchanged from prior. Within the right lower abdomen there is a 12 mm nodule within the mesenteries (image 96) which is similar to 10 mm on prior but slightly increased. Small nodule within the central mesentery measuring 8 mm (image 79) increased from 5 mm on prior  The stomach is normal. Dilated loops of small bowel up to 3.5 cm in the left abdomen. The distal small bowel is reduced caliber. Contrast does flow into the distal small bowel albeit a small amount. Contrast does not reach the terminal ileum. There is a caliber change between the proximal small bowel and distal small bowel. No clear transition point is identified but most likely in the mid central abdomen. No evidence of intraperitoneal free air or pneumatosis. No portal venous gas.  The bladder is distended. Prostate gland is normal. No pelvic lymphadenopathy.  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  Interval increase in pleural fluid and atelectasis in the right hemi thorax.  New ground-glass regions in the left upper lobe suggests inflammatory infectious process.  CT ABDOMEN AND PELVIS IMPRESSION  Partial mechanical small bowel obstruction with a transition point likely in the mid central abdomen. As there are several peritoneal metastases, this could be the source of the partial obstruction.  Stable hepatic metastasis in the left hepatic lobe.  No evidence of local pancreatic cancer recurrence.  Mild increase in size of several peritoneal metastasis.   Electronically Signed   By: Genevive Bi M.D.   On: 05/04/2013 17:47   Ct Abdomen Pelvis W Contrast  05/04/2013   CLINICAL DATA:  Pancreatic carcinoma diagnosed 2011. Chemotherapy and  radiation therapy completed. Small bowel obstruction.  EXAM: CT CHEST, ABDOMEN, AND PELVIS WITH CONTRAST  TECHNIQUE: Multidetector CT imaging of the chest, abdomen and pelvis was performed following the standard protocol during bolus administration of intravenous contrast.  CONTRAST:  OMNIPAQUE IOHEXOL 300 MG/ML  SOLN  COMPARISON:  CT 03/11/2013, plain films 05/03/2013  FINDINGS: CT CHEST FINDINGS  Port in the right anterior chest wall. No axillary or supraclavicular lymphadenopathy. No mediastinal or hilar lymphadenopathy. No pericardial fluid. There is a moderate-sized right pleural effusion which is increased in size compared to prior. There is passive atelectasis of the right middle lobe and right lower lobe. No obstructing lesion is identified. The lung parenchyma demonstrates mild ground-glass opacities in the right lobe adjacent to the effusion which is likely atelectasis. There is some peripheral ground-glass nodules in the left upper lobe which are new from prior (image 13). It likely infectious or inflammatory.  In the medial aspect of the right upper lobe there is nodular pleural thickening to 7 mm which is increased from prior. Additional 5 mm nodule in medial aspect of the right middle lobe is increased from prior.  CT ABDOMEN AND PELVIS FINDINGS  Again demonstrated a  low-density lesion lateral aspect of the left hepatic lobe measuring 3.3 x 4.3 cm unchanged from prior. No new hepatic lesions. In pneumobilia in the nondependent left hepatic lobe. There is a NG tube extending into the gastric antrum. Patient status post Whipple procedure. No new periportal adenopathy. There is a nodule along the ventral peritoneal surface inferior to the greater curvature of stomach measuring 7 mm (image 66) which is unchanged from prior. Within the right lower abdomen there is a 12 mm nodule within the mesenteries (image 96) which is similar to 10 mm on prior but slightly increased. Small nodule within the central  mesentery measuring 8 mm (image 79) increased from 5 mm on prior  The stomach is normal. Dilated loops of small bowel up to 3.5 cm in the left abdomen. The distal small bowel is reduced caliber. Contrast does flow into the distal small bowel albeit a small amount. Contrast does not reach the terminal ileum. There is a caliber change between the proximal small bowel and distal small bowel. No clear transition point is identified but most likely in the mid central abdomen. No evidence of intraperitoneal free air or pneumatosis. No portal venous gas.  The bladder is distended. Prostate gland is normal. No pelvic lymphadenopathy.  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  Interval increase in pleural fluid and atelectasis in the right hemi thorax.  New ground-glass regions in the left upper lobe suggests inflammatory infectious process.  CT ABDOMEN AND PELVIS IMPRESSION  Partial mechanical small bowel obstruction with a transition point likely in the mid central abdomen. As there are several peritoneal metastases, this could be the source of the partial obstruction.  Stable hepatic metastasis in the left hepatic lobe.  No evidence of local pancreatic cancer recurrence.  Mild increase in size of several peritoneal metastasis.   Electronically Signed   By: Genevive Bi M.D.   On: 05/04/2013 17:47   Dg Chest Port 1 View  05/06/2013   *RADIOLOGY REPORT*  Clinical Data: Right chest tube placement.  PORTABLE CHEST - 1 VIEW  Comparison: 05/05/2013.  Findings: Placement of right chest tube with the tip projecting over the right lung apex.  Small (5%) right apical pneumothorax.  Right-sided pleural effusion appears minimally smaller.  Underlying infiltrate or mass not excluded.  Nasogastric tube courses below the diaphragm.  The tip is not included on this exam.  Cardiomegaly.  Pulmonary  vascular prominence.  Right Mediport catheter tip proximal superior vena cava level.  Calcified tortuous aorta.  IMPRESSION: Placement of right chest tube with the tip projecting over the right lung apex.  Small (5%) right apical pneumothorax.  Right-sided pleural effusion appears minimally smaller.  Please see above.  This is a call report.   Original Report Authenticated By: Lacy Duverney, M.D.   Dg Chest Port 1 View  05/05/2013   CLINICAL DATA:  Assess pleural effusion, history hypertension, diabetes, pancreatic cancer  EXAM: PORTABLE CHEST - 1 VIEW  COMPARISON:  Portable exam 0510 hr compared to 05/04/2013  FINDINGS: Right jugular Port-A-Cath stable tip projecting over SVC.  Nasogastric tube extends into stomach.  Minimal enlargement of cardiac silhouette.  Mediastinal contours normal.  Large right pleural effusion with significant atelectasis of the right lung.  Underlying infiltrate and mass not excluded with this appearance.  Minimal pulmonary vascular congestion with left lung clear.  No pneumothorax.  IMPRESSION:  Persistent large right pleural effusion with significant compressive atelectasis of the right lung.   Electronically Signed   By: Ulyses Southward M.D.   On: 05/05/2013 08:02   Dg Chest Port 1 View  05/04/2013   ADDENDUM REPORT: 05/04/2013 16:37  ADDENDUM: Upon further review and consultation with the ordering physician, no endotracheal tube is present on the study. Initial dictation by Dr. Reche Dixon and addendum by Dr. Amil Amen   Electronically Signed   By: Genevive Bi M.D.   On: 05/04/2013 16:37   05/04/2013   CLINICAL DATA:  Evaluate pleural effusion. Shortness of Breath. Weakness.  EXAM: PORTABLE CHEST - 1 VIEW  COMPARISON:  None 914  FINDINGS: Endotracheal tube terminates 4.8 cm above Carina. Nasogastric tube extends beyond the inferior aspect of the film.  Right internal jugular line terminates at mid SVC.  The chin overlies the apices. Cardiomegaly, exaggerated by AP portable technique.  Right sided pleural effusion is moderate and not significantly changed. The left pleural effusion. No significant change in airspace disease within the right mid and lower lung. Left lung remains clear.  IMPRESSION: No significant change in moderate right-sided pleural effusion and adjacent airspace opacity.  Interval placement of endotracheal tube, terminating 4.8 cm above Carina.  Electronically Signed: By: Jeronimo Greaves On: 05/04/2013 14:32   Dg Abd Portable 1v  05/05/2013   CLINICAL DATA:  Small bowel obstruction  EXAM: PORTABLE ABDOMEN - 1 VIEW  COMPARISON:  05/03/2013  Correlation: CT abdomen and pelvis 05/04/2013  FINDINGS: Nasogastric tube in stomach.  Oral contrast administered from prior CT is identified within the distal small bowel and colon.  However persistent dilatation of proximal small bowel loops in left abdomen compatible with persistent small bowel obstruction.  Excreted contrast within urinary bladder and renal collecting systems, with right renal collecting system slightly prominent as noted on CT.  No definite bowel wall thickening.  IMPRESSION: Persistent small bowel obstruction.   Electronically Signed   By: Ulyses Southward M.D.   On: 05/05/2013 08:07    Medications: . dextrose      . enoxaparin (LOVENOX) injection  40 mg Subcutaneous Q24H  . famotidine (PEPCID) IV  20 mg Intravenous Q12H  . insulin aspart  0-9 Units Subcutaneous Q4H  . sodium chloride  3 mL Intravenous Q12H    Assessment/Plan 1. SBO with pancreatic cancer Stage IIIB T3 N1M0, s/p Distal pancreectomy and spleenectomy 06/2010.  2. On going Chemotherapy  3.AODM s/p pancreatectomy  4.Hypertension  5. Hypokalemia  6. Large right Pleural effusion     S/p INSERTION PLEURAL DRAINAGE CATHETER(Right) With Ultrasound guidance and Fluro, 05/06/13.  PLEURAL FLUID, RIGHT from pleurodesis at admission: MALIGNANT CELLS CONSISTENT WITH METASTATIC ADENOCARCINOMA. COMMENT: THE CLINICAL IMPRESSION OF A PANCREATIC PRIMARY IS  NOTED. THE ADENOCARCINOMA DEMONSTRATES NEGATIVE TTF1 EXPRESSION AND POSITIVE CDX2 EXPRESSION. CDX2 MAY BE EXPRESSED IN PANCREATIC ADENOCARCINOMA. THE CASE WAS DISCUSSED WITH DR Vassie Loll ON 05/05/2013.   Plan  :  Continue NG drainage, daughter says he's not been able to take nutrition for almost a week.  May need to consider TNA, will discuss with Dr. Jamey Ripa and Ardyth Harps.   LOS: 3 days    Philip Shaw 05/06/2013

## 2013-05-06 NOTE — Anesthesia Postprocedure Evaluation (Signed)
  Anesthesia Post-op Note  Patient: Philip Shaw [facility]  Procedure(s) Performed: Procedure(s): INSERTION PLEURAL DRAINAGE CATHETER (Right)  Patient Location: PACU  Anesthesia Type:MAC  Level of Consciousness: awake  Airway and Oxygen Therapy: Patient Spontanous Breathing  Post-op Pain: none  Post-op Assessment: Post-op Vital signs reviewed  Post-op Vital Signs: Reviewed and stable  Complications: No apparent anesthesia complications

## 2013-05-06 NOTE — Anesthesia Preprocedure Evaluation (Addendum)
Anesthesia Evaluation  Patient identified by MRN, date of birth, ID band Patient awake    Reviewed: Allergy & Precautions, NPO status , Patient's Chart, lab work & pertinent test results  Airway Mallampati: I  Neck ROM: Full    Dental  (+) Teeth Intact   Pulmonary sleep apnea ,  Pleural effusion + rhonchi         Cardiovascular hypertension, + Valvular Problems/Murmurs Rhythm:Regular Rate:Normal     Neuro/Psych    GI/Hepatic Pancreatic ca   Endo/Other  diabetes  Renal/GU      Musculoskeletal   Abdominal   Peds  Hematology   Anesthesia Other Findings   Reproductive/Obstetrics                          Anesthesia Physical Anesthesia Plan  ASA: IV  Anesthesia Plan: MAC   Post-op Pain Management:    Induction: Intravenous  Airway Management Planned: Natural Airway and Simple Face Mask  Additional Equipment:   Intra-op Plan:   Post-operative Plan:   Informed Consent: I have reviewed the patients History and Physical, chart, labs and discussed the procedure including the risks, benefits and alternatives for the proposed anesthesia with the patient or authorized representative who has indicated his/her understanding and acceptance.     Plan Discussed with: CRNA and Surgeon  Anesthesia Plan Comments:        Anesthesia Quick Evaluation

## 2013-05-06 NOTE — Transfer of Care (Signed)
Immediate Anesthesia Transfer of Care Note  Patient: Philip Shaw [facility]  Procedure(s) Performed: Procedure(s): INSERTION PLEURAL DRAINAGE CATHETER (Right)  Patient Location: PACU  Anesthesia Type:MAC  Level of Consciousness: awake, alert , oriented and patient cooperative  Airway & Oxygen Therapy: Patient Spontanous Breathing and Patient connected to nasal cannula oxygen  Post-op Assessment: Report given to PACU RN, Post -op Vital signs reviewed and stable and Patient moving all extremities  Post vital signs: Reviewed and stable  Complications: No apparent anesthesia complications

## 2013-05-06 NOTE — Preoperative (Signed)
Beta Blockers   Reason not to administer Beta Blockers:Not Applicable 

## 2013-05-06 NOTE — Progress Notes (Signed)
[facility] PROGRESS NOTE  Philip Shaw RUE:[id number] DOB: [birth year] DOA: 05/03/2013 PCP: [name], MD  Assessment/Plan: SBO -Likely from progression of cancer. -Appreciate surgical input. -Continue NG decompression. -Agree that he would be a poor surgical candidate if needed for his SBO, but what other option will he have if he fails conservative measures?  Large Exudative Right Pleural Effusion -S/p large volume thoracentesis on 9/9 of 2 L. -S/p pleurex catheter placement on 05/06/13. -Pleural fluid with malignant cells. -Do not feel that clinically or by imaging he has PNA, so will DC vanc/Cefepime.  Metastatic Pancreatic Cancer -Pleural fluid with malignant cells, CT with pleural and peritoneal nodules concerning for metastatic disease. -As per oncology.  AOCD -He is now s/p transfusion of 2 units of PRBCs with a Hb of 10.4.  Hypokalemia -From NG suction. -Replete IV.  Code Status: Full Code Family Communication: Wife and daughter at bedside updated on plan of care.  Disposition Plan: Home when medically ready.    Consultants:  Surgery  Pulmonary   Antibiotics:  None  Subjective: Feels much better today, Less SOB/nausea and decreased abdominal pain. In good spirits: multiple family members and friends at bedside.  Objective: Filed Vitals:   05/06/13 1000 05/06/13 1015 05/06/13 1030 05/06/13 1339  BP: 108/61   115/71  Pulse: 93 92 91 96  Temp:   97.6 F (36.4 C) 97.9 F (36.6 C)  TempSrc:    Oral  Resp: 12 15 10 16   Height:      Weight:      SpO2: 100% 94% 100% 100%    Intake/Output Summary (Last 24 hours) at 05/06/13 1647 Last data filed at 05/06/13 1618  Gross per 24 hour  Intake   2050 ml  Output    855 ml  Net   1195 ml   Filed Weights   05/03/13 1538 05/03/13 1841  Weight: 69.6 kg (153 lb 7 oz) 65.4 kg (144 lb 2.9 oz)    Exam:   General:  AA Ox3  Cardiovascular: RRR, no M/R/G  Respiratory: decreased BS right  base  Abdomen: S/ND/+BS  Extremities: 1+edema   Neurologic:  Non-focal.  Data Reviewed: Basic Metabolic Panel:  Recent Labs Lab 05/03/13 1230 05/04/13 0455 05/05/13 0430 05/05/13 1848  NA 131* 131* 134* 133*  K 3.9 3.5 3.0* 3.3*  CL 83* 89* 91* 95*  CO2 35* 36* 36* 35*  GLUCOSE 115* 167* 82 92  BUN 30* 36* 29* 18  CREATININE 0.99 1.26 1.32 0.86  CALCIUM 9.5 8.3* 8.0* 7.8*   Liver Function Tests:  Recent Labs Lab 05/05/13 0430 05/05/13 1848  AST 17 14  ALT 12 11  ALKPHOS 176* 162*  BILITOT 0.6 0.5  PROT 5.8* 5.6*  ALBUMIN 2.0* 1.9*   No results found for this basename: LIPASE, AMYLASE,  in the last 168 hours No results found for this basename: AMMONIA,  in the last 168 hours CBC:  Recent Labs Lab 05/03/13 1230 05/04/13 0455 05/05/13 0430 05/05/13 1848  WBC 1.5*  1.5* 2.4* 2.1* 2.0*  NEUTROABS 0.9*  --   --   --   HGB 9.2*  9.3* 7.9* 10.4* 10.3*  HCT 27.7*  28.0* 24.2* 30.9* 30.4*  MCV 87.9  87.8 88.6 88.5 87.1  PLT 307  310 261 245 266   Cardiac Enzymes: No results found for this basename: CKTOTAL, CKMB, CKMBINDEX, TROPONINI,  in the last 168 hours BNP (last 3 results) No results found for this basename: PROBNP,  in the  last 8760 hours CBG:  Recent Labs Lab 05/06/13 0450 05/06/13 0947 05/06/13 1015 05/06/13 1137 05/06/13 1557  GLUCAP 72 59* 102* 76 89    Recent Results (from the past 240 hour(s))  BODY FLUID CULTURE     Status: None   Collection Time    05/03/13  7:01 PM      Result Value Range Status   Specimen Description THORACENTESIS   Final   Special Requests Immunocompromised   Final   Gram Stain     Final   Value: WBC PRESENT,BOTH PMN AND MONONUCLEAR     NO ORGANISMS SEEN     Performed by [facility]     Performed at [facility]   Culture     Final   Value: NO GROWTH 2 DAYS     Performed at [facility]   Report Status PENDING   Incomplete  GRAM STAIN     Status: None   Collection Time     05/03/13  7:02 PM      Result Value Range Status   Specimen Description THORACENTESIS   Final   Special Requests Immunocompromised   Final   Gram Stain     Final   Value: CYTOSPIN     NO ORGANISMS SEEN     WBC PRESENT,BOTH PMN AND MONONUCLEAR     Gram Stain Report Called to,Read Back By and Verified With: S YOUNG RN 2048 05/03/13 A NAVARRO   Report Status 05/03/2013 FINAL   Final  SURGICAL PCR SCREEN     Status: None   Collection Time    05/05/13  9:48 PM      Result Value Range Status   MRSA, PCR NEGATIVE  NEGATIVE Final   Staphylococcus aureus NEGATIVE  NEGATIVE Final   Comment:            The Xpert SA Assay (FDA     approved for NASAL specimens     in patients over 102 years of age),     is one component of     a comprehensive surveillance     program.  Test performance has     been validated by [employer] for patients greater     than or equal to 30 year old.     It is not intended     to diagnose infection nor to     guide or monitor treatment.     Studies: Dg Abd 1 View  05/06/2013   CLINICAL DATA:  Small bowel obstruction, abdominal discomfort, history hypertension, diabetes  EXAM: ABDOMEN - 1 VIEW  COMPARISON:  05/05/2013  FINDINGS: Nasogastric tube projects over expected position of the duodenal C loop, tip approximately at ligament of Treitz.  Retained contrast in right colon.  Increased small bowel distention compatible with persistent small bowel obstruction.  No definite bowel wall thickening.  Bones demineralized.  Right pelvic phlebolith stable.  IMPRESSION: Persistent small bowel obstruction with increased distention of small bowel loops in the upper abdomen.   Electronically Signed   By: Ulyses Southward M.D.   On: 05/06/2013 08:03   Dg Chest Port 1 View  05/06/2013   *RADIOLOGY REPORT*  Clinical Data: Right chest tube placement.  PORTABLE CHEST - 1 VIEW  Comparison: 05/05/2013.  Findings: Placement of right chest tube with the tip projecting over the right lung  apex.  Small (5%) right apical pneumothorax.  Right-sided pleural effusion appears minimally smaller.  Underlying infiltrate or  mass not excluded.  Nasogastric tube courses below the diaphragm.  The tip is not included on this exam.  Cardiomegaly.  Pulmonary vascular prominence.  Right Mediport catheter tip proximal superior vena cava level.  Calcified tortuous aorta.  IMPRESSION: Placement of right chest tube with the tip projecting over the right lung apex.  Small (5%) right apical pneumothorax.  Right-sided pleural effusion appears minimally smaller.  Please see above.  This is a call report.   Original Report Authenticated By: Lacy Duverney, M.D.   Dg Chest Port 1 View  05/05/2013   CLINICAL DATA:  Assess pleural effusion, history hypertension, diabetes, pancreatic cancer  EXAM: PORTABLE CHEST - 1 VIEW  COMPARISON:  Portable exam 0510 hr compared to 05/04/2013  FINDINGS: Right jugular Port-A-Cath stable tip projecting over SVC.  Nasogastric tube extends into stomach.  Minimal enlargement of cardiac silhouette.  Mediastinal contours normal.  Large right pleural effusion with significant atelectasis of the right lung.  Underlying infiltrate and mass not excluded with this appearance.  Minimal pulmonary vascular congestion with left lung clear.  No pneumothorax.  IMPRESSION: Persistent large right pleural effusion with significant compressive atelectasis of the right lung.   Electronically Signed   By: Ulyses Southward M.D.   On: 05/05/2013 08:02   Dg Abd Portable 1v  05/05/2013   CLINICAL DATA:  Small bowel obstruction  EXAM: PORTABLE ABDOMEN - 1 VIEW  COMPARISON:  05/03/2013  Correlation: CT abdomen and pelvis 05/04/2013  FINDINGS: Nasogastric tube in stomach.  Oral contrast administered from prior CT is identified within the distal small bowel and colon.  However persistent dilatation of proximal small bowel loops in left abdomen compatible with persistent small bowel obstruction.  Excreted contrast within  urinary bladder and renal collecting systems, with right renal collecting system slightly prominent as noted on CT.  No definite bowel wall thickening.  IMPRESSION: Persistent small bowel obstruction.   Electronically Signed   By: Ulyses Southward M.D.   On: 05/05/2013 08:07   Dg C-arm 1-60 Min-no Report  05/06/2013   CLINICAL DATA: Pleurex catheter insertion; right chest; Maloignant pleural  effusion   C-ARM 1-60 MINUTES  Fluoroscopy was utilized by the requesting physician.  No radiographic  interpretation.     Scheduled Meds: . dextrose      . enoxaparin (LOVENOX) injection  40 mg Subcutaneous Q24H  . famotidine (PEPCID) IV  20 mg Intravenous Q12H  . insulin aspart  0-9 Units Subcutaneous Q4H  . sodium chloride  3 mL Intravenous Q12H   Continuous Infusions: . dextrose 5 % and 0.9% NaCl 100 mL/hr at 05/06/13 1142  . lactated ringers      Principal Problem:   SBO (small bowel obstruction) Active Problems:   Malignant pleural effusion   Malnutrition of moderate degree   Hypokalemia   Anemia, chronic disease    Time spent: 35 minutes.    Chaya Jan  [facility] Pager [phone]  If 7PM-7AM, please contact night-coverage at [email or web address], password [facility] 05/06/2013, 4:47 PM  LOS: 3 days

## 2013-05-06 NOTE — Progress Notes (Signed)
Agree with A&P of WJ,PA. Has now passed gas several times and feels he may have a BM. Still bile out NG, but it has been in duodenum Discussed situation with him and family. He may get lucky and open up, but may not. Decision will then need to be made re surgery (maybe a scope to assess situation.) His respiratory status is better since chest tube and he overall seems improved

## 2013-05-06 NOTE — Brief Op Note (Signed)
      [address]       [place] [address]             [phone]      05/03/2013 - 05/06/2013  9:52 AM  PATIENT:  Audie Pinto  78 y.o. male  PRE-OPERATIVE DIAGNOSIS:  RIGHT MALIGNANT PLEURAL EFFUSION  POST-OPERATIVE DIAGNOSIS:  RIGHT MALIGNANT PLEURAL EFFUSION  PROCEDURE:  Procedure(s): INSERTION PLEURAL DRAINAGE CATHETER (Right) With Ultrasound guidance and Fluro  SURGEON:  Surgeon(s) and Role:    * Delight Ovens, MD - Primary  PHYSICIAN ASSISTANT:   ASSISTANTS: none   ANESTHESIA:   MAC  EBL:  Total I/O In: 200 [I.V.:200] Out: 5 [Blood:5]  BLOOD ADMINISTERED:none  DRAINS: rt pleurix cath  LOCAL MEDICATIONS USED:  LIDOCAINE  and Amount: 15 ml  SPECIMEN:  No Specimen 1000 ml drained  DISPOSITION OF SPECIMEN:  N/A  COUNTS:  YES   DICTATION: .Dragon Dictation  PLAN OF CARE: transfer back to [facility]  PATIENT DISPOSITION:  PACU - hemodynamically stable.   Delay start of Pharmacological VTE agent (>24hrs) due to surgical blood loss or risk of bleeding: yes

## 2013-05-07 DIAGNOSIS — E440 Moderate protein-calorie malnutrition: Secondary | ICD-10-CM

## 2013-05-07 LAB — CBC
HCT: 31.8 % — ABNORMAL LOW (ref 39.0–52.0)
Hemoglobin: 10.3 g/dL — ABNORMAL LOW (ref 13.0–17.0)
MCH: 29.7 pg (ref 26.0–34.0)
MCHC: 32.4 g/dL (ref 30.0–36.0)
MCV: 91.6 fL (ref 78.0–100.0)
Platelets: 227 10*3/uL (ref 150–400)
RBC: 3.47 MIL/uL — ABNORMAL LOW (ref 4.22–5.81)
RDW: 17.7 % — ABNORMAL HIGH (ref 11.5–15.5)
WBC: 1.9 10*3/uL — ABNORMAL LOW (ref 4.0–10.5)

## 2013-05-07 LAB — MAGNESIUM: Magnesium: 2.1 mg/dL (ref 1.5–2.5)

## 2013-05-07 LAB — GLUCOSE, CAPILLARY: Glucose-Capillary: 129 mg/dL — ABNORMAL HIGH (ref 70–99)

## 2013-05-07 LAB — BASIC METABOLIC PANEL
BUN: 9 mg/dL (ref 6–23)
CO2: 29 mEq/L (ref 19–32)
Calcium: 7.9 mg/dL — ABNORMAL LOW (ref 8.4–10.5)
Chloride: 101 mEq/L (ref 96–112)
Creatinine, Ser: 0.57 mg/dL (ref 0.50–1.35)
GFR calc Af Amer: >90 mL/min (ref >90)
GFR calc non Af Amer: >90 mL/min (ref >90)
Glucose, Bld: 159 mg/dL — ABNORMAL HIGH (ref 70–99)
Potassium: 3.5 mEq/L (ref 3.5–5.1)
Sodium: 134 mEq/L — ABNORMAL LOW (ref 135–145)

## 2013-05-07 LAB — BODY FLUID CULTURE

## 2013-05-07 MED ORDER — DEXTROSE 50 % IV SOLN
25.0000 mL | Freq: Once | INTRAVENOUS | Status: AC | PRN
  Administered 2013-05-07: 25 mL via INTRAVENOUS

## 2013-05-07 MED ORDER — SODIUM CHLORIDE 0.9 % IV SOLN: INTRAVENOUS | Status: AC

## 2013-05-07 MED ORDER — FAT EMULSION 20 % IV EMUL
250.0000 mL | INTRAVENOUS | Status: AC
  Administered 2013-05-07: 18:00:00 250 mL via INTRAVENOUS
  Filled 2013-05-07: qty 250

## 2013-05-07 MED ORDER — POTASSIUM PHOSPHATE DIBASIC 3 MMOLE/ML IV SOLN
12.0000 mmol | Freq: Once | INTRAVENOUS | Status: AC
  Administered 2013-05-07: 15:00:00 12 mmol via INTRAVENOUS
  Filled 2013-05-07: qty 4

## 2013-05-07 MED ORDER — TRACE MINERALS CR-CU-F-FE-I-MN-MO-SE-ZN IV SOLN
INTRAVENOUS | Status: AC
  Administered 2013-05-07: 18:00:00 via INTRAVENOUS
  Filled 2013-05-07: qty 1000

## 2013-05-07 NOTE — Progress Notes (Signed)
Around 20:30 the pt passed a hard formed renal stone, when voiding amber urine, no blood visible. The pt stated the he felt it when it passed. He had been having lower back pain & was just medicated around 19:48 for that back discomfort, but denies any back pain at this time. The pt stated that he has had renal stones 5 other times in his life. The stone strained out of urine, & placed in a collection cup in pt's bathroom.

## 2013-05-07 NOTE — Progress Notes (Signed)
[facility] PROGRESS NOTE  Philip Shaw ZOX:[id number] DOB: [birth year] DOA: 05/03/2013 PCP: [name], MD  Assessment/Plan: SBO -Likely from progression of cancer. -Appreciate surgical input. -Continue NG decompression. -Agree that he would be a poor surgical candidate if needed for his SBO, but what other option will he have if he fails conservative measures? -Has been passing some flatus and had a small BM earlier today. -Will start TNA as he has been without nutrition for about 6 days.  Large Exudative Right Pleural Effusion -S/p large volume thoracentesis on 9/9 of 2 L. -S/p pleurex catheter placement on 05/06/13. -Pleural fluid with malignant cells. -Do not feel that clinically or by imaging he has PNA, so will DC vanc/Cefepime.  Metastatic Pancreatic Cancer -Pleural fluid with malignant cells, CT with pleural and peritoneal nodules concerning for metastatic disease. -As per oncology.  AOCD -He is now s/p transfusion of 2 units of PRBCs with a Hb of 10.4.  Hypokalemia -From NG suction. -Repleted. -Recheck in am.  Code Status: Full Code Family Communication: Wife and daughter at bedside updated on plan of care.  Disposition Plan: Home when medically ready.    Consultants:  Surgery  Pulmonary   Antibiotics:  None  Subjective: Feels much better today, Less SOB/nausea and decreased abdominal pain. In good spirits.  Objective: Filed Vitals:   05/06/13 1030 05/06/13 1339 05/06/13 2031 05/07/13 0546  BP:  115/71 126/68 108/64  Pulse: 91 96 88 86  Temp: 97.6 F (36.4 C) 97.9 F (36.6 C) 98.2 F (36.8 C) 98 F (36.7 C)  TempSrc:  Oral Oral Oral  Resp: 10 16 16 16   Height:      Weight:      SpO2: 100% 100% 100% 98%    Intake/Output Summary (Last 24 hours) at 05/07/13 1239 Last data filed at 05/07/13 0900  Gross per 24 hour  Intake   1995 ml  Output   1075 ml  Net    920 ml   Filed Weights   05/03/13 1538 05/03/13 1841  Weight: 69.6  kg (153 lb 7 oz) 65.4 kg (144 lb 2.9 oz)    Exam:   General:  AA Ox3  Cardiovascular: RRR, no M/R/G  Respiratory: decreased BS right base  Abdomen: S/ND/+BS  Extremities: 1+edema   Neurologic:  Non-focal.  Data Reviewed: Basic Metabolic Panel:  Recent Labs Lab 05/03/13 1230 05/04/13 0455 05/05/13 0430 05/05/13 1848 05/07/13 0550  NA 131* 131* 134* 133* 134*  K 3.9 3.5 3.0* 3.3* 3.5  CL 83* 89* 91* 95* 101  CO2 35* 36* 36* 35* 29  GLUCOSE 115* 167* 82 92 159*  BUN 30* 36* 29* 18 9  CREATININE 0.99 1.26 1.32 0.86 0.57  CALCIUM 9.5 8.3* 8.0* 7.8* 7.9*   Liver Function Tests:  Recent Labs Lab 05/05/13 0430 05/05/13 1848  AST 17 14  ALT 12 11  ALKPHOS 176* 162*  BILITOT 0.6 0.5  PROT 5.8* 5.6*  ALBUMIN 2.0* 1.9*   No results found for this basename: LIPASE, AMYLASE,  in the last 168 hours No results found for this basename: AMMONIA,  in the last 168 hours CBC:  Recent Labs Lab 05/03/13 1230 05/04/13 0455 05/05/13 0430 05/05/13 1848 05/07/13 0550  WBC 1.5*  1.5* 2.4* 2.1* 2.0* 1.9*  NEUTROABS 0.9*  --   --   --   --   HGB 9.2*  9.3* 7.9* 10.4* 10.3* 10.3*  HCT 27.7*  28.0* 24.2* 30.9* 30.4* 31.8*  MCV 87.9  87.8 88.6  88.5 87.1 91.6  PLT 307  310 261 245 266 227   Cardiac Enzymes: No results found for this basename: CKTOTAL, CKMB, CKMBINDEX, TROPONINI,  in the last 168 hours BNP (last 3 results) No results found for this basename: PROBNP,  in the last 8760 hours CBG:  Recent Labs Lab 05/06/13 2351 05/07/13 0052 05/07/13 0351 05/07/13 0822 05/07/13 1129  GLUCAP 64* 134* 115* 119* 129*    Recent Results (from the past 240 hour(s))  BODY FLUID CULTURE     Status: None   Collection Time    05/03/13  7:01 PM      Result Value Range Status   Specimen Description THORACENTESIS   Final   Special Requests Immunocompromised   Final   Gram Stain     Final   Value: WBC PRESENT,BOTH PMN AND MONONUCLEAR     NO ORGANISMS SEEN     Performed  by [facility]     Performed at [facility]   Culture     Final   Value: NO GROWTH 3 DAYS     Performed at [facility]   Report Status 05/07/2013 FINAL   Final  GRAM STAIN     Status: None   Collection Time    05/03/13  7:02 PM      Result Value Range Status   Specimen Description THORACENTESIS   Final   Special Requests Immunocompromised   Final   Gram Stain     Final   Value: CYTOSPIN     NO ORGANISMS SEEN     WBC PRESENT,BOTH PMN AND MONONUCLEAR     Gram Stain Report Called to,Read Back By and Verified With: S YOUNG RN 2048 05/03/13 A NAVARRO   Report Status 05/03/2013 FINAL   Final  SURGICAL PCR SCREEN     Status: None   Collection Time    05/05/13  9:48 PM      Result Value Range Status   MRSA, PCR NEGATIVE  NEGATIVE Final   Staphylococcus aureus NEGATIVE  NEGATIVE Final   Comment:            The Xpert SA Assay (FDA     approved for NASAL specimens     in patients over 66 years of age),     is one component of     a comprehensive surveillance     program.  Test performance has     been validated by [employer] for patients greater     than or equal to 16 year old.     It is not intended     to diagnose infection nor to     guide or monitor treatment.     Studies: Dg Abd 1 View  05/06/2013   CLINICAL DATA:  Small bowel obstruction, abdominal discomfort, history hypertension, diabetes  EXAM: ABDOMEN - 1 VIEW  COMPARISON:  05/05/2013  FINDINGS: Nasogastric tube projects over expected position of the duodenal C loop, tip approximately at ligament of Treitz.  Retained contrast in right colon.  Increased small bowel distention compatible with persistent small bowel obstruction.  No definite bowel wall thickening.  Bones demineralized.  Right pelvic phlebolith stable.  IMPRESSION: Persistent small bowel obstruction with increased distention of small bowel loops in the upper abdomen.   Electronically Signed   By: Ulyses Southward M.D.   On: 05/06/2013  08:03   Dg Chest Port 1 View  05/06/2013   *RADIOLOGY REPORT*  Clinical  Data: Right chest tube placement.  PORTABLE CHEST - 1 VIEW  Comparison: 05/05/2013.  Findings: Placement of right chest tube with the tip projecting over the right lung apex.  Small (5%) right apical pneumothorax.  Right-sided pleural effusion appears minimally smaller.  Underlying infiltrate or mass not excluded.  Nasogastric tube courses below the diaphragm.  The tip is not included on this exam.  Cardiomegaly.  Pulmonary vascular prominence.  Right Mediport catheter tip proximal superior vena cava level.  Calcified tortuous aorta.  IMPRESSION: Placement of right chest tube with the tip projecting over the right lung apex.  Small (5%) right apical pneumothorax.  Right-sided pleural effusion appears minimally smaller.  Please see above.  This is a call report.   Original Report Authenticated By: Lacy Duverney, M.D.   Dg C-arm 1-60 Min-no Report  05/06/2013   CLINICAL DATA: Pleurex catheter insertion; right chest; Maloignant pleural  effusion   C-ARM 1-60 MINUTES  Fluoroscopy was utilized by the requesting physician.  No radiographic  interpretation.     Scheduled Meds: . enoxaparin (LOVENOX) injection  40 mg Subcutaneous Q24H  . famotidine (PEPCID) IV  20 mg Intravenous Q12H  . insulin aspart  0-9 Units Subcutaneous Q4H  . sodium chloride  3 mL Intravenous Q12H   Continuous Infusions: . dextrose 5 % and 0.9% NaCl 100 mL/hr at 05/07/13 0059  . lactated ringers      Principal Problem:   SBO (small bowel obstruction) Active Problems:   Malignant pleural effusion   Malnutrition of moderate degree   Hypokalemia   Anemia, chronic disease    Time spent: 35 minutes.    Chaya Jan  [facility] Pager [phone]  If 7PM-7AM, please contact night-coverage at [email or web address], password [facility] 05/07/2013, 12:39 PM  LOS: 4 days

## 2013-05-07 NOTE — Op Note (Signed)
NAMESTEFANOS, HAYNESWORTH NO.:  [id number]  MEDICAL RECORD NO.:  [id number]  LOCATION:  1410                         FACILITY:  [facility]  PHYSICIAN:  Sheliah Plane, MD    DATE OF BIRTH:  1935-03-01  DATE OF PROCEDURE:  05/06/2013 DATE OF DISCHARGE:                              OPERATIVE REPORT   PREOPERATIVE DIAGNOSIS:  Malignant right pleural effusion, recurrent.  POSTOPERATIVE DIAGNOSIS:  Malignant right pleural effusion, recurrent.  SURGICAL PROCEDURE:  Placement of right PleurX catheter, drainage of pleural effusion under ultrasound and fluoroscopic guidance.  SURGEON:  Sheliah Plane, MD  BRIEF HISTORY:  The patient is a 78 year old male with 3-year history of pancreatic cancer, now with recurrent pleural effusion, which is reaccumulated over a short period of time.  Cytology was positive for malignant cells.  Because of the fairly rapid re-accumulation, placement of a PleurX catheter was recommended, the patient agreed and signed informed consent.  DESCRIPTION OF PROCEDURE:  With appropriate time-out and with the patient under light intravenous sedation, the right chest was prepped with Betadine and draped in a sterile manner.  A 1% lidocaine, total of 15 mL was infiltrated in the lateral right chest wall.  Initially, a 16- gauge needle under fluoroscopic guidance was introduced into the pleural fluid.  Straw-colored fluid was returned and a guidewire under fluoroscopy was passed into the right chest.  Additional counter incision was made more anterior and the PleurX catheter tunneled between the two incisions.  Serial dilators were placed over the guidewire and a peel-away sheath was placed into the right chest.  The wire and dilators were removed and the PleurX catheter was tunneled through the peel-away sheath.  Fluoroscopy showed adequate position of the catheter.  The incisions were closed with silk with interrupted 4-0 subcuticular stitch.  A  single nylon stitch was used to secure the tube in place. The catheter was hooked to a suction bottle and a 1000 mL of fluid was removed.  The catheter was then placed on the patient's side and dressing applied.  He tolerated the procedure without obvious complication and will be transferred back to [facility] for further care for partial small bowel obstruction and underlying malignancy.  Sponge and needle count was reported as correct.  Blood loss was minimal.  The patient tolerated the procedure without obvious complication.     Sheliah Plane, MD     EG/MEDQ  D:  05/06/2013  T:  05/07/2013  Job:  [id number]

## 2013-05-07 NOTE — Progress Notes (Signed)
PARENTERAL NUTRITION CONSULT NOTE - INITIAL  Pharmacy Consult for TPN Indication: Bowel obstruction  Allergies  Allergen Reactions  . Tape     Plastic and silk tape cause skin rash    Patient Measurements: Height: 5\' 7"  (170.2 cm) Weight: 144 lb 2.9 oz (65.4 kg) IBW/kg (Calculated) : 66.1 Adjusted Body Weight: Usual Weight:  Vital Signs: Temp: 98 F (36.7 C) (09/13 0546) Temp src: Oral (09/13 0546) BP: 108/64 mmHg (09/13 0546) Pulse Rate: 86 (09/13 0546) Intake/Output from previous day: 09/12 0701 - 09/13 0700 In: 2195 [I.V.:2095; IV Piggyback:100] Out: 1080 [Urine:325; Emesis/NG output:750; Blood:5] Intake/Output from this shift:    Labs:  Recent Labs  05/05/13 0430 05/05/13 1848 05/07/13 0550  WBC 2.1* 2.0* 1.9*  HGB 10.4* 10.3* 10.3*  HCT 30.9* 30.4* 31.8*  PLT 245 266 227  APTT  --  36  --   INR  --  1.61*  --      Recent Labs  05/05/13 0430 05/05/13 1848 05/07/13 0550  NA 134* 133* 134*  K 3.0* 3.3* 3.5  CL 91* 95* 101  CO2 36* 35* 29  GLUCOSE 82 92 159*  BUN 29* 18 9  CREATININE 1.32 0.86 0.57  CALCIUM 8.0* 7.8* 7.9*  PROT 5.8* 5.6*  --   ALBUMIN 2.0* 1.9*  --   AST 17 14  --   ALT 12 11  --   ALKPHOS 176* 162*  --   BILITOT 0.6 0.5  --   PREALBUMIN 5.8*  --   --    Estimated Creatinine Clearance: 72.7 ml/min (by C-G formula based on Cr of 0.57).    Recent Labs  05/07/13 0351 05/07/13 0822 05/07/13 1129  GLUCAP 115* 119* 129*    Medical History: Past Medical History  Diagnosis Date  . Heart murmur   . Abdominal pain, periumbilical   . Constipation   . Diabetes mellitus   . Hypertension   . Sleep apnea     pt states he no longer uses his cpap - lost weight  . pancreatic ca dx'd 04/2010    METASTATIC PANCREATIC CA WITH METS TO LIVER AND ABDOMINAL WALL  -chemo every 2 weeks - ONCOLOGIST DR. MOHAMED MOHAMED WITH [facility].  Marland Kitchen Port-a-cath in place     RIGHT UPPER CHEST - FOR CHEMO EVERY 2 WEEKS    Insulin  Requirements in the past 24 hours:   1 unit Novolog SSI/24h  Nutritional Goals:  RD recs: from 9/10 -  Kcal: 1700-1900  Protein: 80-90 grams  Fluid: 1.9 L/day  Clinimix E5/15 at a goal rate of 53ml/hr + 20% fat emulsion at 15ml/hr to provide: 90g/day protein, 1747Kcal/day.  Current Nutrition: NPO  IVF: D5W NS at 165ml/hr  Assessment: 86 YOM with h/o metastatic pancreatic cancer admitted with abd pain and SBO,  He has not has nutrition orally for ~1 week so orders to start TPN. CCS has seen patient and monitoring for now and no plans for immediate surgery  9/13: TPN D#1. Appears he is passing some flatus with small BM    Glucose - + h/o DM, currently on SSI with minimal requirement for insulin, CBGs well-controlled  Electrolytes - Na= 134, Phos = 1.8, Corrected Ca WNL  LFTs - previous labs reveal elevated alk phos but AST/ALT and bili are WNL.   TGs - none available   Prealbumin - 9/10 = 5.8  TPN Access: Port-a-cath  Plan: At 1800 today:  Start Clinimix E5/15 at 70ml/hr tonight  Monitor for refeeding syndrome  20% fat emulsion at 79ml/hr.  Plan to advance as tolerated to the goal rate.  Replace phos with KPhos 12 mmol (also provides ~87meq K+)  TNA to contain standard multivitamins and trace elements.  Change IVF to plain NS (remove D5)Reduce IVF to 84ml/hr.  Continue SSI as ordered .   TNA lab panels on Mondays & Thursdays.  F/u daily.  Juliette Alcide, PharmD, BCPS.   Pager: [phone]  05/07/2013,12:45 PM

## 2013-05-07 NOTE — Progress Notes (Signed)
1 Day Post-Op  Subjective: Pt doing well today.  COn't to pass flatus.  No pain.  Has been ambulating well in hall and OOBTC.  Objective: Vital signs in last 24 hours: Temp:  [97.6 F (36.4 C)-98.2 F (36.8 C)] 98 F (36.7 C) (09/13 0546) Pulse Rate:  [86-96] 86 (09/13 0546) Resp:  [10-17] 16 (09/13 0546) BP: (108-126)/(61-71) 108/64 mmHg (09/13 0546) SpO2:  [94 %-100 %] 98 % (09/13 0546) Last BM Date: 05/01/13  Intake/Output from previous day: 09/12 0701 - 09/13 0700 In: 2195 [I.V.:2095; IV Piggyback:100] Out: 1080 [Urine:325; Emesis/NG output:750; Blood:5] Intake/Output this shift:    General appearance: alert and cooperative Cardio: regular rate and rhythm, S1, S2 normal, no murmur, click, rub or gallop GI: soft, hypoactive BS, no tinkles/rushes  Lab Results:   Recent Labs  05/05/13 1848 05/07/13 0550  WBC 2.0* 1.9*  HGB 10.3* 10.3*  HCT 30.4* 31.8*  PLT 266 227   BMET  Recent Labs  05/05/13 1848 05/07/13 0550  NA 133* 134*  K 3.3* 3.5  CL 95* 101  CO2 35* 29  GLUCOSE 92 159*  BUN 18 9  CREATININE 0.86 0.57  CALCIUM 7.8* 7.9*   PT/INR  Recent Labs  05/05/13 1848  LABPROT 18.7*  INR 1.61*   ABG No results found for this basename: PHART, PCO2, PO2, HCO3,  in the last 72 hours  Studies/Results: Dg Abd 1 View  05/06/2013   CLINICAL DATA:  Small bowel obstruction, abdominal discomfort, history hypertension, diabetes  EXAM: ABDOMEN - 1 VIEW  COMPARISON:  05/05/2013  FINDINGS: Nasogastric tube projects over expected position of the duodenal C loop, tip approximately at ligament of Treitz.  Retained contrast in right colon.  Increased small bowel distention compatible with persistent small bowel obstruction.  No definite bowel wall thickening.  Bones demineralized.  Right pelvic phlebolith stable.  IMPRESSION: Persistent small bowel obstruction with increased distention of small bowel loops in the upper abdomen.   Electronically Signed   By: Ulyses Southward  M.D.   On: 05/06/2013 08:03   Dg Chest Port 1 View  05/06/2013   *RADIOLOGY REPORT*  Clinical Data: Right chest tube placement.  PORTABLE CHEST - 1 VIEW  Comparison: 05/05/2013.  Findings: Placement of right chest tube with the tip projecting over the right lung apex.  Small (5%) right apical pneumothorax.  Right-sided pleural effusion appears minimally smaller.  Underlying infiltrate or mass not excluded.  Nasogastric tube courses below the diaphragm.  The tip is not included on this exam.  Cardiomegaly.  Pulmonary vascular prominence.  Right Mediport catheter tip proximal superior vena cava level.  Calcified tortuous aorta.  IMPRESSION: Placement of right chest tube with the tip projecting over the right lung apex.  Small (5%) right apical pneumothorax.  Right-sided pleural effusion appears minimally smaller.  Please see above.  This is a call report.   Original Report Authenticated By: Lacy Duverney, M.D.   Dg C-arm 1-60 Min-no Report  05/06/2013   CLINICAL DATA: Pleurex catheter insertion; right chest; Maloignant pleural  effusion   C-ARM 1-60 MINUTES  Fluoroscopy was utilized by the requesting physician.  No radiographic  interpretation.     Anti-infectives: Anti-infectives   Start     Dose/Rate Route Frequency Ordered Stop   05/06/13 0900  cefUROXime (ZINACEF) 1.5 g in dextrose 5 % 50 mL IVPB  Status:  Discontinued     1.5 g 100 mL/hr over 30 Minutes Intravenous To Surgery 05/06/13 0856 05/06/13 1009  05/06/13 0600  cefUROXime (ZINACEF) 1.5 g in dextrose 5 % 50 mL IVPB     1.5 g 100 mL/hr over 30 Minutes Intravenous 60 min pre-op 05/05/13 1827 05/06/13 0903   05/04/13 1800  ceFEPIme (MAXIPIME) 2 g in dextrose 5 % 50 mL IVPB  Status:  Discontinued     2 g 100 mL/hr over 30 Minutes Intravenous Every 12 hours 05/04/13 1117 05/05/13 1707   05/04/13 0400  vancomycin (VANCOCIN) IVPB 750 mg/150 ml premix  Status:  Discontinued     750 mg 150 mL/hr over 60 Minutes Intravenous Every 12 hours  05/03/13 1332 05/05/13 1707   05/03/13 2200  ceFEPIme (MAXIPIME) 2 g in dextrose 5 % 50 mL IVPB  Status:  Discontinued     2 g 100 mL/hr over 30 Minutes Intravenous 3 times per day 05/03/13 1332 05/04/13 1117   05/03/13 1500  vancomycin (VANCOCIN) IVPB 1000 mg/200 mL premix     1,000 mg 200 mL/hr over 60 Minutes Intravenous STAT 05/03/13 1332 05/03/13 1548   05/03/13 1400  ceFEPIme (MAXIPIME) 2 g in dextrose 5 % 50 mL IVPB     2 g 100 mL/hr over 30 Minutes Intravenous STAT 05/03/13 1332 05/03/13 1442      Assessment/Plan: s/p Procedure(s): INSERTION PLEURAL DRAINAGE CATHETER (Right) Con't with NGT He is 7d without PO/nutrition, would benefit from TNA Con't Amb HE is passing gas, he may have BM as contrast has moved on KUB.   Will await any surgical intervention at this time. AM KUB   LOS: 4 days    Marigene Ehlers., [facility] 05/07/2013

## 2013-05-07 NOTE — Progress Notes (Signed)
Hypoglycemic Event  CBG: 64  Treatment: D50 IV 25 mL  Symptoms: None  Follow-up CBG: Time:0050 CBG Result:134  Possible Reasons for Event: Inadequate meal intake, Patient NPO several days  Comments/MD notified: [facility] NP on call notified, will pass on to rounding MD    Devrin Monforte, Estanislado Emms  Remember to initiate Hypoglycemia Order Set & complete

## 2013-05-08 ENCOUNTER — Inpatient Hospital Stay (HOSPITAL_COMMUNITY): Payer: Medicare Other

## 2013-05-08 LAB — GLUCOSE, CAPILLARY
Glucose-Capillary: 170 mg/dL — ABNORMAL HIGH (ref 70–99)
Glucose-Capillary: 160 mg/dL — ABNORMAL HIGH (ref 70–99)
Glucose-Capillary: 164 mg/dL — ABNORMAL HIGH (ref 70–99)
Glucose-Capillary: 147 mg/dL — ABNORMAL HIGH (ref 70–99)

## 2013-05-08 LAB — PHOSPHORUS: Phosphorus: 1.8 mg/dL — ABNORMAL LOW (ref 2.3–4.6)

## 2013-05-08 LAB — CBC
HCT: 33.1 % — ABNORMAL LOW (ref 39.0–52.0)
MCHC: 32 g/dL (ref 30.0–36.0)
RDW: 17.8 % — ABNORMAL HIGH (ref 11.5–15.5)

## 2013-05-08 LAB — DIFFERENTIAL
Basophils Absolute: 0 10*3/uL (ref 0.0–0.1)
Eosinophils Absolute: 0.1 10*3/uL (ref 0.0–0.7)
Lymphocytes Relative: 29 % (ref 12–46)
Neutro Abs: 0.9 10*3/uL — ABNORMAL LOW (ref 1.7–7.7)

## 2013-05-08 LAB — COMPREHENSIVE METABOLIC PANEL
ALT: 9 U/L (ref 0–53)
BUN: 8 mg/dL (ref 6–23)
CO2: 28 mEq/L (ref 19–32)
Calcium: 7.9 mg/dL — ABNORMAL LOW (ref 8.4–10.5)
Creatinine, Ser: 0.53 mg/dL (ref 0.50–1.35)
GFR calc Af Amer: >90 mL/min (ref >90)
GFR calc non Af Amer: >90 mL/min (ref >90)
Glucose, Bld: 167 mg/dL — ABNORMAL HIGH (ref 70–99)

## 2013-05-08 LAB — TRIGLYCERIDES: Triglycerides: 83 mg/dL (ref <150)

## 2013-05-08 MED ORDER — FAT EMULSION 20 % IV EMUL
250.0000 mL | INTRAVENOUS | Status: AC
  Administered 2013-05-08: 250 mL via INTRAVENOUS
  Filled 2013-05-08: qty 250

## 2013-05-08 MED ORDER — TRACE MINERALS CR-CU-F-FE-I-MN-MO-SE-ZN IV SOLN
INTRAVENOUS | Status: AC
  Administered 2013-05-08: 17:00:00 via INTRAVENOUS
  Filled 2013-05-08: qty 2000

## 2013-05-08 MED ORDER — SODIUM CHLORIDE 0.9 % IV SOLN: INTRAVENOUS | Status: AC

## 2013-05-08 MED ORDER — SODIUM PHOSPHATE 3 MMOLE/ML IV SOLN
20.0000 mmol | Freq: Once | INTRAVENOUS | Status: AC
  Administered 2013-05-08: 11:00:00 20 mmol via INTRAVENOUS
  Filled 2013-05-08: qty 6.67

## 2013-05-08 NOTE — Progress Notes (Signed)
[facility] PROGRESS NOTE  Philip Shaw MVH:[id number] DOB: Nov 14, 1934 DOA: 05/03/2013 PCP: [name], MD  Assessment/Plan: SBO -Likely from progression of cancer. -Appreciate surgical input. -Had a small BM and continues to pass flatus. -Abd xray shows improving SBO. -NG has been clamped; if tolerates will attempt trial of clears in the am. -Will start TNA as he has been without nutrition for about 6 days.  Large Exudative Right Pleural Effusion -S/p large volume thoracentesis on 9/9 of 2 L. -S/p pleurex catheter placement on 05/06/13. -Pleural fluid with malignant cells. -Do not feel that clinically or by imaging he has PNA, so will DC vanc/Cefepime.  Metastatic Pancreatic Cancer -Pleural fluid with malignant cells, CT with pleural and peritoneal nodules concerning for metastatic disease. -As per oncology.  AOCD -He is now s/p transfusion of 2 units of PRBCs with a Hb of 10.4.  Hypokalemia -From NG suction. -Repleted. -Recheck in am.  Code Status: Full Code Family Communication: Discussed via phone with daughter Clent Ridges.  Disposition Plan: Home when medically ready.    Consultants:  Surgery  Pulmonary  CVTS   Antibiotics:  None  Subjective: Feels much better today, Less SOB/nausea and decreased abdominal pain. In good spirits. Passed kidney stone. Had BM.  Objective: Filed Vitals:   05/07/13 0546 05/07/13 1349 05/07/13 2101 05/08/13 0451  BP: 108/64 126/81 117/67 113/63  Pulse: 86 82 101 98  Temp: 98 F (36.7 C) 97.8 F (36.6 C) 98.3 F (36.8 C) 98 F (36.7 C)  TempSrc: Oral Oral Oral Oral  Resp: 16 20 18 18   Height:      Weight:      SpO2: 98% 100% 99% 100%    Intake/Output Summary (Last 24 hours) at 05/08/13 1331 Last data filed at 05/08/13 8413  Gross per 24 hour  Intake   1436 ml  Output   2800 ml  Net  -1364 ml   Filed Weights   05/03/13 1538 05/03/13 1841  Weight: 69.6 kg (153 lb 7 oz) 65.4 kg (144 lb 2.9 oz)     Exam:   General:  AA Ox3  Cardiovascular: RRR, no M/R/G  Respiratory: decreased BS right base  Abdomen: S/ND/+BS  Extremities: 1+edema   Neurologic:  Non-focal.  Data Reviewed: Basic Metabolic Panel:  Recent Labs Lab 05/04/13 0455 05/05/13 0430 05/05/13 1848 05/07/13 0550 05/08/13 0525  NA 131* 134* 133* 134* 134*  K 3.5 3.0* 3.3* 3.5 3.7  CL 89* 91* 95* 101 100  CO2 36* 36* 35* 29 28  GLUCOSE 167* 82 92 159* 167*  BUN 36* 29* 18 9 8   CREATININE 1.26 1.32 0.86 0.57 0.53  CALCIUM 8.3* 8.0* 7.8* 7.9* 7.9*  MG  --   --   --  2.1 1.9  PHOS  --   --   --  1.8* 1.8*   Liver Function Tests:  Recent Labs Lab 05/05/13 0430 05/05/13 1848 05/08/13 0525  AST 17 14 15   ALT 12 11 9   ALKPHOS 176* 162* 140*  BILITOT 0.6 0.5 0.7  PROT 5.8* 5.6* 5.0*  ALBUMIN 2.0* 1.9* 1.7*   No results found for this basename: LIPASE, AMYLASE,  in the last 168 hours No results found for this basename: AMMONIA,  in the last 168 hours CBC:  Recent Labs Lab 05/03/13 1230 05/04/13 0455 05/05/13 0430 05/05/13 1848 05/07/13 0550 05/08/13 0525  WBC 1.5*  1.5* 2.4* 2.1* 2.0* 1.9* 2.5*  NEUTROABS 0.9*  --   --   --   --  0.9*  HGB 9.2*  9.3* 7.9* 10.4* 10.3* 10.3* 10.6*  HCT 27.7*  28.0* 24.2* 30.9* 30.4* 31.8* 33.1*  MCV 87.9  87.8 88.6 88.5 87.1 91.6 90.4  PLT 307  310 261 245 266 227 272   Cardiac Enzymes: No results found for this basename: CKTOTAL, CKMB, CKMBINDEX, TROPONINI,  in the last 168 hours BNP (last 3 results) No results found for this basename: PROBNP,  in the last 8760 hours CBG:  Recent Labs Lab 05/07/13 2108 05/08/13 0007 05/08/13 0442 05/08/13 0758 05/08/13 1132  GLUCAP 167* 168* 148* 170* 160*    Recent Results (from the past 240 hour(s))  BODY FLUID CULTURE     Status: None   Collection Time    05/03/13  7:01 PM      Result Value Range Status   Specimen Description THORACENTESIS   Final   Special Requests Immunocompromised   Final    Gram Stain     Final   Value: WBC PRESENT,BOTH PMN AND MONONUCLEAR     NO ORGANISMS SEEN     Performed by [facility]     Performed at [facility]   Culture     Final   Value: NO GROWTH 3 DAYS     Performed at [employer] [facility]   Report Status 05/07/2013 FINAL   Final  GRAM STAIN     Status: None   Collection Time    05/03/13  7:02 PM      Result Value Range Status   Specimen Description THORACENTESIS   Final   Special Requests Immunocompromised   Final   Gram Stain     Final   Value: CYTOSPIN     NO ORGANISMS SEEN     WBC PRESENT,BOTH PMN AND MONONUCLEAR     Gram Stain Report Called to,Read Back By and Verified With: S YOUNG RN 2048 05/03/13 A NAVARRO   Report Status 05/03/2013 FINAL   Final  SURGICAL PCR SCREEN     Status: None   Collection Time    05/05/13  9:48 PM      Result Value Range Status   MRSA, PCR NEGATIVE  NEGATIVE Final   Staphylococcus aureus NEGATIVE  NEGATIVE Final   Comment:            The Xpert SA Assay (FDA     approved for NASAL specimens     in patients over 39 years of age),     is one component of     a comprehensive surveillance     program.  Test performance has     been validated by [employer] for patients greater     than or equal to 59 year old.     It is not intended     to diagnose infection nor to     guide or monitor treatment.     Studies: Dg Chest Port 1 View  05/08/2013   *RADIOLOGY REPORT*  Clinical Data: Postoperative evaluation chest tube  PORTABLE CHEST - 1 VIEW  Comparison: Prior chest x-ray 05/06/2013  Findings: Stable to slightly decreased right apical pneumothorax. Right thoracostomy tube remains in place.  Right IJ approach central venous catheter remains in unchanged position.  Nasogastric tube is present, the tip lies the visualized portion of the stomach.  Stable to slightly decreased right pleural effusion with associated right basilar atelectasis versus infiltrate left lower lobe.  Cardiac and  mediastinal, unchanged.  Atherosclerotic calcifications  IMPRESSION:  1.  Stable to slightly decreased right apical pneumothorax. 2.  Position of support apparatus. 3.  Slightly decreased right pleural effusion with residual right lower lobe atelectasis versus infiltrate.   Original Report Authenticated By: Malachy Moan, M.D.   Dg Abd 2 Views  05/08/2013   *RADIOLOGY REPORT*  Clinical Data: Abdominal distension, follow-up small bowel obstruction  ABDOMEN - 2 VIEW  Comparison: 05/06/2013  Findings: NG tube in stable position with tip in the expected position of distal jejunum or proximal jejunum and oral contrast is seen into the transverse colon with gas into the rectum.There is decreased gaseous distension of the small bowel consistent with resolving obstruction.  In the right upper quadrant on the decubitus view, there is a 4cm air-fluid level, not in anticipated position for bowel.  IMPRESSION: 1.  Improving bowel gas pattern with resolving small bowel distension.  2.  Focal 4cm air-fluid level right upper quadrant of uncertain origin.  A review of CT images 05/04/13 showed some air within the gallbladder and it is possible that the air fluid level may reflect this, although other causes would include abscess.  The finding does not appear to be related to bowel.  Follow-up with CT scanning of the abdomen suggested.   Original Report Authenticated By: Esperanza Heir, M.D.    Scheduled Meds: . enoxaparin (LOVENOX) injection  40 mg Subcutaneous Q24H  . famotidine (PEPCID) IV  20 mg Intravenous Q12H  . insulin aspart  0-9 Units Subcutaneous Q4H  . sodium chloride  3 mL Intravenous Q12H  . sodium phosphate  Dextrose 5% IVPB  20 mmol Intravenous Once   Continuous Infusions: . sodium chloride    . sodium chloride    . Marland KitchenTPN (CLINIMIX-E) Adult 40 mL/hr at 05/07/13 1730   And  . fat emulsion 250 mL (05/07/13 1731)  . Marland KitchenTPN (CLINIMIX-E) Adult     And  . fat emulsion      Principal Problem:   SBO  (small bowel obstruction) Active Problems:   Malignant pleural effusion   Malnutrition of moderate degree   Hypokalemia   Anemia, chronic disease    Time spent: 35 minutes.    Chaya Jan  [facility] Pager [phone]  If 7PM-7AM, please contact night-coverage at [email or web address], password [facility] 05/08/2013, 1:31 PM  LOS: 5 days

## 2013-05-08 NOTE — Progress Notes (Signed)
2 Days Post-Op  Subjective: Pt doing well.  Feels less distended.  Passed BM and flatus yesterday.  Also passed kidney stone.  Objective: Vital signs in last 24 hours: Temp:  [97.8 F (36.6 C)-98.3 F (36.8 C)] 98 F (36.7 C) (09/14 0451) Pulse Rate:  [82-101] 98 (09/14 0451) Resp:  [18-20] 18 (09/14 0451) BP: (113-126)/(63-81) 113/63 mmHg (09/14 0451) SpO2:  [99 %-100 %] 100 % (09/14 0451) Last BM Date: 05/07/13  Intake/Output from previous day: 09/13 0701 - 09/14 0700 In: 1586 [P.O.:90; I.V.:720; IV Piggyback:200; TPN:576] Out: 2400 [Urine:525; Emesis/NG output:925; Chest Tube:950] Intake/Output this shift:    General appearance: alert and cooperative GI: soft, non-tender; bowel sounds normal; no masses,  no organomegaly  Lab Results:   Recent Labs  05/07/13 0550 05/08/13 0525  WBC 1.9* 2.5*  HGB 10.3* 10.6*  HCT 31.8* 33.1*  PLT 227 272   BMET  Recent Labs  05/07/13 0550 05/08/13 0525  NA 134* 134*  K 3.5 3.7  CL 101 100  CO2 29 28  GLUCOSE 159* 167*  BUN 9 8  CREATININE 0.57 0.53  CALCIUM 7.9* 7.9*   PT/INR  Recent Labs  05/05/13 1848  LABPROT 18.7*  INR 1.61*   ABG No results found for this basename: PHART, PCO2, PO2, HCO3,  in the last 72 hours  Studies/Results: Dg Chest Port 1 View  05/06/2013   *RADIOLOGY REPORT*  Clinical Data: Right chest tube placement.  PORTABLE CHEST - 1 VIEW  Comparison: 05/05/2013.  Findings: Placement of right chest tube with the tip projecting over the right lung apex.  Small (5%) right apical pneumothorax.  Right-sided pleural effusion appears minimally smaller.  Underlying infiltrate or mass not excluded.  Nasogastric tube courses below the diaphragm.  The tip is not included on this exam.  Cardiomegaly.  Pulmonary vascular prominence.  Right Mediport catheter tip proximal superior vena cava level.  Calcified tortuous aorta.  IMPRESSION: Placement of right chest tube with the tip projecting over the right lung apex.   Small (5%) right apical pneumothorax.  Right-sided pleural effusion appears minimally smaller.  Please see above.  This is a call report.   Original Report Authenticated By: Lacy Duverney, M.D.   Dg C-arm 1-60 Min-no Report  05/06/2013   CLINICAL DATA: Pleurex catheter insertion; right chest; Maloignant pleural  effusion   C-ARM 1-60 MINUTES  Fluoroscopy was utilized by the requesting physician.  No radiographic  interpretation.     Anti-infectives: Anti-infectives   Start     Dose/Rate Route Frequency Ordered Stop   05/06/13 0900  cefUROXime (ZINACEF) 1.5 g in dextrose 5 % 50 mL IVPB  Status:  Discontinued     1.5 g 100 mL/hr over 30 Minutes Intravenous To Surgery 05/06/13 0856 05/06/13 1009   05/06/13 0600  cefUROXime (ZINACEF) 1.5 g in dextrose 5 % 50 mL IVPB     1.5 g 100 mL/hr over 30 Minutes Intravenous 60 min pre-op 05/05/13 1827 05/06/13 0903   05/04/13 1800  ceFEPIme (MAXIPIME) 2 g in dextrose 5 % 50 mL IVPB  Status:  Discontinued     2 g 100 mL/hr over 30 Minutes Intravenous Every 12 hours 05/04/13 1117 05/05/13 1707   05/04/13 0400  vancomycin (VANCOCIN) IVPB 750 mg/150 ml premix  Status:  Discontinued     750 mg 150 mL/hr over 60 Minutes Intravenous Every 12 hours 05/03/13 1332 05/05/13 1707   05/03/13 2200  ceFEPIme (MAXIPIME) 2 g in dextrose 5 % 50 mL IVPB  Status:  Discontinued     2 g 100 mL/hr over 30 Minutes Intravenous 3 times per day 05/03/13 1332 05/04/13 1117   05/03/13 1500  vancomycin (VANCOCIN) IVPB 1000 mg/200 mL premix     1,000 mg 200 mL/hr over 60 Minutes Intravenous STAT 05/03/13 1332 05/03/13 1548   05/03/13 1400  ceFEPIme (MAXIPIME) 2 g in dextrose 5 % 50 mL IVPB     2 g 100 mL/hr over 30 Minutes Intravenous STAT 05/03/13 1332 05/03/13 1442      Assessment/Plan: s/p Procedure(s): INSERTION PLEURAL DRAINAGE CATHETER (Right) Clamp NGT KUB shows contrast in transverse and L colon.  Will attempt clamping NGT and see if pt tol If tol clamping of NGT,  may removed tomorrow start clears.  LOS: 5 days    Marigene Ehlers., [facility] 05/08/2013

## 2013-05-08 NOTE — Progress Notes (Addendum)
PARENTERAL NUTRITION CONSULT NOTE - Follow-up  Pharmacy Consult for TPN Indication: Bowel obstruction  Allergies  Allergen Reactions  . Tape     Plastic and silk tape cause skin rash    Patient Measurements: Height: 5\' 7"  (170.2 cm) Weight: 144 lb 2.9 oz (65.4 kg) IBW/kg (Calculated) : 66.1 Adjusted Body Weight: Usual Weight:  Vital Signs: Temp: 98 F (36.7 C) (09/14 0451) Temp src: Oral (09/14 0451) BP: 113/63 mmHg (09/14 0451) Pulse Rate: 98 (09/14 0451) Intake/Output from previous day: 09/13 0701 - 09/14 0700 In: 1586 [P.O.:90; I.V.:720; IV Piggyback:200; TPN:576] Out: 2400 [Urine:525; Emesis/NG output:925; Chest Tube:950] Intake/Output from this shift:    Labs:  Recent Labs  05/05/13 1848 05/07/13 0550 05/08/13 0525  WBC 2.0* 1.9* 2.5*  HGB 10.3* 10.3* 10.6*  HCT 30.4* 31.8* 33.1*  PLT 266 227 272  APTT 36  --   --   INR 1.61*  --   --      Recent Labs  05/05/13 1848 05/07/13 0550 05/08/13 0525  NA 133* 134* 134*  K 3.3* 3.5 3.7  CL 95* 101 100  CO2 35* 29 28  GLUCOSE 92 159* 167*  BUN 18 9 8   CREATININE 0.86 0.57 0.53  CALCIUM 7.8* 7.9* 7.9*  MG  --  2.1 1.9  PHOS  --  1.8* 1.8*  PROT 5.6*  --  5.0*  ALBUMIN 1.9*  --  1.7*  AST 14  --  15  ALT 11  --  9  ALKPHOS 162*  --  140*  BILITOT 0.5  --  0.7   Estimated Creatinine Clearance: 72.7 ml/min (by C-G formula based on Cr of 0.53).    Recent Labs  05/07/13 2108 05/08/13 0007 05/08/13 0442  GLUCAP 167* 168* 148*    Medical History: Past Medical History  Diagnosis Date  . Heart murmur   . Abdominal pain, periumbilical   . Constipation   . Diabetes mellitus   . Hypertension   . Sleep apnea     pt states he no longer uses his cpap - lost weight  . pancreatic ca dx'd 04/2010    METASTATIC PANCREATIC CA WITH METS TO LIVER AND ABDOMINAL WALL  -chemo every 2 weeks - ONCOLOGIST DR. MOHAMED MOHAMED WITH [facility].  Marland Kitchen Port-a-cath in place     RIGHT UPPER CHEST - FOR  CHEMO EVERY 2 WEEKS    Insulin Requirements in the past 24 hours:   6 unit Novolog SSI/24h  Nutritional Goals:  RD recs: from 9/10 -  Kcal: 1700-1900  Protein: 80-90 grams  Fluid: 1.9 L/day  Clinimix E5/15 at a goal rate of 68ml/hr + 20% fat emulsion at 53ml/hr to provide: 90g/day protein, 1747Kcal/day.  Current Nutrition: NPO  IVF: NS at 28ml/hr  Assessment: 83 YOM with h/o metastatic pancreatic cancer admitted with abd pain and SBO,  no nutrition orally for ~1 week so orders to start TPN. CCS has seen patient and monitoring for now and no plans for immediate surgery  9/14: TPN D#2. Appears he is passing some flatus and had BM, passed renal stone, CCS to clamp NGT to see how tolerates. If tolerates, clears to start tomorrow   Glucose - + h/o DM (on lantus prior to admission), CBGs have increased with start of TPN as expected, slightly above goal of < 150mg /dl  Electrolytes - Na= 161, Phos = 1.8, Corrected Ca WNL  LFTs - previous labs reveal elevated alk phos but AST/ALT and bili are WNL.  TGs - none available (pending) 9/14  Prealbumin - 9/10 = 5.8  TPN Access: Port-a-cath  Plan: At 1800 today:  Increase Clinimix E5/15 to 24ml/hr tonight  Monitor for refeeding syndrome - No strong indications of this as Phos only elyte low (was low prior to nutrition)  Add 20 units insulin to TPN bag - 10 units/L to provide 14 units/24h  (based on SSI use and on lantus PTA)  20% fat emulsion at 12ml/hr.  Plan to advance as tolerated to the goal rate.  Replace phos with NaPhos 20 mmol IVPB x 1.   TNA to contain standard multivitamins and trace elements.  Adjust IVF to 40ml/hr.  Continue SSI as ordered .   TNA lab panels on Mondays & Thursdays.  Follow-up triglycerides  F/u daily.  Juliette Alcide, PharmD, BCPS.   Pager: [phone]  05/08/2013,7:06 AM

## 2013-05-08 NOTE — Progress Notes (Signed)
NUTRITION FOLLOW UP  Intervention:   TPN per pharmacy RD to continue to follow nutrition care plan  Nutrition Dx:   Inadequate oral intake related to inability to eat as evidenced by NPO status; ongoing  Goal:   Pt to meet >/= 90% of their estimated nutrition needs; not met  Monitor:   TPN, weight trends, I/O's, labs  Assessment:   78 y.o. male with prior h/o metastatic pancreatic cancer , hypertension, DM, was brought in by family for persistent nausea and vomiting. After arrival to ED, he was found hypoxic, dyspneic, and his oxygen sats were in low 80's. His CXR revealed large right pleural effusion. He also reports left quadrant abdominal pain and adbominal distention. His abdominal films show mod degree of SBO.  Per MD note, SBO likely from progression of his cancer.   Pt reports that he is tolerating TPN well and that he wants solid foods. He wanted to know how long he would have to be on TPN.   Pt to increase to Clinimix E5/15 to 57ml/hr starting tonight which provides 1022 kcal and 72 g of protein.  This meets 60% of estimated calorie needs and 90% of estimated protein needs.   Clinimix E5/15 at a goal rate of 35ml/hr + 20% fat emulsion at 30ml/hr to provide:  90g/day protein, 1747Kcal/day. This meets 100% of estimated calorie and protein needs.   Height: Ht Readings from Last 1 Encounters:  05/03/13 5\' 7"  (1.702 m)    Weight Status:   Wt Readings from Last 1 Encounters:  05/03/13 144 lb 2.9 oz (65.4 kg)    Re-estimated needs:  Kcal: 1700-1900 Protein: 80-90 g Fluid: 1.7-1.9 L/day  Skin: incision on chest  Diet Order: NPO   Intake/Output Summary (Last 24 hours) at 05/08/13 1309 Last data filed at 05/08/13 1610  Gross per 24 hour  Intake   1436 ml  Output   2800 ml  Net  -1364 ml    Last BM: none recorded   Labs:   Recent Labs Lab 05/05/13 1848 05/07/13 0550 05/08/13 0525  NA 133* 134* 134*  K 3.3* 3.5 3.7  CL 95* 101 100  CO2 35* 29 28   BUN 18 9 8   CREATININE 0.86 0.57 0.53  CALCIUM 7.8* 7.9* 7.9*  MG  --  2.1 1.9  PHOS  --  1.8* 1.8*  GLUCOSE 92 159* 167*    CBG (last 3)   Recent Labs  05/08/13 0442 05/08/13 0758 05/08/13 1132  GLUCAP 148* 170* 160*    Scheduled Meds: . enoxaparin (LOVENOX) injection  40 mg Subcutaneous Q24H  . famotidine (PEPCID) IV  20 mg Intravenous Q12H  . insulin aspart  0-9 Units Subcutaneous Q4H  . sodium chloride  3 mL Intravenous Q12H  . sodium phosphate  Dextrose 5% IVPB  20 mmol Intravenous Once    Continuous Infusions: . sodium chloride    . sodium chloride    . Marland KitchenTPN (CLINIMIX-E) Adult 40 mL/hr at 05/07/13 1730   And  . fat emulsion 250 mL (05/07/13 1731)  . Marland KitchenTPN (CLINIMIX-E) Adult     And  . fat emulsion      Ebbie Latus RD, LDN

## 2013-05-09 ENCOUNTER — Inpatient Hospital Stay: Payer: Medicare Other

## 2013-05-09 ENCOUNTER — Inpatient Hospital Stay (HOSPITAL_COMMUNITY): Payer: Medicare Other

## 2013-05-09 ENCOUNTER — Other Ambulatory Visit: Payer: Medicare Other | Admitting: Lab

## 2013-05-09 ENCOUNTER — Ambulatory Visit: Payer: Medicare Other | Admitting: Physician Assistant

## 2013-05-09 LAB — CBC
HCT: 32.4 % — ABNORMAL LOW (ref 39.0–52.0)
Hemoglobin: 10.6 g/dL — ABNORMAL LOW (ref 13.0–17.0)
MCHC: 32.7 g/dL (ref 30.0–36.0)
RBC: 3.54 MIL/uL — ABNORMAL LOW (ref 4.22–5.81)
WBC: 3.5 10*3/uL — ABNORMAL LOW (ref 4.0–10.5)

## 2013-05-09 LAB — PHOSPHORUS: Phosphorus: 2 mg/dL — ABNORMAL LOW (ref 2.3–4.6)

## 2013-05-09 LAB — COMPREHENSIVE METABOLIC PANEL
AST: 19 U/L (ref 0–37)
Albumin: 1.7 g/dL — ABNORMAL LOW (ref 3.5–5.2)
Chloride: 99 mEq/L (ref 96–112)
Creatinine, Ser: 0.43 mg/dL — ABNORMAL LOW (ref 0.50–1.35)
Total Bilirubin: 0.6 mg/dL (ref 0.3–1.2)
Total Protein: 5.2 g/dL — ABNORMAL LOW (ref 6.0–8.3)

## 2013-05-09 LAB — DIFFERENTIAL
Lymphocytes Relative: 29 % (ref 12–46)
Lymphs Abs: 1 10*3/uL (ref 0.7–4.0)
Monocytes Absolute: 1.3 10*3/uL — ABNORMAL HIGH (ref 0.1–1.0)
Monocytes Relative: 37 % — ABNORMAL HIGH (ref 3–12)
Neutro Abs: 1 10*3/uL — ABNORMAL LOW (ref 1.7–7.7)
Neutrophils Relative %: 29 % — ABNORMAL LOW (ref 43–77)

## 2013-05-09 LAB — MAGNESIUM: Magnesium: 1.9 mg/dL (ref 1.5–2.5)

## 2013-05-09 LAB — PREALBUMIN: Prealbumin: 4.1 mg/dL — ABNORMAL LOW (ref 17.0–34.0)

## 2013-05-09 LAB — GLUCOSE, CAPILLARY
Glucose-Capillary: 137 mg/dL — ABNORMAL HIGH (ref 70–99)
Glucose-Capillary: 188 mg/dL — ABNORMAL HIGH (ref 70–99)

## 2013-05-09 MED ORDER — FAT EMULSION 20 % IV EMUL
192.0000 mL | INTRAVENOUS | Status: AC
  Administered 2013-05-09: 192 mL via INTRAVENOUS
  Filled 2013-05-09: qty 200

## 2013-05-09 MED ORDER — POTASSIUM PHOSPHATE DIBASIC 3 MMOLE/ML IV SOLN
10.0000 mmol | Freq: Once | INTRAVENOUS | Status: AC
  Administered 2013-05-09: 11:00:00 10 mmol via INTRAVENOUS
  Filled 2013-05-09: qty 3.33

## 2013-05-09 MED ORDER — TRACE MINERALS CR-CU-F-FE-I-MN-MO-SE-ZN IV SOLN
INTRAVENOUS | Status: AC
  Administered 2013-05-09: 17:00:00 via INTRAVENOUS
  Filled 2013-05-09: qty 2000

## 2013-05-09 MED ORDER — SODIUM CHLORIDE 0.9 % IV SOLN: INTRAVENOUS | Status: DC

## 2013-05-09 NOTE — Progress Notes (Signed)
Seen, agree with above.   Had flatus.  NGT clamped without nausea or vomiting.   Clears.  Hope to d/c ngt tomorrow and advance diet.

## 2013-05-09 NOTE — Progress Notes (Signed)
3 Days Post-Op  Subjective: Feels better, less distension, ongoing flatus, walked a couple times yesterday.  Objective: Vital signs in last 24 hours: Temp:  [97.4 F (36.3 C)-98.3 F (36.8 C)] 97.4 F (36.3 C) (09/15 0416) Pulse Rate:  [90-97] 97 (09/15 0416) Resp:  [18-20] 18 (09/15 0416) BP: (111-139)/(67-72) 126/72 mmHg (09/15 0416) SpO2:  [100 %] 100 % (09/15 0416) Last BM Date: 05/07/13 925 ml Saturday, none listed from yesterday, 325 from CT yesterday3 Diet:  TNA/ice chips Afebrile, VSS Labs OK K+, up to 3.8 No film. Intake/Output from previous day: 09/14 0701 - 09/15 0700 In: 2353 [P.O.:120; I.V.:490; IV Piggyback:356.7; TPN:1386.3] Out: 1500 [Urine:1150; Chest Tube:350] Intake/Output this shift:    General appearance: alert, cooperative and no distress GI: soft, he doesn't appear distended, no tenderness, + BS  Lab Results:   Recent Labs  05/08/13 0525 05/09/13 0432  WBC 2.5* 3.5*  HGB 10.6* 10.6*  HCT 33.1* 32.4*  PLT 272 267    BMET  Recent Labs  05/08/13 0525 05/09/13 0432  NA 134* 132*  K 3.7 3.8  CL 100 99  CO2 28 27  GLUCOSE 167* 158*  BUN 8 12  CREATININE 0.53 0.43*  CALCIUM 7.9* 7.9*   PT/INR No results found for this basename: LABPROT, INR,  in the last 72 hours   Recent Labs Lab 05/05/13 0430 05/05/13 1848 05/08/13 0525 05/09/13 0432  AST 17 14 15 19   ALT 12 11 9 8   ALKPHOS 176* 162* 140* 153*  BILITOT 0.6 0.5 0.7 0.6  PROT 5.8* 5.6* 5.0* 5.2*  ALBUMIN 2.0* 1.9* 1.7* 1.7*     Lipase  No results found for this basename: lipase     Studies/Results: Dg Chest Port 1 View  05/08/2013   *RADIOLOGY REPORT*  Clinical Data: Postoperative evaluation chest tube  PORTABLE CHEST - 1 VIEW  Comparison: Prior chest x-ray 05/06/2013  Findings: Stable to slightly decreased right apical pneumothorax. Right thoracostomy tube remains in place.  Right IJ approach central venous catheter remains in unchanged position.  Nasogastric tube is  present, the tip lies the visualized portion of the stomach.  Stable to slightly decreased right pleural effusion with associated right basilar atelectasis versus infiltrate left lower lobe.  Cardiac and mediastinal, unchanged.  Atherosclerotic calcifications  IMPRESSION: 1.  Stable to slightly decreased right apical pneumothorax. 2.  Position of support apparatus. 3.  Slightly decreased right pleural effusion with residual right lower lobe atelectasis versus infiltrate.   Original Report Authenticated By: Malachy Moan, M.D.   Dg Abd 2 Views  05/08/2013   *RADIOLOGY REPORT*  Clinical Data: Abdominal distension, follow-up small bowel obstruction  ABDOMEN - 2 VIEW  Comparison: 05/06/2013  Findings: NG tube in stable position with tip in the expected position of distal jejunum or proximal jejunum and oral contrast is seen into the transverse colon with gas into the rectum.There is decreased gaseous distension of the small bowel consistent with resolving obstruction.  In the right upper quadrant on the decubitus view, there is a 4cm air-fluid level, not in anticipated position for bowel.  IMPRESSION: 1.  Improving bowel gas pattern with resolving small bowel distension.  2.  Focal 4cm air-fluid level right upper quadrant of uncertain origin.  A review of CT images 05/04/13 showed some air within the gallbladder and it is possible that the air fluid level may reflect this, although other causes would include abscess.  The finding does not appear to be related to bowel.  Follow-up with  CT scanning of the abdomen suggested.   Original Report Authenticated By: Esperanza Heir, M.D.    Medications: . enoxaparin (LOVENOX) injection  40 mg Subcutaneous Q24H  . famotidine (PEPCID) IV  20 mg Intravenous Q12H  . insulin aspart  0-9 Units Subcutaneous Q4H  . sodium chloride  3 mL Intravenous Q12H    Assessment/Plan 1. SBO with pancreatic cancer Stage IIIB T3 N1M0, s/p Distal pancreectomy and spleenectomy 06/2010.   2. On going Chemotherapy  3.AODM s/p pancreatectomy  4.Hypertension  5. Hypokalemia (K+ up to 3.8) 6. Large right Pleural effusiMALIGNANT CELLS CONSISTENT WITH METASTATIC ADENOCARCINOMA. S/p INSERTION PLEURAL DRAINAGE CATHETER(Right) With Ultrasound guidance and Fluro, 05/06/13. Nephrolithiasis passed a stone    Plan:  I have encouraged him and family to walk him more.  I will let him have sips of clears, when he is up in the chair.  I told him i would take tube out when I was more convinced he was over all of this. Dr. Donell Beers will see later today. NG is in to far, but I will leave it as is for now in hopes of pulling it soon.    LOS: 6 days    Philip Shaw 05/09/2013

## 2013-05-09 NOTE — Progress Notes (Signed)
PARENTERAL NUTRITION CONSULT NOTE - FOLLOW UP  Pharmacy Consult for TNA Indication: SBO  Allergies  Allergen Reactions  . Tape     Plastic and silk tape cause skin rash    Patient Measurements: Height: 5\' 7"  (170.2 cm) Weight: 144 lb 2.9 oz (65.4 kg) IBW/kg (Calculated) : 66.1 Adjusted Body Weight: 66.1  Vital Signs: Temp: 97.4 F (36.3 C) (09/15 0416) Temp src: Oral (09/15 0416) BP: 126/72 mmHg (09/15 0416) Pulse Rate: 97 (09/15 0416) Intake/Output from previous day: 09/14 0701 - 09/15 0700 In: 2353 [P.O.:120; I.V.:490; IV Piggyback:356.7; TPN:1386.3] Out: 1500 [Urine:1150; Chest Tube:350] Intake/Output from this shift:    Labs:  Recent Labs  05/07/13 0550 05/08/13 0525 05/09/13 0432  WBC 1.9* 2.5* 3.5*  HGB 10.3* 10.6* 10.6*  HCT 31.8* 33.1* 32.4*  PLT 227 272 267     Recent Labs  05/07/13 0550 05/08/13 0525 05/09/13 0432  NA 134* 134* 132*  K 3.5 3.7 3.8  CL 101 100 99  CO2 29 28 27   GLUCOSE 159* 167* 158*  BUN 9 8 12   CREATININE 0.57 0.53 0.43*  CALCIUM 7.9* 7.9* 7.9*  MG 2.1 1.9 1.9  PHOS 1.8* 1.8* 2.0*  PROT  --  5.0* 5.2*  ALBUMIN  --  1.7* 1.7*  AST  --  15 19  ALT  --  9 8  ALKPHOS  --  140* 153*  BILITOT  --  0.7 0.6  TRIG  --  83 72   Estimated Creatinine Clearance: 72.7 ml/min (by C-G formula based on Cr of 0.43).    Recent Labs  05/08/13 2004 05/09/13 0003 05/09/13 0355  GLUCAP 147* 137* 137*   CBGs & Insulin requirements past 24 hours:  - CBGs < 160, required 10 units SSI  Assessment:  57 YOM with h/o metastatic pancreatic cancer admitted with abd pain and SBO. Pt without nutritional orally for ~1 week and MD ordered to start TPN 9/13.  Surgery on board - no plans for immediate surgery.   Today, 9/15, TNA day# 3 via R chest implanted port. Abx X-ray with improving SBO, previously noted air-fluid in RUQ remains (may need CT to r/o abscess), sips of clear today.  Nutritional Goals:  - RD recs 9/14: 1700-1900 Kcal,  80-90g protein - Clinimix E 5/20 @ goal rate of 70 ml/hr + IVF 20% at 8 ml/hr will provide: 84 g protein, 1862 Kcal per day   Current nutrition:  - Diet: NPO except for ice chips - TNA: Clinimix E 5/15 @ 60 ml/hr and IVFE 20% at 8 ml/hr - mIVF: NS @ 40 ml/hr   Labs: Electrolytes: Na+ low/stable (unable to adjust in Clinimix bag), phos = 2, other lytes wnl Renal Function: Scr trending down, UOP: 0.6 ml/kg/hr Hepatic Function: alk phos improving (153), AST/ALT wnl Pre-Albumin: 5.8 (9/11) - prior to TNA Triglycerides: wnl CBGs: controlled on SSI q4h and 20 units of regular insulin per 2L TNA bag   Plan:  At 1800 tonight  Change TNA to Clinimix E 5/20 @ goal rate of 70 ml/hr  TNA to contain IV fat emulsion 20% at 8 ml/hr daily  Standard multivitamins and trace elements daily  Continue SSI q4h and 10 units/L (total of 16.8 units/day) - monitor CBGs closely  Kphos 10 mmol IV x 1   Decrease IVF to 30 ml/hr  TNA labs Monday/Thursdays  Pharmacy will follow up daily  Geoffry Paradise, PharmD, BCPS Pager: [phone] 10:26 AM Pharmacy #: [id number]

## 2013-05-09 NOTE — Progress Notes (Signed)
Subjective: The patient is seen and examined today. His daughter and wife were at the bedside. He is feeling a little bit better today and has a bowel movement earlier today. He denied having any significant fever or chills. The patient denied having any nausea or vomiting. He was started on a few sips of liquid today. The cytology from the recent right pleural effusion was consistent with malignant adenocarcinoma of pancreatic origin.  Objective: Vital signs in last 24 hours: Temp:  [97.4 F (36.3 C)-98.3 F (36.8 C)] 97.9 F (36.6 C) (09/15 1339) Pulse Rate:  [90-102] 102 (09/15 1339) Resp:  [16-19] 16 (09/15 1339) BP: (113-139)/(65-72) 113/65 mmHg (09/15 1339) SpO2:  [100 %] 100 % (09/15 1339)  Intake/Output from previous day: 09/14 0701 - 09/15 0700 In: 2353 [P.O.:120; I.V.:490; IV Piggyback:356.7; TPN:1386.3] Out: 1500 [Urine:1150; Chest Tube:350] Intake/Output this shift: Total I/O In: -  Out: 601 [Urine:200; Stool:1; Chest Tube:400]  General appearance: alert, cooperative, fatigued and no distress Resp: diminished breath sounds RLL and dullness to percussion RLL Cardio: regular rate and rhythm, S1, S2 normal, no murmur, click, rub or gallop GI: soft, non-tender; bowel sounds normal; no masses,  no organomegaly Extremities: extremities normal, atraumatic, no cyanosis or edema  Lab Results:   Recent Labs  05/08/13 0525 05/09/13 0432  WBC 2.5* 3.5*  HGB 10.6* 10.6*  HCT 33.1* 32.4*  PLT 272 267   BMET  Recent Labs  05/08/13 0525 05/09/13 0432  NA 134* 132*  K 3.7 3.8  CL 100 99  CO2 28 27  GLUCOSE 167* 158*  BUN 8 12  CREATININE 0.53 0.43*  CALCIUM 7.9* 7.9*    Studies/Results: Dg Chest Port 1 View  05/08/2013   *RADIOLOGY REPORT*  Clinical Data: Postoperative evaluation chest tube  PORTABLE CHEST - 1 VIEW  Comparison: Prior chest x-ray 05/06/2013  Findings: Stable to slightly decreased right apical pneumothorax. Right thoracostomy tube remains in place.   Right IJ approach central venous catheter remains in unchanged position.  Nasogastric tube is present, the tip lies the visualized portion of the stomach.  Stable to slightly decreased right pleural effusion with associated right basilar atelectasis versus infiltrate left lower lobe.  Cardiac and mediastinal, unchanged.  Atherosclerotic calcifications  IMPRESSION: 1.  Stable to slightly decreased right apical pneumothorax. 2.  Position of support apparatus. 3.  Slightly decreased right pleural effusion with residual right lower lobe atelectasis versus infiltrate.   Original Report Authenticated By: Malachy Moan, M.D.   Dg Abd 2 Views  05/09/2013   CLINICAL DATA:  Recent small bowel obstruction  EXAM: ABDOMEN - 2 VIEW  COMPARISON:  May 08, 2013  FINDINGS: Supine and left lateral decubitus views were obtained. Nasogastric tube tip remains in the region of the proximal jejunum. Contrast is seen throughout the colon. The remaining loops of dilated small bowel with occasional air-fluid levels. There is no appreciable new bowel dilatation compared to recent prior study. No free air is appreciable. There are phleboliths in the pelvis.  The previously noted air-fluid level in the right upper quadrant remains. Its etiology is uncertain.  IMPRESSION: The previously noted air in the right upper quadrant which appears separate from bowel remains stable. This finding may warrant correlation with CT abdomen to further evaluate ; an abscess is a differential consideration for this air.  There remains mild bowel dilatation, stable from 1 day prior. Nasogastric tube position is unchanged. No free air is appreciated on the study.   Electronically Signed   By:  Bretta Bang   On: 05/09/2013 08:43   Dg Abd 2 Views  05/08/2013   *RADIOLOGY REPORT*  Clinical Data: Abdominal distension, follow-up small bowel obstruction  ABDOMEN - 2 VIEW  Comparison: 05/06/2013  Findings: NG tube in stable position with tip in the  expected position of distal jejunum or proximal jejunum and oral contrast is seen into the transverse colon with gas into the rectum.There is decreased gaseous distension of the small bowel consistent with resolving obstruction.  In the right upper quadrant on the decubitus view, there is a 4cm air-fluid level, not in anticipated position for bowel.  IMPRESSION: 1.  Improving bowel gas pattern with resolving small bowel distension.  2.  Focal 4cm air-fluid level right upper quadrant of uncertain origin.  A review of CT images 05/04/13 showed some air within the gallbladder and it is possible that the air fluid level may reflect this, although other causes would include abscess.  The finding does not appear to be related to bowel.  Follow-up with CT scanning of the abdomen suggested.   Original Report Authenticated By: Esperanza Heir, M.D.    Medications: I have reviewed the patient's current medications.  Assessment/Plan: 1) metastatic pancreatic adenocarcinoma with evidence for recent disease progression especially on the right pleural effusion: He recently progressed on systemic chemotherapy with FOLFIRI. I had a lengthy discussion with the patient and his family today about his current disease status and treatment options. I discussed with the patient referred back to [facility] in [place] for evaluation and consideration of clinical trial as the patient would be staying with his daughter in [place] for the next few weeks. I also had a lengthy discussion with the patient about consideration of palliative care and hospice referral if there is no other option for his treatment. Currently there is no standard chemotherapy options left for this patient but he may be a candidate for a clinical trial any is available at [place] or in [place]. The patient and his family agreed to the current plan. 2) right pleural effusion status post Pleurx catheter  placement. Continue drainage of an as-needed basis. 3) small bowel obstruction secondary to peritoneal carcinomatosis, slowly improving.    LOS: 6 days    Inigo Lantigua K. 05/09/2013

## 2013-05-09 NOTE — Progress Notes (Signed)
[facility] PROGRESS NOTE  MARDY LUCIER MVH:[id number] DOB: Dec 21, 1934 DOA: 05/03/2013 PCP: [name], MD  Assessment/Plan: SBO -Likely from progression of cancer. -Appreciate surgical input. -Has now had a few BMs. -Abd xray shows improving SBO. -NG has been clamped; diet has been advanced to clears today; if tolerates will DC NG in am and further advance diet. -On TNA as he has been without nutrition for about 6 days.  Large Exudative Right Pleural Effusion -S/p large volume thoracentesis on 9/9 of 2 L. -S/p pleurex catheter placement on 05/06/13. -Pleural fluid with malignant cells. -Do not feel that clinically or by imaging he has PNA, so will DC vanc/Cefepime.  Metastatic Pancreatic Cancer -Pleural fluid with malignant cells, CT with pleural and peritoneal nodules concerning for metastatic disease. -As per oncology.  AOCD -He is now s/p transfusion of 2 units of PRBCs with a Hb of 10.4.  Hypokalemia -From NG suction. -Repleted. -Recheck in am.  Code Status: Full Code Family Communication: Discussed with wife and daughter at bedside. Disposition Plan: Home when medically ready.    Consultants:  Surgery  Pulmonary  CVTS  Oncology   Antibiotics:  None  Subjective: Feels much better today, Less SOB/nausea and decreased abdominal pain. In good spirits. Passed kidney stone. Has had a few BMs.  Objective: Filed Vitals:   05/08/13 1356 05/08/13 2005 05/09/13 0416 05/09/13 1339  BP: 111/68 139/67 126/72 113/65  Pulse: 92 90 97 102  Temp: 98.2 F (36.8 C) 98.3 F (36.8 C) 97.4 F (36.3 C) 97.9 F (36.6 C)  TempSrc: Oral Oral Oral Oral  Resp: 20 19 18 16   Height:      Weight:      SpO2: 100% 100% 100% 100%    Intake/Output Summary (Last 24 hours) at 05/09/13 1447 Last data filed at 05/09/13 1300  Gross per 24 hour  Intake 2046.33 ml  Output   1751 ml  Net 295.33 ml   Filed Weights   05/03/13 1538 05/03/13 1841  Weight: 69.6 kg  (153 lb 7 oz) 65.4 kg (144 lb 2.9 oz)    Exam:   General:  AA Ox3  Cardiovascular: RRR, no M/R/G  Respiratory: decreased BS right base  Abdomen: S/ND/+BS  Extremities: 1+edema   Neurologic:  Non-focal.  Data Reviewed: Basic Metabolic Panel:  Recent Labs Lab 05/05/13 0430 05/05/13 1848 05/07/13 0550 05/08/13 0525 05/09/13 0432  NA 134* 133* 134* 134* 132*  K 3.0* 3.3* 3.5 3.7 3.8  CL 91* 95* 101 100 99  CO2 36* 35* 29 28 27   GLUCOSE 82 92 159* 167* 158*  BUN 29* 18 9 8 12   CREATININE 1.32 0.86 0.57 0.53 0.43*  CALCIUM 8.0* 7.8* 7.9* 7.9* 7.9*  MG  --   --  2.1 1.9 1.9  PHOS  --   --  1.8* 1.8* 2.0*   Liver Function Tests:  Recent Labs Lab 05/05/13 0430 05/05/13 1848 05/08/13 0525 05/09/13 0432  AST 17 14 15 19   ALT 12 11 9 8   ALKPHOS 176* 162* 140* 153*  BILITOT 0.6 0.5 0.7 0.6  PROT 5.8* 5.6* 5.0* 5.2*  ALBUMIN 2.0* 1.9* 1.7* 1.7*   No results found for this basename: LIPASE, AMYLASE,  in the last 168 hours No results found for this basename: AMMONIA,  in the last 168 hours CBC:  Recent Labs Lab 05/03/13 1230  05/05/13 0430 05/05/13 1848 05/07/13 0550 05/08/13 0525 05/09/13 0432  WBC 1.5*  1.5*  < > 2.1* 2.0* 1.9* 2.5* 3.5*  NEUTROABS 0.9*  --   --   --   --  0.9* 1.0*  HGB 9.2*  9.3*  < > 10.4* 10.3* 10.3* 10.6* 10.6*  HCT 27.7*  28.0*  < > 30.9* 30.4* 31.8* 33.1* 32.4*  MCV 87.9  87.8  < > 88.5 87.1 91.6 90.4 91.5  PLT 307  310  < > 245 266 227 272 267  < > = values in this interval not displayed. Cardiac Enzymes: No results found for this basename: CKTOTAL, CKMB, CKMBINDEX, TROPONINI,  in the last 168 hours BNP (last 3 results) No results found for this basename: PROBNP,  in the last 8760 hours CBG:  Recent Labs Lab 05/08/13 2004 05/09/13 0003 05/09/13 0355 05/09/13 0754 05/09/13 1151  GLUCAP 147* 137* 137* 147* 188*    Recent Results (from the past 240 hour(s))  BODY FLUID CULTURE     Status: None   Collection Time     05/03/13  7:01 PM      Result Value Range Status   Specimen Description THORACENTESIS   Final   Special Requests Immunocompromised   Final   Gram Stain     Final   Value: WBC PRESENT,BOTH PMN AND MONONUCLEAR     NO ORGANISMS SEEN     Performed by [facility]     Performed at [facility]   Culture     Final   Value: NO GROWTH 3 DAYS     Performed at [facility]   Report Status 05/07/2013 FINAL   Final  GRAM STAIN     Status: None   Collection Time    05/03/13  7:02 PM      Result Value Range Status   Specimen Description THORACENTESIS   Final   Special Requests Immunocompromised   Final   Gram Stain     Final   Value: CYTOSPIN     NO ORGANISMS SEEN     WBC PRESENT,BOTH PMN AND MONONUCLEAR     Gram Stain Report Called to,Read Back By and Verified With: S YOUNG RN 2048 05/03/13 A NAVARRO   Report Status 05/03/2013 FINAL   Final  SURGICAL PCR SCREEN     Status: None   Collection Time    05/05/13  9:48 PM      Result Value Range Status   MRSA, PCR NEGATIVE  NEGATIVE Final   Staphylococcus aureus NEGATIVE  NEGATIVE Final   Comment:            The Xpert SA Assay (FDA     approved for NASAL specimens     in patients over 76 years of age),     is one component of     a comprehensive surveillance     program.  Test performance has     been validated by [employer] for patients greater     than or equal to 76 year old.     It is not intended     to diagnose infection nor to     guide or monitor treatment.     Studies: Dg Chest Port 1 View  05/08/2013   *RADIOLOGY REPORT*  Clinical Data: Postoperative evaluation chest tube  PORTABLE CHEST - 1 VIEW  Comparison: Prior chest x-ray 05/06/2013  Findings: Stable to slightly decreased right apical pneumothorax. Right thoracostomy tube remains in place.  Right IJ approach central venous catheter remains in unchanged position.  Nasogastric tube is present, the tip lies  the visualized portion of the stomach.   Stable to slightly decreased right pleural effusion with associated right basilar atelectasis versus infiltrate left lower lobe.  Cardiac and mediastinal, unchanged.  Atherosclerotic calcifications  IMPRESSION: 1.  Stable to slightly decreased right apical pneumothorax. 2.  Position of support apparatus. 3.  Slightly decreased right pleural effusion with residual right lower lobe atelectasis versus infiltrate.   Original Report Authenticated By: Malachy Moan, M.D.   Dg Abd 2 Views  05/09/2013   CLINICAL DATA:  Recent small bowel obstruction  EXAM: ABDOMEN - 2 VIEW  COMPARISON:  May 08, 2013  FINDINGS: Supine and left lateral decubitus views were obtained. Nasogastric tube tip remains in the region of the proximal jejunum. Contrast is seen throughout the colon. The remaining loops of dilated small bowel with occasional air-fluid levels. There is no appreciable new bowel dilatation compared to recent prior study. No free air is appreciable. There are phleboliths in the pelvis.  The previously noted air-fluid level in the right upper quadrant remains. Its etiology is uncertain.  IMPRESSION: The previously noted air in the right upper quadrant which appears separate from bowel remains stable. This finding may warrant correlation with CT abdomen to further evaluate ; an abscess is a differential consideration for this air.  There remains mild bowel dilatation, stable from 1 day prior. Nasogastric tube position is unchanged. No free air is appreciated on the study.   Electronically Signed   By: Bretta Bang   On: 05/09/2013 08:43   Dg Abd 2 Views  05/08/2013   *RADIOLOGY REPORT*  Clinical Data: Abdominal distension, follow-up small bowel obstruction  ABDOMEN - 2 VIEW  Comparison: 05/06/2013  Findings: NG tube in stable position with tip in the expected position of distal jejunum or proximal jejunum and oral contrast is seen into the transverse colon with gas into the rectum.There is decreased gaseous  distension of the small bowel consistent with resolving obstruction.  In the right upper quadrant on the decubitus view, there is a 4cm air-fluid level, not in anticipated position for bowel.  IMPRESSION: 1.  Improving bowel gas pattern with resolving small bowel distension.  2.  Focal 4cm air-fluid level right upper quadrant of uncertain origin.  A review of CT images 05/04/13 showed some air within the gallbladder and it is possible that the air fluid level may reflect this, although other causes would include abscess.  The finding does not appear to be related to bowel.  Follow-up with CT scanning of the abdomen suggested.   Original Report Authenticated By: Esperanza Heir, M.D.    Scheduled Meds: . enoxaparin (LOVENOX) injection  40 mg Subcutaneous Q24H  . famotidine (PEPCID) IV  20 mg Intravenous Q12H  . insulin aspart  0-9 Units Subcutaneous Q4H  . potassium phosphate IVPB (mmol)  10 mmol Intravenous Once  . sodium chloride  3 mL Intravenous Q12H   Continuous Infusions: . sodium chloride 40 mL/hr at 05/08/13 1800  . sodium chloride    . Marland KitchenTPN (CLINIMIX-E) Adult     And  . fat emulsion    . Marland KitchenTPN (CLINIMIX-E) Adult 60 mL/hr at 05/08/13 1720   And  . fat emulsion 250 mL (05/08/13 1720)    Principal Problem:   SBO (small bowel obstruction) Active Problems:   Malignant pleural effusion   Malnutrition of moderate degree   Hypokalemia   Anemia, chronic disease    Time spent: 35 minutes.    Chaya Jan  [facility] Pager [phone]  If  7PM-7AM, please contact night-coverage at [email or web address], password [facility] 05/09/2013, 2:47 PM  LOS: 6 days

## 2013-05-10 ENCOUNTER — Inpatient Hospital Stay (HOSPITAL_COMMUNITY): Payer: Medicare Other

## 2013-05-10 ENCOUNTER — Other Ambulatory Visit (HOSPITAL_COMMUNITY): Payer: Medicare Other

## 2013-05-10 ENCOUNTER — Encounter (HOSPITAL_COMMUNITY): Payer: Self-pay | Admitting: Cardiothoracic Surgery

## 2013-05-10 LAB — CREATININE, SERUM
Creatinine, Ser: 0.46 mg/dL — ABNORMAL LOW (ref 0.50–1.35)
GFR calc Af Amer: >90 mL/min (ref >90)
GFR calc non Af Amer: >90 mL/min (ref >90)

## 2013-05-10 LAB — GLUCOSE, CAPILLARY
Glucose-Capillary: 183 mg/dL — ABNORMAL HIGH (ref 70–99)
Glucose-Capillary: 162 mg/dL — ABNORMAL HIGH (ref 70–99)

## 2013-05-10 MED ORDER — TERAZOSIN HCL 2 MG PO CAPS
2.0000 mg | ORAL_CAPSULE | Freq: Every day | ORAL | Status: DC
  Filled 2013-05-10 (×2): qty 1

## 2013-05-10 MED ORDER — POLYETHYLENE GLYCOL 3350 17 G PO PACK
17.0000 g | PACK | Freq: Every day | ORAL | Status: DC
  Administered 2013-05-11: 10:00:00 17 g via ORAL
  Filled 2013-05-10 (×2): qty 1

## 2013-05-10 MED ORDER — FAT EMULSION 20 % IV EMUL
192.0000 mL | INTRAVENOUS | Status: DC
  Administered 2013-05-10: 192 mL via INTRAVENOUS
  Filled 2013-05-10: qty 200

## 2013-05-10 MED ORDER — HYDROMORPHONE HCL PF 2 MG/ML IJ SOLN
2.0000 mg | Freq: Once | INTRAMUSCULAR | Status: AC
  Administered 2013-05-10: 2 mg via INTRAVENOUS
  Filled 2013-05-10: qty 1

## 2013-05-10 MED ORDER — TRACE MINERALS CR-CU-F-FE-I-MN-MO-SE-ZN IV SOLN
INTRAVENOUS | Status: DC
  Administered 2013-05-10: 18:00:00 via INTRAVENOUS
  Filled 2013-05-10: qty 2000

## 2013-05-10 MED ORDER — MIRTAZAPINE 7.5 MG PO TABS
7.5000 mg | ORAL_TABLET | Freq: Every day | ORAL | Status: DC
  Administered 2013-05-10: 7.5 mg via ORAL
  Filled 2013-05-10 (×2): qty 1

## 2013-05-10 MED ORDER — OXYCODONE HCL ER 10 MG PO T12A: 10.0000 mg | EXTENDED_RELEASE_TABLET | Freq: Two times a day (BID) | ORAL | Status: DC

## 2013-05-10 MED ORDER — GABAPENTIN 100 MG PO CAPS
100.0000 mg | ORAL_CAPSULE | Freq: Two times a day (BID) | ORAL | Status: DC
  Administered 2013-05-10 – 2013-05-11 (×2): 100 mg via ORAL
  Filled 2013-05-10 (×3): qty 1

## 2013-05-10 MED ORDER — OXYCODONE HCL ER 10 MG PO T12A
10.0000 mg | EXTENDED_RELEASE_TABLET | Freq: Two times a day (BID) | ORAL | Status: DC
  Administered 2013-05-10 – 2013-05-11 (×3): 10 mg via ORAL
  Filled 2013-05-10 (×3): qty 1

## 2013-05-10 MED ORDER — BOOST / RESOURCE BREEZE PO LIQD
1.0000 | ORAL | Status: DC
  Administered 2013-05-10 – 2013-05-11 (×2): 1 via ORAL

## 2013-05-10 MED ORDER — TAMSULOSIN HCL 0.4 MG PO CAPS
0.4000 mg | ORAL_CAPSULE | Freq: Two times a day (BID) | ORAL | Status: DC
  Administered 2013-05-10 – 2013-05-11 (×2): 0.4 mg via ORAL
  Filled 2013-05-10 (×3): qty 1

## 2013-05-10 MED ORDER — OXYCODONE HCL 5 MG PO TABS
5.0000 mg | ORAL_TABLET | ORAL | Status: DC | PRN
  Administered 2013-05-10 – 2013-05-11 (×3): 5 mg via ORAL
  Filled 2013-05-10 (×4): qty 1

## 2013-05-10 NOTE — Progress Notes (Addendum)
NUTRITION FOLLOW UP  Intervention:   Add Resource Breeze po daily, each supplement provides 250 kcal and 9 grams of protein. Once pt advances to full liquid diet - allow daughter/family member to make protein shakes for patient. (Patient consumes 9 protein shakes daily, with a minimum protein content of 108 grams daily.) If patient is able to tolerate his shakes, recommend weaning of TPN. RD to continue to follow nutrition care plan  Nutrition Dx:   Inadequate oral intake now related to poor appetite as evidenced by need for slow diet progression.  Goal:   Pt to meet >/= 90% of their estimated nutrition needs; not met  Monitor:   TPN, weight trends, I/O's, labs, diet advancement  Assessment:   78 y.o. male with prior h/o metastatic pancreatic cancer , hypertension, DM, was brought in by family for persistent nausea and vomiting. After arrival to ED, he was found hypoxic, dyspneic, and his oxygen sats were in low 80's. His CXR revealed large right pleural effusion. He also reports left quadrant abdominal pain and adbominal distention. His abdominal films show mod degree of SBO.  Per MD note, SBO likely from progression of his cancer. The cytology from the recent right pleural effusion was consistent with malignant adenocarcinoma of pancreatic origin.  Discussed patient during progression rounds.  NGT discontinued this morning, per RN. Currently on Clear Liquids, planning to advance to full liquids soon per surgery. Pt was consuming 9 protein shakes daily PTA. RD reviewed nutritional content of protein powder, based on recipe, pt consumes at least 108 grams of protein daily from these shakes; calorie content varies with ingredients. Does not like Ensure or Boost. Agreeable to trying Resource Breeze while on Clear Liquids.  Patient is receiving TPN with Clinimix E 5/20 @ 70 ml/hr and lipids @ 8 ml/hr. Provides 1862 kcal, and 84 grams protein per day. Meets 95% minimum estimated energy needs and  100% minimum estimated protein needs.  TPN labs reviewed: sodium is low, potassium and magnesium is normal, phos low. Trig WNL.   Height: Ht Readings from Last 1 Encounters:  05/03/13 5\' 7"  (1.702 m)    Weight Status:   Wt Readings from Last 1 Encounters:  05/03/13 144 lb 2.9 oz (65.4 kg)  Admit wt 153 lb.  Re-estimated needs:  Kcal: 1950 - 2200 Protein: 78 - 91 g Fluid: 1.7-1.9 L/day  Skin: incision on chest  Diet Order: Clear Liquid   Intake/Output Summary (Last 24 hours) at 05/10/13 0931 Last data filed at 05/10/13 0900  Gross per 24 hour  Intake 3071.66 ml  Output   1080 ml  Net 1991.66 ml    Last BM: 9/16   Labs:   Recent Labs Lab 05/07/13 0550 05/08/13 0525 05/09/13 0432 05/10/13 0510  NA 134* 134* 132*  --   K 3.5 3.7 3.8  --   CL 101 100 99  --   CO2 29 28 27   --   BUN 9 8 12   --   CREATININE 0.57 0.53 0.43* 0.46*  CALCIUM 7.9* 7.9* 7.9*  --   MG 2.1 1.9 1.9  --   PHOS 1.8* 1.8* 2.0*  --   GLUCOSE 159* 167* 158*  --     CBG (last 3)   Recent Labs  05/09/13 2358 05/10/13 0358 05/10/13 0731  GLUCAP 194* 183* 162*   Triglycerides  Date/Time Value Range Status  05/09/2013  4:32 AM 72  <150 mg/dL Final     Performed at [facility]  Prealbumin  Date/Time Value Range Status  05/09/2013  4:32 AM 4.1* 17.0 - 34.0 mg/dL Final     Performed at [facility]    Scheduled Meds: . enoxaparin (LOVENOX) injection  40 mg Subcutaneous Q24H  . famotidine (PEPCID) IV  20 mg Intravenous Q12H  . insulin aspart  0-9 Units Subcutaneous Q4H  . sodium chloride  3 mL Intravenous Q12H    Continuous Infusions: . sodium chloride 1 mL (05/09/13 1708)  . Marland KitchenTPN (CLINIMIX-E) Adult 70 mL/hr at 05/09/13 1716   And  . fat emulsion 192 mL (05/09/13 1716)    Jarold Motto MS, RD, LDN Pager: [phone] After-hours pager: [phone]

## 2013-05-10 NOTE — Progress Notes (Addendum)
PARENTERAL NUTRITION CONSULT NOTE - FOLLOW UP  Pharmacy Consult for TNA Indication: SBO  Allergies  Allergen Reactions  . Tape     Plastic and silk tape cause skin rash    Patient Measurements: Height: 5\' 7"  (170.2 cm) Weight: 144 lb 2.9 oz (65.4 kg) IBW/kg (Calculated) : 66.1 Adjusted Body Weight: 66.1  Vital Signs:   Intake/Output from previous day: 09/15 0701 - 09/16 0700 In: 2831.7 [I.V.:736; IV Piggyback:353.3; TPN:1742.3] Out: 1281 [Urine:880; Stool:1; Chest Tube:400] Intake/Output from this shift: Total I/O In: 240 [P.O.:240] Out: -   Labs:  Recent Labs  05/08/13 0525 05/09/13 0432  WBC 2.5* 3.5*  HGB 10.6* 10.6*  HCT 33.1* 32.4*  PLT 272 267     Recent Labs  05/08/13 0525 05/09/13 0432 05/10/13 0510  NA 134* 132*  --   K 3.7 3.8  --   CL 100 99  --   CO2 28 27  --   GLUCOSE 167* 158*  --   BUN 8 12  --   CREATININE 0.53 0.43* 0.46*  CALCIUM 7.9* 7.9*  --   MG 1.9 1.9  --   PHOS 1.8* 2.0*  --   PROT 5.0* 5.2*  --   ALBUMIN 1.7* 1.7*  --   AST 15 19  --   ALT 9 8  --   ALKPHOS 140* 153*  --   BILITOT 0.7 0.6  --   PREALBUMIN  --  4.1*  --   TRIG 83 72  --    Estimated Creatinine Clearance: 72.7 ml/min (by C-G formula based on Cr of 0.46).    Recent Labs  05/09/13 2358 05/10/13 0358 05/10/13 0731  GLUCAP 194* 183* 162*   CBGs & Insulin requirements past 24 hours:  - CBGs 160-180, required 13 units SSI  Assessment:  42 YOM with h/o metastatic pancreatic cancer admitted with abd pain and SBO. Pt without nutritional orally for ~1 week and MD ordered to start TPN 9/13.  Surgery on board - no plans for immediate surgery.   Today, 9/16, TNA day# 4 via R chest implanted port. Abd X-ray with improving SBO, previously noted air-fluid in RUQ remains (may need CT to r/o abscess if clinically suspicious). Diet advanced to clear liquid, hopefully to full liquids this afternoon. NG out.  Nutritional Goals:  - RD recs 9/14: 1700-1900 Kcal,  80-90g protein - Clinimix E 5/20 @ goal rate of 70 ml/hr + IVF 20% at 8 ml/hr will provide: 84 g protein, 1862 Kcal per day   Current nutrition:  - Diet: Clear liquid - TNA: Clinimix E 5/20 @ 70 ml/hr and IVFE 20% at 8 ml/hr - mIVF: NS @ 30 ml/hr  - Starting [employer] 9/16  Labs: No new labs 9/16 Electrolytes: Na+ low/stable (unable to adjust in Clinimix bag), phos = 2, other lytes wnl Renal Function: Scr trending down, UOP: 0.7 ml/kg/hr Hepatic Function: alk phos improving (153), AST/ALT wnl Pre-Albumin: 5.8 (9/11) - prior to TNA Triglycerides: wnl CBGs: CBGs slightly elevated on SSI q4h and 20 units of regular insulin per 2L TNA bag - will increase regular insulin slightly today  Plan:  At 1800 tonight  Change TNA to Clinimix E 5/20 @ goal rate of 70 ml/hr  TNA to contain IV fat emulsion 20% at 8 ml/hr daily  Standard multivitamins and trace elements daily  Continue SSI q4h and increase regular insulin to 25 units per 2 L bag (total of 21 units/day) - monitor CBGs closely  Continue IVF to 30 ml/hr  TNA labs Monday/Thursdays  Pharmacy will follow up daily - hoping to wean off TNA soon  Geoffry Paradise, PharmD, BCPS Pager: [phone] 9:55 AM Pharmacy #: [phone]

## 2013-05-10 NOTE — Progress Notes (Signed)
Seen, agree with above. Move to full liquids later today.  Hopefully home tomorrow.

## 2013-05-10 NOTE — Progress Notes (Addendum)
4 Days Post-Op  Subjective: He had good BM yesterday, on sips of clears, he was on protein shakes at home.  Breathing also much improved.  Objective: Vital signs in last 24 hours: Temp:  [97.9 F (36.6 C)-98 F (36.7 C)] 98 F (36.7 C) Jun 02, 2023 2037) Pulse Rate:  [98-102] 98 June 02, 2023 2037) Resp:  [16-18] 18 06/02/2023 2037) BP: (113-130)/(65-70) 130/70 mmHg 06/02/23 2037) SpO2:  [100 %] 100 % 06-02-2023 2037) Last BM Date: 06/01/13 2 bm's recorded yesterday and one this AM Afebrile, VSS, HR is up K+ 3.8 yesterday  Intake/Output from previous day: 2023/06/02 0701 - 09/16 0700 In: 2831.7 [I.V.:736; IV Piggyback:353.3; TPN:1742.3] Out: 1281 [Urine:880; Stool:1; Chest Tube:400] Intake/Output this shift:    General appearance: alert, cooperative and no distress Resp: clear to auscultation bilaterally and sounds much better than before. GI: soft, +BS much less distended. Non tender.  Lab Results:   Recent Labs  05/08/13 0525 Jun 01, 2013 0432  WBC 2.5* 3.5*  HGB 10.6* 10.6*  HCT 33.1* 32.4*  PLT 272 267    BMET  Recent Labs  05/08/13 0525 June 01, 2013 0432 05/10/13 0510  NA 134* 132*  --   K 3.7 3.8  --   CL 100 99  --   CO2 28 27  --   GLUCOSE 167* 158*  --   BUN 8 12  --   CREATININE 0.53 0.43* 0.46*  CALCIUM 7.9* 7.9*  --    PT/INR No results found for this basename: LABPROT, INR,  in the last 72 hours   Recent Labs Lab 05/05/13 0430 05/05/13 1848 05/08/13 0525 June 01, 2013 0432  AST 17 14 15 19   ALT 12 11 9 8   ALKPHOS 176* 162* 140* 153*  BILITOT 0.6 0.5 0.7 0.6  PROT 5.8* 5.6* 5.0* 5.2*  ALBUMIN 2.0* 1.9* 1.7* 1.7*     Lipase  No results found for this basename: lipase     Studies/Results: Dg Abd 2 Views  06/01/2013   CLINICAL DATA:  Recent small bowel obstruction  EXAM: ABDOMEN - 2 VIEW  COMPARISON:  May 08, 2013  FINDINGS: Supine and left lateral decubitus views were obtained. Nasogastric tube tip remains in the region of the proximal jejunum. Contrast is  seen throughout the colon. The remaining loops of dilated small bowel with occasional air-fluid levels. There is no appreciable new bowel dilatation compared to recent prior study. No free air is appreciable. There are phleboliths in the pelvis.  The previously noted air-fluid level in the right upper quadrant remains. Its etiology is uncertain.  IMPRESSION: The previously noted air in the right upper quadrant which appears separate from bowel remains stable. This finding may warrant correlation with CT abdomen to further evaluate ; an abscess is a differential consideration for this air.  There remains mild bowel dilatation, stable from 1 day prior. Nasogastric tube position is unchanged. No free air is appreciated on the study.   Electronically Signed   By: Bretta Bang   On: 06-01-13 08:43    Medications: . enoxaparin (LOVENOX) injection  40 mg Subcutaneous Q24H  . famotidine (PEPCID) IV  20 mg Intravenous Q12H  . insulin aspart  0-9 Units Subcutaneous Q4H  . sodium chloride  3 mL Intravenous Q12H    Assessment/Plan 1. SBO with pancreatic cancer Stage IIIB T3 N1M0, s/p Distal pancreectomy and spleenectomy 06/2010.  2. On going Chemotherapy  3.AODM s/p pancreatectomy  4.Hypertension  5. Hypokalemia (K+ up to 3.8)  6. Large right Pleural effusiMALIGNANT CELLS CONSISTENT WITH  METASTATIC ADENOCARCINOMA.  S/p INSERTION PLEURAL DRAINAGE CATHETER(Right) With Ultrasound guidance and Fluro, 05/06/13.  Nephrolithiasis passed a stone   Plan:  D/c NG  Clears and then advance back to full liquids.  He was on 9 protein shakes per day at home prior to admit.  We should have him back to baseline soon.  I would work to keep K+ up to 4.0 range. Family ask to restart hydrocodone and I will restart the below listed doses. Start him on Miralax also.    Prior to Admission medications   Medication Sig Start Date End Date Taking? Authorizing Provider  Alum & Mag Hydroxide-Simeth (MAGIC MOUTHWASH) SOLN  Take 5 mLs by mouth QID. 01/21/13  Yes Si Gaul, MD  aspirin 81 MG tablet Take 81 mg by mouth every morning.    Yes Historical Provider, MD  gabapentin (NEURONTIN) 100 MG capsule Take 100 mg by mouth 2 (two) times daily.  10/18/12  Yes Adrena E Johnson, PA-C  insulin glargine (LANTUS) 100 UNIT/ML injection Inject 24 Units into the skin at bedtime.   Yes Historical Provider, MD  lidocaine (XYLOCAINE) 2 % solution TAKE BY MOUTH FOUR TIMES DAILY 01/21/13  Yes Si Gaul, MD  lidocaine-prilocaine (EMLA) cream Apply 1 application topically as needed. 04/26/13  Yes Si Gaul, MD  mirtazapine (REMERON) 30 MG tablet TAKE 1 TABLET BY MOUTH EVERY NIGHT AT BEDTIME 04/26/13  Yes Si Gaul, MD  ondansetron (ZOFRAN) 8 MG tablet Take 1 tablet (8 mg total) by mouth every 12 (twelve) hours as needed for nausea. 04/26/13  Yes Si Gaul, MD  oxyCODONE (OXY IR/ROXICODONE) 5 MG immediate release tablet Take 1 tablet (5 mg total) by mouth every 4 (four) hours as needed. 10/13/11  Yes Si Gaul, MD  oxyCODONE (OXYCONTIN) 10 MG 12 hr tablet Take 1 tablet (10 mg total) by mouth every 12 (twelve) hours. 02/14/13-pt states he only takes one /day 04/04/13  Yes Adrena E Johnson, PA-C  ranitidine (ZANTAC) 150 MG tablet Take 150 mg by mouth 2 (two) times daily.  05/22/11  Yes Historical Provider, MD  tamsulosin (FLOMAX) 0.4 MG CAPS Take 0.4 mg by mouth 2 (two) times daily.   Yes Historical Provider, MD  terazosin (HYTRIN) 2 MG capsule Take 2 mg by mouth at bedtime.  03/22/13  Yes Historical Provider, MD     LOS: 7 days    Dynisha Due 05/10/2013

## 2013-05-10 NOTE — Progress Notes (Signed)
[facility] PROGRESS NOTE  Philip Shaw XBJ:[id number] DOB: [birth year] DOA: 05/03/2013 PCP: [name], MD  Assessment/Plan: SBO -Likely from progression of cancer. -Appreciate surgical input. -Has now had a few BMs. -Abd xray shows improving SBO. -NG has been discontinued today. -Tolerated clears. Will be advanced to his protein shakes today (only thing he eats at home). -If tolerates feeds today, can DC TNA in am.  Large Exudative Right Pleural Effusion -S/p large volume thoracentesis on 9/9 of 2 L. -S/p pleurex catheter placement on 05/06/13. -Pleural fluid with malignant cells. -Do not feel that clinically or by imaging he has PNA, so will DC vanc/Cefepime.  Metastatic Pancreatic Cancer -Pleural fluid with malignant cells, CT with pleural and peritoneal nodules concerning for metastatic disease. -As per oncology.  AOCD -He is now s/p transfusion of 2 units of PRBCs with a Hb of 10.4.  Hypokalemia -From NG suction. -Repleted. -Recheck in am.  Code Status: Full Code Family Communication: Discussed with wife and daughter at bedside. Disposition Plan: Home when medically ready. Anticipate in 24 hours.   Consultants:  Surgery  Pulmonary  CVTS  Oncology   Antibiotics:  None  Subjective: Feels much better today, Less SOB/nausea and decreased abdominal pain. In good spirits. Passed kidney stone. Has had a few BMs.  Objective: Filed Vitals:   05/09/13 0416 05/09/13 1339 05/09/13 2037 05/10/13 1330  BP: 126/72 113/65 130/70 108/50  Pulse: 97 102 98 103  Temp: 97.4 F (36.3 C) 97.9 F (36.6 C) 98 F (36.7 C) 97.8 F (36.6 C)  TempSrc: Oral Oral Oral Oral  Resp: 18 16 18 20   Height:      Weight:      SpO2: 100% 100% 100% 94%    Intake/Output Summary (Last 24 hours) at 05/10/13 1552 Last data filed at 05/10/13 1330  Gross per 24 hour  Intake 2063.33 ml  Output   1030 ml  Net 1033.33 ml   Filed Weights   05/03/13 1538 05/03/13 1841   Weight: 69.6 kg (153 lb 7 oz) 65.4 kg (144 lb 2.9 oz)    Exam:   General:  AA Ox3  Cardiovascular: RRR, no M/R/G  Respiratory: decreased BS right base  Abdomen: S/ND/+BS  Extremities: 1+edema   Neurologic:  Non-focal.  Data Reviewed: Basic Metabolic Panel:  Recent Labs Lab 05/05/13 0430 05/05/13 1848 05/07/13 0550 05/08/13 0525 05/09/13 0432 05/10/13 0510  NA 134* 133* 134* 134* 132*  --   K 3.0* 3.3* 3.5 3.7 3.8  --   CL 91* 95* 101 100 99  --   CO2 36* 35* 29 28 27   --   GLUCOSE 82 92 159* 167* 158*  --   BUN 29* 18 9 8 12   --   CREATININE 1.32 0.86 0.57 0.53 0.43* 0.46*  CALCIUM 8.0* 7.8* 7.9* 7.9* 7.9*  --   MG  --   --  2.1 1.9 1.9  --   PHOS  --   --  1.8* 1.8* 2.0*  --    Liver Function Tests:  Recent Labs Lab 05/05/13 0430 05/05/13 1848 05/08/13 0525 05/09/13 0432  AST 17 14 15 19   ALT 12 11 9 8   ALKPHOS 176* 162* 140* 153*  BILITOT 0.6 0.5 0.7 0.6  PROT 5.8* 5.6* 5.0* 5.2*  ALBUMIN 2.0* 1.9* 1.7* 1.7*   No results found for this basename: LIPASE, AMYLASE,  in the last 168 hours No results found for this basename: AMMONIA,  in the last 168 hours CBC:  Recent Labs Lab 05/05/13 0430 05/05/13 1848 05/07/13 0550 05/08/13 0525 05/09/13 0432  WBC 2.1* 2.0* 1.9* 2.5* 3.5*  NEUTROABS  --   --   --  0.9* 1.0*  HGB 10.4* 10.3* 10.3* 10.6* 10.6*  HCT 30.9* 30.4* 31.8* 33.1* 32.4*  MCV 88.5 87.1 91.6 90.4 91.5  PLT 245 266 227 272 267   Cardiac Enzymes: No results found for this basename: CKTOTAL, CKMB, CKMBINDEX, TROPONINI,  in the last 168 hours BNP (last 3 results) No results found for this basename: PROBNP,  in the last 8760 hours CBG:  Recent Labs Lab 05/09/13 2035 05/09/13 2358 05/10/13 0358 05/10/13 0731 05/10/13 1105  GLUCAP 179* 194* 183* 162* 183*    Recent Results (from the past 240 hour(s))  BODY FLUID CULTURE     Status: None   Collection Time    05/03/13  7:01 PM      Result Value Range Status   Specimen  Description THORACENTESIS   Final   Special Requests Immunocompromised   Final   Gram Stain     Final   Value: WBC PRESENT,BOTH PMN AND MONONUCLEAR     NO ORGANISMS SEEN     Performed by [facility]     Performed at [facility]   Culture     Final   Value: NO GROWTH 3 DAYS     Performed at [facility]   Report Status 05/07/2013 FINAL   Final  GRAM STAIN     Status: None   Collection Time    05/03/13  7:02 PM      Result Value Range Status   Specimen Description THORACENTESIS   Final   Special Requests Immunocompromised   Final   Gram Stain     Final   Value: CYTOSPIN     NO ORGANISMS SEEN     WBC PRESENT,BOTH PMN AND MONONUCLEAR     Gram Stain Report Called to,Read Back By and Verified With: S YOUNG RN 2048 05/03/13 A NAVARRO   Report Status 05/03/2013 FINAL   Final  SURGICAL PCR SCREEN     Status: None   Collection Time    05/05/13  9:48 PM      Result Value Range Status   MRSA, PCR NEGATIVE  NEGATIVE Final   Staphylococcus aureus NEGATIVE  NEGATIVE Final   Comment:            The Xpert SA Assay (FDA     approved for NASAL specimens     in patients over 43 years of age),     is one component of     a comprehensive surveillance     program.  Test performance has     been validated by [employer] for patients greater     than or equal to 83 year old.     It is not intended     to diagnose infection nor to     guide or monitor treatment.     Studies: Dg Chest Port 1 View  05/10/2013   CLINICAL DATA:  Lung cancer and malignant right pleural effusion.  EXAM: PORTABLE CHEST - 1 VIEW  COMPARISON:  05/08/2013  FINDINGS: Mildly improved aeration at the right lung base. Stable positioning of pleural drainage catheter. Stable positioning of Port-A-Cath and nasogastric tube. No pulmonary edema is identified. The heart size is stable and within normal limits. No further pneumothorax visualized.  IMPRESSION: Mildly improved aeration of the right  lung  base. No further right pneumothorax.   Electronically Signed   By: Irish Lack   On: 05/10/2013 07:38   Dg Abd 2 Views  05/09/2013   CLINICAL DATA:  Recent small bowel obstruction  EXAM: ABDOMEN - 2 VIEW  COMPARISON:  May 08, 2013  FINDINGS: Supine and left lateral decubitus views were obtained. Nasogastric tube tip remains in the region of the proximal jejunum. Contrast is seen throughout the colon. The remaining loops of dilated small bowel with occasional air-fluid levels. There is no appreciable new bowel dilatation compared to recent prior study. No free air is appreciable. There are phleboliths in the pelvis.  The previously noted air-fluid level in the right upper quadrant remains. Its etiology is uncertain.  IMPRESSION: The previously noted air in the right upper quadrant which appears separate from bowel remains stable. This finding may warrant correlation with CT abdomen to further evaluate ; an abscess is a differential consideration for this air.  There remains mild bowel dilatation, stable from 1 day prior. Nasogastric tube position is unchanged. No free air is appreciated on the study.   Electronically Signed   By: Bretta Bang   On: 05/09/2013 08:43    Scheduled Meds: . enoxaparin (LOVENOX) injection  40 mg Subcutaneous Q24H  . famotidine (PEPCID) IV  20 mg Intravenous Q12H  . feeding supplement  1 Container Oral Q24H  . insulin aspart  0-9 Units Subcutaneous Q4H  . OxyCODONE  10 mg Oral Q12H  . polyethylene glycol  17 g Oral Daily  . sodium chloride  3 mL Intravenous Q12H   Continuous Infusions: . sodium chloride 1 mL (05/09/13 1708)  . Marland KitchenTPN (CLINIMIX-E) Adult 70 mL/hr at 05/09/13 1716   And  . fat emulsion 192 mL (05/09/13 1716)  . Marland KitchenTPN (CLINIMIX-E) Adult     And  . fat emulsion      Principal Problem:   SBO (small bowel obstruction) Active Problems:   Malignant pleural effusion   Malnutrition of moderate degree   Hypokalemia   Anemia, chronic  disease    Time spent: 35 minutes.    Chaya Jan  [facility] Pager [phone]  If 7PM-7AM, please contact night-coverage at [email or web address], password [facility] 05/10/2013, 3:52 PM  LOS: 7 days

## 2013-05-10 NOTE — Progress Notes (Signed)
Seen, agree with above.  Pain control. Move diet toward full liquid.  Hope to d/c tomorrow.

## 2013-05-11 LAB — BASIC METABOLIC PANEL
Chloride: 96 mEq/L (ref 96–112)
GFR calc Af Amer: >90 mL/min (ref >90)
Potassium: 4.2 mEq/L (ref 3.5–5.1)
Sodium: 130 mEq/L — ABNORMAL LOW (ref 135–145)

## 2013-05-11 LAB — CBC
HCT: 36.6 % — ABNORMAL LOW (ref 39.0–52.0)
Hemoglobin: 11.7 g/dL — ABNORMAL LOW (ref 13.0–17.0)
RDW: 17.8 % — ABNORMAL HIGH (ref 11.5–15.5)
WBC: 6.2 10*3/uL (ref 4.0–10.5)

## 2013-05-11 LAB — GLUCOSE, CAPILLARY
Glucose-Capillary: 187 mg/dL — ABNORMAL HIGH (ref 70–99)
Glucose-Capillary: 192 mg/dL — ABNORMAL HIGH (ref 70–99)
Glucose-Capillary: 210 mg/dL — ABNORMAL HIGH (ref 70–99)

## 2013-05-11 MED ORDER — TRACE MINERALS CR-CU-F-FE-I-MN-MO-SE-ZN IV SOLN
INTRAVENOUS | Status: DC
  Filled 2013-05-11: qty 2000

## 2013-05-11 MED ORDER — INSULIN GLARGINE 100 UNIT/ML ~~LOC~~ SOLN: 5.0000 [IU] | Freq: Every day | SUBCUTANEOUS | Status: AC

## 2013-05-11 MED ORDER — TERAZOSIN HCL 2 MG PO CAPS: 2.0000 mg | ORAL_CAPSULE | Freq: Every day | ORAL | Status: AC

## 2013-05-11 MED ORDER — OXYCODONE HCL 5 MG PO TABS: 5.0000 mg | ORAL_TABLET | ORAL | Status: AC | PRN

## 2013-05-11 MED ORDER — FAT EMULSION 20 % IV EMUL
192.0000 mL | INTRAVENOUS | Status: DC
  Filled 2013-05-11: qty 200

## 2013-05-11 NOTE — Progress Notes (Signed)
5 Days Post-Op  Subjective: Tolerating full liquids, has some loose stools with his flatus, at least one solid stool yesterday. Complains of pain primarily in his back.  Objective: Vital signs in last 24 hours: Temp:  [97.8 F (36.6 C)-97.9 F (36.6 C)] 97.8 F (36.6 C) (09/17 0432) Pulse Rate:  [103-113] 113 (09/17 0432) Resp:  [19-20] 19 (09/17 0432) BP: (108-115)/(50-73) 115/73 mmHg (09/17 0432) SpO2:  [94 %-96 %] 96 % (09/17 0432) Last BM Date: 05/10/13 840 PO recorded, 1 BM recorded Afebrile, still a little tachycardic on O2 Labs OK Na is still a little low  Intake/Output from previous day: 09/16 0701 - 09/17 0700 In: 3272 [P.O.:840; I.V.:720; IV Piggyback:50; TPN:1662] Out: 1200 [Urine:850; Chest Tube:350] Intake/Output this shift: Total I/O In: 120 [P.O.:120] Out: 150 [Urine:150]  General appearance: alert, cooperative, no distress and he  has back pain, he takes meds for it but down plays issue when you ask him about it. GI: soft, + BS, no distension now.  No tenderness.  Lab Results:   Recent Labs  05/09/13 0432 05/11/13 0440  WBC 3.5* 6.2  HGB 10.6* 11.7*  HCT 32.4* 36.6*  PLT 267 307    BMET  Recent Labs  05/09/13 0432 05/10/13 0510 05/11/13 0440  NA 132*  --  130*  K 3.8  --  4.2  CL 99  --  96  CO2 27  --  26  GLUCOSE 158*  --  200*  BUN 12  --  16  CREATININE 0.43* 0.46* 0.63  CALCIUM 7.9*  --  7.9*   PT/INR No results found for this basename: LABPROT, INR,  in the last 72 hours   Recent Labs Lab 05/05/13 0430 05/05/13 1848 05/08/13 0525 05/09/13 0432  AST 17 14 15 19   ALT 12 11 9 8   ALKPHOS 176* 162* 140* 153*  BILITOT 0.6 0.5 0.7 0.6  PROT 5.8* 5.6* 5.0* 5.2*  ALBUMIN 2.0* 1.9* 1.7* 1.7*     Lipase  No results found for this basename: lipase     Studies/Results: Dg Chest Port 1 View  05/10/2013   CLINICAL DATA:  Lung cancer and malignant right pleural effusion.  EXAM: PORTABLE CHEST - 1 VIEW  COMPARISON:  05/08/2013   FINDINGS: Mildly improved aeration at the right lung base. Stable positioning of pleural drainage catheter. Stable positioning of Port-A-Cath and nasogastric tube. No pulmonary edema is identified. The heart size is stable and within normal limits. No further pneumothorax visualized.  IMPRESSION: Mildly improved aeration of the right lung base. No further right pneumothorax.   Electronically Signed   By: Irish Lack   On: 05/10/2013 07:38    Medications: . enoxaparin (LOVENOX) injection  40 mg Subcutaneous Q24H  . famotidine (PEPCID) IV  20 mg Intravenous Q12H  . feeding supplement  1 Container Oral Q24H  . gabapentin  100 mg Oral BID  . insulin aspart  0-9 Units Subcutaneous Q4H  . mirtazapine  7.5 mg Oral QHS  . OxyCODONE  10 mg Oral Q12H  . polyethylene glycol  17 g Oral Daily  . sodium chloride  3 mL Intravenous Q12H  . tamsulosin  0.4 mg Oral BID  . terazosin  2 mg Oral QHS    Assessment/Plan 1. SBO with pancreatic cancer Stage IIIB T3 N1M0, s/p Distal pancreectomy and spleenectomy 06/2010.  2. On going Chemotherapy  3.AODM s/p pancreatectomy  4.Hypertension  5. Hypokalemia (K+ up to 3.8)  6. Large right Pleural effusiMALIGNANT CELLS CONSISTENT  WITH METASTATIC ADENOCARCINOMA.  S/p INSERTION PLEURAL DRAINAGE CATHETER(Right) With Ultrasound guidance and Fluro, 05/06/13.  Nephrolithiasis passed a stone   Plan:  He was on full liquids at home and he is back to that.  I think the issue has resolved as well as we can expect at this point.   LOS: 8 days    Oluwadara Gorman 05/11/2013

## 2013-05-11 NOTE — Progress Notes (Signed)
TNA Consult:  Received verbal order from Dr. Arbutus Leas to stop TNA and patient is ready for discharge today.    Plan: Cut TNA down to 35 ml/hr and lipid 4 ml/hr. When patient is ready to discharge, can turn TNA to OFF. RN aware to monitor patient's CBGs. Pharmacy will d/c TNA related labs (will continue CBGs and SSI for now to ensure CBGs is okay before discharge - TNA bag did contain regular insulin).   Geoffry Paradise, PharmD, BCPS Pager: [phone] 11:50 AM Pharmacy #: [phone]

## 2013-05-11 NOTE — Progress Notes (Signed)
NUTRITION FOLLOW UP  Intervention:   - Encouraged pt to continue to resume home regimen of 9 protein shakes/day and to monitor his weight and adjust his diet/shakes as needed  - Encouraged outpatient RD follow up with Vernell Leep at [facility] - Assisted pt with ordering lunch - Will continue to monitor   Nutrition Dx:   Inadequate oral intake now related to poor appetite as evidenced by need for slow diet progression - improving   Goal:   Pt to meet >/= 90% of their estimated nutrition needs; met yesterday with TPN, now goal now to meet with oral intake   Assessment:   TPN d/c today with plans to d/c pt today. Pt has been drinking 100% of [place]. Ate 25% of breakfast of clear liquid diet. Assisted pt with ordering lunch.    Height: Ht Readings from Last 1 Encounters:  05/03/13 5\' 7"  (1.702 m)    Weight Status:   Wt Readings from Last 1 Encounters:  05/03/13 144 lb 2.9 oz (65.4 kg)  Admit wt 153 lb.  Re-estimated needs:  Kcal: 1950 - 2200 Protein: 78 - 91 g Fluid: 1.7-1.9 L/day  Skin: incision on chest  Diet Order: General   Intake/Output Summary (Last 24 hours) at 05/11/13 1331 Last data filed at 05/11/13 1100  Gross per 24 hour  Intake   2190 ml  Output    800 ml  Net   1390 ml    Last BM: 9/16   Labs:   Recent Labs Lab 05/07/13 0550 05/08/13 0525 05/09/13 0432 05/10/13 0510 05/11/13 0440  NA 134* 134* 132*  --  130*  K 3.5 3.7 3.8  --  4.2  CL 101 100 99  --  96  CO2 29 28 27   --  26  BUN 9 8 12   --  16  CREATININE 0.57 0.53 0.43* 0.46* 0.63  CALCIUM 7.9* 7.9* 7.9*  --  7.9*  MG 2.1 1.9 1.9  --   --   PHOS 1.8* 1.8* 2.0*  --   --   GLUCOSE 159* 167* 158*  --  200*    CBG (last 3)   Recent Labs  05/11/13 0426 05/11/13 0742 05/11/13 1144  GLUCAP 192* 176* 187*   Triglycerides  Date/Time Value Range Status  05/09/2013  4:32 AM 72  <150 mg/dL Final     Performed at [facility]   Prealbumin  Date/Time  Value Range Status  05/09/2013  4:32 AM 4.1* 17.0 - 34.0 mg/dL Final     Performed at [facility]    Scheduled Meds: . enoxaparin (LOVENOX) injection  40 mg Subcutaneous Q24H  . famotidine (PEPCID) IV  20 mg Intravenous Q12H  . feeding supplement  1 Container Oral Q24H  . gabapentin  100 mg Oral BID  . insulin aspart  0-9 Units Subcutaneous Q4H  . mirtazapine  7.5 mg Oral QHS  . OxyCODONE  10 mg Oral Q12H  . polyethylene glycol  17 g Oral Daily  . sodium chloride  3 mL Intravenous Q12H  . tamsulosin  0.4 mg Oral BID  . terazosin  2 mg Oral QHS    Continuous Infusions: . sodium chloride 1 mL (05/09/13 1708)  . fat emulsion 192 mL (05/11/13 1200)   And  . Marland KitchenTPN (CLINIMIX-E) Adult 35 mL/hr at 05/11/13 [address] [place], [place], [place] [phone] Pager [phone] After Hours Pager

## 2013-05-11 NOTE — Progress Notes (Signed)
Pt's dtr and pt both requested to change pt's drain after discharge and at home. CM and MD were made aware. Pt's dtr was instructed to call Dr. Dennie Maizes office on Friday with daily drainage amounts. Pt's dtr [facility]) stated that she felt confident in draining and changing pt's drain.

## 2013-05-11 NOTE — Progress Notes (Signed)
Seen, agree with above.    Maintain full liquid diet.  Follow up as needed.    Ok to d/c.

## 2013-05-11 NOTE — Discharge Summary (Signed)
Physician Discharge Summary  Philip Shaw:[id number] DOB: [birth year] DOA: 05/03/2013  PCP: [name], MD  Admit date: 05/03/2013 Discharge date: 05/11/2013  Recommendations for Outpatient Follow-up:  1. Pt will need to follow up with PCP in 2 weeks post discharge 2. Please obtain BMP to evaluate electrolytes and kidney function 3. Please also check CBC to evaluate Hg and Hct levels  Discharge Diagnoses:  Principal Problem:   SBO (small bowel obstruction) Active Problems:   Malnutrition of moderate degree   Malignant pleural effusion   Hypokalemia   Anemia, chronic disease SBO  -Likely from progression of cancer--> peritoneal carcinomatosis  - Surgical consultation was obtained - The patient was initially made npo and NG tube was inserted to suction - The patient was treated conservatively without any surgical intervention -Appreciate surgical input. - The patient was placed on bowel rest and he was mobilized  - The patient was temporarily placed on TPN during admission. This was discontinued at the time of discharge - The patient's bowel function gradually returned and he passed flatus - His diet was gradually advanced and on the day of discharge the patient was tolerating full liquids which is his usual baseline diet -Has now had a few BMs.  -Abd xray shows improving SBO.  -NG has been discontinued.  -Tolerated full liquids. Will be advanced to his protein shakes today (only thing he eats at home).  -If tolerates feeds today, can DC TNA in am.  Large Exudative Right Pleural Effusion  -pulmonary was consulted and discussed options including Pleurx Cath -S/p large volume thoracentesis on 9/9 of 2 L.  -S/p pleurex catheter placement on 05/06/13 (Dr. Tyrone Sage) - The patient will be discharged home with home health nursing which will follow standard protocol to drainage his Pleurx catheter -Pleural fluid with malignant cells.  -Do not feel that clinically or by imaging he  has PNA, so will DC vanc/Cefepime.  Metastatic Pancreatic Cancer  -Stage IIIB T3 N1M0, s/p Distal pancreectomy and spleenectomy 06/2010. -Dr. Arbutus Ped followed the patient -status post several chemotherapy regimen and currently on treatment with FOLFIRI. Chemo was held for now. CT scan of the chest, abdomen and pelvis showed no significant evidence for disease progression except for the right pleural effusion which is concerning for transpleural spread of the tumor.  -Pleural fluid with malignant cells, CT with pleural and peritoneal nodules concerning for metastatic disease.  -As per oncology.  AOCD  -He is now s/p transfusion of 2 units of PRBCs with a Hb of 10.4.  - The patient's hemoglobin remained stable throughout the hospitalization after the transfusion - Hemoglobin 11.7 on the day of discharge Hypokalemia  -From NG suction.  -Repleted.  DM2 - The patient's CBGs were monitored throughout the hospitalization - For the most part, CBGs ranged 180-220 - Pt will be discharged with Lantus 5 units at bedtime - Patient was previously taking Lantus 24 units at bedtime - The patient's daughter is a RN--she was instructed to check the patient's CBGs 4 times daily and keep glycemic log which will be provided to the patient's primary care provider to make any adjustments to his Lantus. Code Status: Full Code  Family Communication: Discussed with wife and daughter at bedside.  Disposition Plan: Home  Consultants:  Surgery  Pulmonary  CVTS  Oncology  Antibiotics:  None   Discharge Condition: stable  Disposition: home  Diet:full liquids Wt Readings from Last 3 Encounters:  05/03/13 65.4 kg (144 lb 2.9 oz)  05/03/13 65.4 kg (144  lb 2.9 oz)  04/26/13 69.582 kg (153 lb 6.4 oz)    History of present illness:  78 y.o. male with prior h/o metastatic pancreatic cancer , hypertension, DM, was brought in by family for persistent nausea and vomiting. After arrival to ED, he was found  hypoxic, dyspneic, and his oxygen sats were in low 80's. His CXR revealed large right pleural effusion. He denies any chest pain or back pain. He is on 2 to3 lit of [place] oxygen with sats in 91%. He also reports left quadrant abdominal pain and adbominal distention. His abdominal films show mod degree of SBO. He is being admitted to medical service for further evaluation and management. PCCM consulted for thoracocentesis and surgery consulted for SBO.     Discharge Exam: Filed Vitals:   05/11/13 0432  BP: 115/73  Pulse: 113  Temp: 97.8 F (36.6 C)  Resp: 19   Filed Vitals:   05/09/13 2037 05/10/13 1330 05/10/13 2030 05/11/13 0432  BP: 130/70 108/50 114/67 115/73  Pulse: 98 103 106 113  Temp: 98 F (36.7 C) 97.8 F (36.6 C) 97.9 F (36.6 C) 97.8 F (36.6 C)  TempSrc: Oral Oral Axillary Oral  Resp: 18 20 19 19   Height:      Weight:      SpO2: 100% 94% 96% 96%   General: A&O x 3, NAD, pleasant, cooperative Cardiovascular: RRR, no rub, no gallop, no S3 Respiratory: CTAB, no wheeze, no rhonchi Abdomen:soft, nontender, nondistended, positive bowel sounds Extremities: No edema, No lymphangitis, no petechiae  Discharge Instructions      Discharge Orders   Future Orders Complete By Expires   Type and screen  05/04/2013 05/04/2014   Care order/instruction  As directed 05/06/2013   Comments:     Transfuse Parameters   Diet - low sodium heart healthy  As directed    Discharge instructions  As directed    Comments:     Take Lantus 5 units at bedtime.  Check sugars before each meal and at bedtime and keep sugar log.  Call family doctor if sugars are less than 70 or greater than 400   Increase activity slowly  As directed        Medication List    STOP taking these medications       tamsulosin 0.4 MG Caps capsule  Commonly known as:  FLOMAX      TAKE these medications       aspirin 81 MG tablet  Take 81 mg by mouth every morning.     gabapentin 100 MG capsule  Commonly  known as:  NEURONTIN  Take 100 mg by mouth 2 (two) times daily.     insulin glargine 100 UNIT/ML injection  Commonly known as:  LANTUS  Inject 0.05 mLs (5 Units total) into the skin at bedtime.     lidocaine 2 % solution  Commonly known as:  XYLOCAINE  TAKE BY MOUTH FOUR TIMES DAILY     lidocaine-prilocaine cream  Commonly known as:  EMLA  Apply 1 application topically as needed.     magic mouthwash Soln  Take 5 mLs by mouth QID.     mirtazapine 30 MG tablet  Commonly known as:  REMERON  TAKE 1 TABLET BY MOUTH EVERY NIGHT AT BEDTIME     ondansetron 8 MG tablet  Commonly known as:  ZOFRAN  Take 1 tablet (8 mg total) by mouth every 12 (twelve) hours as needed for nausea.     oxyCODONE 5  MG immediate release tablet  Commonly known as:  Oxy IR/ROXICODONE  Take 1 tablet (5 mg total) by mouth every 4 (four) hours as needed.     oxyCODONE 10 MG 12 hr tablet  Commonly known as:  OXYCONTIN  Take 1 tablet (10 mg total) by mouth every 12 (twelve) hours. 02/14/13-pt states he only takes one /day     oxyCODONE 5 MG immediate release tablet  Commonly known as:  Oxy IR/ROXICODONE  Take 1 tablet (5 mg total) by mouth every 4 (four) hours as needed.     ranitidine 150 MG tablet  Commonly known as:  ZANTAC  Take 150 mg by mouth 2 (two) times daily.     terazosin 2 MG capsule  Commonly known as:  HYTRIN  Take 2 mg by mouth at bedtime.     terazosin 2 MG capsule  Commonly known as:  HYTRIN  Take 1 capsule (2 mg total) by mouth at bedtime.         The results of significant diagnostics from this hospitalization (including imaging, microbiology, ancillary and laboratory) are listed below for reference.    Significant Diagnostic Studies: Dg Chest 2 View (if Patient Has Fever And/or Copd)  05/03/2013   *RADIOLOGY REPORT*  Clinical Data: Shortness of breath, vomiting  CHEST - 2 VIEW  Comparison: 03/11/2013  Findings: There is large right pleural effusion with atelectasis or  infiltrate in the right lower lobe.  Atelectasis is noted also in the right upper lobe.  There is only aeration of the upper segments of the right upper lobe.  Left lung is clear.  Right IJ Port-A-Cath with tip in SVC.  No pulmonary edema.  IMPRESSION: Large right pleural effusion with atelectasis or infiltrate in the right midlung and right lower lobe.  Left lung is clear.  No pulmonary edema.   Original Report Authenticated By: Natasha Mead, M.D.   Dg Abd 1 View  05/06/2013   CLINICAL DATA:  Small bowel obstruction, abdominal discomfort, history hypertension, diabetes  EXAM: ABDOMEN - 1 VIEW  COMPARISON:  05/05/2013  FINDINGS: Nasogastric tube projects over expected position of the duodenal C loop, tip approximately at ligament of Treitz.  Retained contrast in right colon.  Increased small bowel distention compatible with persistent small bowel obstruction.  No definite bowel wall thickening.  Bones demineralized.  Right pelvic phlebolith stable.  IMPRESSION: Persistent small bowel obstruction with increased distention of small bowel loops in the upper abdomen.   Electronically Signed   By: Ulyses Southward M.D.   On: 05/06/2013 08:03   Ct Chest W Contrast  05/04/2013   CLINICAL DATA:  Pancreatic carcinoma diagnosed 2011. Chemotherapy and radiation therapy completed. Small bowel obstruction.  EXAM: CT CHEST, ABDOMEN, AND PELVIS WITH CONTRAST  TECHNIQUE: Multidetector CT imaging of the chest, abdomen and pelvis was performed following the standard protocol during bolus administration of intravenous contrast.  CONTRAST:  OMNIPAQUE IOHEXOL 300 MG/ML  SOLN  COMPARISON:  CT 03/11/2013, plain films 05/03/2013  FINDINGS: CT CHEST FINDINGS  Port in the right anterior chest wall. No axillary or supraclavicular lymphadenopathy. No mediastinal or hilar lymphadenopathy. No pericardial fluid. There is a moderate-sized right pleural effusion which is increased in size compared to prior. There is passive atelectasis of the  right middle lobe and right lower lobe. No obstructing lesion is identified. The lung parenchyma demonstrates mild ground-glass opacities in the right lobe adjacent to the effusion which is likely atelectasis. There is some peripheral ground-glass nodules in the  left upper lobe which are new from prior (image 13). It likely infectious or inflammatory.  In the medial aspect of the right upper lobe there is nodular pleural thickening to 7 mm which is increased from prior. Additional 5 mm nodule in medial aspect of the right middle lobe is increased from prior.  CT ABDOMEN AND PELVIS FINDINGS  Again demonstrated a low-density lesion lateral aspect of the left hepatic lobe measuring 3.3 x 4.3 cm unchanged from prior. No new hepatic lesions. In pneumobilia in the nondependent left hepatic lobe. There is a NG tube extending into the gastric antrum. Patient status post Whipple procedure. No new periportal adenopathy. There is a nodule along the ventral peritoneal surface inferior to the greater curvature of stomach measuring 7 mm (image 66) which is unchanged from prior. Within the right lower abdomen there is a 12 mm nodule within the mesenteries (image 96) which is similar to 10 mm on prior but slightly increased. Small nodule within the central mesentery measuring 8 mm (image 79) increased from 5 mm on prior  The stomach is normal. Dilated loops of small bowel up to 3.5 cm in the left abdomen. The distal small bowel is reduced caliber. Contrast does flow into the distal small bowel albeit a small amount. Contrast does not reach the terminal ileum. There is a caliber change between the proximal small bowel and distal small bowel. No clear transition point is identified but most likely in the mid central abdomen. No evidence of intraperitoneal free air or pneumatosis. No portal venous gas.  The bladder is distended. Prostate gland is normal. No pelvic lymphadenopathy.  IMPRESSION:  CT CHEST IMPRESSION  Increased pleural  nodularity on the medial aspect of the right middle lobe and right upper lobe is concerning for nodular pleural metastasis.  Interval increase in pleural fluid and atelectasis in the right hemi thorax.  New ground-glass regions in the left upper lobe suggests inflammatory infectious process.  CT ABDOMEN AND PELVIS IMPRESSION  Partial mechanical small bowel obstruction with a transition point likely in the mid central abdomen. As there are several peritoneal metastases, this could be the source of the partial obstruction.  Stable hepatic metastasis in the left hepatic lobe.  No evidence of local pancreatic cancer recurrence.  Mild increase in size of several peritoneal metastasis.   Electronically Signed   By: Genevive Bi M.D.   On: 05/04/2013 17:47   Ct Abdomen Pelvis W Contrast  05/04/2013   CLINICAL DATA:  Pancreatic carcinoma diagnosed 2011. Chemotherapy and radiation therapy completed. Small bowel obstruction.  EXAM: CT CHEST, ABDOMEN, AND PELVIS WITH CONTRAST  TECHNIQUE: Multidetector CT imaging of the chest, abdomen and pelvis was performed following the standard protocol during bolus administration of intravenous contrast.  CONTRAST:  OMNIPAQUE IOHEXOL 300 MG/ML  SOLN  COMPARISON:  CT 03/11/2013, plain films 05/03/2013  FINDINGS: CT CHEST FINDINGS  Port in the right anterior chest wall. No axillary or supraclavicular lymphadenopathy. No mediastinal or hilar lymphadenopathy. No pericardial fluid. There is a moderate-sized right pleural effusion which is increased in size compared to prior. There is passive atelectasis of the right middle lobe and right lower lobe. No obstructing lesion is identified. The lung parenchyma demonstrates mild ground-glass opacities in the right lobe adjacent to the effusion which is likely atelectasis. There is some peripheral ground-glass nodules in the left upper lobe which are new from prior (image 13). It likely infectious or inflammatory.  In the medial aspect of  the right upper  lobe there is nodular pleural thickening to 7 mm which is increased from prior. Additional 5 mm nodule in medial aspect of the right middle lobe is increased from prior.  CT ABDOMEN AND PELVIS FINDINGS  Again demonstrated a low-density lesion lateral aspect of the left hepatic lobe measuring 3.3 x 4.3 cm unchanged from prior. No new hepatic lesions. In pneumobilia in the nondependent left hepatic lobe. There is a NG tube extending into the gastric antrum. Patient status post Whipple procedure. No new periportal adenopathy. There is a nodule along the ventral peritoneal surface inferior to the greater curvature of stomach measuring 7 mm (image 66) which is unchanged from prior. Within the right lower abdomen there is a 12 mm nodule within the mesenteries (image 96) which is similar to 10 mm on prior but slightly increased. Small nodule within the central mesentery measuring 8 mm (image 79) increased from 5 mm on prior  The stomach is normal. Dilated loops of small bowel up to 3.5 cm in the left abdomen. The distal small bowel is reduced caliber. Contrast does flow into the distal small bowel albeit a small amount. Contrast does not reach the terminal ileum. There is a caliber change between the proximal small bowel and distal small bowel. No clear transition point is identified but most likely in the mid central abdomen. No evidence of intraperitoneal free air or pneumatosis. No portal venous gas.  The bladder is distended. Prostate gland is normal. No pelvic lymphadenopathy.  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  Interval increase in pleural fluid and atelectasis in the right hemi thorax.  New ground-glass regions in the left upper lobe suggests inflammatory infectious process.  CT ABDOMEN AND PELVIS IMPRESSION  Partial mechanical small bowel obstruction with a  transition point likely in the mid central abdomen. As there are several peritoneal metastases, this could be the source of the partial obstruction.  Stable hepatic metastasis in the left hepatic lobe.  No evidence of local pancreatic cancer recurrence.  Mild increase in size of several peritoneal metastasis.   Electronically Signed   By: Genevive Bi M.D.   On: 05/04/2013 17:47   Dg Chest Port 1 View  05/10/2013   CLINICAL DATA:  Lung cancer and malignant right pleural effusion.  EXAM: PORTABLE CHEST - 1 VIEW  COMPARISON:  05/08/2013  FINDINGS: Mildly improved aeration at the right lung base. Stable positioning of pleural drainage catheter. Stable positioning of Port-A-Cath and nasogastric tube. No pulmonary edema is identified. The heart size is stable and within normal limits. No further pneumothorax visualized.  IMPRESSION: Mildly improved aeration of the right lung base. No further right pneumothorax.   Electronically Signed   By: Irish Lack   On: 05/10/2013 07:38   Dg Chest Port 1 View  05/08/2013   *RADIOLOGY REPORT*  Clinical Data: Postoperative evaluation chest tube  PORTABLE CHEST - 1 VIEW  Comparison: Prior chest x-ray 05/06/2013  Findings: Stable to slightly decreased right apical pneumothorax. Right thoracostomy tube remains in place.  Right IJ approach central venous catheter remains in unchanged position.  Nasogastric tube is present, the tip lies the visualized portion of the stomach.  Stable to slightly decreased right pleural effusion with associated right basilar atelectasis versus infiltrate left lower lobe.  Cardiac and mediastinal, unchanged.  Atherosclerotic calcifications  IMPRESSION: 1.  Stable to slightly decreased right apical pneumothorax.  2.  Position of support apparatus. 3.  Slightly decreased right pleural effusion with residual right lower lobe atelectasis versus infiltrate.   Original Report Authenticated By: Malachy Moan, M.D.   Dg Chest Port 1  View  05/06/2013   *RADIOLOGY REPORT*  Clinical Data: Right chest tube placement.  PORTABLE CHEST - 1 VIEW  Comparison: 05/05/2013.  Findings: Placement of right chest tube with the tip projecting over the right lung apex.  Small (5%) right apical pneumothorax.  Right-sided pleural effusion appears minimally smaller.  Underlying infiltrate or mass not excluded.  Nasogastric tube courses below the diaphragm.  The tip is not included on this exam.  Cardiomegaly.  Pulmonary vascular prominence.  Right Mediport catheter tip proximal superior vena cava level.  Calcified tortuous aorta.  IMPRESSION: Placement of right chest tube with the tip projecting over the right lung apex.  Small (5%) right apical pneumothorax.  Right-sided pleural effusion appears minimally smaller.  Please see above.  This is a call report.   Original Report Authenticated By: Lacy Duverney, M.D.   Dg Chest Port 1 View  05/05/2013   CLINICAL DATA:  Assess pleural effusion, history hypertension, diabetes, pancreatic cancer  EXAM: PORTABLE CHEST - 1 VIEW  COMPARISON:  Portable exam 0510 hr compared to 05/04/2013  FINDINGS: Right jugular Port-A-Cath stable tip projecting over SVC.  Nasogastric tube extends into stomach.  Minimal enlargement of cardiac silhouette.  Mediastinal contours normal.  Large right pleural effusion with significant atelectasis of the right lung.  Underlying infiltrate and mass not excluded with this appearance.  Minimal pulmonary vascular congestion with left lung clear.  No pneumothorax.  IMPRESSION: Persistent large right pleural effusion with significant compressive atelectasis of the right lung.   Electronically Signed   By: Ulyses Southward M.D.   On: 05/05/2013 08:02   Dg Chest Port 1 View  05/04/2013   ADDENDUM REPORT: 05/04/2013 16:37  ADDENDUM: Upon further review and consultation with the ordering physician, no endotracheal tube is present on the study. Initial dictation by Dr. Reche Dixon and addendum by Dr. Amil Amen    Electronically Signed   By: Genevive Bi M.D.   On: 05/04/2013 16:37   05/04/2013   CLINICAL DATA:  Evaluate pleural effusion. Shortness of Breath. Weakness.  EXAM: PORTABLE CHEST - 1 VIEW  COMPARISON:  None 914  FINDINGS: Endotracheal tube terminates 4.8 cm above Carina. Nasogastric tube extends beyond the inferior aspect of the film.  Right internal jugular line terminates at mid SVC.  The chin overlies the apices. Cardiomegaly, exaggerated by AP portable technique. Right sided pleural effusion is moderate and not significantly changed. The left pleural effusion. No significant change in airspace disease within the right mid and lower lung. Left lung remains clear.  IMPRESSION: No significant change in moderate right-sided pleural effusion and adjacent airspace opacity.  Interval placement of endotracheal tube, terminating 4.8 cm above Carina.  Electronically Signed: By: Jeronimo Greaves On: 05/04/2013 14:32   Dg Chest Port 1 View  05/03/2013   CLINICAL DATA:  Right pleural effusion. Status post thoracentesis.  EXAM: PORTABLE CHEST - 1 VIEW  COMPARISON:  05/03/2013  FINDINGS: A moderate right pleural effusion shows interval decrease in size. No pneumothorax identified. Right lower lung atelectasis or consolidation again demonstrated. Left lung is clear. Heart size is stable. Right-sided power port remains in appropriate position. A new nasogastric tube is seen with tip in the proximal stomach.  IMPRESSION: Decreased size of right pleural effusion. No pneumothorax identified. Persistent right lower lung  atelectasis versus consolidation.   Electronically Signed   By: Myles Rosenthal   On: 05/03/2013 19:08   Dg Abd 2 Views  05/09/2013   CLINICAL DATA:  Recent small bowel obstruction  EXAM: ABDOMEN - 2 VIEW  COMPARISON:  May 08, 2013  FINDINGS: Supine and left lateral decubitus views were obtained. Nasogastric tube tip remains in the region of the proximal jejunum. Contrast is seen throughout the colon. The  remaining loops of dilated small bowel with occasional air-fluid levels. There is no appreciable new bowel dilatation compared to recent prior study. No free air is appreciable. There are phleboliths in the pelvis.  The previously noted air-fluid level in the right upper quadrant remains. Its etiology is uncertain.  IMPRESSION: The previously noted air in the right upper quadrant which appears separate from bowel remains stable. This finding may warrant correlation with CT abdomen to further evaluate ; an abscess is a differential consideration for this air.  There remains mild bowel dilatation, stable from 1 day prior. Nasogastric tube position is unchanged. No free air is appreciated on the study.   Electronically Signed   By: Bretta Bang   On: 05/09/2013 08:43   Dg Abd 2 Views  05/08/2013   *RADIOLOGY REPORT*  Clinical Data: Abdominal distension, follow-up small bowel obstruction  ABDOMEN - 2 VIEW  Comparison: 05/06/2013  Findings: NG tube in stable position with tip in the expected position of distal jejunum or proximal jejunum and oral contrast is seen into the transverse colon with gas into the rectum.There is decreased gaseous distension of the small bowel consistent with resolving obstruction.  In the right upper quadrant on the decubitus view, there is a 4cm air-fluid level, not in anticipated position for bowel.  IMPRESSION: 1.  Improving bowel gas pattern with resolving small bowel distension.  2.  Focal 4cm air-fluid level right upper quadrant of uncertain origin.  A review of CT images 05/04/13 showed some air within the gallbladder and it is possible that the air fluid level may reflect this, although other causes would include abscess.  The finding does not appear to be related to bowel.  Follow-up with CT scanning of the abdomen suggested.   Original Report Authenticated By: Esperanza Heir, M.D.   Dg Abd Portable 1v  05/05/2013   CLINICAL DATA:  Small bowel obstruction  EXAM: PORTABLE  ABDOMEN - 1 VIEW  COMPARISON:  05/03/2013  Correlation: CT abdomen and pelvis 05/04/2013  FINDINGS: Nasogastric tube in stomach.  Oral contrast administered from prior CT is identified within the distal small bowel and colon.  However persistent dilatation of proximal small bowel loops in left abdomen compatible with persistent small bowel obstruction.  Excreted contrast within urinary bladder and renal collecting systems, with right renal collecting system slightly prominent as noted on CT.  No definite bowel wall thickening.  IMPRESSION: Persistent small bowel obstruction.   Electronically Signed   By: Ulyses Southward M.D.   On: 05/05/2013 08:07   Dg Abd Portable 2v  05/03/2013   *RADIOLOGY REPORT*  Clinical Data: Nausea, vomiting, bloating, question small bowel obstruction  PORTABLE ABDOMEN - 2 VIEW  Comparison: None Correlation:  CT abdomen pelvis 03/11/2013  Findings: Gaseous distention of stomach. Markedly distended small bowel loops up to 7.2 cm diameter. Decompressed distal small bowel loops in colon. Findings are consistent with a high-grade proximal to mid small bowel obstruction. No definite free intraperitoneal air. Bones diffusely demineralized. Nonobstructing right renal calculi.  IMPRESSION: Marked distention of proximal small  bowel loops and gaseous distention of stomach consistent with a high-grade proximal to mid small bowel obstruction.   Original Report Authenticated By: Ulyses Southward, M.D.   Dg C-arm 1-60 Min-no Report  05/06/2013   CLINICAL DATA: Pleurex catheter insertion; right chest; Maloignant pleural  effusion   C-ARM 1-60 MINUTES  Fluoroscopy was utilized by the requesting physician.  No radiographic  interpretation.      Microbiology: Recent Results (from the past 240 hour(s))  BODY FLUID CULTURE     Status: None   Collection Time    05/03/13  7:01 PM      Result Value Range Status   Specimen Description THORACENTESIS   Final   Special Requests Immunocompromised   Final   Gram  Stain     Final   Value: WBC PRESENT,BOTH PMN AND MONONUCLEAR     NO ORGANISMS SEEN     Performed by [facility]     Performed at [facility]   Culture     Final   Value: NO GROWTH 3 DAYS     Performed at [facility]   Report Status 05/07/2013 FINAL   Final  GRAM STAIN     Status: None   Collection Time    05/03/13  7:02 PM      Result Value Range Status   Specimen Description THORACENTESIS   Final   Special Requests Immunocompromised   Final   Gram Stain     Final   Value: CYTOSPIN     NO ORGANISMS SEEN     WBC PRESENT,BOTH PMN AND MONONUCLEAR     Gram Stain Report Called to,Read Back By and Verified With: S YOUNG RN 2048 05/03/13 A NAVARRO   Report Status 05/03/2013 FINAL   Final  SURGICAL PCR SCREEN     Status: None   Collection Time    05/05/13  9:48 PM      Result Value Range Status   MRSA, PCR NEGATIVE  NEGATIVE Final   Staphylococcus aureus NEGATIVE  NEGATIVE Final   Comment:            The Xpert SA Assay (FDA     approved for NASAL specimens     in patients over 43 years of age),     is one component of     a comprehensive surveillance     program.  Test performance has     been validated by [employer] for patients greater     than or equal to 88 year old.     It is not intended     to diagnose infection nor to     guide or monitor treatment.     Labs: Basic Metabolic Panel:  Recent Labs Lab 05/05/13 1848 05/07/13 0550 05/08/13 0525 05/09/13 0432 05/10/13 0510 05/11/13 0440  NA 133* 134* 134* 132*  --  130*  K 3.3* 3.5 3.7 3.8  --  4.2  CL 95* 101 100 99  --  96  CO2 35* 29 28 27   --  26  GLUCOSE 92 159* 167* 158*  --  200*  BUN 18 9 8 12   --  16  CREATININE 0.86 0.57 0.53 0.43* 0.46* 0.63  CALCIUM 7.8* 7.9* 7.9* 7.9*  --  7.9*  MG  --  2.1 1.9 1.9  --   --   PHOS  --  1.8* 1.8* 2.0*  --   --    Liver Function Tests:  Recent Labs Lab 05/05/13 0430 05/05/13 1848 05/08/13 0525 05/09/13 0432  AST 17 14 15 19    ALT 12 11 9 8   ALKPHOS 176* 162* 140* 153*  BILITOT 0.6 0.5 0.7 0.6  PROT 5.8* 5.6* 5.0* 5.2*  ALBUMIN 2.0* 1.9* 1.7* 1.7*   No results found for this basename: LIPASE, AMYLASE,  in the last 168 hours No results found for this basename: AMMONIA,  in the last 168 hours CBC:  Recent Labs Lab 05/05/13 1848 05/07/13 0550 05/08/13 0525 05/09/13 0432 05/11/13 0440  WBC 2.0* 1.9* 2.5* 3.5* 6.2  NEUTROABS  --   --  0.9* 1.0*  --   HGB 10.3* 10.3* 10.6* 10.6* 11.7*  HCT 30.4* 31.8* 33.1* 32.4* 36.6*  MCV 87.1 91.6 90.4 91.5 91.5  PLT 266 227 272 267 307   Cardiac Enzymes: No results found for this basename: CKTOTAL, CKMB, CKMBINDEX, TROPONINI,  in the last 168 hours BNP: No components found with this basename: POCBNP,  CBG:  Recent Labs Lab 05/10/13 2023 05/11/13 0010 05/11/13 0426 05/11/13 0742 05/11/13 1144  GLUCAP 174* 210* 192* 176* 187*    Time coordinating discharge:  Greater than 30 minutes  Signed:  Gordie Belvin, DO [facility] Pager: [phone] 05/11/2013, 1:31 PM

## 2013-05-12 ENCOUNTER — Other Ambulatory Visit: Payer: Self-pay | Admitting: Medical Oncology

## 2013-05-12 ENCOUNTER — Telehealth: Payer: Self-pay | Admitting: Medical Oncology

## 2013-05-12 NOTE — Progress Notes (Signed)
Referred pt to [facility]

## 2013-05-12 NOTE — Telephone Encounter (Signed)
Daughter called to say Dr Arbutus Ped needs to refer pt to [facility]. I called [facility] and made referral and sent note to med records to fax chart to [phone] attention Kathie Rhodes

## 2013-05-13 ENCOUNTER — Telehealth: Payer: Self-pay | Admitting: Internal Medicine

## 2013-05-13 ENCOUNTER — Telehealth: Payer: Self-pay | Admitting: *Deleted

## 2013-05-13 NOTE — Telephone Encounter (Signed)
FAXED PT MEDICAL RECORDS TO [facility]

## 2013-05-13 NOTE — Telephone Encounter (Signed)
On discharge from the hospital, Mr. Philip Shaw went to stay with his daughter in [place].  He has a pleurX and she is managing the drainage.  She called to relay the amounts obtained  Wednesday, Thursday and today.....400/200/250 cc.  I  Informed Dr. Tyrone Sage of the same and he said to reduce the drainage schedule to every other day.  I called his daughter and told her to drain the catheter on M-W-F.  She agreed.

## 2013-05-16 ENCOUNTER — Telehealth: Payer: Self-pay | Admitting: Internal Medicine

## 2013-05-16 NOTE — Telephone Encounter (Signed)
Pt appt. With Dr. Dell Ponto @ the [facility] is 05/17/13@10 :30. Pt dtr. Is aware

## 2013-05-30 ENCOUNTER — Telehealth: Payer: Self-pay | Admitting: *Deleted

## 2013-05-30 NOTE — Telephone Encounter (Signed)
FMLA paperwork from [employer] for Philip Shaw given to Axel Filler to review.  SLJ

## 2013-05-31 ENCOUNTER — Encounter: Payer: Self-pay | Admitting: Internal Medicine

## 2013-05-31 NOTE — Progress Notes (Signed)
Put fmla form on nurse's desk °

## 2013-06-02 ENCOUNTER — Encounter: Payer: Self-pay | Admitting: Internal Medicine

## 2013-06-02 NOTE — Progress Notes (Signed)
Faxed son's fmla form to [place] @ [phone].

## 2013-06-25 DEATH — deceased

## 2014-04-20 ENCOUNTER — Encounter: Payer: Self-pay | Admitting: *Deleted

## 2014-04-20 ENCOUNTER — Other Ambulatory Visit: Payer: Self-pay | Admitting: *Deleted

## 2014-04-20 NOTE — Progress Notes (Signed)
Deceased patient. Cancelled treatment orders.

## 2014-09-14 IMAGING — CT CT CHEST W/ CM
2 of 4 series · 15 of 46 positions shown, 17 images · IV contrast (OMNIPAQUE)
Comparison: Multiple exams, including 11/08/2012

CT CHEST

CLINICAL DATA: Pancreatic cancer.  Ongoing chemotherapy.  40%
pancreatectomy.  Completed radiotherapy.

CT CHEST, ABDOMEN AND PELVIS WITH CONTRAST
TECHNIQUE: Multidetector CT imaging of the chest, abdomen and
pelvis was performed following the standard protocol during bolus
administration of intravenous contrast.
Contrast: 100mL OMNIPAQUE IOHEXOL 300 MG/ML  SOLN

[Series 2: cap with st · axial · 0.76mm/px · z∈[-572,-6]mm · 12 of 125 slices shown, 14 images]
[im 6/125  soft-tissue]
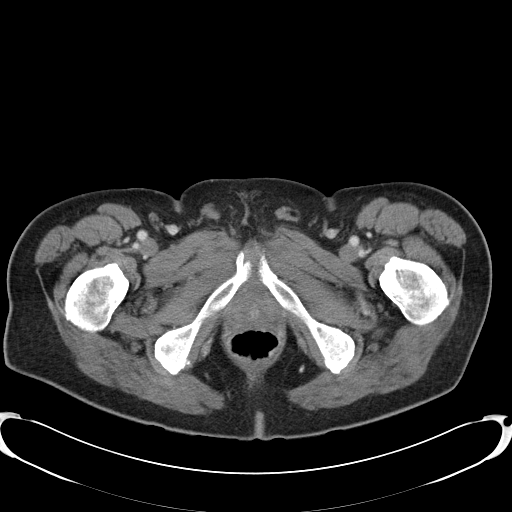
[im 6/125  bone]
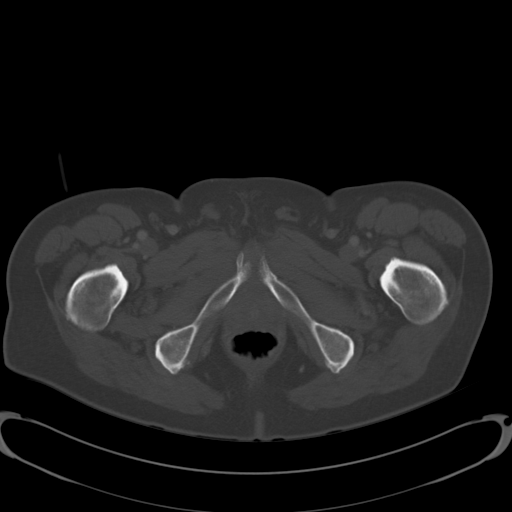
[im 16/125  soft-tissue]
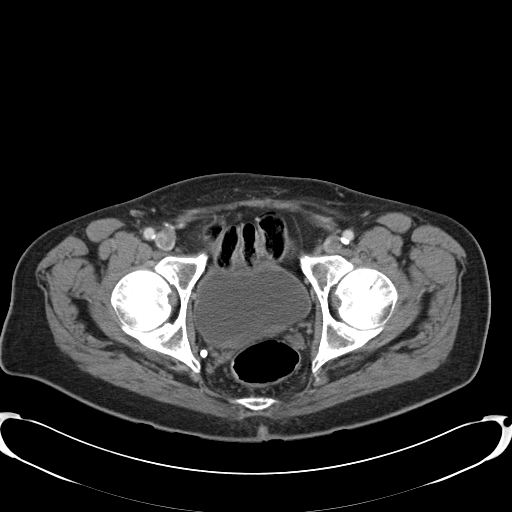
[im 26/125  soft-tissue]
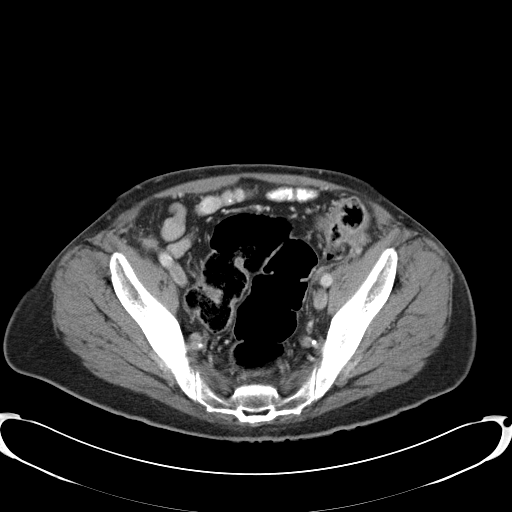
[im 37/125  soft-tissue]
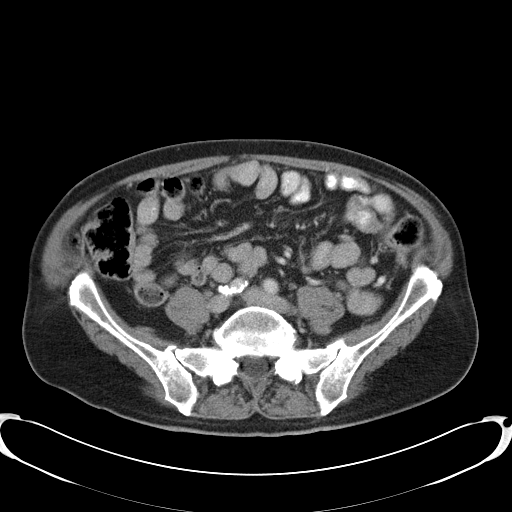
[im 47/125  soft-tissue]
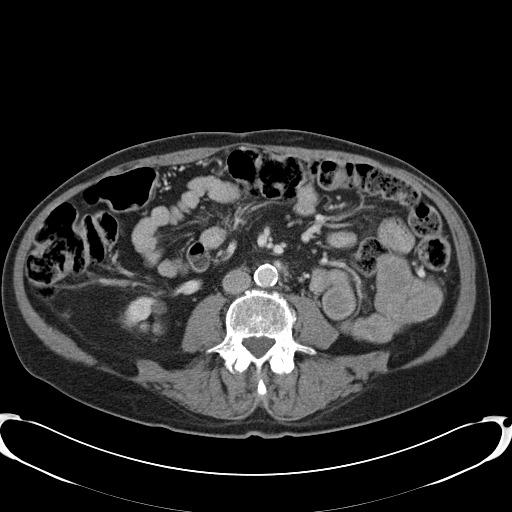
[im 57/125  soft-tissue]
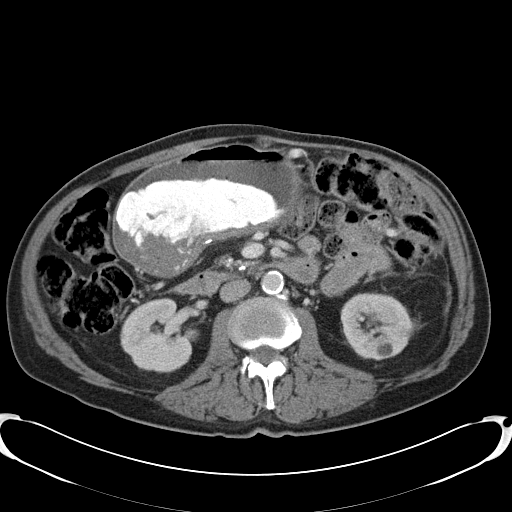
[im 68/125  soft-tissue]
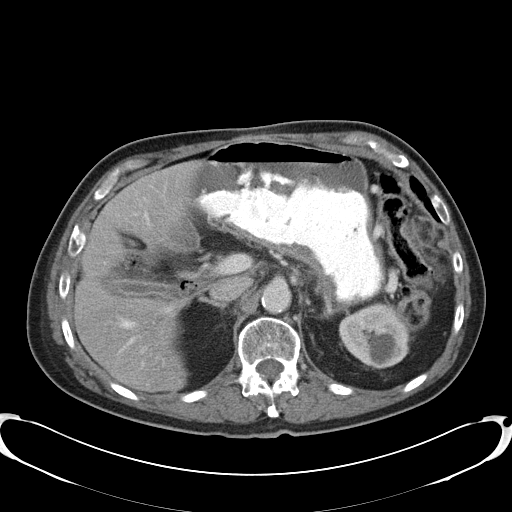
[im 78/125  soft-tissue]
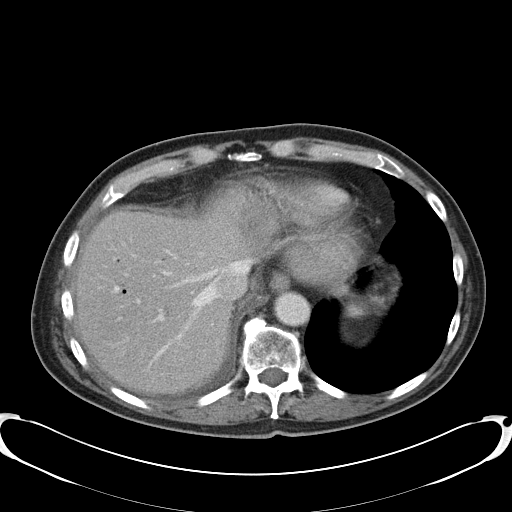
[im 88/125  soft-tissue]
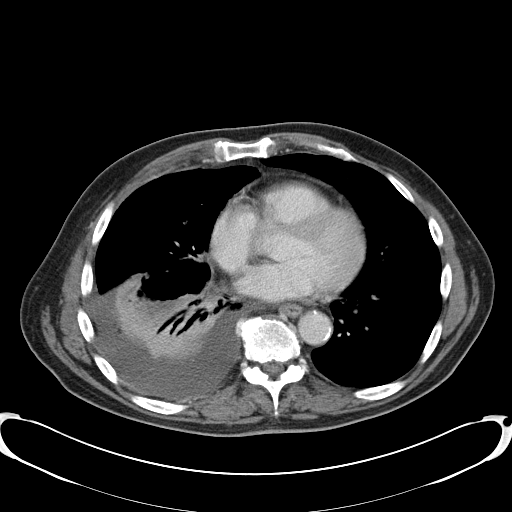
[im 88/125  bone]
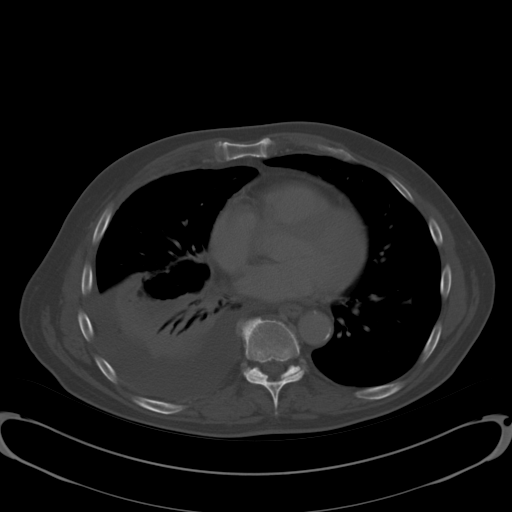
[im 99/125  soft-tissue]
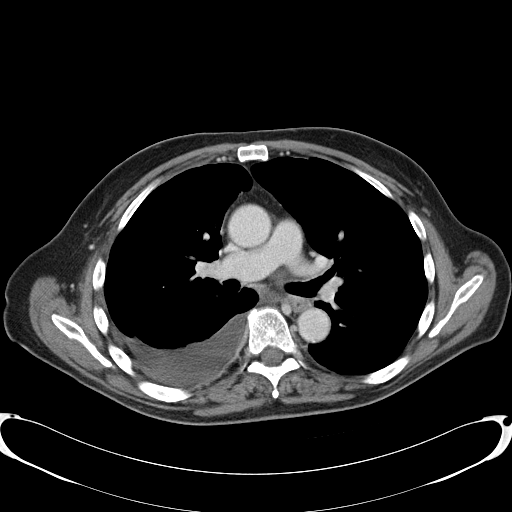
[im 109/125  soft-tissue]
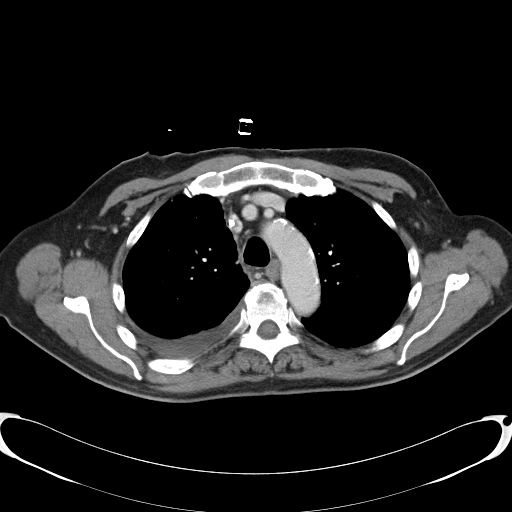
[im 119/125  soft-tissue]
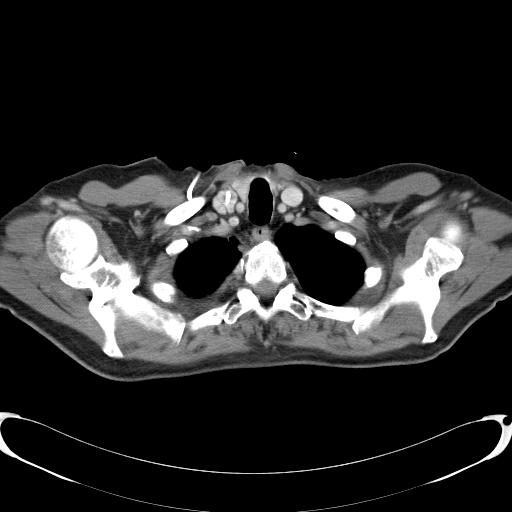

[Series 602: <mpr thick range> · coronal · 1.22mm/px · 3 of 86 slices shown]
[im 29/86  soft-tissue]
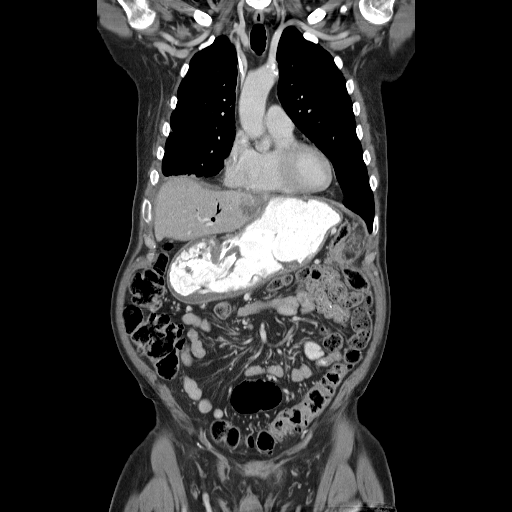
[im 38/86  soft-tissue]
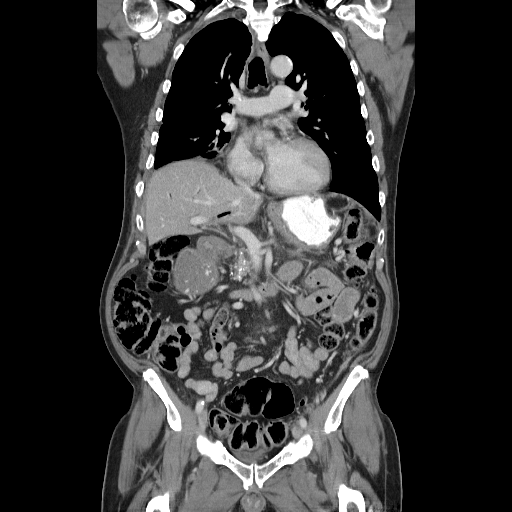
[im 48/86  soft-tissue]
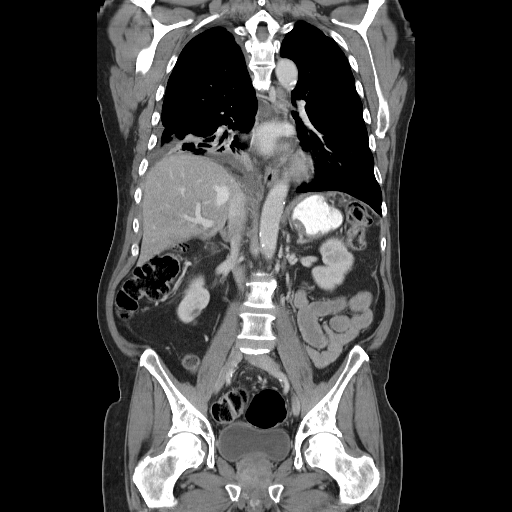

[15 of 46 positions shown; findings below may reference images not displayed]

FINDINGS: Right IJ line tip:  SVC.

Moderate sized right pleural effusion noted, likely exudative - I
cannot exclude malignant effusion given the slightly nodular
appearance along the right hemidiaphragmatic margin posteriorly.
There is subtle nodularity along the right-sided fissures.

No pathologic thoracic adenopathy.  Atherosclerotic calcification
the patient in the proximal left anterior descending coronary
artery noted.    Small calcified granuloma noted in the left lower
lobe posteriorly.
IMPRESSION: 1.  New moderate right sided pleural effusion with subtle
nodularity along the right hemidiaphragm and along the fissures - I
cannot exclude a malignant pleural effusion.
2.  Atherosclerosis.

CT ABDOMEN AND PELVIS
FINDINGS: Heterogeneous enhancing mass in segment two of the
liver, 3.8 x 3.8 cm, formerly 3.2 x 3.4 cm. Stable pneumobilia.
Contracted gallbladder.  Spleen absent.  Distal pancreatectomy
noted with scattered calcifications in dilated duct in the
remaining segment of the pancreatic body and head, along with
severe parenchymal atrophy.

Distended stomach.  Spleen absent.  Continued infiltration of the
left omentum with stranding around lobulations of adipose tissue,
query fat necrosis or tumor infiltration.

Adrenal glands unremarkable.  Hypodense renal lesions noted; some
of these including the 1.0 x 0.7 cm right kidney upper pole lesion
on image 63 of series 2 and the bilobed 2.7 x 1.6 cm lesion in the
right kidney lower pole have complex elements.

A 3 mm right kidney upper pole nonobstructive calculus.  4 mm and
separate 6 mm right kidney lower pole nonobstructive calculus.
Left kidney lower pole 3 mm nonobstructive calculus.  No
hydronephrosis or hydroureter.  Urinary bladder unremarkable.
Prominent central prostate gland indents the bladder base.

Soft tissue density/scarring along the anterior renal
fascia/anterior pararenal space is stable.  No dilated small bowel.

Descending and sigmoid colon diverticulosis noted with wall
thickening in the proximal sigmoid colon.  Aortoiliac
atherosclerotic calcification is present.
IMPRESSION: 1.  Enlarging metastatic lesion in segment two of the liver.
2.  Essentially stable appearance of stranding/infiltrative process
in the left omentum, and soft tissue density along the anterior
renal fascia/anterior para renal space.  Some of this may be
postoperative fat necrosis and scarring, although monitoring is
likely warranted.
3.  Bilateral hypodense renal lesions.  Some of the left renal
lesions are complex.  If the patient has hematuria or if further
workup is warranted in light of the patient's overall clinical
situation, renal protocol MRI of the abdomen with and without
contrast would be the best way work these out to exclude small foci
of renal cell carcinoma.
4.  Focal wall thickening in the proximal sigmoid colon could be
related to the diverticulosis, but I cannot exclude a sigmoid colon
malignancy.  Consider colonoscopy if feasible and if not recently
performed.
5.  Bilateral nonobstructive nephrolithiasis.
6.  Gastric distention.

## 2022-07-15 ENCOUNTER — Other Ambulatory Visit (HOSPITAL_COMMUNITY): Payer: Self-pay
